# Patient Record
Sex: Male | Born: 1945
Health system: Southern US, Community
[De-identification: ages and names within clinical notes are randomized; demographics above are authoritative.]

## PROBLEM LIST (undated history)

## (undated) ENCOUNTER — Ambulatory Visit: Source: Home / Self Care

## (undated) DIAGNOSIS — G4733 Obstructive sleep apnea (adult) (pediatric): Secondary | ICD-10-CM

## (undated) DIAGNOSIS — I82409 Acute embolism and thrombosis of unspecified deep veins of unspecified lower extremity: Secondary | ICD-10-CM

## (undated) DIAGNOSIS — K859 Acute pancreatitis without necrosis or infection, unspecified: Secondary | ICD-10-CM

## (undated) DIAGNOSIS — I50810 Right heart failure, unspecified: Secondary | ICD-10-CM

## (undated) DIAGNOSIS — I272 Pulmonary hypertension, unspecified: Secondary | ICD-10-CM

## (undated) DIAGNOSIS — R0600 Dyspnea, unspecified: Secondary | ICD-10-CM

## (undated) DIAGNOSIS — K219 Gastro-esophageal reflux disease without esophagitis: Secondary | ICD-10-CM

## (undated) DIAGNOSIS — I10 Essential (primary) hypertension: Secondary | ICD-10-CM

## (undated) HISTORY — DX: Essential (primary) hypertension: I10

## (undated) HISTORY — PX: COLONOSCOPY: SHX174

## (undated) HISTORY — DX: Acute embolism and thrombosis of unspecified deep veins of unspecified lower extremity: I82.409

## (undated) HISTORY — DX: Obstructive sleep apnea (adult) (pediatric): G47.33

## (undated) HISTORY — PX: OTHER SURGICAL HISTORY: SHX169

## (undated) HISTORY — PX: LUMBAR LAMINECTOMY: SHX95

## (undated) HISTORY — DX: Right heart failure, unspecified: I50.810

## (undated) HISTORY — DX: Pulmonary hypertension, unspecified: I27.20

---

## 1996-07-28 HISTORY — PX: KNEE SURGERY: SHX244

## 1999-04-05 ENCOUNTER — Encounter: Payer: Self-pay | Admitting: Neurological Surgery

## 1999-04-09 ENCOUNTER — Inpatient Hospital Stay (HOSPITAL_COMMUNITY): Admission: RE | Admit: 1999-04-09 | Discharge: 1999-04-16 | Payer: Self-pay | Admitting: Neurological Surgery

## 1999-04-09 ENCOUNTER — Encounter: Payer: Self-pay | Admitting: Neurological Surgery

## 1999-04-09 HISTORY — PX: BACK SURGERY: SHX140

## 1999-04-10 ENCOUNTER — Encounter: Payer: Self-pay | Admitting: Neurological Surgery

## 1999-05-29 ENCOUNTER — Encounter: Payer: Self-pay | Admitting: Neurological Surgery

## 1999-05-29 ENCOUNTER — Encounter: Admission: RE | Admit: 1999-05-29 | Discharge: 1999-05-29 | Payer: Self-pay | Admitting: Neurological Surgery

## 1999-06-28 ENCOUNTER — Encounter: Payer: Self-pay | Admitting: Neurological Surgery

## 1999-06-28 ENCOUNTER — Encounter: Admission: RE | Admit: 1999-06-28 | Discharge: 1999-06-28 | Payer: Self-pay | Admitting: Neurological Surgery

## 1999-08-07 ENCOUNTER — Encounter: Admission: RE | Admit: 1999-08-07 | Discharge: 1999-08-07 | Payer: Self-pay | Admitting: Neurological Surgery

## 1999-08-07 ENCOUNTER — Encounter: Payer: Self-pay | Admitting: Neurological Surgery

## 1999-08-20 ENCOUNTER — Encounter: Admission: RE | Admit: 1999-08-20 | Discharge: 1999-11-18 | Payer: Self-pay | Admitting: Neurological Surgery

## 2000-02-12 ENCOUNTER — Encounter: Admission: RE | Admit: 2000-02-12 | Discharge: 2000-02-12 | Payer: Self-pay | Admitting: Neurological Surgery

## 2000-02-12 ENCOUNTER — Encounter: Payer: Self-pay | Admitting: Neurological Surgery

## 2000-05-06 ENCOUNTER — Encounter: Admission: RE | Admit: 2000-05-06 | Discharge: 2000-05-06 | Payer: Self-pay | Admitting: Neurological Surgery

## 2000-05-06 ENCOUNTER — Encounter: Payer: Self-pay | Admitting: Neurological Surgery

## 2001-03-08 ENCOUNTER — Ambulatory Visit (HOSPITAL_COMMUNITY): Admission: RE | Admit: 2001-03-08 | Discharge: 2001-03-08 | Payer: Self-pay | Admitting: Family Medicine

## 2001-03-08 ENCOUNTER — Encounter: Payer: Self-pay | Admitting: Family Medicine

## 2002-12-12 ENCOUNTER — Encounter: Admission: RE | Admit: 2002-12-12 | Discharge: 2002-12-12 | Payer: Self-pay | Admitting: Family Medicine

## 2005-02-07 ENCOUNTER — Encounter (INDEPENDENT_AMBULATORY_CARE_PROVIDER_SITE_OTHER): Payer: Self-pay | Admitting: Specialist

## 2005-02-07 ENCOUNTER — Ambulatory Visit (HOSPITAL_COMMUNITY): Admission: RE | Admit: 2005-02-07 | Discharge: 2005-02-07 | Payer: Self-pay | Admitting: *Deleted

## 2005-06-09 ENCOUNTER — Ambulatory Visit: Payer: Self-pay | Admitting: Pulmonary Disease

## 2005-06-12 ENCOUNTER — Ambulatory Visit (HOSPITAL_BASED_OUTPATIENT_CLINIC_OR_DEPARTMENT_OTHER): Admission: RE | Admit: 2005-06-12 | Discharge: 2005-06-12 | Payer: Self-pay | Admitting: Pulmonary Disease

## 2005-06-24 ENCOUNTER — Ambulatory Visit: Payer: Self-pay | Admitting: Pulmonary Disease

## 2005-06-25 ENCOUNTER — Ambulatory Visit: Payer: Self-pay | Admitting: Pulmonary Disease

## 2005-06-30 ENCOUNTER — Ambulatory Visit (HOSPITAL_COMMUNITY): Admission: RE | Admit: 2005-06-30 | Discharge: 2005-06-30 | Payer: Self-pay | Admitting: Family Medicine

## 2005-08-12 ENCOUNTER — Ambulatory Visit: Payer: Self-pay | Admitting: Pulmonary Disease

## 2006-09-24 DIAGNOSIS — G4733 Obstructive sleep apnea (adult) (pediatric): Secondary | ICD-10-CM | POA: Insufficient documentation

## 2006-09-24 DIAGNOSIS — F329 Major depressive disorder, single episode, unspecified: Secondary | ICD-10-CM

## 2006-09-24 DIAGNOSIS — M545 Low back pain: Secondary | ICD-10-CM | POA: Insufficient documentation

## 2007-07-24 ENCOUNTER — Inpatient Hospital Stay (HOSPITAL_COMMUNITY): Admission: EM | Admit: 2007-07-24 | Discharge: 2007-07-27 | Payer: Self-pay | Admitting: Emergency Medicine

## 2007-07-27 ENCOUNTER — Ambulatory Visit: Payer: Self-pay | Admitting: Vascular Surgery

## 2008-03-23 ENCOUNTER — Encounter (INDEPENDENT_AMBULATORY_CARE_PROVIDER_SITE_OTHER): Payer: Self-pay | Admitting: Family Medicine

## 2008-03-23 ENCOUNTER — Ambulatory Visit: Payer: Self-pay | Admitting: Vascular Surgery

## 2008-03-23 ENCOUNTER — Ambulatory Visit (HOSPITAL_COMMUNITY): Admission: RE | Admit: 2008-03-23 | Discharge: 2008-03-23 | Payer: Self-pay | Admitting: Family Medicine

## 2008-03-24 ENCOUNTER — Inpatient Hospital Stay (HOSPITAL_COMMUNITY): Admission: EM | Admit: 2008-03-24 | Discharge: 2008-03-30 | Payer: Self-pay | Admitting: Emergency Medicine

## 2008-10-30 ENCOUNTER — Ambulatory Visit: Payer: Self-pay | Admitting: Surgery

## 2009-03-20 ENCOUNTER — Encounter: Admission: RE | Admit: 2009-03-20 | Discharge: 2009-03-20 | Payer: Self-pay | Admitting: Family Medicine

## 2010-04-14 ENCOUNTER — Emergency Department (HOSPITAL_COMMUNITY): Admission: EM | Admit: 2010-04-14 | Discharge: 2010-04-14 | Payer: Self-pay | Admitting: Emergency Medicine

## 2010-05-10 ENCOUNTER — Encounter: Admission: RE | Admit: 2010-05-10 | Discharge: 2010-05-10 | Payer: Self-pay | Admitting: Family Medicine

## 2010-05-23 ENCOUNTER — Encounter: Admission: RE | Admit: 2010-05-23 | Discharge: 2010-05-23 | Payer: Self-pay | Admitting: Family Medicine

## 2010-05-24 ENCOUNTER — Encounter: Admission: RE | Admit: 2010-05-24 | Discharge: 2010-05-24 | Payer: Self-pay | Admitting: Family Medicine

## 2010-06-06 ENCOUNTER — Ambulatory Visit (HOSPITAL_COMMUNITY)
Admission: RE | Admit: 2010-06-06 | Discharge: 2010-06-06 | Payer: Self-pay | Source: Home / Self Care | Admitting: Gastroenterology

## 2010-10-10 LAB — POCT CARDIAC MARKERS
CKMB, poc: 2.6 ng/mL (ref 1.0–8.0)
Myoglobin, poc: 235 ng/mL (ref 12–200)
Troponin i, poc: <0.05 ng/mL (ref 0.00–0.09)

## 2010-10-10 LAB — DIFFERENTIAL
Basophils Absolute: 0 10*3/uL (ref 0.0–0.1)
Basophils Relative: 0 % (ref 0–1)
Eosinophils Relative: 2 % (ref 0–5)
Monocytes Absolute: 0.5 10*3/uL (ref 0.1–1.0)
Monocytes Relative: 8 % (ref 3–12)

## 2010-10-10 LAB — COMPREHENSIVE METABOLIC PANEL
AST: 35 U/L (ref 0–37)
CO2: 27 mEq/L (ref 19–32)
Chloride: 106 mEq/L (ref 96–112)
Sodium: 138 mEq/L (ref 135–145)

## 2010-10-10 LAB — CBC
HCT: 41.2 % (ref 39.0–52.0)
RDW: 14.5 % (ref 11.5–15.5)
WBC: 6 10*3/uL (ref 4.0–10.5)

## 2010-10-10 LAB — D-DIMER, QUANTITATIVE: D-Dimer, Quant: 0.69 ug/mL-FEU — ABNORMAL HIGH (ref 0.00–0.48)

## 2010-12-10 NOTE — Op Note (Signed)
NAMELAVONNE, CASS                ACCOUNT NO.:  [id number]   MEDICAL RECORD NO.:  [id number]          PATIENT TYPE:  INP   LOCATION:  1313                         FACILITY:  [facility]   PHYSICIAN:  Ollen Gross. Vernell Morgans, M.D. DATE OF BIRTH:  Jan 07, 1946   DATE OF PROCEDURE:  07/25/2007  DATE OF DISCHARGE:                               OPERATIVE REPORT   PREOPERATIVE DIAGNOSIS:  Right gluteal abscess.   POSTOPERATIVE DIAGNOSIS:  Right gluteal abscess.   PROCEDURE:  I&D of right gluteal abscess.   SURGEON:  Ollen Gross. Vernell Morgans, M.D.   ANESTHESIA:  General endotracheal.   PROCEDURE:  After informed consent was obtained, the patient was brought  to the operating room and placed in the supine position on operating  room table.  After adequate induction of general anesthesia, the patient  was placed in lithotomy position.  His perirectal area was then prepped  with Betadine and draped in the usual sterile manner.  The patient had a  large abscess on the right gluteal fold area.  This area was probed with  a hemostat.  Abscess cavity was able to be identified.  Cultures were  taken.  The cavity was then opened up sharply with the electrocautery.  The cavity was cleaned with gauze.  Hemostasis was achieved using the  Bovie electrocautery.  The wound was infiltrated with 0.25% Marcaine  with epinephrine.  The wound was then packed with a moistened 4x4 gauze,  and sterile dressings were applied.  The patient tolerated the procedure  well.  At the end of the case. all needle, sponge, and instrument counts  were correct.  The patient was then awakened and taken to the recovery  room in stable condition sanitation.      Ollen Gross. Vernell Morgans, M.D.  Electronically Signed     PST/MEDQ  D:  07/25/2007  T:  07/26/2007  Job:  [id number]

## 2010-12-10 NOTE — H&P (Signed)
Richard Sandoval, Richard Sandoval NO.:  [id number]   MEDICAL RECORD NO.:  [id number]          PATIENT TYPE:  EMS   LOCATION:  ED                           FACILITY:  [facility]   PHYSICIAN:  Deirdre Peer. Polite, M.D. DATE OF BIRTH:  09-13-1945   DATE OF ADMISSION:  07/23/2007  DATE OF DISCHARGE:                              HISTORY & PHYSICAL   CHIEF COMPLAINT:  Nausea and vomiting.   HISTORY OF PRESENT ILLNESS:  A 66 year old male with a history of  obesity, sleep apnea presents to the ED with the above complaint of  nausea and vomiting x2 days. The patient stated he was in his usual  health until approximately 4-5 days ago he had upper respiratory  symptoms as if he had a cold. He started to feel a little better there  before he ate Christmas dinner and thinks he may have ate a little too  much. Since then he has been having nausea and vomiting x2 days. He  denied any fever or chills, denied any blood in the emesis. No other  family members sick. He has been unable to keep much down other than  ginger ale. He has noticed a slight change in his stool. Also of note,  he had a boil on his right buttocks which has been there for 4 days. He  denies having any boils of MRSA in the past. In the ED, he was  evaluated, the patient was afebrile, pulse 111, respiratory rate of 22,  sat 96%. The last order showed a white count of 14, 78% neutrophils,  BMET within normal limits. The patient did not have any abdominal series  or UA there or was not ID. Medicine was called for admission. At the  time of my evaluation, the patient was alert and oriented in no apparent  distress. He still feels a little queasy with significant tenderness  around the boil in his right gluteal area.   PAST MEDICAL HISTORY:  As stated above.   MEDICATIONS ON ADMISSION:  None.   SOCIAL HISTORY:  Negative for tobacco, alcohol or drugs.   PAST SURGICAL HISTORY:  Low back surgery in 2000, knee surgery in 1991.   ALLERGIES:  PENICILLIN.   FAMILY HISTORY:  Noncontributory.   REVIEW OF SYSTEMS:  As stated in the HPI.   PHYSICAL EXAMINATION:  Temperature 98.7, blood pressure 162/98, pulse  111, respiratory rate of 22, sat 96%.  HEENT:  Unremarkable.  CHEST:  Clear without rales, rhonchi or rub.  CARDIOVASCULAR:  Regular, S1, S2. No S3 appreciated.  ABDOMEN:  Soft, nontender, no reproducible pain.  EXTREMITIES:  No edema. In the right gluteal area the patient does have  a boil/abscess approximately 2.5 x 3 cm.  NEUROLOGIC:  Nonfocal.   ASSESSMENT:  1. Nausea and vomiting.  2. Recent upper respiratory infection.  3. Leukocytosis.  4. White gluteal boil/abscess.  5. Obesity.  6. Sleep apnea.   I recommend the patient be admitted to a medicine floor bed. He will be  provided with analgesia. He will have followup CBC. Will start the  patient on  antibiotics for boil and determine in a.m. if I&D is  required. Will obtain an abdominal series as well as a UA, C&S. Will  make further recommendations after review of the above studies.      Deirdre Peer. Polite, M.D.  Electronically Signed     RDP/MEDQ  D:  07/24/2007  T:  07/24/2007  Job:  [id number]

## 2010-12-10 NOTE — Consult Note (Signed)
NAMEMarland Kitchen  EARNIE, BECHARD NO.:  [id number]   MEDICAL RECORD NO.:  [id number]          PATIENT TYPE:  INP   LOCATION:  1313                         FACILITY:  [facility]   PHYSICIAN:  Lennie Muckle, MD      DATE OF BIRTH:  Oct 01, 1945   DATE OF CONSULTATION:  07/24/2007  DATE OF DISCHARGE:                                 CONSULTATION   Richard Sandoval is a 65 year old male who was admitted to the medicine  service on July 23, 2007, due to nausea and vomiting for 2 days.  Apparently during his hospitalization, he had complaints of a boil on  his right buttock which had been present for approximately 4 days.  He  has had no previous problems with lesions in the past.  He has been  treated with doxycycline 100 mg p.o. b.i.d.  It was felt today that he  may receive benefit from incision and drainage of the area.  Apparently  he had a spontaneous rupture of the area today with warm compresses and  feels much better at the time of my visit.  He has had no fevers or  chills and his white count is mildly elevated at 14.8.   PAST MEDICAL HISTORY:  1. Sleep apnea.  2. Morbid obesity.   He takes no medications at home.   SOCIAL HISTORY:  No tobacco or alcohol use.   PAST SURGICAL HISTORY:  1. Low back surgery in 2000.  2. Knee surgery in 1991.   ALLERGIES:  PENICILLIN.   REVIEW OF SYSTEMS:  As per the patient's chart.  No pertinent positives  are seen.   PHYSICAL EXAMINATION:  GENERAL:  A pleasant white male in no acute  distress, laying in bed.  He does have his BiPAP machine on.  VITAL SIGNS:  Temperature is 97.5, blood pressure 158/95, O2 sat is 98%.  MUSCULOSKELETAL:  Focused examination of the gluteal area on the right:  There is an approximately 6-cm area of induration on the gluteal fold.  There is a smaller area approximately 2-cm in size with some skin  blistering with loose skin.  There is area of fluctuance that I can  ascertain at the present time.  There is a  small amount of drainage on a  gauze pad which has been placed over the area.  He is tender to  palpation over the vicinity.  There is no notable calor.   ASSESSMENT:  Left gluteal abscess with spontaneous rupture being covered  with doxycycline.   PLAN:  I think since Richard Sandoval has had spontaneous rupture of the  lesion and feels better that nothing further needs to be done at the  present time.  The area does continue to have some induration which  should get better with time.  We will re-evaluate in the morning to see if anything has changed or if  he does need incision and drainage but I believe fully that he will  recover from this without any further intervention needed.   It has been a pleasure being able to services in the care of  Richard Sandoval.      Lennie Muckle, MD  Electronically Signed     ALA/MEDQ  D:  07/24/2007  T:  07/25/2007  Job:  [id number]

## 2010-12-10 NOTE — Assessment & Plan Note (Signed)
OFFICE VISIT   Richard Sandoval, Richard Sandoval  DOB:  Nov 26, 1945                                       10/30/2008  CHART#:[id number]   REASON FOR VISIT:  Swelling status post DVT.   HISTORY:  This is a 65 year old gentleman I am seeing at the request of  Laurann Montana for evaluation of stasis ulcer on his leg.  Back in  October 2009, the patient developed a left leg deep vein thrombosis.  The patient stated that he had been in an prolonged sitting position on  a car ride that was likely the etiology of his DVT.  He had subsequently  been placed on Coumadin.  He did develop a staph infection and open  wound which has now healed.  He does complain of some swelling in his  left leg.  At the end of the day he says it is three times the size of  his right leg.   PAST MEDICAL HISTORY:  Significant for obesity.   PAST SURGICAL HISTORY:  Back fusion in 2000 and left knee surgery.   FAMILY HISTORY:  Negative for cardiovascular disease.   SOCIAL HISTORY:  He is married.  He is retired.  Does not smoke.  Has  never smoked.  He does not drink alcohol.   REVIEW OF SYSTEMS:  GENERAL:  He has no fevers, chills, he has a history  of weight gain.  He now weighs 430 pounds.  CARDIAC:  Positive for chest tightness, shortness of breath when lying  flat, shortness of breath with exertion.  PULMONARY:  Positive for wheezing.  GI:  Negative.  GU:  Frequent urination.  VASCULAR:  Positive for pain in legs when walking and when lying flat.  History of blood clot in his vein.  NEURO:  Positive for headaches.  ORTHO:  Positive for arthritis, joint pain, muscle pain.  PSYCH:  Positive for depression.  ENT:  Negative.  HEME:  Negative.   MEDICATIONS:  Include tramadol, Coumadin, doxycycline.   ALLERGIES:  PENICILLIN.   PHYSICAL EXAMINATION:  His blood pressure is 148/87, pulse 60.  General:  He is an obese gentleman in no acute distress.  Cardiovascular:  Regular  rate and rhythm,  respirations nonlabored.  Abdomen:  Obese.  Extremities:  Warm and well-perfused.  He has palpable pedal pulses.  There are no active ulcerations or open sores.  He does have  hyperpigmentation along the gaiter area of his left leg.   DIAGNOSTIC TESTS:  Duplex was performed today which revealed no evidence  of DVT in the femoral popliteal venous system.  We were unable to  adequately visualize his calf veins.   ASSESSMENT AND PLAN:  Left leg deep venous thrombosis.   PLAN:  The patient's most likely etiology for his DVT is his prolonged  sitting on a car ride.  For that reason he would qualify for 6 months of  Coumadin therapy.  Ultrasound today does not show evidence of DVT.  The  patient does have swelling in his left leg.  We were unable to evaluate  him for reflux today due to the size of his leg.  However, I do think he  would benefit from compression stockings.  I am writing him for 20-30 mm  compression.  I explained to him the significance of wearing these is  that they would help with  the swelling and they would also prevent  ulceration in the future.   Jorge Ny, MD  Electronically Signed   VWB/MEDQ  D:  10/30/2008  T:  10/31/2008  Job:  [id number]

## 2010-12-10 NOTE — Procedures (Signed)
DUPLEX DEEP VENOUS EXAM - LOWER EXTREMITY   INDICATION:  Venous insufficiency.   HISTORY:  Edema:  Yes.  Trauma/Surgery:  No.  Pain:  No.  PE:  No.  Previous DVT:  History of left popliteal DVT found on a study done at  [facility] on 03/23/2008.  Anticoagulants:  Other:  Morbid obesity.   DUPLEX EXAM:                CFV   SFV   PopV  PTV    GSV                R  L  R  L  R  L  R   L  R  L  Thrombosis    o  o     o     o            o  Spontaneous   +  +     +     +            +  Phasic        +  +     +     +            +  Augmentation  +  +     +     +            +  Compressible  +  +     +     +            +  Competent     +  +           +   Legend:  + - yes  o - no  p - partial  D - decreased   IMPRESSION:  No evidence of deep vein thrombosis noted in the left  femoral-popliteal venous system.   Unable to adequately visualize the calf veins of the left lower  extremity due to patient body habitus and leg edema.        _____________________________  V. Charlena Cross, MD   CH/MEDQ  D:  10/31/2008  T:  10/31/2008  Job:  [id number]

## 2010-12-10 NOTE — Discharge Summary (Signed)
Richard Sandoval, Richard Sandoval                ACCOUNT NO.:  [id number]   MEDICAL RECORD NO.:  [id number]          PATIENT TYPE:  INP   LOCATION:  5522                         FACILITY:  [facility]   PHYSICIAN:  Kela Millin, M.D.DATE OF BIRTH:  13-Jul-1946   DATE OF ADMISSION:  03/23/2008  DATE OF DISCHARGE:  03/30/2008                               DISCHARGE SUMMARY   DISCHARGE DIAGNOSES:  1. Left lower extremity deep vein thrombosis.  2. Obstructive sleep apnea, on continuous positive airway pressure.  3. Morbid obesity.  4. History of right gluteal abscess, status post incision and drainage      in 2008 and 2009 per Dr. Carolynne Edouard.  5. History of low back pain, status post back surgery in 2000.  6. Status post left knee surgery in 1991.   Lower extremity Doppler ultrasound - DVT present in the left popliteal  vein.  No superficial thrombus or Baker's cyst noted.   BRIEF HISTORY:  The patient is a 65 year old black male with the above-  listed medical problems who presented with complaints of cramping pain  in his left calf x2 days, worsening to the point where he could not  walk.  He reported swelling, redness and a purplish discoloration to  that left lower extremity.  He denied fevers, chills, chest pain,  shortness of breath, hemoptysis, nausea, vomiting, diarrhea, hematemesis  and no hematochezia.  He admitted to a chronic nonproductive cough.  The  patient also reported that 1 day prior to admission he had a taken a 3-  1/2-hour drive to [facility] and back for a total of 7 hours and noted  worsening of that lower extremity swelling and pain the next day.  He  was initially seen at his primary care physician's office, was sent to  the vascular lab for Doppler ultrasound, and the results are as stated  above.  Following this, he was seen in the emergency room, and lab work  revealed a white cell count of 11.9 with a hemoglobin of 13.4, platelet  count of 267.  He was admitted for further  evaluation and management.   Please see the full admission history and physical dictated by Dr.  Ramiro Harvest for the details of the admission physical exam as well  as the laboratory data.   HOSPITAL COURSE:  1. Left lower extremity deep vein thrombosis - it was noted upon      admission that this was the patient's first episode of DVT and that      the likely etiology was his long car ride.  He was started on      Lovenox and Coumadin upon admission.  Due to his obesity, he needed      high doses of Coumadin, and he was monitored in the hospital on the      Coumadin and Lovenox until his INR became therapeutic.  His Lovenox      will be discontinued at this time, his INR is 2.2, and he will be      discharged on Coumadin.  He is to have a PT/INR done on April 01, 2008, and is to have the results called to his primary care      physician for further adjustment of his Coumadin dose as      appropriate.  Again, it is his first DVT and Coumadin for 6 months      is recommended at this time with followup and monitoring with his      primary care physician.  The patient's left lower extremity edema      and redness are significantly improved as well as the pain, and he      has remained hemodynamically stable throughout his hospital stay.  2. Leukocytosis - the patient was noted to have a mild leukocytosis of      11.9 on admission, and it was thought to be reactive as there was      no source of infection found.  He had followup CBCs done while in      the hospital and his last white cell count prior to discharge was      8.0.  He has remained afebrile and hemodynamically stable.  3. Obstructive sleep apnea - the patient was maintained on his CPAP      during his hospital stay and is to continue this upon discharge.  4. Morbid obesity.   DISCHARGE MEDICATIONS:  1. Coumadin 12.5 mg p.o. q.p.m. or as directed per PCP.  2. Percocet 1 to 2 tablets q.4-6 h p.r.n.  3. Senokot  p.r.n. constipation.   FOLLOWUP CARE:  1. The patient is to go to the [facility] outpatient lab for PT/INR on      April 01, 2008, for results to be called to PCP for adjustment      of Coumadin dose as appropriate.  2. Dr. Holley Bouche - the patient to call for appointment next week.   DISCHARGE CONDITION:  Improved/stable.      Kela Millin, M.D.  Electronically Signed     ACV/MEDQ  D:  03/30/2008  T:  03/30/2008  Job:  [id number]   cc:   Holley Bouche, M.D.

## 2010-12-10 NOTE — Discharge Summary (Signed)
Richard Sandoval, STUTSMAN NO.:  [id number]   MEDICAL RECORD NO.:  [id number]          PATIENT TYPE:  INP   LOCATION:  1313                         FACILITY:  [facility]   PHYSICIAN:  Ramiro Harvest, MD    DATE OF BIRTH:  1946-01-02   DATE OF ADMISSION:  07/24/2007  DATE OF DISCHARGE:  07/27/2007                               DISCHARGE SUMMARY   DATE OF BIRTH:  1946/01/05   PATIENT'S PRIMARY CARE PHYSICIAN:  Dr. Holley Bouche of [facility]   SURGEON:  Ollen Gross. Vernell Morgans, M.D. of [facility].   DISCHARGE DIAGNOSIS:  1. Right gluteal abscess, status post I&D on July 25, 2007.  2. Leukocytosis secondary to problem number one.  3. Left calf tenderness, likely musculoskeletal.  4. Obstructive sleep apnea.  5. Nausea and vomiting, likely secondary to problem #1.   DISCHARGE MEDICATIONS:  1. Doxycycline 100 mg p.o. b.i.d. times 6 days.  2. Percocet 5/325 1-2 tablets p.o. q.4 h p.r.n. pain.   DISPOSITION AND FOLLOWUP:  The patient will be discharged home with Home  Health.  The patient will need wet-to-dry dressing changes once a day  and as needed.  The patient is to call to schedule a follow-up  appointment with Dr. Carolynne Edouard in one week.  The patient to also followup  with a PCP as scheduled.   PROCEDURES DONE:  1. A I&D was done on July 25, 2007 by Dr. Carolynne Edouard.  2. Abdominal films were done on July 24, 2007 which showed no      acute abdominal abnormalities.  3. Chest x-ray was done on December 27 that showed mild cardiomegaly,      no acute cardiopulmonary disease, stable since December 2006.  4. Left lower extremity venous duplex was performed on July 27, 2007.   CONSULTATIONS:  A general surgery consult was done on July 24, 2007.  The patient was seen by Dr. Freida Busman.   BRIEF ADMISSION HISTORY AND PHYSICAL:  [name] is a 65 year old male  with a history of obesity, sleep apnea who presented to the ED with  complaints of  nausea and vomiting times two days.  The patient has  stated that he was in his usual health until approximately 4-5 days  prior to admission.  He had upper respiratory symptoms.  He started to  feel a little bit better before he ate Christmas dinner and then thinks  he may have eaten a little too much.  Since then he has been having  nausea and vomiting times two days.  Denied any fever or chills.  Denied  any blood in the emesis.  No other family members were sick.  The  patient had been unable to keep anything down other than ginger ale.  The patient had also noticed a slight change in his stool.  Also of  note, the patient had a boil on his right buttock that had been there  for four days.  He denied having any boils of MRSA in the past.   In the ED he was evaluated.  The patient was afebrile, pulse of 111,  respiratory rate 22, satting 96% and his CBC showed a white count of 14,  78% neutrophils. A BMET was within normal limits.  The patient did not  have any abdominal series or UA at that time and was not on any ID  medications.  Medicine was called for admission.  At the time of  evaluation the patient was alert and oriented, in no apparent distress.  The patient felt a little bit queasy with significant tenderness around  the boil on his right gluteal region.   PHYSICAL EXAMINATION:  VITAL SIGNS:  Temperature 98.7, blood pressure  162/98, pulse 111, respiratory rate 22, satting 96% on room air.  HEENT: Normocephalic, atraumatic.  Pupils equal, round and reactive to  light.  Extraocular movements intact.  Oropharynx was clear, dry and no  lesions.  No exudates.  RESPIRATORY:  Lungs are clear to auscultation bilaterally.  No rales,  rhonchi or rubs.  CARDIOVASCULAR:  Regular rate, rhythm.  No murmurs, rubs or gallops.  ABDOMEN:  Abdomen was obese, soft, nontender, nondistended, positive  bowel sounds.  EXTREMITIES: No edema. In the right gluteal area the patient did have a   bulbar abscess measuring approximately 2.5 to 3 cm. NEUROLOGICAL:  The  patient was alert and oriented times three.  Cranial nerves II-XII  grossly intact.  No focal deficits.   ADMISSION LABORATORY DATA:  CBC: White count of 14.1, hemoglobin 13.3,  hematocrit 40.5, ANC of 11.  Basic metabolic profile: Sodium 135,  potassium 3.8, chloride 102, bicarb 24, glucose 130, BUN 13, creatinine  1.06, calcium of 9.0.  Point of care cardiac markers:  CK-MB of 5.5,  troponin-I less than 0.05, myoglobin 497.  Urinalysis: Yellow, clear,  specific gravity 1.010, pH of 6.  Urine glucose negative, bilirubin  negative, ketones negative, blood trace, protein negative, urobilinogen  1, nitrite negative, leukocytes negative.  Urine microscopy 0 to 2.   HOSPITAL COURSE:  1. Right gluteal abscess status post I&D:  The patient was admitted      with a right gluteal abscess.  The patient was in pain on      admission.  It was not draining.  The patient was given some      analgesic pain medication, put on IV fluids.  Warm compresses were      then applied to his right gluteal area which started to have a      little bit of drainage.  It had a deep induration around the site      open wound.  General Surgery was consulted.  The patient was seen.      The patient was taken to the OR for I&D on July 25, 2007 per      Dr. Carolynne Edouard.  On admission the patient had been placed on doxycycline.      Doxycycline was continued during the hospitalization.  Wound      cultures were obtained from the area during the I&D which had come      back as a community acquired MRSA.  The patient was continued on      doxy for antibiotic coverage of 10 days.  The patient is to      followup in one week with Dr. Carolynne Edouard.  The patient will continue wet-      to-dry dressing changes once a day or as needed with the home      health nurse coming to do that.  The  patient was afebrile      throughout the hospitalization.  The patient's white  count trended      down with treatment and I&D and was within normal limits on the day      of discharge.  The patient was discharged in stable and improved      condition.  2. Leukocytosis: This was felt secondary to problem #1.  Wound      cultures were obtained which grew community-acquired MRSA.  The      patient remained afebrile throughout the hospitalization.  The      patient was maintained on doxycycline.  The patient will be      continued on doxy for a total of 10 days of antibiotics.  On the      day of discharge the patient's leukocytosis had resolved and the      patient was discharged in stable and improved condition.  3. Left calf tenderness:  One day prior to discharge the patient had      complained of some left calf tenderness and as such a duplex      ultrasound was obtained to rule out a DVT.  A duplex ultrasound was      done on July 27, 2007 which was negative for DVT.  4. Obstructive sleep apnea, stable:  The patient was maintained on C-      PAP during the hospitalization.   On day of discharge the patient was discharged in stable and improved  condition.  Vital signs on day of discharge:  Temperature 97.4, pulse of  68, blood pressure 135/81, satting 96%.  CBC:  White count 7.5,  hemoglobin 11.8, platelets 266, hematocrit 35.6, sodium 140, potassium  3.7, chloride 107, bicarb 30, BUN 7, creatinine 0.95, glucose 112,  calcium of 8.4.   It had been a pleasure taking care of Mr. Savas Elvin.      Ramiro Harvest, MD  Electronically Signed     DT/MEDQ  D:  07/27/2007  T:  07/28/2007  Job:  [id number]   cc:   Ollen Gross. Vernell Morgans, M.D.  [address]  [place]  [place] [address]   Holley Bouche, M.D.  Fax: [phone]

## 2010-12-10 NOTE — H&P (Signed)
NAMEMarland Kitchen  SRICHARAN, LACOMB NO.:  [id number]   MEDICAL RECORD NO.:  [id number]          PATIENT TYPE:  OBV   LOCATION:  5522                         FACILITY:  [facility]   PHYSICIAN:  Ramiro Harvest, MD    DATE OF BIRTH:  04-May-1946   DATE OF ADMISSION:  03/23/2008  DATE OF DISCHARGE:                              HISTORY & PHYSICAL   PRIMARY CARE PHYSICIAN:  Holley Bouche, MD of [facility].   HISTORY OF PRESENT ILLNESS:  Richard Sandoval is a 65 year old African  American male with history of obstructive sleep apnea, obesity, right  gluteal abscess status post I&D x2 in December 2008 and January 2009,  history of low back surgery in 2000, and left knee surgery in 1991, who  presents to the ED from the vascular lab with a left lower extremity  DVT.  The patient has been having severe worsening left lower extremity  pain, which is burning in nature with some cramping at the calf over the  last 2 days to the point where he could not walk.  The patient does  endorse some warmth, edema, erythema, and a purplish discoloration to  his left lower extremity.  The patient denies any fever.  No chills, no  chest pain, no shortness of breath, no hemoptysis, no nausea, no  vomiting, no diarrhea, no constipation, no melena, no hematemesis, no  hematochezia, and no focal neurological symptoms.  The patient does have  a chronic nonproductive cough and a chronic shortness of breath with  exertion, which has been unchanged.  The patient also denies any  dysuria, any headaches, and no urinary discharge as well.  The patient  states that 1 day prior to admission, he took a 3-1/2-hour car drive to  [place] each way and noted severe worsening of his lower extremity  pain.  The patient stated that on the day of admission, the pain started  to travel up his left thigh.  The patient denies any inactivity, no long  air travel, no recent surgery, no history of cancer.  No prior history  of  DVT or PE.  No family history of DVT or PE.  The patient presented to  his PCP's office and was sent over to the vascular lab for lower  extremity Dopplers and noted to have a left lower extremity DVT.  The  patient was sent to the ED.  In the ED, a comprehensive metabolic  profile with a bilirubin of 1.4, otherwise unremarkable.  CBC with a  white count of 11.9, hemoglobin of 13.4, platelets of 267, and ANC of  8.4.  Coags were within normal limits.  The patient was given some  Dilaudid and Zofran as well as full dose Lovenox and warfarin in the  emergency room.  We were called to admit the patient for further  evaluation and management.   ALLERGIES:  PENICILLIN.   PAST MEDICAL HISTORY:  1. Obstructive sleep apnea.  2. Morbid obesity.  3. Right gluteal abscess, status post I&D x2, first one on July 25, 2007, per Dr. Carolynne Edouard, and  the second one in January 2009, as an      outpatient.  The patient was also status post low back surgery in      2000, and also status post left knee surgery in 1991.   HOME MEDICATIONS:  Include Tylenol Arthritis as needed.   SOCIAL HISTORY:  The patient lives in [place].  He is married and  denies any tobacco use.  No alcohol use.  No IV drug use.  He has a  daughter who is age 55 and healthy.   FAMILY HISTORY:  Mother deceased at age 37 from lung cancer.  Father  alive at age 26 with kidney disease.  The patient has two sisters and  one brother, all of whom are healthy.   REVIEW OF SYSTEMS:  As per HPI, otherwise negative.   PHYSICAL EXAMINATION:  VITAL SIGNS:  Temperature is 100.2, blood  pressure 137/76, pulse of 84, respiratory rate 18, and sating 100% on  room air.  GENERAL:  The patient is obese gentleman in mild discomfort.  HEENT: Normocephalic and atraumatic.  Pupils are equal, round, and  reactive to light.  Extraocular movements are intact.  Oropharynx is  clear.  No lesions.  No exudates.  NECK: Supple.  No lymphadenopathy.   RESPIRATORY:  Lungs are clear to auscultation bilaterally.  No wheezes,  no rhonchi, and no crackles.  CARDIOVASCULAR:  Regular rate and rhythm.  No murmurs, rubs, or gallops.  ABDOMEN:  Obese, soft, nontender, and nondistended.  Positive bowel  sounds.  No rebound and no guarding.  EXTREMITIES: No clubbing and no cyanosis.  Left lower extremity with a 2-  3+ edema.  Positive erythema.  Positive warmth and tenderness to  palpation in the calf region.  Positive pain on dorsiflexion in the calf  region.  Left calf measures 54 cm and right calf 50 cm, 19 cm below the  tibial tuberosity.  NEUROLOGICAL:  The patient is alert and oriented x3.  Cranial nerves II-  XII are grossly intact.  No focal deficits.   LABORATORY DATA:  Sodium 138, potassium 4.2, chloride 104, bicarbonate  28, BUN 11, creatinine 1.09, and glucose of 102.  Bilirubin 1.4, alk  phosphatase 61, AST 27, ALT 21, protein 7.3, albumin 3.5, and calcium of  9.2.  PTT 31, PT 13.6, and INR 1.0.  CBC; white count 11.9, hemoglobin  13.4, platelets of 267, hematocrit of 41.1, and ANC of 8.4.  Venous  Dopplers with a left lower extremity DVT in the popliteal vein.  No  superficial thrombus or Baker's cyst noted.  Right lower extremity was  negative for DVT.   ASSESSMENT AND PLAN:  Richard Sandoval is a 65 year old obese gentleman with  history of obstructive sleep apnea who presents to the ED with a left  lower extremity pain, erythema, warmth and tenderness to palpation and  found to have a left lower extremity deep venous thrombosis.  1. Left lower extremity deep venous thrombosis, questionable etiology,      may have been secondary to his long car ride, however, ride was      just 1 day prior to admission.  The patient does not have any      history of any recent surgeries, no inactivity, no family history      of DVT or PE.  No prior history of DVT.  We will check a chest x-      ray, check a UA with cultures and sensitivities.  We  will treat  with Coumadin with Lovenox as a bridge for goal INR of 2-3.  We      will overlap the Lovenox with the Coumadin whilst in the      therapeutic range of 2-3 for 48 hours.  As this is patient's first      DVT, it will likely need 6 months of treatment.  We will also pain      management and symptomatic treatment.  2. Leukocytosis, likely reactive leukocytosis secondary to left lower      extremity deep venous thrombosis versus an infectious etiology.  We      will check an urinalysis and a chest x-ray to rule out an      infectious etiology.  No need for antibiotics at this time.  We      will follow.  3. Obstructive sleep apnea.  CPAP at bedtime.  4. Obesity.  5. Prophylaxis, Protonix for GI prophylaxis.  Lovenox for DVT      prophylaxis.   It has been a pleasure taking care of Richard Sandoval.      Ramiro Harvest, MD  Electronically Signed    DT/MEDQ  D:  03/23/2008  T:  03/24/2008  Job:  [id number]   cc:   Holley Bouche, M.D.

## 2010-12-13 NOTE — Procedures (Signed)
NAMEMarland Kitchen  AUTREY, HUMAN NO.:  [id number]   MEDICAL RECORD NO.:  [id number]          PATIENT TYPE:  OUT   LOCATION:  SLEEP CENTER                 FACILITY:  [facility]   PHYSICIAN:  Marcelyn Bruins, M.D. [facility] DATE OF BIRTH:  02-09-1946   DATE OF STUDY:  06/12/2005                              NOCTURNAL POLYSOMNOGRAM   REFERRING PHYSICIAN:  Dr. Marcelyn Bruins.   DATE OF STUDY:  June 12, 2005.   INDICATION FOR STUDY:  Hypersomnia with sleep apnea. Patient has been  diagnosed with sleep apnea and returns for pressure optimization.   EPWORTH SCORE:  Was not filled out by the patient.   SLEEP ARCHITECTURE:  The patient had a total sleep time of 428 minutes with  no slow wave sleep and decreased REM. Sleep onset latency was normal and REM  onset was normal as well. Sleep efficiency was 90%.   RESPIRATORY DATA:  The patient was scheduled for a C-PAP titration for  pressure optimization. He was placed on a large comfort gel mask because his  mask was very old and outdated. Snoring was noted during the study but was  not quantified by the sleep technician. The patient was initiated on C-PAP  and ultimately titrated to 14 cm as an optimal pressure for both his  obstructive events and snoring.   OXYGEN DATA:  The patient had O2 desaturation as low as 84% prior to C-PAP  optimization.   CARDIAC DATA:  There were rare fusion beats, PACs, and PVCs. No clinically  significant cardiac arrhythmias.   MOVEMENT/PARASOMNIA:  There were small numbers of leg jerks with very little  sleep disruption.   IMPRESSION/RECOMMENDATIONS:  1.  Good control of previously diagnosed obstructive sleep apnea with 14 cm      of water pressure. C-PAP coupled with weight loss would be the best      treatment options for this patient.           ______________________________  Marcelyn Bruins, M.D. [facility]  Diplomate, American Board of Sleep  Medicine     KC/MEDQ  D:  06/23/2005 16:41:06  T:   06/24/2005 01:25:11  Job:  [id number]

## 2011-04-02 ENCOUNTER — Other Ambulatory Visit: Payer: Self-pay | Admitting: Family Medicine

## 2011-04-02 DIAGNOSIS — M5090 Cervical disc disorder, unspecified, unspecified cervical region: Secondary | ICD-10-CM

## 2011-04-05 ENCOUNTER — Ambulatory Visit
Admission: RE | Admit: 2011-04-05 | Discharge: 2011-04-05 | Disposition: A | Discharge status: Home / Self Care | Payer: Medicare Other | Source: Ambulatory Visit | Attending: Family Medicine | Admitting: Family Medicine

## 2011-04-05 DIAGNOSIS — M5090 Cervical disc disorder, unspecified, unspecified cervical region: Secondary | ICD-10-CM

## 2011-04-30 ENCOUNTER — Telehealth: Payer: Self-pay | Admitting: Pulmonary Disease

## 2011-04-30 LAB — PROTIME-INR
INR: 1.6 — ABNORMAL HIGH
INR: 2.2 — ABNORMAL HIGH
Prothrombin Time: 19.6 — ABNORMAL HIGH

## 2011-04-30 LAB — CBC
Hemoglobin: 13.1
MCV: 83.6
Platelets: 301
RBC: 4.7
WBC: 6.9
WBC: 8

## 2011-04-30 NOTE — Telephone Encounter (Signed)
Richard Sandoval, have you seen this? Please advise, thanks!

## 2011-04-30 NOTE — Telephone Encounter (Signed)
Called and spoke with Shanda Bumps at Dr. Verlee Rossetti office and requested she fax form to the triage fax #.

## 2011-04-30 NOTE — Telephone Encounter (Signed)
Received paperwork from Dr. Verlee Rossetti office and put in [facility] VIP folder.  FYI: pt hasn't seen KC since 08/12/2005.  I requested paper chart.

## 2011-04-30 NOTE — Telephone Encounter (Signed)
Please let pt and Dr. Verlee Rossetti nurse know that I have not seen him since 2007.   Richard Sandoval, he will need a consult for surgical clearance.  If there is no place for him, he may have to postpone his surgery.

## 2011-05-01 NOTE — Telephone Encounter (Signed)
Called and spoke with Shanda Bumps, Dr. Verlee Rossetti nurse and informed her of KC's response.  Jessica scheduled pt for a consult with KC on 10/11 at 10:30- pt to arrive at 10:15.  Shanda Bumps stated she will call pt to inform him of appt date and time.

## 2011-05-02 LAB — LIPASE, BLOOD: Lipase: 12

## 2011-05-02 LAB — CBC
HCT: 36.1 — ABNORMAL LOW
Hemoglobin: 11.4 — ABNORMAL LOW
Hemoglobin: 11.8 — ABNORMAL LOW
Hemoglobin: 12.6 — ABNORMAL LOW
Hemoglobin: 13.3
MCHC: 32.9
MCHC: 33
MCHC: 33.3
MCHC: 33.5
MCV: 82.2
MCV: 82.5
Platelets: 247
Platelets: 251
Platelets: 268
RBC: 4.37
RDW: 14.1
RDW: 14.9
RDW: 15
RDW: 15.3
WBC: 11.6 — ABNORMAL HIGH

## 2011-05-02 LAB — CULTURE, ROUTINE-ABSCESS

## 2011-05-02 LAB — BASIC METABOLIC PANEL
BUN: 13
BUN: 7
BUN: 9
CO2: 24
CO2: 24
CO2: 25
Calcium: 8.2 — ABNORMAL LOW
Calcium: 8.4
Calcium: 9
Chloride: 104
Creatinine, Ser: 0.95
Creatinine, Ser: 1.06
GFR calc Af Amer: >60
GFR calc Af Amer: >60
GFR calc non Af Amer: >60
Glucose, Bld: 112 — ABNORMAL HIGH
Glucose, Bld: 130 — ABNORMAL HIGH
Glucose, Bld: 92
Potassium: 3.7
Potassium: 3.8
Sodium: 135
Sodium: 140

## 2011-05-02 LAB — URINALYSIS, ROUTINE W REFLEX MICROSCOPIC
Glucose, UA: NEGATIVE
Ketones, ur: NEGATIVE
Leukocytes, UA: NEGATIVE
Nitrite: NEGATIVE
Protein, ur: NEGATIVE
Specific Gravity, Urine: 1.01
Urobilinogen, UA: 1

## 2011-05-02 LAB — ANAEROBIC CULTURE

## 2011-05-02 LAB — POCT CARDIAC MARKERS
CKMB, poc: 3.6
CKMB, poc: 5.5
Myoglobin, poc: 415
Myoglobin, poc: 497
Operator id: 4533
Operator id: 4533
Troponin i, poc: <0.05
Troponin i, poc: <0.05

## 2011-05-02 LAB — DIFFERENTIAL
Basophils Absolute: 0.5 — ABNORMAL HIGH
Eosinophils Absolute: 0.2
Eosinophils Relative: 2
Lymphs Abs: 3.1
Monocytes Relative: 11

## 2011-05-02 LAB — COMPREHENSIVE METABOLIC PANEL
ALT: 20
Albumin: 2.9 — ABNORMAL LOW
Calcium: 8.6
GFR calc Af Amer: >60
Glucose, Bld: 114 — ABNORMAL HIGH
Potassium: 3.5
Sodium: 135
Total Protein: 7

## 2011-05-07 ENCOUNTER — Encounter (HOSPITAL_COMMUNITY)
Admission: RE | Admit: 2011-05-07 | Discharge: 2011-05-07 | Disposition: A | Discharge status: Home / Self Care | Payer: 59 | Source: Ambulatory Visit | Attending: Neurological Surgery | Admitting: Neurological Surgery

## 2011-05-07 ENCOUNTER — Other Ambulatory Visit (HOSPITAL_COMMUNITY): Payer: Self-pay | Admitting: Neurological Surgery

## 2011-05-07 ENCOUNTER — Encounter: Payer: Self-pay | Admitting: Pulmonary Disease

## 2011-05-07 DIAGNOSIS — M5020 Other cervical disc displacement, unspecified cervical region: Secondary | ICD-10-CM

## 2011-05-07 LAB — SURGICAL PCR SCREEN: Staphylococcus aureus: NEGATIVE

## 2011-05-07 LAB — CBC
Hemoglobin: 14.6 g/dL (ref 13.0–17.0)
MCH: 29.3 pg (ref 26.0–34.0)
RBC: 4.99 MIL/uL (ref 4.22–5.81)

## 2011-05-07 LAB — BASIC METABOLIC PANEL
CO2: 24 mEq/L (ref 19–32)
Calcium: 9.4 mg/dL (ref 8.4–10.5)
Chloride: 105 mEq/L (ref 96–112)
Glucose, Bld: 106 mg/dL — ABNORMAL HIGH (ref 70–99)
Potassium: 4.3 mEq/L (ref 3.5–5.1)
Sodium: 140 mEq/L (ref 135–145)

## 2011-05-08 ENCOUNTER — Encounter: Payer: Self-pay | Admitting: Pulmonary Disease

## 2011-05-08 ENCOUNTER — Ambulatory Visit (INDEPENDENT_AMBULATORY_CARE_PROVIDER_SITE_OTHER): Payer: 59 | Admitting: Pulmonary Disease

## 2011-05-08 VITALS — BP 160/92 | HR 89 | Temp 97.9°F | Ht 70.0 in | Wt >= 6400 oz

## 2011-05-08 DIAGNOSIS — G4733 Obstructive sleep apnea (adult) (pediatric): Secondary | ICD-10-CM

## 2011-05-08 NOTE — Patient Instructions (Signed)
Will get you referred to a medical equipment company to get a new mask and have your cpap machine checked. Take your mask to the hospital, and respiratory therapy will provide a cpap machine with pressure 14cm. Will send a note to Dr. Danielle Dess, letting him know you are cleared for surgery, understanding you are at risk for breathing complications we discussed. Will follow you in hospital once we are notified your surgery is done. Work on weight loss once surgery is done. Will arrange followup with me to check on you once out of hospital

## 2011-05-08 NOTE — Progress Notes (Signed)
Subjective:    Patient ID: Richard Sandoval, male    DOB: [birth year], 65 y.o.   MRN: [id number]  HPI The patient is a 65 year old male who I've been asked to see for obstructive sleep apnea.  He is scheduled to have an anterior cervical fusion next week, and requires preop clearance.  The patient was diagnosed with severe sleep apnea over 20 years ago, and has been on CPAP since that time.  He saw me initially in 2006, where a CPAP titration study optimized his pressure to 14 cm of water.  I have not seen him since.  The patient states that he's been wearing CPAP regularly, and feels that he sleeps well with the device.  Unfortunately, he has not had any machine up-keep in many years, and has not had a mask change in many years as well.  He admits that he is having a lot of air leaks with his current aged mask.  Patient states that his machine seems to be working well.  The patient's weight is up 10 pounds since his first visit in 2006.  His Epworth Sleepiness Scale today is only 8.  Sleep Questionnaire: What time do you typically go to bed?( Between what hours) falls asleep at 12am in recliner and then goes to bed at 3 am How long does it take you to fall asleep? 15 mins How many times during the night do you wake up? 2 What time do you get out of bed to start your day? 0730 Do you drive or operate heavy machinery in your occupation? No How much has your weight changed (up or down) over the past two years? (In pounds) 20 lb (9.072 kg) Have you ever had a sleep study before? Yes If yes, location of study? [place] If yes, date of study? 05/2005 Do you currently use CPAP? Yes If so, what pressure? unsure Do you wear oxygen at any time? No     Review of Systems  Constitutional: Positive for unexpected weight change. Negative for fever.  HENT: Negative for ear pain, nosebleeds, congestion, sore throat, rhinorrhea, sneezing, trouble swallowing, dental problem, postnasal drip and sinus pressure.   Eyes:  Negative for redness and itching.  Respiratory: Positive for shortness of breath. Negative for cough, chest tightness and wheezing.   Cardiovascular: Positive for chest pain. Negative for palpitations and leg swelling.  Gastrointestinal: Negative for nausea and vomiting.  Genitourinary: Negative for dysuria.  Musculoskeletal: Positive for joint swelling.  Skin: Negative for rash.  Neurological: Negative for headaches.  Hematological: Does not bruise/bleed easily.  Psychiatric/Behavioral: Positive for dysphoric mood. The patient is not nervous/anxious.        Objective:   Physical Exam Constitutional:  Morbidly obese male, no acute distress  HENT:  Nares patent without discharge  Oropharynx without exudate, palate and uvula are thick and elongated.  Eyes:  Perrla, eomi, no scleral icterus  Neck:  Very large neck, No JVD, no TMG  Cardiovascular:  Normal rate, regular rhythm, no rubs or gallops.  No murmurs        Intact distal pulses but decreased.  Pulmonary :  Normal breath sounds, no stridor or respiratory distress   No rales, rhonchi, or wheezing  Abdominal:  Soft, nondistended, bowel sounds present.  No tenderness noted.   Musculoskeletal:  1-2+  lower extremity edema noted.  Lymph Nodes:  No cervical lymphadenopathy noted  Skin:  No cyanosis noted  Neurologic:  Alert, appropriate, moves all 4 extremities without obvious deficit.  Assessment & Plan:

## 2011-05-08 NOTE — Assessment & Plan Note (Signed)
The patient has a history of severe obstructive sleep apnea, however has done well with his CPAP device.  He obviously is going to need a new mask prior to his surgery, and I will have him update his CPAP machine and make sure it is in working order.  I have discussed the various risks for him with his upcoming surgery.  I have asked him to take his CPAP mask to the hospital, and respiratory therapy will provide him with a CPAP machine set on 14 cm.  I also discussed the generalized risk of his morbid obesity, and that he may require mechanical ventilation for a period of time after his surgery.  Dr. Danielle Dess has already discussed with him the possibility of a tracheostomy.  My final concern is that of thromboembolic disease.  He has had a DVT in his left leg in 2009, and is obviously at increased risk for his upcoming surgery.  He will need DVT prophylaxis of some type, and early mobilization.

## 2011-05-09 ENCOUNTER — Encounter: Payer: Self-pay | Admitting: Pulmonary Disease

## 2011-05-13 ENCOUNTER — Inpatient Hospital Stay (HOSPITAL_COMMUNITY)
Admission: RE | Admit: 2011-05-13 | Discharge: 2011-05-15 | DRG: 472 | Disposition: A | Discharge status: Home / Self Care | Payer: 59 | Source: Ambulatory Visit | Attending: Neurological Surgery | Admitting: Neurological Surgery

## 2011-05-13 ENCOUNTER — Inpatient Hospital Stay (HOSPITAL_COMMUNITY): Payer: 59

## 2011-05-13 DIAGNOSIS — Z01810 Encounter for preprocedural cardiovascular examination: Secondary | ICD-10-CM

## 2011-05-13 DIAGNOSIS — G4733 Obstructive sleep apnea (adult) (pediatric): Secondary | ICD-10-CM | POA: Diagnosis present

## 2011-05-13 DIAGNOSIS — G609 Hereditary and idiopathic neuropathy, unspecified: Secondary | ICD-10-CM | POA: Diagnosis present

## 2011-05-13 DIAGNOSIS — M4712 Other spondylosis with myelopathy, cervical region: Principal | ICD-10-CM | POA: Diagnosis present

## 2011-05-13 DIAGNOSIS — Z01812 Encounter for preprocedural laboratory examination: Secondary | ICD-10-CM

## 2011-05-13 DIAGNOSIS — Z6841 Body Mass Index (BMI) 40.0 and over, adult: Secondary | ICD-10-CM

## 2011-05-13 DIAGNOSIS — Z01818 Encounter for other preprocedural examination: Secondary | ICD-10-CM

## 2011-05-28 NOTE — Op Note (Signed)
NAMEMarland Kitchen  Richard Sandoval NO.:  [id number]  MEDICAL RECORD NO.:  [id number]  LOCATION:  3114                         FACILITY:  [facility]  PHYSICIAN:  Stefani Dama, M.D.  DATE OF BIRTH:  1946-02-17  DATE OF PROCEDURE:  05/13/2011 DATE OF DISCHARGE:                              OPERATIVE REPORT   PREOPERATIVE DIAGNOSIS:  Cervical spondylosis with myelopathy at C3-C4.  POSTOPERATIVE DIAGNOSIS:  Cervical spondylosis with myelopathy at C3-C4.  OPERATION:  Anterior cervical decompression at C3-C4, arthrodesis with structural allograft, Alphatec plate fixation at C3-C4.  SURGEON:  Stefani Dama, MD  FIRST ASSISTANT:  Danae Orleans. Venetia Maxon, MD  ANESTHESIA:  General endotracheal.  INDICATIONS:  Richard Sandoval 65 year old individual who has morbid obesity and difficult airway and has developed progressive weakness in his arms and his legs to the point where he walks with a footdrop, requires use of a rolling walker, and has great deal of difficulty with transfers. He also has noted weakness evolving in his upper extremities and his hands with decreased grip strength.  He was found to have severe spondylitic myelopathy.  This was noted couple of years ago and the patient was advised regarding weight control program as he was advised regarding the risks of surgery which include because of his difficult airway, possibility that he could end up with a tracheostomy.  Despite this, the patient's continue to gain weight and now he is developing increasing weakness and has been advised that he needs to undergo surgical decompression despite these other risks.  He was taken to the operating room for this procedure.  PROCEDURE:  The patient was brought to the operating room supine on the stretcher.  After smooth induction of general endotracheal anesthesia, he was placed in 5 pounds of halter traction.  Neck was prepped with alcohol and DuraPrep and draped in a sterile fashion.  A  transverse incision was created high up in the left-sided neck and carried down through the platysma.  The plane between the sternocleidomastoid and strap muscles were dissected bluntly until the prevertebral space was reached.  The first identifiable disk space was noted to be that of C3- C4 on localizing radiograph, then by carefully dissecting the longus coli muscle off either side of the midline.  Self-retaining Caspar-type retractor could be placed deep into the wound.  The disk space was then opened with a 15-blade and a combination of curettes and rongeurs was used to evacuate significant quantity of severely degenerated and desiccated disk material.  As region of the posterior longitudinal ligament was reached, it was noted to be thickened ligament here and redundancy to the ligament with disk entrapped underneath it.  There was disk herniation in the subligamentous space under a significant osteophyte under the vertebral body of C3.  It was also noted to be a good portion of the old ligament that was attached to the dura itself. Care was taken to dissect this free and the disk space was completed freeing up thickened ligament, attachment to the dura and osteophytic material from the inferior margin of the C3 and superior margin of the C4.  Once this area was decompressed centrally, the decompression was taken  out to the lateral gutters.  The bony endplates were then smoothed and debrided with a 4-mm barrel bit and 8-mm transgraft, we shaved to the appropriate size and configuration fitted into the interspace, filled with demineralized bone matrix.  Additional demineralized bone matrix was then placed into either a lateral gutter.  Once the graft was placed, traction was removed and a 14-mm standard size Alphatec plate of the Trestle variety was placed on the ventral aspect of the vertebral bodies and secured with locking 4 x 14-mm screws.  Once these were placed, the plate was  secured.  Wound was irrigated copiously with antibiotic irrigating solution.  Hemostasis in all of the soft tissue planes was checked carefully and was verified with the bipolar cautery were necessary and then the retractors were removed.  The wound was closed with 3-0 Vicryl in the platysma and 3-0 Vicryl in the subcuticular tissues and Dermabond on the skin.  The patient tolerated the procedure well. Blood loss was estimated about 50 mL.  He was returned to recovery room in stable condition.     Stefani Dama, M.D.     Merla Riches  D:  05/13/2011  T:  05/14/2011  Job:  [id number]  Electronically Signed by Barnett Abu M.D. on 05/28/2011 07:04:09 AM

## 2011-05-28 NOTE — Discharge Summary (Signed)
  [name], [name] NO.:  [id number]  MEDICAL RECORD NO.:  [id number]  LOCATION:  3114                         FACILITY:  [facility]  PHYSICIAN:  Stefani Dama, M.D.  DATE OF BIRTH:  [birth year]  DATE OF ADMISSION:  05/13/2011 DATE OF DISCHARGE:  05/15/2011                              DISCHARGE SUMMARY   ADMITTING DIAGNOSES: 1. Cervical spondylosis with myelopathy, C3-C4. 2. Morbid obesity (BMI greater than 60). 3. Hypertension. 4. Obstructive sleep apnea.  DISCHARGE AND FINAL DIAGNOSES: 1. Cervical spondylosis with myelopathy, C3-C4. 2. Morbid obesity (BMI greater than 60). 3. Hypertension. 4. Obstructive sleep apnea.  CONDITION ON DISCHARGE:  Improving.  HOSPITAL COURSE:  Mr. Richard Sandoval is a 65 year old right-handed individual who has had problems with lumbar spondylotic stenosis.  He underwent surgery for that years ago.  I have been seeing the patient intermittently and in the last year, he complained of neck pain and dysesthesias in his upper extremities and generalized feeling of weakness and fatigue.  It is noted that he had spondylotic disease at the level of C3-C4 in the neck and he was advised regarding conservative versus surgical treatment.  He notes that his symptoms have been progressing and the patient had some concerns about surgery and its potential to have a complication from surgery; however, ultimately he has decided to proceed with surgical intervention and decompress at the levels of C3-C4.  He was admitted for this procedure.  The patient does suffer with morbid obesity.  He is currently stating his weight of 430 pounds, 5 feet 10 inch, which gives him a BMI of greater than 60.  He also has obstructive sleep apnea and hypertension.  The patient was taken to the operating room, where he underwent an anterior cervical decompression and arthrodesis at the level of C3-4. He tolerated this procedure well.  He was able to be  mobilized on the first postoperative day.  He had a very uncomfortable night sleep with difficulty fitting his sleep apnea mask.  For this reason, I decided to observe him in the hospital for another hospital day.  He tolerated this better and he notes that his ability to swallow is improving.  His ability breath in the seated-position is improving also.  His incision remained clean and dry.  He has not required much narcotic pain medication. He is given a prescription only for the Valium 5 mg as needed for neck discomfort.  He will be seen in followup in 3 weeks time.  He has a sleep apnea device at home and he will be using this in follow up as an outpatient.  Condition on discharge is improving.     Stefani Dama, M.D.     Merla Riches  D:  05/15/2011  T:  05/15/2011  Job:  [id number]  Electronically Signed by Barnett Abu M.D. on 05/28/2011 07:04:04 AM

## 2011-05-28 NOTE — H&P (Signed)
NAMEMarland Sandoval  Richard Sandoval, Richard NO.:  [id number]  MEDICAL RECORD NO.:  [id number]  LOCATION:  3114                         FACILITY:  [facility]  PHYSICIAN:  Stefani Dama, M.D.  DATE OF BIRTH:  1946/01/16  DATE OF ADMISSION:  05/13/2011 DATE OF DISCHARGE:                             HISTORY & PHYSICAL   ADMITTING DIAGNOSES: 1. Cervical spondylosis with myelopathy. 2. Morbid obesity. 3. Difficult airway. 4. Sleep apnea.  HISTORY OF PRESENT ILLNESS:  Mr. Richard Sandoval is a 65 year old right- handed individual who was seen for difficulties with spinal cord compression on a number of years ago.  In fact in 2006 he was scheduled for an anterior cervical decompression and fusion.  I discussed the major risks which included in his case significant possibility of airway difficulties secondary to his morbid obesity and his advanced sleep apnea.  He then canceled the surgery against medical advice.  He was advised regarding weight loss, however, and it is not clear that this is actually occurred.  At the time of admission his weight is 430 pounds and he is 5 foot 10 inch in height, this gives him a BMI of greater than 60.  He does have significant peripheral neuropathies in his upper extremities with tingling and numbness in his hands which may be related to peripheral neuropathy and/or carpal tunnel syndrome.  He has had symptoms of posterior neck pain.  He feels lot of sensations in his upper extremities when he lies down, and he feels tingling and numbness all over.  We assume in certain positions that he has had occasional headaches but he has had no convulsive seizures or loss of consciousness.  He has not had any physical therapy, tries to remain as active as he can but even walking short distances tends to tire him significantly.  PAST MEDICAL HISTORY:  Notable for hypertension, hypercholesterolemia, depression, then the cervical myelopathy in addition to  some difficulties with sleep apnea.  FAMILY HISTORY:  His mother and father both deceased.  Medical issues are unknown in his family.  PAST SURGICAL HISTORY:  In September 2000 he had L3-L5 posterior spinal fusion with decompression.  DRUG ALLERGY:  To PENICILLIN.  SOCIAL HISTORY:  Negative for tobacco use.  Negative for alcohol use. Negative for substance abuse.  Recently he had 8-pound weight loss and currently is 430 pounds and 5 foot 10 as noted.  REVIEW OF SYSTEMS:  On a 14-point review, he is positive for hypertension, swelling in his hands, shortness of breath, sleep apnea, neck pain, joint pain, back pain, arm pain.  He wears glasses.  He has night sweats.  He has had some recent weight loss and chronic depression.  CURRENT MEDICATIONS: 1. Wellbutrin XL 300 mg a day. 2. Naproxen 500 mg b.i.d. 3. Pravachol 40 mg a day. 4. Cozaar 100 mg per day. 5. Aspirin 81 mg per day. 6. Stool softener. 7. Hydrocodone 10/650 for pain control.  PHYSICAL EXAMINATION:  He is a morbidly obese individual who is alert and oriented.  His cranial nerves are completely intact and normal.  He has good range of motion of the cervical spine which allow him to turn 45 degrees  left and right.  Flexes and extends approximately 15 degrees in each direction.  His motor strength is 5/5 in all the major muscle groups including grips and intrinsics strength.  There is some suggestion of some deltoid weakness.  This is  difficult to verify secondary to patient's bulk.  He has some decreased motor function in the lower extremities with great difficulty getting from a seated to a standing position maintaining that position.  He has good strength in dorsi and plantar flexors.  His general physical exam reveals that is lungs are clear to auscultation.  His heart has regular rate and rhythm. No murmurs are noted.  The abdomen is soft, protuberant bowel sounds are positive.  No masses are  noted.  IMPRESSION:  The patient has evidence of severe advanced spondylitic disease with cervical myelopathy at C3-C4 level.  He is to undergo surgical decompression via an anterior cervical diskectomy and arthrodesis which has been scheduled and we will see how he deals with surgical intervention.     Stefani Dama, M.D.     Merla Riches  D:  05/13/2011  T:  05/14/2011  Job:  [id number]  Electronically Signed by Barnett Abu M.D. on 05/28/2011 07:04:15 AM

## 2012-02-04 DIAGNOSIS — M542 Cervicalgia: Secondary | ICD-10-CM | POA: Diagnosis not present

## 2012-05-05 DIAGNOSIS — R109 Unspecified abdominal pain: Secondary | ICD-10-CM | POA: Diagnosis not present

## 2012-05-05 DIAGNOSIS — Z23 Encounter for immunization: Secondary | ICD-10-CM | POA: Diagnosis not present

## 2012-05-05 DIAGNOSIS — L03319 Cellulitis of trunk, unspecified: Secondary | ICD-10-CM | POA: Diagnosis not present

## 2012-05-10 ENCOUNTER — Inpatient Hospital Stay (HOSPITAL_COMMUNITY)
Admission: EM | Admit: 2012-05-10 | Discharge: 2012-05-12 | DRG: 603 | Disposition: A | Discharge status: Home Health | Payer: 59 | Attending: Internal Medicine | Admitting: Internal Medicine

## 2012-05-10 ENCOUNTER — Encounter (HOSPITAL_COMMUNITY): Payer: Self-pay | Admitting: Emergency Medicine

## 2012-05-10 DIAGNOSIS — I10 Essential (primary) hypertension: Secondary | ICD-10-CM | POA: Diagnosis present

## 2012-05-10 DIAGNOSIS — Z79899 Other long term (current) drug therapy: Secondary | ICD-10-CM | POA: Diagnosis not present

## 2012-05-10 DIAGNOSIS — Z6841 Body Mass Index (BMI) 40.0 and over, adult: Secondary | ICD-10-CM | POA: Diagnosis not present

## 2012-05-10 DIAGNOSIS — E785 Hyperlipidemia, unspecified: Secondary | ICD-10-CM | POA: Diagnosis present

## 2012-05-10 DIAGNOSIS — L03319 Cellulitis of trunk, unspecified: Secondary | ICD-10-CM | POA: Diagnosis not present

## 2012-05-10 DIAGNOSIS — Z86718 Personal history of other venous thrombosis and embolism: Secondary | ICD-10-CM

## 2012-05-10 DIAGNOSIS — E669 Obesity, unspecified: Secondary | ICD-10-CM

## 2012-05-10 DIAGNOSIS — L02219 Cutaneous abscess of trunk, unspecified: Principal | ICD-10-CM | POA: Diagnosis present

## 2012-05-10 DIAGNOSIS — L0390 Cellulitis, unspecified: Secondary | ICD-10-CM | POA: Diagnosis present

## 2012-05-10 DIAGNOSIS — G4733 Obstructive sleep apnea (adult) (pediatric): Secondary | ICD-10-CM | POA: Diagnosis not present

## 2012-05-10 DIAGNOSIS — L03311 Cellulitis of abdominal wall: Secondary | ICD-10-CM

## 2012-05-10 DIAGNOSIS — Z7982 Long term (current) use of aspirin: Secondary | ICD-10-CM | POA: Diagnosis not present

## 2012-05-10 DIAGNOSIS — L0291 Cutaneous abscess, unspecified: Secondary | ICD-10-CM | POA: Diagnosis present

## 2012-05-10 LAB — CBC WITH DIFFERENTIAL/PLATELET
Basophils Relative: 1 % (ref 0–1)
Eosinophils Relative: 1 % (ref 0–5)
HCT: 45.1 % (ref 39.0–52.0)
Hemoglobin: 14.9 g/dL (ref 13.0–17.0)
Lymphocytes Relative: 32 % (ref 12–46)
Monocytes Relative: 10 % (ref 3–12)
Neutro Abs: 4.6 10*3/uL (ref 1.7–7.7)
RBC: 5.16 MIL/uL (ref 4.22–5.81)
WBC: 8.2 10*3/uL (ref 4.0–10.5)

## 2012-05-10 LAB — CBC
HCT: 42.3 % (ref 39.0–52.0)
MCH: 28.5 pg (ref 26.0–34.0)
MCHC: 32.9 g/dL (ref 30.0–36.0)
MCV: 86.7 fL (ref 78.0–100.0)
RDW: 13.8 % (ref 11.5–15.5)

## 2012-05-10 LAB — BASIC METABOLIC PANEL
BUN: 10 mg/dL (ref 6–23)
Chloride: 100 mEq/L (ref 96–112)
Glucose, Bld: 105 mg/dL — ABNORMAL HIGH (ref 70–99)
Potassium: 4.5 mEq/L (ref 3.5–5.1)

## 2012-05-10 MED ORDER — ENOXAPARIN SODIUM 100 MG/ML ~~LOC~~ SOLN
95.0000 mg | SUBCUTANEOUS | Status: DC
  Administered 2012-05-10 – 2012-05-11 (×2): 95 mg via SUBCUTANEOUS
  Filled 2012-05-10 (×3): qty 1

## 2012-05-10 MED ORDER — DEXTROSE 5 % IV SOLN
900.0000 mg | Freq: Three times a day (TID) | INTRAVENOUS | Status: DC
  Administered 2012-05-11: 900 mg via INTRAVENOUS
  Filled 2012-05-10 (×3): qty 6

## 2012-05-10 MED ORDER — ENOXAPARIN SODIUM 40 MG/0.4ML ~~LOC~~ SOLN
40.0000 mg | SUBCUTANEOUS | Status: DC
  Filled 2012-05-10: qty 0.4

## 2012-05-10 MED ORDER — VANCOMYCIN HCL IN DEXTROSE 1-5 GM/200ML-% IV SOLN
1000.0000 mg | Freq: Once | INTRAVENOUS | Status: AC
  Administered 2012-05-10: 1000 mg via INTRAVENOUS
  Filled 2012-05-10: qty 200

## 2012-05-10 MED ORDER — HYDROMORPHONE HCL PF 1 MG/ML IJ SOLN
1.0000 mg | INTRAMUSCULAR | Status: DC | PRN
  Administered 2012-05-11: 1 mg via INTRAVENOUS
  Filled 2012-05-10 (×2): qty 1

## 2012-05-10 MED ORDER — SIMVASTATIN 40 MG PO TABS
40.0000 mg | ORAL_TABLET | Freq: Every day | ORAL | Status: DC
  Administered 2012-05-11: 40 mg via ORAL
  Filled 2012-05-10 (×2): qty 1

## 2012-05-10 MED ORDER — VANCOMYCIN HCL 1000 MG IV SOLR
1500.0000 mg | INTRAVENOUS | Status: AC
  Administered 2012-05-10: 1500 mg via INTRAVENOUS
  Filled 2012-05-10: qty 1500

## 2012-05-10 MED ORDER — VANCOMYCIN HCL 1000 MG IV SOLR
1500.0000 mg | Freq: Two times a day (BID) | INTRAVENOUS | Status: AC
  Administered 2012-05-11: 1500 mg via INTRAVENOUS
  Filled 2012-05-10: qty 1500

## 2012-05-10 MED ORDER — SODIUM CHLORIDE 0.9 % IV SOLN
INTRAVENOUS | Status: DC
  Administered 2012-05-10: 22:00:00 via INTRAVENOUS

## 2012-05-10 MED ORDER — ASPIRIN 81 MG PO TABS: 81.0000 mg | ORAL_TABLET | Freq: Every day | ORAL | Status: DC

## 2012-05-10 MED ORDER — PIPERACILLIN-TAZOBACTAM 3.375 G IVPB 30 MIN
3.3750 g | Freq: Three times a day (TID) | INTRAVENOUS | Status: DC
  Filled 2012-05-10 (×2): qty 50

## 2012-05-10 MED ORDER — ONDANSETRON HCL 4 MG/2ML IJ SOLN: 4.0000 mg | Freq: Four times a day (QID) | INTRAMUSCULAR | Status: DC | PRN

## 2012-05-10 MED ORDER — CLINDAMYCIN PHOSPHATE 600 MG/50ML IV SOLN
600.0000 mg | Freq: Three times a day (TID) | INTRAVENOUS | Status: DC
  Filled 2012-05-10 (×2): qty 50

## 2012-05-10 MED ORDER — DOCUSATE SODIUM 100 MG PO CAPS
100.0000 mg | ORAL_CAPSULE | Freq: Two times a day (BID) | ORAL | Status: DC
  Administered 2012-05-10 – 2012-05-12 (×3): 100 mg via ORAL
  Filled 2012-05-10 (×5): qty 1

## 2012-05-10 MED ORDER — ASPIRIN 81 MG PO CHEW
81.0000 mg | CHEWABLE_TABLET | Freq: Every day | ORAL | Status: DC
  Administered 2012-05-11 – 2012-05-12 (×2): 81 mg via ORAL
  Filled 2012-05-10 (×2): qty 1

## 2012-05-10 MED ORDER — CLINDAMYCIN PHOSPHATE 600 MG/50ML IV SOLN: 600.0000 mg | Freq: Once | INTRAVENOUS | Status: DC

## 2012-05-10 MED ORDER — ONDANSETRON HCL 4 MG PO TABS
4.0000 mg | ORAL_TABLET | Freq: Four times a day (QID) | ORAL | Status: DC | PRN
  Administered 2012-05-11: 4 mg via ORAL
  Filled 2012-05-10: qty 1

## 2012-05-10 NOTE — ED Provider Notes (Signed)
Richard Sandoval is a 66 y.o. male with abdominal discomfort, for 10 days. He had a lesion in burst and drain, on his left pannus. Since then, he has been on antibiotics, and continues to be uncomfortable. He had a similar episode with the left leg years ago. He does not have diabetes.  The panniculus is remarkable for a large, open wounds, left side beneath the pannus. There is associated induration and erythema, consistent with cellulitis across the whole pannus.  Patient will need to be admitted for parenteral antibiotics, and wound care.   Medical screening examination/treatment/procedure(s) were conducted as a shared visit with non-physician practitioner(s) and myself.  I personally evaluated the patient during the encounter  Flint Melter, MD 05/11/12 [phone]

## 2012-05-10 NOTE — ED Provider Notes (Signed)
History     CSN: [id number]  Arrival date & time 05/10/12  1433   First MD Initiated Contact with Patient 05/10/12 1835      Chief Complaint  Patient presents with  . Abscess    (Consider location/radiation/quality/duration/timing/severity/associated sxs/prior treatment) HPI Comments: Morbidly obese man presents with abscess to left lower abdomen that began 9-10 days ago.  He has been treated with bactrim for the past 4 days.  Three days ago the abscess popped spontaneously, draining blood and pus.  States since the area has gotten worse and is more painful.  States he initially had subjective fevers with chills and headache, though he has not had a fever this weekend.  Is seen by [facility], The primary care, was sent in today for "labs and IV antibiotics."  Does have nausea.  Pain is currently 9/10.  Denies deeper abdominal pain, vomiting, diarrhea, CP, SOB.  Pt is not diabetic.   Patient is a 66 y.o. male presenting with abscess. The history is provided by the patient.  Abscess  Associated symptoms include cough. Pertinent negatives include no diarrhea and no vomiting.    Past Medical History  Diagnosis Date  . OSA (obstructive sleep apnea)   . Hypertension   . DVT (deep venous thrombosis)     L leg in 2009    Past Surgical History  Procedure Date  . Back surgery 04/09/1999  . Knee surgery 1998    Family History  Problem Relation Age of Onset  . Breast cancer Mother     History  Substance Use Topics  . Smoking status: Never Smoker   . Smokeless tobacco: Not on file  . Alcohol Use: Not on file      Review of Systems  Constitutional: Positive for chills.  Respiratory: Positive for cough. Negative for shortness of breath.   Cardiovascular: Negative for chest pain.  Gastrointestinal: Positive for nausea. Negative for vomiting and diarrhea.  Skin: Positive for color change and wound.  All other systems reviewed and are negative.    Allergies  Penicillins  Home  Medications   Current Outpatient Rx  Name Route Sig Dispense Refill  . ASPIRIN 81 MG PO TABS Oral Take 81 mg by mouth daily.      [name] CLOBETASOL PROPIONATE 0.05 % EX CREA Topical Apply 1 application topically 2 (two) times daily. Left side of abs    . DOCUSATE SODIUM 100 MG PO CAPS Oral Take 100 mg by mouth 2 (two) times daily.    [name] POLYETHYLENE GLYCOL 3350 PO PACK Oral Take 17 g by mouth daily as needed. For constipation    . MIRALAX PO Oral Take by mouth as needed.      [name] PRAVASTATIN SODIUM 40 MG PO TABS Oral Take 40 mg by mouth daily.      . SULFAMETHOXAZOLE-TMP DS 800-160 MG PO TABS Oral Take 1 tablet by mouth 2 (two) times daily. For 14 days; Start date 05/06/12    . TRAMADOL HCL 50 MG PO TABS Oral Take 50-100 mg by mouth every 4 (four) hours as needed. For pain      BP 146/91  Pulse 81  Temp 98.4 F (36.9 C) (Oral)  Resp 18  SpO2 98%  Physical Exam  Nursing note and vitals reviewed. Constitutional: He appears well-developed and well-nourished. No distress.  HENT:  Head: Normocephalic and atraumatic.  Neck: Neck supple.  Cardiovascular: Normal rate and regular rhythm.   Pulmonary/Chest: Effort normal and breath sounds normal. No respiratory distress. He  has no wheezes. He has no rales.  Abdominal: Soft. He exhibits no distension and no mass. There is no rebound and no guarding.       Tenderness only superficially over cellulitic area  Neurological: He is alert. He exhibits normal muscle tone.  Skin: He is not diaphoretic.       ED Course  Procedures (including critical care time)  Labs Reviewed  BASIC METABOLIC PANEL - Abnormal; Notable for the following:    Glucose, Bld 105 (*)     GFR calc non Af Amer 70 (*)     GFR calc Af Amer 81 (*)     All other components within normal limits  CBC WITH DIFFERENTIAL   No results found.  7:08 PM Pt seen and examined.  Pt declines pain medication at this time. Discussed patient with Dr Effie Shy who will also see the patient.     7:36 PM Pt admitted to [facility] hospitalist, Dr Conley Rolls.    1. Abdominal wall cellulitis     MDM  Morbidly obese man with open abscess under pannus, cellulitis of lower abdomen.  Pt is afebrile, nontoxic.  Has been on bactrim x 4 days with worsening symptoms.  Admitted for failure of outpatient treatment to [facility] hospitalist.  Cellulitis marked by me.  Pt verbalizes understanding and agrees with plan for IV abx and admission.  Antibiotics discussed with hospitalist, agreed on vanc and clinda.          [place], [place] 05/10/12 1938  [place], [place] 05/10/12 1945

## 2012-05-10 NOTE — Progress Notes (Signed)
Pt on CPAP auto setting on room air.

## 2012-05-10 NOTE — ED Notes (Signed)
Pt c/o abscess to left abd area with hx of similar with MRSA; pt sts purulent discharge on Friday and has been taking antibiotics but not improving

## 2012-05-10 NOTE — Consult Note (Signed)
ANTIBIOTIC CONSULT NOTE - INITIAL  Pharmacy Consult for Vancomycin Indication: Left Lower Abdominal Abscess [s/p Bactrim x 4 days PTA as outpt].  Hospital Problems: Principal Problem:  *Cellulitis and abscess Active Problems:  OBESITY, NOS  OSA (obstructive sleep apnea)  History of MRSA infection.  Allergies: Allergen  . Penicillins    Patient Measurements: Height: 5\' 10"  (177.8 cm) Weight: 429 lb 3.8 oz (194.7 kg) IBW/kg (Calculated) : 73   Vital Signs: BP 134/76  Pulse 77  Temp 98.7 F (37.1 C) (Oral)  Resp 18  Ht 5\' 10"  (1.778 m)  Wt 429 lb 3.8 oz (194.7 kg)  BMI 61.59 kg/m2  SpO2 97%  Labs:  [facility] 05/10/12 2128 05/10/12 1456  WBC 9.5 8.2  HGB 13.9 14.9  PLT 249 260  LABCREA -- --  CREATININE -- 1.08   Estimated Creatinine Clearance: 117.4 ml/min (by C-G formula based on Cr of 1.08).   Microbiology: Patient with reported history of MRSA infection.  Medical/Surgical History: . OSA (obstructive sleep apnea)  . Hypertension  . DVT (deep venous thrombosis)   History of MRSA infection   Past Surgical History  Procedure Date  . Back surgery 04/09/1999  . Knee surgery 1998    Medications:  Prescriptions prior to admission  Medication Sig Dispense Refill  . aspirin 81 MG tablet Take 81 mg by mouth daily.        . clobetasol cream (TEMOVATE) 0.05 % Apply 1 application topically 2 (two) times daily. Left side of abs      . docusate sodium (COLACE) 100 MG capsule Take 100 mg by mouth 2 (two) times daily.      . polyethylene glycol (MIRALAX / GLYCOLAX) packet Take 17 g by mouth daily as needed. For constipation      . Polyethylene Glycol 3350 (MIRALAX PO) Take by mouth as needed.        . pravastatin (PRAVACHOL) 40 MG tablet Take 40 mg by mouth daily.        [name] sulfamethoxazole-trimethoprim (BACTRIM DS) 800-160 MG per tablet Take 1 tablet by mouth 2 (two) times daily. For 14 days; Start date 05/06/12      . traMADol (ULTRAM) 50 MG tablet Take 50-100 mg by  mouth every 4 (four) hours as needed. For pain       Scheduled:     . aspirin  81 mg Oral Daily  . docusate sodium  100 mg Oral BID  . enoxaparin (LOVENOX) injection  40 mg Subcutaneous Q24H  . simvastatin  40 mg Oral q1800  . vancomycin (VANCOCIN) IVPB  1,500 mg Intravenous NOW  . vancomycin  1,000 mg Intravenous Once  . DISCONTD: aspirin  81 mg Oral Daily  . DISCONTD: clindamycin (CLEOCIN) IV  600 mg Intravenous Once  . DISCONTD: clindamycin (CLEOCIN) IV  600 mg Intravenous Q8H  . DISCONTD: piperacillin-tazobactam  3.375 g Intravenous Q8H   Anti-infectives     Start     Dose/Rate Route Frequency Ordered Stop   05/10/12 2215   vancomycin (VANCOCIN) 1,500 mg in sodium chloride 0.9 % 500 mL IVPB        1,500 mg 250 mL/hr over 120 Minutes Intravenous NOW 05/10/12 2212 05/11/12 2215   05/10/12 2200   clindamycin (CLEOCIN) IVPB 600 mg  Status:  Discontinued        600 mg 100 mL/hr over 30 Minutes Intravenous 3 times per day 05/10/12 1934 05/10/12 2212   05/10/12 2200   piperacillin-tazobactam (ZOSYN) IVPB 3.375 g  Status:  Discontinued        3.375 g 100 mL/hr over 30 Minutes Intravenous 3 times per day 05/10/12 2124 05/10/12 2218   05/10/12 1945   vancomycin (VANCOCIN) IVPB 1000 mg/200 mL premix        1,000 mg 200 mL/hr over 60 Minutes Intravenous  Once 05/10/12 1933 05/10/12 2115   05/10/12 1945   clindamycin (CLEOCIN) IVPB 600 mg  Status:  Discontinued        600 mg 100 mL/hr over 30 Minutes Intravenous  Once 05/10/12 1933 05/10/12 1934          Assessment:  66 y/o Morbidly obese man [194.7 kg, BMI 62] with a history of a previous MRSA infection presents with abscess to left lower abdomen that began 9-10 days ago. He has been treated with bactrim for the past 4 days with worsening clinical course.  Vancomycin and Clindamycin have been ordered.  Zosyn has been discontinued due to PCN Allergy.  CrCL > 110,  WBC 9.5.  Lovenox has been ordered for VTE prophylaxis.  Per  discussion with Dr. Conley Rolls, given past history of DVT, will adjust Lovenox to 0.5 mg/kg/q 24 hours.  Goal of Therapy:   Vancomycin trough level 15-20 mcg/ml  Plan:   Give and additional dose of Vancomycin 1500 mg IV to complete 2500 mg Vancomycin Load, then begin Vancomycin 1500 mg IV q 12 hours.  D/C Zosyn due to PCN allergy.    Will resume Clindamycin 900 mg IV q 8 hours.  Change Lovenox to 95 mg sq q 24 hours.  Measure antibiotic drug levels at steady state  Follow up culture results  Laurena Bering, Pharm.D. 10/14/201310:18 PM

## 2012-05-11 DIAGNOSIS — L0390 Cellulitis, unspecified: Secondary | ICD-10-CM

## 2012-05-11 LAB — MRSA PCR SCREENING: MRSA by PCR: NEGATIVE

## 2012-05-11 MED ORDER — ONDANSETRON HCL 4 MG/2ML IJ SOLN: 4.0000 mg | Freq: Three times a day (TID) | INTRAMUSCULAR | Status: AC | PRN

## 2012-05-11 MED ORDER — HYDROMORPHONE HCL PF 1 MG/ML IJ SOLN: 1.0000 mg | INTRAMUSCULAR | Status: AC | PRN

## 2012-05-11 MED ORDER — SODIUM CHLORIDE 0.9 % IV SOLN
INTRAVENOUS | Status: AC
  Administered 2012-05-11: 20:00:00 via INTRAVENOUS

## 2012-05-11 NOTE — Progress Notes (Signed)
PATIENT DETAILS Name: Richard Sandoval Age: 66 y.o. Sex: male Date of Birth: 06/30/1946 Admit Date: 05/10/2012 Admitting Physician Houston Siren, MD NFA:[id number],MVHQION S, MD  Subjective: No major events overnight. Significant decrease in abd wall erythema (area marked)  Assessment/Plan: Principal Problem:  *Cellulitis and abscess -abd all wound-open-not draining. No obvious area of fluctuation -hardly any erythema in the marked area -stop Clindamycin, continue with Vancomycin -get wound care -if clinical improvement continues, potential d/c 10/16  Active Problems: Dyslipidemia -continue Statins   OBESITY, NOS -Morbid Obesity-have counseled regarding weight loss   OSA (obstructive sleep apnea) -c/w CPAP  Disposition: Remain inpatient  DVT Prophylaxis: Prophylactic Lovenox   Code Status: Full code   Procedures: None  CONSULTS:  None  PHYSICAL EXAM: Vital signs in last 24 hours: Filed Vitals:   05/10/12 2132 05/10/12 2356 05/11/12 0453 05/11/12 0546  BP: 134/76  157/82   Pulse: 77 80 52 70  Temp: 98.7 F (37.1 C)  98.1 F (36.7 C)   TempSrc: Oral  Oral   Resp: 18 22 18    Height:      Weight:      SpO2: 97% 97% 98%     Weight change:  Body mass index is 61.59 kg/(m^2).   Gen Exam: Awake and alert with clear speech.   Neck: Supple, No JVD.   Chest: B/L Clear.   CVS: S1 S2 Regular, no murmurs.  Abdomen: soft, BS +, non tender, non distended. Small dime shaped open ulcer in the right mid abd area, no erythema noted in the demarcated area-only chronic skin changes seen Extremities: no edema, lower extremities warm to touch. Neurologic: Non Focal.   Skin: No Rash.  Wounds: N/A.    Intake/Output from previous day:  Intake/Output Summary (Last 24 hours) at 05/11/12 1021 Last data filed at 05/11/12 0900  Gross per 24 hour  Intake    665 ml  Output   1000 ml  Net   -335 ml     LAB RESULTS: CBC  Lab 05/10/12 2128 05/10/12 1456  WBC 9.5 8.2  HGB 13.9  14.9  HCT 42.3 45.1  PLT 249 260  MCV 86.7 87.4  MCH 28.5 28.9  MCHC 32.9 33.0  RDW 13.8 13.8  LYMPHSABS -- 2.6  MONOABS -- 0.8  EOSABS -- 0.1  BASOSABS -- 0.1  BANDABS -- --    Chemistries   Lab 05/10/12 2128 05/10/12 1456  NA -- 136  K -- 4.5  CL -- 100  CO2 -- 27  GLUCOSE -- 105*  BUN -- 10  CREATININE 1.07 1.08  CALCIUM -- 9.8  MG -- --    CBG: No results found for this basename: GLUCAP:5 in the last 168 hours  GFR Estimated Creatinine Clearance: 118.5 ml/min (by C-G formula based on Cr of 1.07).  Coagulation profile No results found for this basename: INR:5,PROTIME:5 in the last 168 hours  Cardiac Enzymes No results found for this basename: CK:3,CKMB:3,TROPONINI:3,MYOGLOBIN:3 in the last 168 hours  No components found with this basename: POCBNP:3 No results found for this basename: DDIMER:2 in the last 72 hours No results found for this basename: HGBA1C:2 in the last 72 hours No results found for this basename: CHOL:2,HDL:2,LDLCALC:2,TRIG:2,CHOLHDL:2,LDLDIRECT:2 in the last 72 hours No results found for this basename: TSH,T4TOTAL,FREET3,T3FREE,THYROIDAB in the last 72 hours No results found for this basename: VITAMINB12:2,FOLATE:2,FERRITIN:2,TIBC:2,IRON:2,RETICCTPCT:2 in the last 72 hours No results found for this basename: LIPASE:2,AMYLASE:2 in the last 72 hours  Urine Studies No results found for this basename:  UACOL:2,UAPR:2,USPG:2,UPH:2,UTP:2,UGL:2,UKET:2,UBIL:2,UHGB:2,UNIT:2,UROB:2,ULEU:2,UEPI:2,UWBC:2,URBC:2,UBAC:2,CAST:2,CRYS:2,UCOM:2,BILUA:2 in the last 72 hours  MICROBIOLOGY: Recent Results (from the past 240 hour(s))  MRSA PCR SCREENING     Status: Normal   Collection Time   05/10/12 11:48 PM      Component Value Range Status Comment   MRSA by PCR NEGATIVE  NEGATIVE Final     RADIOLOGY STUDIES/RESULTS: No results found.  MEDICATIONS: Scheduled Meds:   . aspirin  81 mg Oral Daily  . docusate sodium  100 mg Oral BID  . enoxaparin  95 mg  Subcutaneous Q24H  . simvastatin  40 mg Oral q1800  . vancomycin (VANCOCIN) IVPB  1,500 mg Intravenous NOW  . vancomycin (VANCOCIN) IVPB  1,500 mg Intravenous Q12H  . vancomycin  1,000 mg Intravenous Once  . DISCONTD: aspirin  81 mg Oral Daily  . DISCONTD: clindamycin (CLEOCIN) 900 mg IVPB (ADD-Vant)  900 mg Intravenous Q8H  . DISCONTD: clindamycin (CLEOCIN) IV  600 mg Intravenous Once  . DISCONTD: clindamycin (CLEOCIN) IV  600 mg Intravenous Q8H  . DISCONTD: enoxaparin (LOVENOX) injection  40 mg Subcutaneous Q24H  . DISCONTD: piperacillin-tazobactam  3.375 g Intravenous Q8H   Continuous Infusions:   . DISCONTD: sodium chloride 50 mL/hr at 05/10/12 2130   PRN Meds:.HYDROmorphone (DILAUDID) injection, ondansetron (ZOFRAN) IV, ondansetron  Antibiotics: Anti-infectives     Start     Dose/Rate Route Frequency Ordered Stop   05/11/12 1130   vancomycin (VANCOCIN) 1,500 mg in sodium chloride 0.9 % 500 mL IVPB        1,500 mg 250 mL/hr over 120 Minutes Intravenous Every 12 hours 05/10/12 2222 05/11/12 2329   05/11/12 0400   clindamycin (CLEOCIN) 900 mg in dextrose 5 % 100 mL IVPB  Status:  Discontinued        900 mg 200 mL/hr over 30 Minutes Intravenous 3 times per day 05/10/12 2221 05/11/12 1021   05/10/12 2215   vancomycin (VANCOCIN) 1,500 mg in sodium chloride 0.9 % 500 mL IVPB        1,500 mg 250 mL/hr over 120 Minutes Intravenous NOW 05/10/12 2212 05/11/12 0103   05/10/12 2200   clindamycin (CLEOCIN) IVPB 600 mg  Status:  Discontinued        600 mg 100 mL/hr over 30 Minutes Intravenous 3 times per day 05/10/12 1934 05/10/12 2212   05/10/12 2200   piperacillin-tazobactam (ZOSYN) IVPB 3.375 g  Status:  Discontinued        3.375 g 100 mL/hr over 30 Minutes Intravenous 3 times per day 05/10/12 2124 05/10/12 2218   05/10/12 1945   vancomycin (VANCOCIN) IVPB 1000 mg/200 mL premix        1,000 mg 200 mL/hr over 60 Minutes Intravenous  Once 05/10/12 1933 05/10/12 2115   05/10/12  1945   clindamycin (CLEOCIN) IVPB 600 mg  Status:  Discontinued        600 mg 100 mL/hr over 30 Minutes Intravenous  Once 05/10/12 1933 05/10/12 1934           Jeoffrey Massed, MD  [facility] Pager:[phone]  If 7PM-7AM, please contact night-coverage [email or web address] Password [email or web address] 05/11/2012, 10:21 AM   LOS: 1 day

## 2012-05-11 NOTE — ED Provider Notes (Signed)
Medical screening examination/treatment/procedure(s) were conducted as a shared visit with non-physician practitioner(s) and myself.  I personally evaluated the patient during the encounter  Flint Melter, MD 05/11/12 [phone]

## 2012-05-11 NOTE — Consult Note (Signed)
WOC consult Note Reason for Consult: Pt had lesion on abd which spontaneously ruptured and drained large amt pus.  He is well-informed regarding MRSA since he has hadother wounds in the past which have healed. Wound type: Full thickness, located in left abd pannus fold. Measurement: 2X2X1cm Wound bed: 90% red, 10% yellow Drainage (amount, consistency, odor) Large amt yellow drainage, no odor. Periwound: Previously marked erythremia is receeding.  Pt states site is not as sore. Dressing procedure/placement/frequency: Aquacel to absorb drainage and provide antimicrobial benefits. Will not plan to follow further unless re-consulted.  [address] RN, MSN, [employer]  [phone]

## 2012-05-11 NOTE — Progress Notes (Signed)
Pt seen and was offered assistance with cpap.  Settings autotitrate 8cm-20cm h2o per pt comfort.  Pt stated he has used one for many years and feels comfortable placing it on himself later when ready.  Pt advised that RT available all night and encouraged him to call/let his nurse know should he need assistance.  Humidity chamber noted near max full level.

## 2012-05-11 NOTE — Care Management Note (Signed)
    Page 1 of 1   05/11/2012     3:27:19 PM   CARE MANAGEMENT NOTE 05/11/2012  Patient:  Richard Sandoval, Richard Sandoval   Account Number:  [id number]  Date Initiated:  05/11/2012  Documentation initiated by:  Letha Cape  Subjective/Objective Assessment:   dx cellulitis and abcess  admit- lives with spouse, pta independent.     Action/Plan:   Anticipated DC Date:  05/12/2012   Anticipated DC Plan:  HOME W HOME HEALTH SERVICES      DC Planning Services  CM consult      [facility] Choice  HOME HEALTH   Choice offered to / List presented to:  C-1 Patient        HH arranged  HH-1 RN      [facility] agency  [facility]   Status of service:  In process, will continue to follow Medicare Important Message given?   (If response is "NO", the following Medicare IM given date fields will be blank) Date Medicare IM given:   Date Additional Medicare IM given:    Discharge Disposition:    Per UR Regulation:  Reviewed for med. necessity/level of care/duration of stay  If discussed at Long Length of Stay Meetings, dates discussed:    Comments:  05/11/12 15:25 Letha Cape RN, BSN [phone] patient lives with spoue, pta independent.  Patient chose [facility] from agency list for [facility] for wound care, referral made to [facility] for [facility], Kristen notified.  Patient for dc tomorrow, soc will begin 24-48 hrs post discharge.

## 2012-05-11 NOTE — H&P (Signed)
[facility] History and Physical  Richard Sandoval AVW:[id number] DOB: [birth year]    PCP:   Cala Bradford, MD   Chief Complaint: abdominal abcess.  HPI: Richard Sandoval is an 66 y.o. male with hx of morbid obesity, sleep apnea on CPAP, prior abdominal wall abcesses, HTN, back surgery with chronic low back pain, presents to the ER, as he had a boil on his lower left panus that ruptured and exuded purulent discharge.  He denied any fever or chills, nausea or vomiting, chest pain or shortness of breath.  He was started on Bactrim and Ultram by his PCP.  Evaluation in the ER included a normal WBC, Hb, and renal fx tests.  Hospitalist was asked to admit him for failed outpatient tx of cellulitis.  He was started on clindamycin in the ER.  He is allergic to PCN causing a rash.  Rewiew of Systems:  Constitutional: Negative for malaise, fever and chills. No significant weight loss or weight gain Eyes: Negative for eye pain, redness and discharge, diplopia, visual changes, or flashes of light. ENMT: Negative for ear pain, hoarseness, nasal congestion, sinus pressure and sore throat. No headaches; tinnitus, drooling, or problem swallowing. Cardiovascular: Negative for chest pain, palpitations, diaphoresis, dyspnea and peripheral edema. ; No orthopnea, PND Respiratory: Negative for cough, hemoptysis, wheezing and stridor. No pleuritic chestpain. Gastrointestinal: Negative for nausea, vomiting, diarrhea, constipation, abdominal pain, melena, blood in stool, hematemesis, jaundice and rectal bleeding.    Genitourinary: Negative for frequency, dysuria, incontinence,flank pain and hematuria; Musculoskeletal: Negative for back pain and neck pain. Negative for swelling and trauma.;  Skin: . Negative for pruritus, abrasions, bruising and skin lesion.; ulcerations Neuro: Negative for headache, lightheadedness and neck stiffness. Negative for weakness, altered level of consciousness , altered mental status,  extremity weakness, burning feet, involuntary movement, seizure and syncope.  Psych: negative for anxiety, depression, insomnia, tearfulness, panic attacks, hallucinations, paranoia, suicidal or homicidal ideation    Past Medical History  Diagnosis Date  . OSA (obstructive sleep apnea)   . Hypertension   . DVT (deep venous thrombosis)     L leg in 2009    Past Surgical History  Procedure Date  . Back surgery 04/09/1999  . Knee surgery 1998    Medications:  HOME MEDS: Prior to Admission medications   Medication Sig Start Date End Date Taking? Authorizing Provider  aspirin 81 MG tablet Take 81 mg by mouth daily.     Yes Historical Provider, MD  clobetasol cream (TEMOVATE) 0.05 % Apply 1 application topically 2 (two) times daily. Left side of abs   Yes Historical Provider, MD  docusate sodium (COLACE) 100 MG capsule Take 100 mg by mouth 2 (two) times daily.   Yes Historical Provider, MD  polyethylene glycol (MIRALAX / GLYCOLAX) packet Take 17 g by mouth daily as needed. For constipation   Yes Historical Provider, MD  Polyethylene Glycol 3350 (MIRALAX PO) Take by mouth as needed.     Yes Historical Provider, MD  pravastatin (PRAVACHOL) 40 MG tablet Take 40 mg by mouth daily.     Yes Historical Provider, MD  sulfamethoxazole-trimethoprim (BACTRIM DS) 800-160 MG per tablet Take 1 tablet by mouth 2 (two) times daily. For 14 days; Start date 05/06/12   Yes Historical Provider, MD  traMADol (ULTRAM) 50 MG tablet Take 50-100 mg by mouth every 4 (four) hours as needed. For pain   Yes Historical Provider, MD     Allergies:  Allergies  Allergen Reactions  . Penicillins  Social History:   reports that he has never smoked. He does not have any smokeless tobacco history on file. He reports that he does not drink alcohol or use illicit drugs.  Family History: Family History  Problem Relation Age of Onset  . Breast cancer Mother      Physical Exam: Filed Vitals:   05/10/12 2027  05/10/12 2130 05/10/12 2132 05/10/12 2356  BP: 147/83  134/76   Pulse: 73  77 80  Temp: 99.2 F (37.3 C)  98.7 F (37.1 C)   TempSrc: Oral  Oral   Resp: 20  18 22   Height:  5\' 10"  (1.778 m)    Weight:  194.7 kg (429 lb 3.8 oz)    SpO2: 97%  97% 97%   Blood pressure 134/76, pulse 80, temperature 98.7 F (37.1 C), temperature source Oral, resp. rate 22, height 5\' 10"  (1.778 m), weight 194.7 kg (429 lb 3.8 oz), SpO2 97.00%.  GEN:  Pleasant patient lying in the stretcher in no acute distress; cooperative with exam. He is morbidly obese PSYCH:  alert and oriented x4; does not appear anxious or depressed; affect is appropriate. HEENT: Mucous membranes pink and anicteric; PERRLA; EOM intact; no cervical lymphadenopathy nor thyromegaly or carotid bruit; no JVD; There were no stridor. Neck is very supple. Breasts:: Not examined CHEST WALL: No tenderness CHEST: Normal respiration, clear to auscultation bilaterally.  HEART: Regular rate and rhythm.  There are no murmur, rub, or gallops.   BACK: No kyphosis or scoliosis; no CVA tenderness ABDOMEN: soft and non-tender; no masses, no organomegaly, normal abdominal bowel sounds;  no intertriginous candida. There is no rebound and no distention. There is an area of an ulcer with purulent exudates and surrounding cellulitis. Rectal Exam: Not done EXTREMITIES: No bone or joint deformity; age-appropriate arthropathy of the hands and knees; no edema; no ulcerations.  There is no calf tenderness. Genitalia: not examined PULSES: 2+ and symmetric SKIN: Normal hydration no rash or ulceration CNS: Cranial nerves 2-12 grossly intact no focal lateralizing neurologic deficit.  Speech is fluent; uvula elevated with phonation, facial symmetry and tongue midline. DTR are normal bilaterally, cerebella exam is intact, barbinski is negative and strengths are equaled bilaterally.  No sensory loss.   Labs on Admission:  Basic Metabolic Panel:  Lab 05/10/12 2956  05/10/12 1456  NA -- 136  K -- 4.5  CL -- 100  CO2 -- 27  GLUCOSE -- 105*  BUN -- 10  CREATININE 1.07 1.08  CALCIUM -- 9.8  MG -- --  PHOS -- --   Liver Function Tests: No results found for this basename: AST:5,ALT:5,ALKPHOS:5,BILITOT:5,PROT:5,ALBUMIN:5 in the last 168 hours No results found for this basename: LIPASE:5,AMYLASE:5 in the last 168 hours No results found for this basename: AMMONIA:5 in the last 168 hours CBC:  Lab 05/10/12 2128 05/10/12 1456  WBC 9.5 8.2  NEUTROABS -- 4.6  HGB 13.9 14.9  HCT 42.3 45.1  MCV 86.7 87.4  PLT 249 260   Cardiac Enzymes: No results found for this basename: CKTOTAL:5,CKMB:5,CKMBINDEX:5,TROPONINI:5 in the last 168 hours  CBG: No results found for this basename: GLUCAP:5 in the last 168 hours   Radiological Exams on Admission: No results found.     Assessment/Plan Present on Admission:  .Cellulitis and abscess .OBESITY, NOS .OSA (obstructive sleep apnea)   PLAN:  He was given clindamycin, and I will add Vancomycin given his Hx of MRSA infection before.  He is very stable, and I will continue his home meds.  Will give some pain meds as well.  He uses CPAP, so I will order autotitrate CPAP here.  He is a full code and will be admitted to [facility] service. Other plans as per orders.  Code Status: FULL Unk Lightning, MD. [facility] Pager [phone] 7pm to 7am.  05/11/2012, 12:30 AM

## 2012-05-12 LAB — BASIC METABOLIC PANEL
Calcium: 9.1 mg/dL (ref 8.4–10.5)
Chloride: 104 mEq/L (ref 96–112)
Creatinine, Ser: 1.23 mg/dL (ref 0.50–1.35)

## 2012-05-12 LAB — CBC
MCV: 87.9 fL (ref 78.0–100.0)
Platelets: 249 10*3/uL (ref 150–400)
RDW: 13.7 % (ref 11.5–15.5)
WBC: 7.6 10*3/uL (ref 4.0–10.5)

## 2012-05-12 MED ORDER — DOXYCYCLINE HYCLATE 100 MG PO TABS: 100.0000 mg | ORAL_TABLET | Freq: Two times a day (BID) | ORAL | Status: DC

## 2012-05-12 NOTE — Discharge Summary (Signed)
Physician Discharge Summary  Offie Waide WUJ:[id number] DOB: 04-13-1946 DOA: 05/10/2012  PCP: Cala Bradford, MD  Admit date: 05/10/2012 Discharge date: 05/12/2012  Recommendations for Outpatient Follow-up:  1. Followup with primary care physician in one week.  Discharge Diagnoses:  Principal Problem:  *Cellulitis and abscess Active Problems:  OBESITY, NOS  OSA (obstructive sleep apnea)   Discharge Condition: Stable  Diet recommendation: Regular  Filed Weights   05/10/12 2130  Weight: 194.7 kg (429 lb 3.8 oz)    History of present illness:  Richard Sandoval is an 66 y.o. male with hx of morbid obesity, sleep apnea on CPAP, prior abdominal wall abcesses, HTN, back surgery with chronic low back pain, presents to the ER, as he had a boil on his lower left panus that ruptured and exuded purulent discharge. He denied any fever or chills, nausea or vomiting, chest pain or shortness of breath. He was started on Bactrim and Ultram by his PCP. Evaluation in the ER included a normal WBC, Hb, and renal fx tests. Hospitalist was asked to admit him for failed outpatient tx of cellulitis. He was started on clindamycin in the ER. He is allergic to PCN causing a rash.  Hospital Course:   1. Abdominal wall cellulitis/abscess: As mentioned above the patient was presented to his primary care physician office with left abdominal wall cellulitis/panniculitis and was given Bactrim and Ultram by his primary care physician. He came into the emergency department because he developed purulent discharge coming out of his left-sided abdominal wall abscess. Upon admission to the hospital here antibiotics were switched to clindamycin and vancomycin. She got one dose of clinda and then 2 doses of vancomycin and his cellulitis was improved very much. Patient was felt to be appropriate for discharge, a home health nurse will visit him at home to provide help with dressing changes to his abscess.  2. Obesity: Morbid  obesity patient was counseled extensively regarding weight loss.  3. Obstructive sleep apnea: Patient uses CPAP at home, that was continued throughout the hospital stay.   Procedures:  None  Consultations:  None  Discharge Exam: Filed Vitals:   05/11/12 0546 05/11/12 1300 05/11/12 2135 05/12/12 0542  BP:  148/86 141/83 104/75  Pulse: 70 73 60 60  Temp:  98.4 F (36.9 C) 98.6 F (37 C) 98.6 F (37 C)  TempSrc:  Oral Oral Oral  Resp:  18 20 20   Height:      Weight:      SpO2:  98% 99% 98%   General: Alert and awake, oriented x3, not in any acute distress. HEENT: anicteric sclera, pupils reactive to light and accommodation, EOMI CVS: S1-S2 clear, no murmur rubs or gallops Chest: clear to auscultation bilaterally, no wheezing, rales or rhonchi Abdomen: soft nontender, nondistended, normal bowel sounds, no organomegaly Extremities: no cyanosis, clubbing or edema noted bilaterally Neuro: Cranial nerves II-XII intact, no focal neurological deficits  Discharge Instructions  Discharge Orders    Future Orders Please Complete By Expires   Increase activity slowly          Medication List     As of 05/12/2012 12:24 PM    STOP taking these medications         sulfamethoxazole-trimethoprim 800-160 MG per tablet   Commonly known as: BACTRIM DS      TAKE these medications         aspirin 81 MG tablet   Take 81 mg by mouth daily.      clobetasol cream 0.05 %  Commonly known as: TEMOVATE   Apply 1 application topically 2 (two) times daily. Left side of abs      docusate sodium 100 MG capsule   Commonly known as: COLACE   Take 100 mg by mouth 2 (two) times daily.      doxycycline 100 MG tablet   Commonly known as: VIBRA-TABS   Take 1 tablet (100 mg total) by mouth 2 (two) times daily.      MIRALAX PO   Take by mouth as needed.      polyethylene glycol packet   Commonly known as: MIRALAX / GLYCOLAX   Take 17 g by mouth daily as needed. For constipation       pravastatin 40 MG tablet   Commonly known as: PRAVACHOL   Take 40 mg by mouth daily.      traMADol 50 MG tablet   Commonly known as: ULTRAM   Take 50-100 mg by mouth every 4 (four) hours as needed. For pain           Follow-up Information    Follow up with Cala Bradford, MD. On 05/13/2012. (Patient has a appointment tomorrow with Dr. Cliffton Asters he will just keep that appointment)    Contact information:   [address] [place] [address] [phone]           The results of significant diagnostics from this hospitalization (including imaging, microbiology, ancillary and laboratory) are listed below for reference.    Significant Diagnostic Studies: No results found.  Microbiology: Recent Results (from the past 240 hour(s))  MRSA PCR SCREENING     Status: Normal   Collection Time   05/10/12 11:48 PM      Component Value Range Status Comment   MRSA by PCR NEGATIVE  NEGATIVE Final      Labs: Basic Metabolic Panel:  Lab 05/12/12 4696 05/10/12 2128 05/10/12 1456  NA 138 -- 136  K 4.4 -- 4.5  CL 104 -- 100  CO2 27 -- 27  GLUCOSE 115* -- 105*  BUN 13 -- 10  CREATININE 1.23 1.07 1.08  CALCIUM 9.1 -- 9.8  MG -- -- --  PHOS -- -- --   Liver Function Tests: No results found for this basename: AST:5,ALT:5,ALKPHOS:5,BILITOT:5,PROT:5,ALBUMIN:5 in the last 168 hours No results found for this basename: LIPASE:5,AMYLASE:5 in the last 168 hours No results found for this basename: AMMONIA:5 in the last 168 hours CBC:  Lab 05/12/12 0605 05/10/12 2128 05/10/12 1456  WBC 7.6 9.5 8.2  NEUTROABS -- -- 4.6  HGB 13.6 13.9 14.9  HCT 41.5 42.3 45.1  MCV 87.9 86.7 87.4  PLT 249 249 260   Cardiac Enzymes: No results found for this basename: CKTOTAL:5,CKMB:5,CKMBINDEX:5,TROPONINI:5 in the last 168 hours BNP: BNP (last 3 results) No results found for this basename: PROBNP:3 in the last 8760 hours CBG: No results found for this basename: GLUCAP:5 in the last 168  hours  Time coordinating discharge: 40 minutes  Signed:  Hardie Veltre A  [facility] 05/12/2012, 12:24 PM

## 2012-05-12 NOTE — Progress Notes (Signed)
Pt. discharge to floor,verbalized understanding of discharged instruction,medication,restriction,diet and follow up appointment.Baseline Vitals sign stable,Pt comfortable,no sign and symptom of distress. 

## 2012-05-12 NOTE — Progress Notes (Signed)
   CARE MANAGEMENT NOTE 05/12/2012  Patient:  Richard Sandoval, Richard Sandoval   Account Number:  [id number]  Date Initiated:  05/11/2012  Documentation initiated by:  Letha Cape  Subjective/Objective Assessment:   dx cellulitis and abcess  admit- lives with spouse, pta independent.     Action/Plan:   Anticipated DC Date:  05/12/2012   Anticipated DC Plan:  HOME W HOME HEALTH SERVICES      DC Planning Services  CM consult      [facility]  HOME HEALTH   Choice offered to / List presented to:  C-1 Patient        HH arranged  HH-1 RN      [facility] agency  [facility]   Status of service:  Completed, signed off Medicare Important Message given?   (If response is "NO", the following Medicare IM given date fields will be blank) Date Medicare IM given:   Date Additional Medicare IM given:    Discharge Disposition:  HOME W HOME HEALTH SERVICES  Per UR Regulation:  Reviewed for med. necessity/level of care/duration of stay  If discussed at Long Length of Stay Meetings, dates discussed:    Comments:  05/12/2012 1145 Pt states he is having difficulty paying for his Doxyclycline copay. States it was $80 prior to his admission and he could not afford medication. NCM explained no program available to assist with copay. He could price check with [facility], [facility] and [facility]. Explained those pharmacies would need his insurance info to check his copay price. Son is will to take Rx and insurance info to other pharmacies to check lowest price. NCM contacted [facility] and they ran Rx and copay is $57.00. Pt states he can afford that price. Contacted [facility] to make them aware he was d/c today. Clarified wound care order for home. Pt provided number for [facility]. Isidoro Donning RN CCM Case Mgmt  05/11/12 15:25 Letha Cape RN, BSN [phone] patient lives with spoue, pta independent.  Patient chose [facility] from agency list for [facility] for wound care, referral made to [facility] for [facility], Kristen notified.  Patient for dc  tomorrow, soc will begin 24-48 hrs post discharge.

## 2012-05-13 DIAGNOSIS — L02219 Cutaneous abscess of trunk, unspecified: Secondary | ICD-10-CM | POA: Diagnosis not present

## 2012-05-20 DIAGNOSIS — L02219 Cutaneous abscess of trunk, unspecified: Secondary | ICD-10-CM | POA: Diagnosis not present

## 2012-05-20 DIAGNOSIS — L98499 Non-pressure chronic ulcer of skin of other sites with unspecified severity: Secondary | ICD-10-CM | POA: Diagnosis not present

## 2012-05-20 DIAGNOSIS — L03319 Cellulitis of trunk, unspecified: Secondary | ICD-10-CM | POA: Diagnosis not present

## 2012-06-07 DIAGNOSIS — M6530 Trigger finger, unspecified finger: Secondary | ICD-10-CM | POA: Diagnosis not present

## 2012-06-10 DIAGNOSIS — Z125 Encounter for screening for malignant neoplasm of prostate: Secondary | ICD-10-CM | POA: Diagnosis not present

## 2012-06-10 DIAGNOSIS — L98499 Non-pressure chronic ulcer of skin of other sites with unspecified severity: Secondary | ICD-10-CM | POA: Diagnosis not present

## 2012-06-10 DIAGNOSIS — E785 Hyperlipidemia, unspecified: Secondary | ICD-10-CM | POA: Diagnosis not present

## 2012-06-10 DIAGNOSIS — R030 Elevated blood-pressure reading, without diagnosis of hypertension: Secondary | ICD-10-CM | POA: Diagnosis not present

## 2012-07-27 ENCOUNTER — Other Ambulatory Visit: Payer: Self-pay | Admitting: Orthopedic Surgery

## 2012-07-30 NOTE — Progress Notes (Signed)
Pre procedure instructions for local

## 2012-08-04 ENCOUNTER — Encounter (HOSPITAL_BASED_OUTPATIENT_CLINIC_OR_DEPARTMENT_OTHER): Payer: Self-pay | Admitting: Anesthesiology

## 2012-08-04 ENCOUNTER — Encounter (HOSPITAL_BASED_OUTPATIENT_CLINIC_OR_DEPARTMENT_OTHER)
Admission: RE | Disposition: A | Discharge status: Home / Self Care | Payer: Self-pay | Source: Ambulatory Visit | Attending: Orthopedic Surgery

## 2012-08-04 ENCOUNTER — Ambulatory Visit (HOSPITAL_BASED_OUTPATIENT_CLINIC_OR_DEPARTMENT_OTHER): Payer: Managed Care, Other (non HMO) | Admitting: Anesthesiology

## 2012-08-04 ENCOUNTER — Ambulatory Visit (HOSPITAL_BASED_OUTPATIENT_CLINIC_OR_DEPARTMENT_OTHER)
Admission: RE | Admit: 2012-08-04 | Discharge: 2012-08-04 | Disposition: A | Discharge status: Home / Self Care | Payer: Managed Care, Other (non HMO) | Source: Ambulatory Visit | Attending: Orthopedic Surgery | Admitting: Orthopedic Surgery

## 2012-08-04 ENCOUNTER — Encounter (HOSPITAL_BASED_OUTPATIENT_CLINIC_OR_DEPARTMENT_OTHER): Payer: Self-pay | Admitting: Orthopedic Surgery

## 2012-08-04 DIAGNOSIS — I10 Essential (primary) hypertension: Secondary | ICD-10-CM | POA: Insufficient documentation

## 2012-08-04 DIAGNOSIS — F329 Major depressive disorder, single episode, unspecified: Secondary | ICD-10-CM | POA: Insufficient documentation

## 2012-08-04 DIAGNOSIS — M65849 Other synovitis and tenosynovitis, unspecified hand: Secondary | ICD-10-CM | POA: Insufficient documentation

## 2012-08-04 DIAGNOSIS — Z86718 Personal history of other venous thrombosis and embolism: Secondary | ICD-10-CM | POA: Insufficient documentation

## 2012-08-04 DIAGNOSIS — G4733 Obstructive sleep apnea (adult) (pediatric): Secondary | ICD-10-CM | POA: Insufficient documentation

## 2012-08-04 DIAGNOSIS — M6530 Trigger finger, unspecified finger: Secondary | ICD-10-CM | POA: Diagnosis not present

## 2012-08-04 DIAGNOSIS — M65839 Other synovitis and tenosynovitis, unspecified forearm: Secondary | ICD-10-CM | POA: Insufficient documentation

## 2012-08-04 DIAGNOSIS — F3289 Other specified depressive episodes: Secondary | ICD-10-CM | POA: Insufficient documentation

## 2012-08-04 HISTORY — PX: TRIGGER FINGER RELEASE: SHX641

## 2012-08-04 LAB — POCT I-STAT, CHEM 8
BUN: 12 mg/dL (ref 6–23)
Calcium, Ion: 1.19 mmol/L (ref 1.13–1.30)
Chloride: 103 mEq/L (ref 96–112)
Creatinine, Ser: 0.9 mg/dL (ref 0.50–1.35)
Glucose, Bld: 101 mg/dL — ABNORMAL HIGH (ref 70–99)

## 2012-08-04 SURGERY — MINOR RELEASE TRIGGER FINGER/A-1 PULLEY
Anesthesia: Monitor Anesthesia Care | Site: Thumb | Laterality: Right | Wound class: Clean

## 2012-08-04 MED ORDER — LIDOCAINE HCL (PF) 1 % IJ SOLN
INTRAMUSCULAR | Status: DC | PRN
  Administered 2012-08-04: 3 mL

## 2012-08-04 MED ORDER — LACTATED RINGERS IV SOLN: INTRAVENOUS | Status: DC

## 2012-08-04 MED ORDER — MIDAZOLAM HCL 5 MG/5ML IJ SOLN
INTRAMUSCULAR | Status: DC | PRN
  Administered 2012-08-04: 1 mg via INTRAVENOUS

## 2012-08-04 MED ORDER — HYDROCODONE-ACETAMINOPHEN 5-325 MG PO TABS: 1.0000 | ORAL_TABLET | Freq: Four times a day (QID) | ORAL | Status: DC | PRN

## 2012-08-04 MED ORDER — LACTATED RINGERS IV SOLN
INTRAVENOUS | Status: DC
  Administered 2012-08-04 (×2): via INTRAVENOUS

## 2012-08-04 MED ORDER — OXYCODONE HCL 5 MG PO TABS: 5.0000 mg | ORAL_TABLET | Freq: Once | ORAL | Status: DC | PRN

## 2012-08-04 MED ORDER — FENTANYL CITRATE 0.05 MG/ML IJ SOLN: 50.0000 ug | Freq: Once | INTRAMUSCULAR | Status: DC

## 2012-08-04 MED ORDER — PROMETHAZINE HCL 25 MG/ML IJ SOLN: 6.2500 mg | INTRAMUSCULAR | Status: DC | PRN

## 2012-08-04 MED ORDER — VANCOMYCIN HCL IN DEXTROSE 1-5 GM/200ML-% IV SOLN
1000.0000 mg | INTRAVENOUS | Status: AC
  Administered 2012-08-04: 1000 mg via INTRAVENOUS

## 2012-08-04 MED ORDER — CHLORHEXIDINE GLUCONATE 4 % EX LIQD: 60.0000 mL | Freq: Once | CUTANEOUS | Status: DC

## 2012-08-04 MED ORDER — MIDAZOLAM HCL 2 MG/2ML IJ SOLN: 1.0000 mg | INTRAMUSCULAR | Status: DC | PRN

## 2012-08-04 MED ORDER — FENTANYL CITRATE 0.05 MG/ML IJ SOLN: 25.0000 ug | INTRAMUSCULAR | Status: DC | PRN

## 2012-08-04 MED ORDER — BUPIVACAINE HCL (PF) 0.25 % IJ SOLN
INTRAMUSCULAR | Status: DC | PRN
  Administered 2012-08-04: 3 mL

## 2012-08-04 MED ORDER — FENTANYL CITRATE 0.05 MG/ML IJ SOLN
INTRAMUSCULAR | Status: DC | PRN
  Administered 2012-08-04: 50 ug via INTRAVENOUS

## 2012-08-04 MED ORDER — OXYCODONE HCL 5 MG/5ML PO SOLN: 5.0000 mg | Freq: Once | ORAL | Status: DC | PRN

## 2012-08-04 MED ORDER — PROPOFOL 10 MG/ML IV EMUL
INTRAVENOUS | Status: DC | PRN
  Administered 2012-08-04: 50 ug/kg/min via INTRAVENOUS

## 2012-08-04 SURGICAL SUPPLY — 32 items
BANDAGE COBAN STERILE 2 (GAUZE/BANDAGES/DRESSINGS) ×2 IMPLANT
BLADE SURG 15 STRL LF DISP TIS (BLADE) ×1 IMPLANT
BLADE SURG 15 STRL SS (BLADE) ×1
BNDG ESMARK 4X9 LF (GAUZE/BANDAGES/DRESSINGS) ×2 IMPLANT
CHLORAPREP W/TINT 26ML (MISCELLANEOUS) ×2 IMPLANT
CLOTH BEACON ORANGE TIMEOUT ST (SAFETY) ×2 IMPLANT
CORDS BIPOLAR (ELECTRODE) IMPLANT
COVER MAYO STAND STRL (DRAPES) ×2 IMPLANT
COVER TABLE BACK 60X90 (DRAPES) ×2 IMPLANT
CUFF TOURNIQUET SINGLE 18IN (TOURNIQUET CUFF) IMPLANT
DECANTER SPIKE VIAL GLASS SM (MISCELLANEOUS) ×2 IMPLANT
DRAPE EXTREMITY T 121X128X90 (DRAPE) ×2 IMPLANT
DRAPE SURG 17X23 STRL (DRAPES) ×2 IMPLANT
GAUZE XEROFORM 1X8 LF (GAUZE/BANDAGES/DRESSINGS) ×2 IMPLANT
GLOVE BIO SURGEON STRL SZ 6.5 (GLOVE) ×2 IMPLANT
GLOVE SURG ORTHO 8.0 STRL STRW (GLOVE) ×2 IMPLANT
GOWN BRE IMP PREV XXLGXLNG (GOWN DISPOSABLE) ×2 IMPLANT
GOWN PREVENTION PLUS XLARGE (GOWN DISPOSABLE) ×2 IMPLANT
NEEDLE 27GAX1X1/2 (NEEDLE) ×2 IMPLANT
NS IRRIG 1000ML POUR BTL (IV SOLUTION) ×2 IMPLANT
PACK BASIN DAY SURGERY FS (CUSTOM PROCEDURE TRAY) ×2 IMPLANT
PADDING CAST ABS 4INX4YD NS (CAST SUPPLIES) ×1
PADDING CAST ABS COTTON 4X4 ST (CAST SUPPLIES) ×1 IMPLANT
SPONGE GAUZE 4X4 12PLY (GAUZE/BANDAGES/DRESSINGS) ×2 IMPLANT
STOCKINETTE 4X48 STRL (DRAPES) ×2 IMPLANT
SUT VICRYL RAPID 5 0 P 3 (SUTURE) ×2 IMPLANT
SUT VICRYL RAPIDE 4/0 PS 2 (SUTURE) IMPLANT
SYR BULB 3OZ (MISCELLANEOUS) ×2 IMPLANT
SYR CONTROL 10ML LL (SYRINGE) ×2 IMPLANT
TOWEL OR 17X24 6PK STRL BLUE (TOWEL DISPOSABLE) ×2 IMPLANT
UNDERPAD 30X30 INCONTINENT (UNDERPADS AND DIAPERS) ×2 IMPLANT
WATER STERILE IRR 1000ML POUR (IV SOLUTION) ×2 IMPLANT

## 2012-08-04 NOTE — Anesthesia Preprocedure Evaluation (Addendum)
Anesthesia Evaluation  Patient identified by MRN, date of birth, ID band Patient awake    Reviewed: Allergy & Precautions, H&P , NPO status , Patient's Chart, lab work & pertinent test results  Airway Mallampati: III TM Distance: <3 FB Neck ROM: Limited    Dental   Pulmonary sleep apnea ,  breath sounds clear to auscultation        Cardiovascular hypertension, Rhythm:Regular Rate:Normal     Neuro/Psych Depression    GI/Hepatic   Endo/Other  Morbid obesity  Renal/GU      Musculoskeletal   Abdominal (+) + obese,   Peds  Hematology   Anesthesia Other Findings   Reproductive/Obstetrics                          Anesthesia Physical Anesthesia Plan  ASA: III  Anesthesia Plan: MAC   Post-op Pain Management:    Induction: Intravenous  Airway Management Planned: Simple Face Mask  Additional Equipment:   Intra-op Plan:   Post-operative Plan:   Informed Consent: I have reviewed the patients History and Physical, chart, labs and discussed the procedure including the risks, benefits and alternatives for the proposed anesthesia with the patient or authorized representative who has indicated his/her understanding and acceptance.     Plan Discussed with: CRNA and Surgeon  Anesthesia Plan Comments:         Anesthesia Quick Evaluation

## 2012-08-04 NOTE — Transfer of Care (Signed)
Immediate Anesthesia Transfer of Care Note  Patient: Richard Sandoval  Procedure(s) Performed: Procedure(s) (LRB) with comments: MINOR RELEASE TRIGGER FINGER/A-1 PULLEY (Right)  Patient Location: PACU  Anesthesia Type:MAC  Level of Consciousness: awake, alert  and oriented  Airway & Oxygen Therapy: Patient Spontanous Breathing and Patient connected to face mask oxygen  Post-op Assessment: Report given to PACU RN and Post -op Vital signs reviewed and stable  Post vital signs: Reviewed and stable  Complications: No apparent anesthesia complications

## 2012-08-04 NOTE — Op Note (Signed)
Dictated number: [id number]

## 2012-08-04 NOTE — H&P (Signed)
Richard Sandoval is a 67 year old right hand dominant male seen with  catching of his right thumb. This has been going on for approximately 2 months. He has no history of injury. No history of diabetes, thyroid problems, arthritis or gout. There is family history of gout in his father, otherwise no family history. He complains of constant moderate sharp pain with a catching. It is worse in the morning. He has in the past had to take his opposite hand and straighten it out. He complains of weakness. He states it is gradually getting worse. Activities make it worse, rest makes it better. He has tried nothing to assist this. The A-1 pulley right thumb is injected for a 2nd time without relief.  Past Medical History: He has an allergy to PCN. He takes no medicines. He has had a C-spine fusion, lower back fusion and left knee surgery.  Family Medical History: Positive for arthritis, otherwise negative.  Social History: He does not smoke or drink. He is married and disabled.  Review of Systems: Positive for glasses, blood clots, and sleep disorder, otherwise negative for 14 points [name] is an 67 y.o. male.   Chief Complaint: STS RT thum HPI: see above  Past Medical History  Diagnosis Date  . OSA (obstructive sleep apnea)   . Hypertension   . DVT (deep venous thrombosis)     L leg in 2009    Past Surgical History  Procedure Date  . Back surgery 04/09/1999  . Knee surgery 1998    Family History  Problem Relation Age of Onset  . Breast cancer Mother    Social History:  reports that he has never smoked. He does not have any smokeless tobacco history on file. He reports that he does not drink alcohol or use illicit drugs.  Allergies:  Allergies  Allergen Reactions  . Penicillins     No prescriptions prior to admission    No results found for this or any previous visit (from the past 48 hour(s)).  No results found.   Pertinent items are noted in HPI.  There were no vitals taken  for this visit.  General appearance: alert, cooperative and appears stated age Head: Normocephalic, without obvious abnormality, asymmetric shape Neck: no adenopathy Resp: clear to auscultation bilaterally Cardio: regular rate and rhythm, S1, S2 normal, no murmur, click, rub or gallop GI: soft, non-tender; bowel sounds normal; no masses,  no organomegaly Extremities: extremities normal, atraumatic, no cyanosis or edema Pulses: 2+ and symmetric Skin: Skin color, texture, turgor normal. No rashes or lesions Neurologic: Grossly normal Incision/Wound: na  Assessment/Plan .  This has settled, but has not entirely disappeared. He is offered either surgical intervention now or wait to see if it comes back and gets worse. He would like to go ahead and have this surgically released.    The pre, peri and postoperative course were discussed along with the risks and complications.  The patient is aware there is no guarantee with the surgery, possibility of infection, recurrence, injury to arteries, nerves.  This can be scheduled as an outpatient under local anesthesia, right thumb.    Shanna Un R 08/04/2012, 10:34 AM

## 2012-08-04 NOTE — Anesthesia Postprocedure Evaluation (Signed)
  Anesthesia Post-op Note  Patient: Richard Sandoval  Procedure(s) Performed: Procedure(s) (LRB) with comments: MINOR RELEASE TRIGGER FINGER/A-1 PULLEY (Right)  Patient Location: PACU  Anesthesia Type:MAC  Level of Consciousness: awake and alert   Airway and Oxygen Therapy: Patient Spontanous Breathing  Post-op Pain: mild  Post-op Assessment: Post-op Vital signs reviewed, Patient's Cardiovascular Status Stable, Respiratory Function Stable, Patent Airway, No signs of Nausea or vomiting, Adequate PO intake and Pain level controlled  Post-op Vital Signs: Reviewed and stable  Complications: No apparent anesthesia complications

## 2012-08-04 NOTE — Brief Op Note (Signed)
08/04/2012  1:44 PM  PATIENT:  Myrtie Hawk  67 y.o. male  PRE-OPERATIVE DIAGNOSIS:  RIGHT THUMB trigger finger  POST-OPERATIVE DIAGNOSIS:  right thumb trigger finger  PROCEDURE:  Procedure(s) (LRB) with comments: MINOR RELEASE TRIGGER FINGER/A-1 PULLEY (Right)  SURGEON:  Surgeon(s) and Role:    * Nicki Reaper, MD - Primary  PHYSICIAN ASSISTANT:   ASSISTANTS: none   ANESTHESIA:   local and IV sedation  EBL:     BLOOD ADMINISTERED:none  DRAINS: none   LOCAL MEDICATIONS USED:  MARCAINE    and LIDOCAINE   SPECIMEN:  No Specimen  DISPOSITION OF SPECIMEN:  N/A  COUNTS:  YES  TOURNIQUET:   Total Tourniquet Time Documented: Forearm (Right) - 7 minutes  DICTATION: .Other Dictation: Dictation Number [id number]  PLAN OF CARE: Discharge to home after PACU  PATIENT DISPOSITION:  PACU - hemodynamically stable.

## 2012-08-05 ENCOUNTER — Encounter (HOSPITAL_BASED_OUTPATIENT_CLINIC_OR_DEPARTMENT_OTHER): Payer: Self-pay | Admitting: Orthopedic Surgery

## 2012-08-05 NOTE — Op Note (Signed)
NAMEJAYSTON, TREVINO NO.:  [id number]  MEDICAL RECORD NO.:  [id number]  LOCATION:                                 FACILITY:  PHYSICIAN:  Cindee Salt, M.D.       DATE OF BIRTH:  Aug 06, 1945  DATE OF PROCEDURE:  08/04/2012 DATE OF DISCHARGE:                              OPERATIVE REPORT   PREOPERATIVE DIAGNOSIS:  Stenosing tenosynovitis, right thumb.  POSTOPERATIVE DIAGNOSIS:  Stenosing tenosynovitis, right thumb.  OPERATION:  Release of A1 pulley, right thumb.  SURGEON:  Cindee Salt, M.D.  ANESTHESIA:  Metacarpal block with IV sedation.  ANESTHESIOLOGIST:  Bedelia Person, MD  HISTORY:  The patient is a 67 year old male with history of triggering of his right thumb.  This has not responded to conservative treatment. He has elected to undergo surgical release.  Pre, peri, and postoperative course have been discussed along with risks and complications.  He is aware that there is no guarantee with the surgery; possibility of infection; recurrence of injury to arteries, nerves, tendons; incomplete relief of symptoms; dystrophy.  In the preoperative area, the patient is seen, the extremity marked by both patient and surgeon, and antibiotic given.  PROCEDURE:  The patient was brought to the operating room where an IV sedation and a local infiltration with 1% Xylocaine and Marcaine was given after he was prepped with ChloraPrep.  A tourniquet placed on the forearm was inflated to 250 mmHg.  A transverse incision was made over the A1 pulley of the right thumb, carried down through the subcutaneous tissue.  Neurovascular structures were identified and protected. Retractors were placed.  The A1 pulley was released on its radial aspect.  The tenosynovium proximally was inspected.  A second band was found over the distal portion of the metacarpal discrete from the A1 pulley.  This was a very dense transverse band over the FPL tendon. This was released, it measured  approximately 5 mm in length.  Care was taken to protect the radial digital nerve, this allowed the finger to place through a full range of motion by the patient without any further triggering.  The wound was copiously irrigated with saline.  The skin was then closed with interrupted 4-0 Vicryl Rapide sutures.  Sterile compressive dressing to the hand with the fingers and thumb free was applied.  On deflation of the tourniquet, all fingers were immediately pinked.  He was taken to the recovery room for observation in satisfactory condition.  He will be discharged to home to return to the [facility] [place] in 1 week, on Vicodin.          ______________________________ Cindee Salt, M.D.     GK/MEDQ  D:  08/04/2012  T:  08/05/2012  Job:  [id number]

## 2012-08-05 NOTE — Addendum Note (Signed)
Addendum  created 08/05/12 1435 by Lance Coon, CRNA   Modules edited:Anesthesia Responsible Staff, Charges VN

## 2012-08-05 NOTE — Addendum Note (Signed)
Addendum  created 08/05/12 1435 by [place] Larin Depaoli, CRNA   Modules edited:Anesthesia Responsible Staff, Charges VN    

## 2012-09-25 ENCOUNTER — Encounter (HOSPITAL_COMMUNITY): Payer: Self-pay | Admitting: Emergency Medicine

## 2012-09-25 ENCOUNTER — Emergency Department (HOSPITAL_COMMUNITY)
Admission: EM | Admit: 2012-09-25 | Discharge: 2012-09-25 | Disposition: A | Discharge status: Home / Self Care | Payer: Managed Care, Other (non HMO) | Attending: Emergency Medicine | Admitting: Emergency Medicine

## 2012-09-25 DIAGNOSIS — Z9989 Dependence on other enabling machines and devices: Secondary | ICD-10-CM | POA: Insufficient documentation

## 2012-09-25 DIAGNOSIS — Z79899 Other long term (current) drug therapy: Secondary | ICD-10-CM | POA: Insufficient documentation

## 2012-09-25 DIAGNOSIS — M5380 Other specified dorsopathies, site unspecified: Secondary | ICD-10-CM | POA: Insufficient documentation

## 2012-09-25 DIAGNOSIS — M545 Low back pain: Secondary | ICD-10-CM | POA: Diagnosis not present

## 2012-09-25 DIAGNOSIS — G4733 Obstructive sleep apnea (adult) (pediatric): Secondary | ICD-10-CM | POA: Insufficient documentation

## 2012-09-25 DIAGNOSIS — IMO0002 Reserved for concepts with insufficient information to code with codable children: Secondary | ICD-10-CM | POA: Insufficient documentation

## 2012-09-25 DIAGNOSIS — I10 Essential (primary) hypertension: Secondary | ICD-10-CM | POA: Diagnosis not present

## 2012-09-25 DIAGNOSIS — Y9329 Activity, other involving ice and snow: Secondary | ICD-10-CM | POA: Insufficient documentation

## 2012-09-25 DIAGNOSIS — X500XXA Overexertion from strenuous movement or load, initial encounter: Secondary | ICD-10-CM | POA: Insufficient documentation

## 2012-09-25 DIAGNOSIS — M549 Dorsalgia, unspecified: Secondary | ICD-10-CM

## 2012-09-25 DIAGNOSIS — M62838 Other muscle spasm: Secondary | ICD-10-CM

## 2012-09-25 DIAGNOSIS — Z86718 Personal history of other venous thrombosis and embolism: Secondary | ICD-10-CM | POA: Insufficient documentation

## 2012-09-25 DIAGNOSIS — Y929 Unspecified place or not applicable: Secondary | ICD-10-CM | POA: Insufficient documentation

## 2012-09-25 MED ORDER — CYCLOBENZAPRINE HCL 10 MG PO TABS
10.0000 mg | ORAL_TABLET | Freq: Once | ORAL | Status: AC
  Administered 2012-09-25: 10 mg via ORAL
  Filled 2012-09-25: qty 1

## 2012-09-25 MED ORDER — CYCLOBENZAPRINE HCL 10 MG PO TABS: 10.0000 mg | ORAL_TABLET | Freq: Two times a day (BID) | ORAL | Status: DC | PRN

## 2012-09-25 MED ORDER — IBUPROFEN 800 MG PO TABS: 800.0000 mg | ORAL_TABLET | Freq: Three times a day (TID) | ORAL | Status: DC

## 2012-09-25 MED ORDER — OXYCODONE-ACETAMINOPHEN 5-325 MG PO TABS
1.0000 | ORAL_TABLET | Freq: Once | ORAL | Status: AC
  Administered 2012-09-25: 1 via ORAL
  Filled 2012-09-25: qty 1

## 2012-09-25 MED ORDER — IBUPROFEN 400 MG PO TABS
800.0000 mg | ORAL_TABLET | Freq: Once | ORAL | Status: AC
  Administered 2012-09-25: 800 mg via ORAL
  Filled 2012-09-25: qty 2

## 2012-09-25 MED ORDER — OXYCODONE-ACETAMINOPHEN 5-325 MG PO TABS: 1.0000 | ORAL_TABLET | ORAL | Status: DC | PRN

## 2012-09-25 NOTE — ED Provider Notes (Signed)
Medical screening examination/treatment/procedure(s) were performed by non-physician practitioner and as supervising physician I was immediately available for consultation/collaboration.   Laray Anger, DO 09/25/12 2052

## 2012-09-25 NOTE — ED Notes (Signed)
Pt stated that when we had the snow he slipped on ice but did not fall and caught himself but ever since has been having lower back pain. Stated that he had L5 and L6 fusion in 2000. Pain radiates toward left leg. Pain has gradually became worse and wife had to help him get off the commode this morning. Pt stated last night while going to the bathroom it took him to get there from his bed and its about 10 feet from his bed.

## 2012-09-25 NOTE — ED Provider Notes (Signed)
History     CSN: [id number]  Arrival date & time 09/25/12  1038   First MD Initiated Contact with Patient 09/25/12 1053      Chief Complaint  Patient presents with  . Back Pain    (Consider location/radiation/quality/duration/timing/severity/associated sxs/prior treatment) Patient is a 67 y.o. male presenting with back pain. The history is provided by the patient.  Back Pain Location:  Lumbar spine Quality:  Shooting and stabbing Radiates to:  L posterior upper leg Pain severity:  Moderate Progression:  Worsening Associated symptoms: no fever, no numbness and no weakness   Associated symptoms comment:  He has recurrent episode of lower left back pain since a near fall 2 weeks ago. It is getting progressively worse, causing intermittently sharp shooting pain without numbness or weakness. No abdominal pain, urinary or bowel incontinence, no saddle anesthesia.    Past Medical History  Diagnosis Date  . DVT (deep venous thrombosis)     L leg in 2009  . Hypertension     enlarged heart  . OSA (obstructive sleep apnea)     uses cpap for 20 yrs    Past Surgical History  Procedure Laterality Date  . Back surgery  04/09/1999  . Knee surgery  1998  . Left inguinal hernia    . Lumbar laminectomy    . Cervical laminectomy and fusion    . Trigger finger release  08/04/2012    Procedure: MINOR RELEASE TRIGGER FINGER/A-1 PULLEY;  Surgeon: Nicki Reaper, MD;  Location: [facility];  Service: Orthopedics;  Laterality: Right;    Family History  Problem Relation Age of Onset  . Breast cancer Mother     History  Substance Use Topics  . Smoking status: Never Smoker   . Smokeless tobacco: Not on file  . Alcohol Use: No      Review of Systems  Constitutional: Negative for fever and chills.  Gastrointestinal: Negative.   Genitourinary: Negative.  Negative for difficulty urinating.  Musculoskeletal: Positive for back pain.       See HPI  Skin: Negative.  Negative for  wound.  Neurological: Negative.  Negative for weakness and numbness.    Allergies  Penicillins  Home Medications   Current Outpatient Rx  Name  Route  Sig  Dispense  Refill  . buPROPion (WELLBUTRIN XL) 300 MG 24 hr tablet   Oral   Take 300 mg by mouth daily.         [name] docusate sodium (COLACE) 100 MG capsule   Oral   Take 100 mg by mouth 2 (two) times daily.         [name] HYDROcodone-acetaminophen (NORCO/VICODIN) 5-325 MG per tablet   Oral   Take 1 tablet by mouth every 6 (six) hours as needed for pain.           BP 135/75  Pulse 63  Temp(Src) 97.1 F (36.2 C) (Oral)  Resp 18  SpO2 99%  Physical Exam  Constitutional: He is oriented to person, place, and time. He appears well-developed and well-nourished.  Neck: Normal range of motion.  Pulmonary/Chest: Effort normal.  Abdominal: Soft. He exhibits no mass. There is no tenderness.  Musculoskeletal: Normal range of motion. He exhibits no edema.  Right paralumbar tenderness without swelling, discoloration. No sciatic tenderness on right. Distal pulses 2+.  Neurological: He is alert and oriented to person, place, and time. He has normal reflexes. No sensory deficit.  Skin: Skin is warm and dry.  Psychiatric: He has  a normal mood and affect.    ED Course  Procedures (including critical care time)  Labs Reviewed - No data to display No results found.   No diagnosis found.  1. Back pain 2. Muscle spasm   MDM  Uncomplicated back pain without neurologic deficits.         Arnoldo Hooker, PA-C 09/25/12 1105

## 2013-02-10 DIAGNOSIS — M47812 Spondylosis without myelopathy or radiculopathy, cervical region: Secondary | ICD-10-CM | POA: Diagnosis not present

## 2013-02-23 DIAGNOSIS — F329 Major depressive disorder, single episode, unspecified: Secondary | ICD-10-CM | POA: Diagnosis not present

## 2013-02-23 DIAGNOSIS — E785 Hyperlipidemia, unspecified: Secondary | ICD-10-CM | POA: Diagnosis not present

## 2013-02-23 DIAGNOSIS — R609 Edema, unspecified: Secondary | ICD-10-CM | POA: Diagnosis not present

## 2013-05-23 DIAGNOSIS — Z23 Encounter for immunization: Secondary | ICD-10-CM | POA: Diagnosis not present

## 2013-05-23 DIAGNOSIS — Z0000 Encounter for general adult medical examination without abnormal findings: Secondary | ICD-10-CM | POA: Diagnosis not present

## 2013-05-23 DIAGNOSIS — H579 Unspecified disorder of eye and adnexa: Secondary | ICD-10-CM | POA: Diagnosis not present

## 2013-05-23 DIAGNOSIS — F329 Major depressive disorder, single episode, unspecified: Secondary | ICD-10-CM | POA: Diagnosis not present

## 2013-05-23 DIAGNOSIS — E785 Hyperlipidemia, unspecified: Secondary | ICD-10-CM | POA: Diagnosis not present

## 2013-09-12 ENCOUNTER — Ambulatory Visit
Admission: RE | Admit: 2013-09-12 | Discharge: 2013-09-12 | Disposition: A | Discharge status: Home / Self Care | Payer: Managed Care, Other (non HMO) | Source: Ambulatory Visit | Attending: Family Medicine | Admitting: Family Medicine

## 2013-09-12 ENCOUNTER — Other Ambulatory Visit: Payer: Self-pay | Admitting: Family Medicine

## 2013-09-12 DIAGNOSIS — Z86718 Personal history of other venous thrombosis and embolism: Secondary | ICD-10-CM

## 2013-09-12 DIAGNOSIS — M7989 Other specified soft tissue disorders: Secondary | ICD-10-CM

## 2013-09-12 DIAGNOSIS — M79604 Pain in right leg: Secondary | ICD-10-CM

## 2013-09-12 DIAGNOSIS — R0602 Shortness of breath: Secondary | ICD-10-CM

## 2013-09-20 ENCOUNTER — Institutional Professional Consult (permissible substitution): Payer: Managed Care, Other (non HMO) | Admitting: Internal Medicine

## 2013-09-21 DIAGNOSIS — R0602 Shortness of breath: Secondary | ICD-10-CM | POA: Diagnosis not present

## 2013-09-21 DIAGNOSIS — L02818 Cutaneous abscess of other sites: Secondary | ICD-10-CM | POA: Diagnosis not present

## 2013-09-21 DIAGNOSIS — L03818 Cellulitis of other sites: Secondary | ICD-10-CM | POA: Diagnosis not present

## 2013-09-27 ENCOUNTER — Encounter: Payer: Self-pay | Admitting: Internal Medicine

## 2013-09-27 ENCOUNTER — Ambulatory Visit (INDEPENDENT_AMBULATORY_CARE_PROVIDER_SITE_OTHER): Payer: Managed Care, Other (non HMO) | Admitting: Internal Medicine

## 2013-09-27 VITALS — BP 112/80 | HR 77 | Temp 97.8°F | Ht 70.5 in | Wt >= 6400 oz

## 2013-09-27 DIAGNOSIS — R059 Cough, unspecified: Secondary | ICD-10-CM | POA: Diagnosis not present

## 2013-09-27 DIAGNOSIS — R058 Other specified cough: Secondary | ICD-10-CM | POA: Insufficient documentation

## 2013-09-27 DIAGNOSIS — R05 Cough: Secondary | ICD-10-CM | POA: Diagnosis not present

## 2013-09-27 NOTE — Patient Instructions (Signed)
Pepcid ac 20 mg after supper along with chlortrimeton 4 mg one after supper   GERD (REFLUX)  is an extremely common cause of respiratory symptoms, many times with no significant heartburn at all.    It can be treated with medication, but also with lifestyle changes including avoidance of late meals, excessive alcohol, smoking cessation, and avoid fatty foods, chocolate, peppermint, colas, red wine, and acidic juices such as orange juice.  NO MINT OR MENTHOL PRODUCTS SO NO COUGH DROPS  USE SUGARLESS CANDY INSTEAD (jolley ranchers or Stover's and life savers)  NO OIL BASED VITAMINS - use powdered substitutes.  Please schedule a follow up office visit in 4 weeks, sooner if needed with pfts on return

## 2013-09-27 NOTE — Progress Notes (Signed)
   Subjective:    Patient ID: Myrtie HawkLarry Hoch, male    DOB: July 27, 1946  MRN: [id number]  HPI  89 yobm never smoker with MO complicated by OSA on cpap x around 1995 referred to pulmonary clinic 09/27/2013 by Dr [name] for eval of new cough sice around Christmas 2014  09/27/2013 1st [facility] office visit/ Nilson Tabora cc new onset cough like a gagging sensation x 2 months comes and goes but happens every day worse when in recliner after supper never brings up anything. Sob is baseline unless coughing.  No better p zyrtec. Onset was insidious, pattern is persistent.  No obvious other patterns in day to day or daytime variabilty or assoc   cp or chest tightness, subjective wheeze overt sinus or hb symptoms. No unusual exp hx or h/o childhood pna/ asthma or knowledge of premature birth.  Sleeping ok without nocturnal  or early am exacerbation  of respiratory  c/o's or need for noct saba. Also denies any obvious fluctuation of symptoms with weather or environmental changes or other aggravating or alleviating factors except as outlined above   Current Medications, Allergies, Complete Past Medical History, Past Surgical History, Family History, and Social History were reviewed in [facility] electronic medical record.             Review of Systems  Constitutional: Negative for fever, chills, activity change, appetite change and unexpected weight change.  HENT: Negative for congestion, dental problem, postnasal drip, rhinorrhea, sneezing, sore throat, trouble swallowing and voice change.   Eyes: Negative for visual disturbance.  Respiratory: Positive for cough and shortness of breath. Negative for choking.   Cardiovascular: Negative for chest pain and leg swelling.  Gastrointestinal: Negative for nausea, vomiting and abdominal pain.  Genitourinary: Negative for difficulty urinating.  Musculoskeletal: Negative for arthralgias.  Skin: Negative for rash.  Psychiatric/Behavioral: Negative for  behavioral problems and confusion.       Objective:   Physical Exam   Wt Readings from Last 3 Encounters:  09/27/13 440 lb (199.583 kg)  08/04/12 435 lb (197.315 kg)  08/04/12 435 lb (197.315 kg)    Pleasant massively obese  bm nad   HEENT: nl dentition, turbinates, and orophanx. Nl external ear canals without cough reflex   NECK :  without JVD/Nodes/TM/ nl carotid upstrokes bilaterally   LUNGS: no acc muscle use, clear to A and P bilaterally without cough on insp or exp maneuvers   CV:  RRR  no s3 or murmur or increase in P2, no edema   ABD:  soft and nontender with nl excursion in the supine position. No bruits or organomegaly, bowel sounds nl  MS:  warm without deformities, calf tenderness, cyanosis or clubbing  SKIN: warm and dry without lesions    NEURO:  alert, approp, no deficits     cxr 09/12/13 Mild bronchitic changes but no acute infiltrates          Assessment & Plan:

## 2013-09-29 ENCOUNTER — Encounter: Payer: Self-pay | Admitting: Internal Medicine

## 2013-09-29 NOTE — Assessment & Plan Note (Signed)
The most common causes of chronic cough in immunocompetent adults include the following: upper airway cough syndrome (UACS), previously referred to as postnasal drip syndrome (PNDS), which is caused by variety of rhinosinus conditions; (2) asthma; (3) GERD; (4) chronic bronchitis from cigarette smoking or other inhaled environmental irritants; (5) nonasthmatic eosinophilic bronchitis; and (6) bronchiectasis.   These conditions, singly or in combination, have accounted for up to 94% of the causes of chronic cough in prospective studies.   Other conditions have constituted no >6% of the causes in prospective studies These have included bronchogenic carcinoma, chronic interstitial pneumonia, sarcoidosis, left ventricular failure, ACEI-induced cough, and aspiration from a condition associated with pharyngeal dysfunction.    Chronic cough is often simultaneously caused by more than one condition. A single cause has been found from 38 to 82% of the time, multiple causes from 18 to 62%. Multiply caused cough has been the result of three diseases up to 42% of the time.       Most likely this is  Classic Upper airway cough syndrome, so named because it's frequently impossible to sort out how much is  CR/sinusitis with freq throat clearing (which can be related to primary GERD)   vs  causing  secondary (" extra esophageal")  GERD from wide swings in gastric pressure that occur with throat clearing, often  promoting self use of mint and menthol lozenges that reduce the lower esophageal sphincter tone and exacerbate the problem further in a cyclical fashion.   These are the same pts (now being labeled as having "irritable larynx syndrome" by some cough centers) who not infrequently have a history of having failed to tolerate ace inhibitors,  dry powder inhalers or biphosphonates or report having atypical reflux symptoms that don't respond to standard doses of PPI , and are easily confused as having aecopd or asthma  flares by even experienced allergists/ pulmonologists.   For now try rx with h1 and h2 p supper to see what impact this has

## 2013-10-31 ENCOUNTER — Ambulatory Visit: Payer: Managed Care, Other (non HMO) | Admitting: Internal Medicine

## 2013-11-25 ENCOUNTER — Ambulatory Visit: Payer: Managed Care, Other (non HMO) | Admitting: Internal Medicine

## 2013-12-14 ENCOUNTER — Encounter: Payer: Self-pay | Admitting: Internal Medicine

## 2013-12-14 ENCOUNTER — Ambulatory Visit (INDEPENDENT_AMBULATORY_CARE_PROVIDER_SITE_OTHER): Payer: Managed Care, Other (non HMO) | Admitting: Internal Medicine

## 2013-12-14 VITALS — BP 126/74 | HR 68 | Ht 70.5 in | Wt >= 6400 oz

## 2013-12-14 DIAGNOSIS — R059 Cough, unspecified: Secondary | ICD-10-CM

## 2013-12-14 DIAGNOSIS — R0609 Other forms of dyspnea: Secondary | ICD-10-CM | POA: Diagnosis not present

## 2013-12-14 DIAGNOSIS — R0989 Other specified symptoms and signs involving the circulatory and respiratory systems: Secondary | ICD-10-CM

## 2013-12-14 DIAGNOSIS — R0600 Dyspnea, unspecified: Secondary | ICD-10-CM

## 2013-12-14 DIAGNOSIS — R05 Cough: Secondary | ICD-10-CM | POA: Diagnosis not present

## 2013-12-14 DIAGNOSIS — E669 Obesity, unspecified: Secondary | ICD-10-CM | POA: Diagnosis not present

## 2013-12-14 NOTE — Progress Notes (Signed)
Subjective:    Patient ID: Richard Sandoval, male    DOB: May 18, 1946  MRN: [id number]    Brief patient profile:  [age] yobm never smoker with MO complicated by OSA on cpap x around 1995 referred to pulmonary clinic 09/27/2013 by Dr Richard Sandoval for eval of new cough sice around Christmas 2014   History of Present Illness  09/27/2013 1st [name]/ Richard Sandoval cc new onset cough like a gagging sensation x 2 months comes and goes but happens every day worse when in recliner after supper never brings up anything. Sob is baseline unless coughing.  No better p zyrtec. Onset was insidious, pattern is persistent. Rec Pepcid ac 20 mg after supper along with chlortrimeton 4 mg one after supper  GERD diet.     12/14/2013 f/u ov/Richard Sandoval re:  Doe x long corridor slow pace x 2-3 years,  Cough sev years but comes and goes and no longer present p supper in recliner resolved on above rx but always present with temp change x years and almost constant urge to clear throat but daytime only for decades, not using zyrtec as rec, uses lots of mints and cough drops instead.  No obvious other patterns inday to day or daytime variabilty or assoc cp or chest tightness, subjective wheeze overt sinus or hb symptoms. No unusual exp hx or h/o childhood pna/ asthma or knowledge of premature birth.  Sleeping ok without nocturnal  or early am exacerbation  of respiratory  c/o's or need for noct saba. Also denies any obvious fluctuation of symptoms with weather or environmental changes or other aggravating or alleviating factors except as outlined above   Current Medications, Allergies, Complete Past Medical History, Past Surgical History, Family History, and Social History were reviewed in [facility] electronic medical record.  ROS  The following are not active complaints unless bolded sore throat, dysphagia, dental problems, itching, sneezing,  nasal congestion or excess/ purulent secretions, ear ache,   fever, chills,  sweats, unintended wt loss, pleuritic or exertional cp, hemoptysis,  orthopnea pnd or leg swelling, presyncope, palpitations, heartburn, abdominal pain, anorexia, nausea, vomiting, diarrhea  or change in bowel or urinary habits, change in stools or urine, dysuria,hematuria,  rash, arthralgias, visual complaints, headache, numbness weakness or ataxia or problems with walking or coordination,  change in mood/affect or memory.                         Objective:   Physical Exam  12/14/2013        443  Wt Readings from Last 3 Encounters:  09/27/13 440 lb (199.583 kg)  08/04/12 435 lb (197.315 kg)  08/04/12 435 lb (197.315 kg)    Pleasant massively obese  bm nad with freq throat clearing    HEENT: nl dentition, turbinates, and orophanx which is pristine. Nl external ear canals without cough reflex   NECK :  without JVD/Nodes/TM/ nl carotid upstrokes bilaterally   LUNGS: no acc muscle use, clear to A and P bilaterally without cough on insp or exp maneuvers   CV:  RRR  no s3 or murmur or increase in P2, no edema   ABD:  soft and nontender with nl excursion in the supine position. No bruits or organomegaly, bowel sounds nl  MS:  warm without deformities, calf tenderness, cyanosis or clubbing  SKIN: warm and dry without lesions    NEURO:  alert, approp, no deficits     cxr 09/12/13 Mild bronchitic  changes but no acute infiltrates          Assessment & Plan:

## 2013-12-14 NOTE — Patient Instructions (Addendum)
The best treatment for your condition sugarless candy  If the throat continues to bother you :  Try prilosec 20mg   Take 30-60 min before first meal of the day and Pepcid 20 mg one bedtime with  zyrtrec  X 2 weeks and if not better continue the medication as is but [name] at [phone] for asthma challenge test

## 2013-12-14 NOTE — Progress Notes (Signed)
PFT done today. 

## 2013-12-15 DIAGNOSIS — R0600 Dyspnea, unspecified: Secondary | ICD-10-CM | POA: Insufficient documentation

## 2013-12-15 LAB — PULMONARY FUNCTION TEST
DL/VA % pred: 113 %
DL/VA: 5.28 ml/min/mmHg/L
DLCO UNC % PRED: 81 %
DLCO UNC: 27.07 ml/min/mmHg
FEF 25-75 POST: 1.88 L/s
FEF 25-75 Pre: 2.11 L/sec
FEF2575-%Change-Post: -10 %
FEF2575-%Pred-Post: 70 %
FEF2575-%Pred-Pre: 79 %
FEV1-%Change-Post: -2 %
FEV1-%PRED-POST: 80 %
FEV1-%Pred-Pre: 82 %
FEV1-POST: 2.43 L
FEV1-Pre: 2.49 L
FEV1FVC-%CHANGE-POST: -1 %
FEV1FVC-%Pred-Pre: 100 %
FEV6-%Change-Post: 0 %
FEV6-%PRED-PRE: 84 %
FEV6-%Pred-Post: 83 %
FEV6-POST: 3.18 L
FEV6-PRE: 3.2 L
FEV6FVC-%CHANGE-POST: 0 %
FEV6FVC-%PRED-POST: 103 %
FEV6FVC-%Pred-Pre: 103 %
FVC-%CHANGE-POST: 0 %
FVC-%PRED-POST: 80 %
FVC-%PRED-PRE: 81 %
FVC-PRE: 3.22 L
FVC-Post: 3.2 L
POST FEV6/FVC RATIO: 99 %
PRE FEV1/FVC RATIO: 77 %
PRE FEV6/FVC RATIO: 99 %
Post FEV1/FVC ratio: 76 %
RV % PRED: 87 %
RV: 2.11 L
TLC % PRED: 84 %
TLC: 6.03 L

## 2013-12-15 NOTE — Assessment & Plan Note (Signed)
pfts 12/14/13 nl except for low ERV c/w effects of obesity  Reviewed pfts and likely etiology for chronic doe that has worsened with wt gain and likely will only improve with wt loss

## 2013-12-15 NOTE — Assessment & Plan Note (Signed)
Classic Upper airway cough syndrome, so named because it's frequently impossible to sort out how much is  CR/sinusitis with freq throat clearing (which can be related to primary GERD)   vs  causing  secondary (" extra esophageal")  GERD from wide swings in gastric pressure that occur with throat clearing, often  promoting self use of mint and menthol lozenges that reduce the lower esophageal sphincter tone and exacerbate the problem further in a cyclical fashion.   These are the same pts (now being labeled as having "irritable larynx syndrome" by some cough centers) who not infrequently have a history of having failed to tolerate ace inhibitors,  dry powder inhalers or biphosphonates or report having atypical reflux symptoms that don't respond to standard doses of PPI , and are easily confused as having aecopd or asthma flares by even experienced allergists/ pulmonologists.  rec maint max gerd rx x one month and if no better  then do MCT if no better and consider adding neurontin 100 tid on a trial basis for irritable larynx syndrome strongly supported by absence of noct symptoms and chronicity.

## 2013-12-20 DIAGNOSIS — F329 Major depressive disorder, single episode, unspecified: Secondary | ICD-10-CM | POA: Diagnosis not present

## 2013-12-20 DIAGNOSIS — L738 Other specified follicular disorders: Secondary | ICD-10-CM | POA: Diagnosis not present

## 2013-12-20 DIAGNOSIS — M5030 Other cervical disc degeneration, unspecified cervical region: Secondary | ICD-10-CM | POA: Diagnosis not present

## 2013-12-20 DIAGNOSIS — R0602 Shortness of breath: Secondary | ICD-10-CM | POA: Diagnosis not present

## 2013-12-20 DIAGNOSIS — L678 Other hair color and hair shaft abnormalities: Secondary | ICD-10-CM | POA: Diagnosis not present

## 2013-12-20 DIAGNOSIS — M5137 Other intervertebral disc degeneration, lumbosacral region: Secondary | ICD-10-CM | POA: Diagnosis not present

## 2014-01-05 ENCOUNTER — Inpatient Hospital Stay (HOSPITAL_COMMUNITY): Payer: Managed Care, Other (non HMO)

## 2014-01-05 ENCOUNTER — Emergency Department (HOSPITAL_COMMUNITY): Payer: Managed Care, Other (non HMO)

## 2014-01-05 ENCOUNTER — Inpatient Hospital Stay (HOSPITAL_COMMUNITY)
Admission: EM | Admit: 2014-01-05 | Discharge: 2014-01-11 | DRG: 439 | Disposition: A | Discharge status: Home / Self Care | Payer: Managed Care, Other (non HMO) | Attending: Family Medicine | Admitting: Family Medicine

## 2014-01-05 ENCOUNTER — Encounter (HOSPITAL_COMMUNITY): Payer: Self-pay | Admitting: Emergency Medicine

## 2014-01-05 DIAGNOSIS — M545 Low back pain, unspecified: Secondary | ICD-10-CM

## 2014-01-05 DIAGNOSIS — R112 Nausea with vomiting, unspecified: Secondary | ICD-10-CM | POA: Diagnosis not present

## 2014-01-05 DIAGNOSIS — R079 Chest pain, unspecified: Secondary | ICD-10-CM | POA: Diagnosis not present

## 2014-01-05 DIAGNOSIS — Z6841 Body Mass Index (BMI) 40.0 and over, adult: Secondary | ICD-10-CM

## 2014-01-05 DIAGNOSIS — R740 Nonspecific elevation of levels of transaminase and lactic acid dehydrogenase [LDH]: Secondary | ICD-10-CM

## 2014-01-05 DIAGNOSIS — R0600 Dyspnea, unspecified: Secondary | ICD-10-CM

## 2014-01-05 DIAGNOSIS — Z803 Family history of malignant neoplasm of breast: Secondary | ICD-10-CM | POA: Diagnosis not present

## 2014-01-05 DIAGNOSIS — F3289 Other specified depressive episodes: Secondary | ICD-10-CM | POA: Diagnosis present

## 2014-01-05 DIAGNOSIS — Z7982 Long term (current) use of aspirin: Secondary | ICD-10-CM | POA: Diagnosis not present

## 2014-01-05 DIAGNOSIS — F329 Major depressive disorder, single episode, unspecified: Secondary | ICD-10-CM | POA: Diagnosis present

## 2014-01-05 DIAGNOSIS — K859 Acute pancreatitis without necrosis or infection, unspecified: Principal | ICD-10-CM | POA: Diagnosis present

## 2014-01-05 DIAGNOSIS — R058 Other specified cough: Secondary | ICD-10-CM

## 2014-01-05 DIAGNOSIS — R0609 Other forms of dyspnea: Secondary | ICD-10-CM

## 2014-01-05 DIAGNOSIS — R0602 Shortness of breath: Secondary | ICD-10-CM | POA: Diagnosis present

## 2014-01-05 DIAGNOSIS — R7402 Elevation of levels of lactic acid dehydrogenase (LDH): Secondary | ICD-10-CM | POA: Diagnosis not present

## 2014-01-05 DIAGNOSIS — R748 Abnormal levels of other serum enzymes: Secondary | ICD-10-CM | POA: Diagnosis present

## 2014-01-05 DIAGNOSIS — E669 Obesity, unspecified: Secondary | ICD-10-CM

## 2014-01-05 DIAGNOSIS — R0989 Other specified symptoms and signs involving the circulatory and respiratory systems: Secondary | ICD-10-CM | POA: Diagnosis not present

## 2014-01-05 DIAGNOSIS — G4733 Obstructive sleep apnea (adult) (pediatric): Secondary | ICD-10-CM | POA: Diagnosis present

## 2014-01-05 DIAGNOSIS — L0390 Cellulitis, unspecified: Secondary | ICD-10-CM

## 2014-01-05 DIAGNOSIS — Z86718 Personal history of other venous thrombosis and embolism: Secondary | ICD-10-CM

## 2014-01-05 DIAGNOSIS — I10 Essential (primary) hypertension: Secondary | ICD-10-CM | POA: Diagnosis present

## 2014-01-05 DIAGNOSIS — L0291 Cutaneous abscess, unspecified: Secondary | ICD-10-CM

## 2014-01-05 DIAGNOSIS — R05 Cough: Secondary | ICD-10-CM

## 2014-01-05 DIAGNOSIS — R109 Unspecified abdominal pain: Secondary | ICD-10-CM | POA: Diagnosis not present

## 2014-01-05 DIAGNOSIS — R7401 Elevation of levels of liver transaminase levels: Secondary | ICD-10-CM | POA: Diagnosis present

## 2014-01-05 LAB — COMPREHENSIVE METABOLIC PANEL
ALT: 125 U/L — ABNORMAL HIGH (ref 0–53)
AST: 209 U/L — AB (ref 0–37)
Albumin: 3.9 g/dL (ref 3.5–5.2)
Alkaline Phosphatase: 105 U/L (ref 39–117)
BILIRUBIN TOTAL: 2.4 mg/dL — AB (ref 0.3–1.2)
BUN: 13 mg/dL (ref 6–23)
CALCIUM: 9.6 mg/dL (ref 8.4–10.5)
CHLORIDE: 101 meq/L (ref 96–112)
CO2: 24 meq/L (ref 19–32)
CREATININE: 0.94 mg/dL (ref 0.50–1.35)
GFR, EST NON AFRICAN AMERICAN: 85 mL/min — AB (ref >90)
GLUCOSE: 158 mg/dL — AB (ref 70–99)
Potassium: 4.1 mEq/L (ref 3.7–5.3)
Sodium: 140 mEq/L (ref 137–147)
Total Protein: 8.4 g/dL — ABNORMAL HIGH (ref 6.0–8.3)

## 2014-01-05 LAB — PRO B NATRIURETIC PEPTIDE: Pro B Natriuretic peptide (BNP): 209.9 pg/mL — ABNORMAL HIGH (ref 0–125)

## 2014-01-05 LAB — CBC
HEMATOCRIT: 47 % (ref 39.0–52.0)
Hemoglobin: 15.5 g/dL (ref 13.0–17.0)
MCH: 28.8 pg (ref 26.0–34.0)
MCHC: 33 g/dL (ref 30.0–36.0)
MCV: 87.4 fL (ref 78.0–100.0)
PLATELETS: 180 10*3/uL (ref 150–400)
RBC: 5.38 MIL/uL (ref 4.22–5.81)
RDW: 13.4 % (ref 11.5–15.5)
WBC: 11.1 10*3/uL — ABNORMAL HIGH (ref 4.0–10.5)

## 2014-01-05 LAB — TRIGLYCERIDES: Triglycerides: 40 mg/dL (ref <150)

## 2014-01-05 LAB — TROPONIN I: Troponin I: <0.3 ng/mL (ref <0.30)

## 2014-01-05 LAB — LIPASE, BLOOD: Lipase: >3000 U/L — ABNORMAL HIGH (ref 11–59)

## 2014-01-05 LAB — I-STAT TROPONIN, ED: Troponin i, poc: 0 ng/mL (ref 0.00–0.08)

## 2014-01-05 MED ORDER — PANTOPRAZOLE SODIUM 40 MG IV SOLR
40.0000 mg | Freq: Two times a day (BID) | INTRAVENOUS | Status: DC
  Administered 2014-01-05 – 2014-01-10 (×10): 40 mg via INTRAVENOUS
  Filled 2014-01-05 (×11): qty 40

## 2014-01-05 MED ORDER — SODIUM CHLORIDE 0.9 % IV SOLN
INTRAVENOUS | Status: DC
  Administered 2014-01-05 – 2014-01-06 (×3): via INTRAVENOUS

## 2014-01-05 MED ORDER — HYDROMORPHONE HCL PF 1 MG/ML IJ SOLN: 1.0000 mg | INTRAMUSCULAR | Status: DC | PRN

## 2014-01-05 MED ORDER — ONDANSETRON HCL 4 MG PO TABS: 4.0000 mg | ORAL_TABLET | Freq: Four times a day (QID) | ORAL | Status: DC | PRN

## 2014-01-05 MED ORDER — HYDROCODONE-ACETAMINOPHEN 5-325 MG PO TABS
1.0000 | ORAL_TABLET | ORAL | Status: DC | PRN
  Filled 2014-01-05: qty 1

## 2014-01-05 MED ORDER — MUPIROCIN 2 % EX OINT
1.0000 "application " | TOPICAL_OINTMENT | Freq: Three times a day (TID) | CUTANEOUS | Status: DC
  Administered 2014-01-05 – 2014-01-11 (×16): 1 via NASAL
  Filled 2014-01-05: qty 22

## 2014-01-05 MED ORDER — ONDANSETRON HCL 4 MG/2ML IJ SOLN
4.0000 mg | Freq: Once | INTRAMUSCULAR | Status: AC
  Administered 2014-01-05: 4 mg via INTRAVENOUS
  Filled 2014-01-05: qty 2

## 2014-01-05 MED ORDER — SODIUM CHLORIDE 0.9 % IV BOLUS (SEPSIS)
500.0000 mL | Freq: Once | INTRAVENOUS | Status: AC
  Administered 2014-01-05: 500 mL via INTRAVENOUS

## 2014-01-05 MED ORDER — ENOXAPARIN SODIUM 40 MG/0.4ML ~~LOC~~ SOLN
40.0000 mg | SUBCUTANEOUS | Status: DC
  Administered 2014-01-05 – 2014-01-06 (×2): 40 mg via SUBCUTANEOUS
  Filled 2014-01-05 (×3): qty 0.4

## 2014-01-05 MED ORDER — MORPHINE SULFATE 2 MG/ML IJ SOLN
1.0000 mg | INTRAMUSCULAR | Status: DC | PRN
  Administered 2014-01-05 – 2014-01-07 (×8): 1 mg via INTRAVENOUS
  Filled 2014-01-05 (×8): qty 1

## 2014-01-05 MED ORDER — HYDRALAZINE HCL 20 MG/ML IJ SOLN
10.0000 mg | Freq: Three times a day (TID) | INTRAMUSCULAR | Status: DC | PRN
  Administered 2014-01-07 – 2014-01-09 (×4): 10 mg via INTRAVENOUS
  Filled 2014-01-05 (×5): qty 0.5

## 2014-01-05 MED ORDER — ONDANSETRON HCL 4 MG/2ML IJ SOLN: 4.0000 mg | Freq: Three times a day (TID) | INTRAMUSCULAR | Status: DC | PRN

## 2014-01-05 MED ORDER — HYDROMORPHONE HCL PF 1 MG/ML IJ SOLN
1.0000 mg | Freq: Once | INTRAMUSCULAR | Status: AC
  Administered 2014-01-05: 1 mg via INTRAVENOUS
  Filled 2014-01-05: qty 1

## 2014-01-05 MED ORDER — METOCLOPRAMIDE HCL 5 MG/ML IJ SOLN
10.0000 mg | Freq: Once | INTRAMUSCULAR | Status: AC
  Administered 2014-01-05: 10 mg via INTRAVENOUS
  Filled 2014-01-05: qty 2

## 2014-01-05 MED ORDER — ONDANSETRON HCL 4 MG/2ML IJ SOLN
4.0000 mg | Freq: Four times a day (QID) | INTRAMUSCULAR | Status: DC | PRN
  Administered 2014-01-06 – 2014-01-10 (×6): 4 mg via INTRAVENOUS
  Filled 2014-01-05 (×6): qty 2

## 2014-01-05 MED ORDER — SODIUM CHLORIDE 0.9 % IV SOLN
INTRAVENOUS | Status: DC
  Administered 2014-01-05: 18:00:00 via INTRAVENOUS

## 2014-01-05 NOTE — ED Notes (Signed)
Pt c/o epigastric pain w/ SOB.  States that he knows he is dehydrated because he is nauseated.  When asked if he has been having vomiting or diarrhea, pt states that he has only had dry heaves and normal stools.  States he has an enlarged heart.

## 2014-01-05 NOTE — Progress Notes (Signed)
Utilization Review completed.  Rogers Ditter RN CM  

## 2014-01-05 NOTE — H&P (Signed)
[facility] History and Physical  Christion Strabala MOQ:[id number] DOB: [birth year] DOA: 01/05/2014  Referring physician: Dr Blinda Leatherwood.  PCP: Cala Bradford, MD   Chief Complaint: Abdominal pain.   HPI: Richard Sandoval is a 68 y.o. male with PMH significant for Hypertension not on medications, OSA, who presents to ED complaining of abdominal, nausea and SOB that started the morning of admission. He relates abdominal pain, sharp in quality, squeezing in quality, 10/10, started initially lower quadrant, then radiate to mid abdomen. It was associated with dyspnea and nausea. He denies alcohol intake, no new medications. He has had 3 MB today soft stool. He is breathing better,he denies chest pain.    Review of Systems:  Negative, excepts as per HPI.   Past Medical History  Diagnosis Date  . DVT (deep venous thrombosis)     L leg in 2009  . Hypertension     enlarged heart  . OSA (obstructive sleep apnea)     on CPAP since 1995   Past Surgical History  Procedure Laterality Date  . Back surgery  04/09/1999  . Knee surgery  1998  . Left inguinal hernia    . Lumbar laminectomy    . Cervical laminectomy and fusion    . Trigger finger release  08/04/2012    Procedure: MINOR RELEASE TRIGGER FINGER/A-1 PULLEY;  Surgeon: Nicki Reaper, MD;  Location: [facility];  Service: Orthopedics;  Laterality: Right;   Social History:  reports that he has never smoked. He has never used smokeless tobacco. He reports that he does not drink alcohol or use illicit drugs.  Allergies  Allergen Reactions  . Penicillins Itching    Family History  Problem Relation Age of Onset  . Breast cancer Mother      Prior to Admission medications   Medication Sig Start Date End Date Taking? Authorizing Provider  aspirin 81 MG tablet Take 81 mg by mouth daily.   Yes Historical Provider, MD  cetirizine (ZYRTEC) 10 MG tablet Take 10 mg by mouth daily.   Yes Historical Provider, MD  FLUoxetine (PROZAC) 20  MG tablet Take 20 mg by mouth daily.   Yes Historical Provider, MD  ibuprofen (ADVIL,MOTRIN) 200 MG tablet Take 400 mg by mouth every 6 (six) hours as needed (pain).   Yes Historical Provider, MD  mupirocin ointment (BACTROBAN) 2 % Place 1 application into the nose 3 (three) times daily.   Yes Historical Provider, MD   Physical Exam: Filed Vitals:   01/05/14 1633  BP: 184/106  Pulse:   Temp:   Resp:     BP 184/106  Pulse 82  Temp(Src) 98.7 F (37.1 C) (Oral)  Resp 23  SpO2 97%  General:  Appears calm and comfortable, morbid obese.  Eyes: PERRL, normal lids, irises & conjunctiva ENT: grossly normal hearing, lips & tongue Neck: no LAD, masses or thyromegaly Cardiovascular: RRR, no m/r/g. No LE edema. Respiratory: CTA bilaterally, no w/r/r. Normal respiratory effort. Abdomen: soft, mid epigastric tenderness, no rigidity.  Skin: hyperpigmentation LE.  Musculoskeletal: grossly normal tone BUE/BLE Psychiatric: grossly normal mood and affect, speech fluent and appropriate Neurologic: grossly non-focal.          Labs on Admission:  Basic Metabolic Panel:  Recent Labs Lab 01/05/14 1350  NA 140  K 4.1  CL 101  CO2 24  GLUCOSE 158*  BUN 13  CREATININE 0.94  CALCIUM 9.6   Liver Function Tests:  Recent Labs Lab 01/05/14 1350  AST 209*  ALT  125*  ALKPHOS 105  BILITOT 2.4*  PROT 8.4*  ALBUMIN 3.9    Recent Labs Lab 01/05/14 1350  LIPASE >3000*   No results found for this basename: AMMONIA,  in the last 168 hours CBC:  Recent Labs Lab 01/05/14 1350  WBC 11.1*  HGB 15.5  HCT 47.0  MCV 87.4  PLT 180   Cardiac Enzymes: No results found for this basename: CKTOTAL, CKMB, CKMBINDEX, TROPONINI,  in the last 168 hours  BNP (last 3 results)  Recent Labs  01/05/14 1350  PROBNP 209.9*   CBG: No results found for this basename: GLUCAP,  in the last 168 hours  Radiological Exams on Admission: [place] Abdomen Complete  01/05/2014   CLINICAL DATA:  *Abdominal  pain and elevated LFTs  EXAM: ULTRASOUND ABDOMEN COMPLETE  COMPARISON:  05/24/2010.  FINDINGS: Gallbladder:  No gallstones or wall thickening visualized. No sonographic Murphy sign noted.  Common bile duct:  Diameter: 6 mm.  Liver:  Increase in echogenicity consistent with fatty infiltration. This is similar to that seen on the prior exam.  IVC:  Not well visualized.  Pancreas:  Not well visualized.  The visualized tail is within normal limits.  Spleen:  Size and appearance within normal limits.  Right Kidney:  Length: 13 cm in length. Echogenicity within normal limits. No mass or hydronephrosis visualized.  Left Kidney:  Length: 13.1 cm in length. Echogenicity within normal limits. No mass or hydronephrosis visualized.  Abdominal aorta:  No aneurysm visualized.  Other findings:  None.  IMPRESSION: Limited exam due to patient body habitus and overlying bowel gas. No acute abnormality is seen.   Electronically Signed   By: Alcide CleverMark  Lukens M.D.   On: 01/05/2014 16:26    EKG: Independently reviewed. Sinus rhythm, borderline Prolong QT.   Assessment/Plan Active Problems:   OBESITY, NOS   OSA (obstructive sleep apnea)   Pancreatitis   Abnormal transaminases  1-Acute Pancreatitis: Patient presents with abdominal pain, nausea. Lipase at 3000, transaminases, increase bilirubin. US limited due to body habitus no obvious cholelithiasis. Will need to repeat LFT and lipase in am. He might need MRCP to further evaluate. [facility] will see patient in consultation in am.  _IV fluids, NPO, IV pain medications.   2-Dyspnea; could be related to pain. Will check Chest x ray, cycle cardia enzymes. BNP no significant elevated at 209. Incentive spirometry.   3-DVT prophylaxis; Lovenox.   4-Hypertension; he is not on medications at home. If BP continue to be elevated he will probably need to be started on schedule medications. Monitor BP. Will order PRN Hydralazine.   5- OSA: on CPAP.   6-Depression; hold Prozac until  able to take oral.   Code Status: Full code.  Family Communication: Care discussed with Patient.  Disposition Plan: expect 3 to 4 days inpatient.   Time spent: 65 minutes.   [name], [place] A [facility] Pager [phone]  **Disclaimer: This note may have been dictated with voice recognition software. Similar sounding words can inadvertently be transcribed and this note may contain transcription errors which may not have been corrected upon publication of note.**

## 2014-01-05 NOTE — ED Provider Notes (Signed)
CSN: [id number]     Arrival date & time 01/05/14  1244 History   First MD Initiated Contact with Patient 01/05/14 1317     Chief Complaint  Patient presents with  . Nausea  . Shortness of Breath     (Consider location/radiation/quality/duration/timing/severity/associated sxs/prior Treatment) HPI Comments: Patient presents to ER for evaluation of abdominal discomfort and shortness of breath. Patient reports that he awakened this morning at 6 AM with pain in his mid abdomen area. The pain has progressively moved upwards to the epigastric region and now he is feeling like he is short of breath. He has had persistent nausea with the symptoms, has tried to make himself vomit but has only been able to dry heave. He has had 3 bowel movements the last one was "loose", but no overt diarrhea. Patient not experiencing any pain up in the chest region.  Patient is a 68 y.o. male presenting with shortness of breath.  Shortness of Breath Associated symptoms: abdominal pain     Past Medical History  Diagnosis Date  . DVT (deep venous thrombosis)     L leg in 2009  . Hypertension     enlarged heart  . OSA (obstructive sleep apnea)     on CPAP since 1995   Past Surgical History  Procedure Laterality Date  . Back surgery  04/09/1999  . Knee surgery  1998  . Left inguinal hernia    . Lumbar laminectomy    . Cervical laminectomy and fusion    . Trigger finger release  08/04/2012    Procedure: MINOR RELEASE TRIGGER FINGER/A-1 PULLEY;  Surgeon: Nicki Reaper, MD;  [facility];  Service: Orthopedics;  Laterality: Right;   Family History  Problem Relation Age of Onset  . Breast cancer Mother    History  Substance Use Topics  . Smoking status: Never Smoker   . Smokeless tobacco: Not on file  . Alcohol Use: No    Review of Systems  Respiratory: Positive for shortness of breath.   Gastrointestinal: Positive for nausea and abdominal pain.  All other systems reviewed and are  negative.     Allergies  Penicillins  Home Medications   Prior to Admission medications   Medication Sig Start Date End Date Taking? Authorizing Provider  aspirin 81 MG tablet Take 81 mg by mouth daily.   Yes Historical Provider, MD  cetirizine (ZYRTEC) 10 MG tablet Take 10 mg by mouth daily.   Yes Historical Provider, MD  FLUoxetine (PROZAC) 20 MG tablet Take 20 mg by mouth daily.   Yes Historical Provider, MD  ibuprofen (ADVIL,MOTRIN) 200 MG tablet Take 400 mg by mouth every 6 (six) hours as needed (pain).   Yes Historical Provider, MD  mupirocin ointment (BACTROBAN) 2 % Place 1 application into the nose 3 (three) times daily.   Yes Historical Provider, MD   BP 184/106  Pulse 82  Temp(Src) 98.7 F (37.1 C) (Oral)  Resp 23  SpO2 97% Physical Exam  Constitutional: He is oriented to person, place, and time. He appears well-developed and well-nourished. No distress.  HENT:  Head: Normocephalic and atraumatic.  Right Ear: Hearing normal.  Left Ear: Hearing normal.  Nose: Nose normal.  Mouth/Throat: Oropharynx is clear and moist and mucous membranes are normal.  Eyes: Conjunctivae and EOM are normal. Pupils are equal, round, and reactive to light.  Neck: Normal range of motion. Neck supple.  Cardiovascular: Regular rhythm, S1 normal and S2 normal.  Exam reveals no  gallop and no friction rub.   No murmur heard. Pulmonary/Chest: Effort normal and breath sounds normal. No respiratory distress. He exhibits no tenderness.  Abdominal: Soft. Normal appearance and bowel sounds are normal. There is no hepatosplenomegaly. There is tenderness in the epigastric area and periumbilical area. There is no rebound, no guarding, no tenderness at McBurney's point and negative Murphy's sign. No hernia.  Musculoskeletal: Normal range of motion.  Neurological: He is alert and oriented to person, place, and time. He has normal strength. No cranial nerve deficit or sensory deficit. Coordination normal.  GCS eye subscore is 4. GCS verbal subscore is 5. GCS motor subscore is 6.  Skin: Skin is warm, dry and intact. No rash noted. No cyanosis.  Psychiatric: He has a normal mood and affect. His speech is normal and behavior is normal. Thought content normal.    ED Course  Procedures (including critical care time) Labs Review Labs Reviewed  CBC - Abnormal; Notable for the following:    WBC 11.1 (*)    All other components within normal limits  PRO B NATRIURETIC PEPTIDE - Abnormal; Notable for the following:    Pro B Natriuretic peptide (BNP) 209.9 (*)    All other components within normal limits  COMPREHENSIVE METABOLIC PANEL - Abnormal; Notable for the following:    Glucose, Bld 158 (*)    Total Protein 8.4 (*)    AST 209 (*)    ALT 125 (*)    Total Bilirubin 2.4 (*)    GFR calc non Af Amer 85 (*)    All other components within normal limits  LIPASE, BLOOD - Abnormal; Notable for the following:    Lipase >3000 (*)    All other components within normal limits  I-STAT TROPOININ, ED    Imaging Review [place] Abdomen Complete  01/05/2014   CLINICAL DATA:  *Abdominal pain and elevated LFTs  EXAM: ULTRASOUND ABDOMEN COMPLETE  COMPARISON:  05/24/2010.  FINDINGS: Gallbladder:  No gallstones or wall thickening visualized. No sonographic Murphy sign noted.  Common bile duct:  Diameter: 6 mm.  Liver:  Increase in echogenicity consistent with fatty infiltration. This is similar to that seen on the prior exam.  IVC:  Not well visualized.  Pancreas:  Not well visualized.  The visualized tail is within normal limits.  Spleen:  Size and appearance within normal limits.  Right Kidney:  Length: 13 cm in length. Echogenicity within normal limits. No mass or hydronephrosis visualized.  Left Kidney:  Length: 13.1 cm in length. Echogenicity within normal limits. No mass or hydronephrosis visualized.  Abdominal aorta:  No aneurysm visualized.  Other findings:  None.  IMPRESSION: Limited exam due to patient body  habitus and overlying bowel gas. No acute abnormality is seen.   Electronically Signed   By: Alcide CleverMark  Lukens M.D.   On: 01/05/2014 16:26     EKG Interpretation   Date/Time:  Thursday January 05 2014 12:59:32 EDT Ventricular Rate:  77 PR Interval:  73 QRS Duration: 104 QT Interval:  423 QTC Calculation: 479 R Axis:   0 Text Interpretation:  Sinus rhythm Atrial premature complexes Short PR  interval Borderline T abnormalities, lateral leads Borderline prolonged QT  interval No significant change since last tracing Confirmed by POLLINA   MD, CHRISTOPHER [phone] on 01/05/2014 1:18:26 PM      MDM   Final diagnoses:  Pancreatitis    Patient presents to the ER with abdominal pain and nausea. Pain began in the mid lower abdomen, now in  the epigastric region. He has diffuse tenderness. Examination is limited by his large body habitus, however. He did have some radiation up into the chest and felt short of breath. This was likely radiation of the pain from the abdomen, however. Cardiac workup is unremarkable. Evaluation does reveal a markedly elevated lipase to greater than 3000. Patient denies alcohol use. Etiology of the pancreatitis is unclear at this time. Did have mildly elevated LFTs. Ultrasound performed to further evaluate for possible gallstone causing the symptoms. It was a limited exam because of his body habitus, but no acute findings were seen.  Case discussed with Doctor Regalado. Specifically, we discussed whether or not the patient would need additional imaging such as a CT scan. At this point it was decided that the patient will be treated with bowel rest, analgesia, IV fluids and follow his improvement. Imaging can be performed at a later date if not improving.   Gilda Crease, MD 01/05/14 [phone]

## 2014-01-05 NOTE — Progress Notes (Signed)
RT setup Auto CPAP 7-20 CMH20 for Pt with nasal mask. Pt stated that he would self administer when ready.  RT to monitor and assess as needed.

## 2014-01-05 NOTE — ED Notes (Signed)
Hospitalist at bedside 

## 2014-01-06 LAB — TROPONIN I: Troponin I: <0.3 ng/mL (ref <0.30)

## 2014-01-06 LAB — COMPREHENSIVE METABOLIC PANEL
ALT: 181 U/L — AB (ref 0–53)
AST: 185 U/L — ABNORMAL HIGH (ref 0–37)
Albumin: 3.3 g/dL — ABNORMAL LOW (ref 3.5–5.2)
Alkaline Phosphatase: 92 U/L (ref 39–117)
BUN: 12 mg/dL (ref 6–23)
CALCIUM: 8.9 mg/dL (ref 8.4–10.5)
CO2: 25 mEq/L (ref 19–32)
Chloride: 104 mEq/L (ref 96–112)
Creatinine, Ser: 1.03 mg/dL (ref 0.50–1.35)
GFR calc non Af Amer: 73 mL/min — ABNORMAL LOW (ref >90)
GFR, EST AFRICAN AMERICAN: 85 mL/min — AB (ref >90)
Glucose, Bld: 114 mg/dL — ABNORMAL HIGH (ref 70–99)
Potassium: 3.6 mEq/L — ABNORMAL LOW (ref 3.7–5.3)
SODIUM: 142 meq/L (ref 137–147)
TOTAL PROTEIN: 7.3 g/dL (ref 6.0–8.3)
Total Bilirubin: 4.8 mg/dL — ABNORMAL HIGH (ref 0.3–1.2)

## 2014-01-06 LAB — CBC
HEMATOCRIT: 43.8 % (ref 39.0–52.0)
HEMOGLOBIN: 14 g/dL (ref 13.0–17.0)
MCH: 28.4 pg (ref 26.0–34.0)
MCHC: 32 g/dL (ref 30.0–36.0)
MCV: 88.8 fL (ref 78.0–100.0)
Platelets: 178 10*3/uL (ref 150–400)
RBC: 4.93 MIL/uL (ref 4.22–5.81)
RDW: 13.6 % (ref 11.5–15.5)
WBC: 10 10*3/uL (ref 4.0–10.5)

## 2014-01-06 LAB — LIPASE, BLOOD: LIPASE: 289 U/L — AB (ref 11–59)

## 2014-01-06 MED ORDER — KCL IN DEXTROSE-NACL 20-5-0.45 MEQ/L-%-% IV SOLN
INTRAVENOUS | Status: DC
  Administered 2014-01-06 – 2014-01-09 (×7): via INTRAVENOUS
  Administered 2014-01-10: 1000 mL via INTRAVENOUS
  Administered 2014-01-10: via INTRAVENOUS
  Filled 2014-01-06 (×11): qty 1000

## 2014-01-06 MED ORDER — PROMETHAZINE HCL 25 MG/ML IJ SOLN
12.5000 mg | Freq: Four times a day (QID) | INTRAMUSCULAR | Status: AC | PRN
  Administered 2014-01-06 – 2014-01-07 (×3): 12.5 mg via INTRAVENOUS
  Filled 2014-01-06 (×3): qty 1

## 2014-01-06 NOTE — Progress Notes (Signed)
Pt already on CPAP when RT arrived to room.  Pt tolerating well at this time, current settings are 7-20 CMH20.  RT to monitor and assess as needed.

## 2014-01-06 NOTE — Progress Notes (Signed)
[facility] PROGRESS NOTE  Myrtie HawkLarry Hofbauer ZOX:[id number] DOB: Sep 15, 1945 DOA: 01/05/2014 PCP: Cala BradfordWHITE,[name], MD  Assessment/Plan: Pancreatitis - Triglyceride level within normal limits -Lipase trending down more than 3000 initially and currently at 289 - Ultrasound of abdomen reports limited exam due to patient body habitus and overlying bowel gas with reports of no acute abnormalities. Does report no gallstones or wall thickening and no sonographic Murphy sign noted. - Consulted GI for consideration of further evaluation from their standpoint. - Will place on MIVF's while patient is npo    Abnormal transaminases - Defer further evaluation and recommendations to GI  Active Problems:   OSA (obstructive sleep apnea)   - stable pt on CPAP  Code Status: full Family Communication: no family at bedside. Disposition Plan: Pending further improvement in condition and further workup and evaluation by consultants   Consultants:  Gastroenterology  Procedures:  Right upper quadrant ultrasound  Antibiotics:  None  HPI/Subjective: Patient states he feels better today  Objective: Filed Vitals:   01/06/14 1324  BP: 164/96  Pulse: 71  Temp: 98.6 F (37 C)  Resp: 18    Intake/Output Summary (Last 24 hours) at 01/06/14 1530 Last data filed at 01/06/14 1400  Gross per 24 hour  Intake   2110 ml  Output   2570 ml  Net   -460 ml   Filed Weights   01/05/14 1751  Weight: 200.49 kg (442 lb)    Exam:   General:  Pt in NAD, alert and awake  Cardiovascular: RRR, no MRG  Respiratory: CTA BL, no wheezes  Abdomen: + epigastric discomfort, obese, ND  Musculoskeletal: no cyanosis or clubbing   Data Reviewed: Basic Metabolic Panel:  Recent Labs Lab 01/05/14 1350 01/06/14 0420  NA 140 142  K 4.1 3.6*  CL 101 104  CO2 24 25  GLUCOSE 158* 114*  BUN 13 12  CREATININE 0.94 1.03  CALCIUM 9.6 8.9   Liver Function Tests:  Recent Labs Lab 01/05/14 1350  01/06/14 0420  AST 209* 185*  ALT 125* 181*  ALKPHOS 105 92  BILITOT 2.4* 4.8*  PROT 8.4* 7.3  ALBUMIN 3.9 3.3*    Recent Labs Lab 01/05/14 1350 01/06/14 0420  LIPASE >3000* 289*   No results found for this basename: AMMONIA,  in the last 168 hours CBC:  Recent Labs Lab 01/05/14 1350 01/06/14 0420  WBC 11.1* 10.0  HGB 15.5 14.0  HCT 47.0 43.8  MCV 87.4 88.8  PLT 180 178   Cardiac Enzymes:  Recent Labs Lab 01/05/14 1743 01/06/14 0015 01/06/14 0420  TROPONINI <0.30 <0.30 <0.30   BNP (last 3 results)  Recent Labs  01/05/14 1350  PROBNP 209.9*   CBG: No results found for this basename: GLUCAP,  in the last 168 hours  No results found for this or any previous visit (from the past 240 hour(s)).   Studies: Dg Chest 2 View  01/05/2014   CLINICAL DATA:  Short of breath.  Right-sided chest pain.  EXAM: CHEST  2 VIEW  COMPARISON:  09/12/2013.  FINDINGS: Cardiopericardial silhouette within normal limits. Mediastinal contours normal. Trachea midline. No airspace disease or effusion.  IMPRESSION: No active cardiopulmonary disease.   Electronically Signed   By: Andreas NewportGeoffrey  Lamke M.D.   On: 01/05/2014 17:58   [place] Abdomen Complete  01/05/2014   CLINICAL DATA:  *Abdominal pain and elevated LFTs  EXAM: ULTRASOUND ABDOMEN COMPLETE  COMPARISON:  05/24/2010.  FINDINGS: Gallbladder:  No gallstones or wall thickening visualized. No sonographic Eulah PontMurphy  sign noted.  Common bile duct:  Diameter: 6 mm.  Liver:  Increase in echogenicity consistent with fatty infiltration. This is similar to that seen on the prior exam.  IVC:  Not well visualized.  Pancreas:  Not well visualized.  The visualized tail is within normal limits.  Spleen:  Size and appearance within normal limits.  Right Kidney:  Length: 13 cm in length. Echogenicity within normal limits. No mass or hydronephrosis visualized.  Left Kidney:  Length: 13.1 cm in length. Echogenicity within normal limits. No mass or hydronephrosis  visualized.  Abdominal aorta:  No aneurysm visualized.  Other findings:  None.  IMPRESSION: Limited exam due to patient body habitus and overlying bowel gas. No acute abnormality is seen.   Electronically Signed   By: Alcide CleverMark  Lukens M.D.   On: 01/05/2014 16:26    Scheduled Meds: . enoxaparin (LOVENOX) injection  40 mg Subcutaneous Q24H  . mupirocin ointment  1 application Nasal TID  . pantoprazole (PROTONIX) IV  40 mg Intravenous Q12H   Continuous Infusions: . sodium chloride 125 mL/hr at 01/06/14 1004     Time spent: > 35 minutes    [name], Cezar Misiaszek  [facility] Pager [phone]. If 7PM-7AM, please contact night-coverage at [email or web address], password [email or web address] 01/06/2014, 3:30 PM  LOS: 1 day

## 2014-01-06 NOTE — Consult Note (Signed)
Subjective:   HPI  The patient is a Richard Sandoval year old male who was admitted to the hospital yesterday with abdominal pain. He was found to have an elevated lipase greater than 3000 and was admitted with the diagnosis of acute pancreatitis. Today the lipase has decreased to 289. Liver enzymes were elevated on admission with total bilirubin 2.4, AST 209, ALT 125. Today total bilirubin is 4.8, AST 185, ALT 181. Abdominal ultrasound did not show gallstones. There is evidence of fatty liver.triglycerides normal. The patient denies drinking alcohol.  Review of Systems No chest pain or shortness of breath at this time.  Past Medical History  Diagnosis Date  . DVT (deep venous thrombosis)     L leg in 2009  . Hypertension     enlarged heart  . OSA (obstructive sleep apnea)     on CPAP since 1995   Past Surgical History  Procedure Laterality Date  . Back surgery  04/09/1999  . Knee surgery  1998  . Left inguinal hernia    . Lumbar laminectomy    . Cervical laminectomy and fusion    . Trigger finger release  08/04/2012    Procedure: MINOR RELEASE TRIGGER FINGER/A-1 PULLEY;  Surgeon: Nicki ReaperGary R Kuzma, MD;  Location: [facility];  Service: Orthopedics;  Laterality: Right;   History   Social History  . Marital Status: Married    Spouse Name: Evette    Number of Children: N/A  . Years of Education: N/A   Occupational History  . on disability.     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 Smoking status: Never Smoker   . Smokeless tobacco: Never Used  . Alcohol Use: No  . Drug Use: No  . Sexual Activity: Yes   Other Topics Concern  . Not on file   Social History Narrative  . No narrative on file   family history includes Breast cancer in his mother. Current facility-administered medications:0.9 %  sodium chloride infusion, , Intravenous, Continuous, Belkys A Regalado, MD, Last Rate: 125 mL/hr at 01/06/14 1004;   enoxaparin (LOVENOX) injection 40 mg, 40 mg, Subcutaneous, Q24H, Belkys A Regalado, MD, 40 mg at 01/05/14 2010;  hydrALAZINE (APRESOLINE) injection 10 mg, 10 mg, Intravenous, Q8H PRN, Belkys A Regalado, MD HYDROcodone-acetaminophen (NORCO/VICODIN) 5-325 MG per tablet 1-2 tablet, 1-2 tablet, Oral, Q4H PRN, Belkys A Regalado, MD;  morphine 2 MG/ML injection 1 mg, 1 mg, Intravenous, Q3H PRN, Belkys A Regalado, MD, 1 mg at 01/06/14 0817;  mupirocin ointment (BACTROBAN) 2 % 1 application, 1 application, Nasal, TID, Belkys A Regalado, MD, 1 application at 01/06/14 0950;  ondansetron (ZOFRAN) injection 4 mg, 4 mg, Intravenous, Q6H PRN, Belkys A Regalado, MD ondansetron (ZOFRAN) tablet 4 mg, 4 mg, Oral, Q6H PRN, Belkys A Regalado, MD;  pantoprazole (PROTONIX) injection 40 mg, 40 mg, Intravenous, Q12H, Belkys A Regalado, MD, 40 mg at 01/06/14 0947 Allergies  Allergen Reactions  . Penicillins Itching     Objective:     BP 164/96  Pulse 71  Temp(Src) 98.6 F (37 C) (Oral)  Resp 18  Ht 5\' 11"  (1.803 m)  Wt 200.49 kg (442 lb)  BMI 61.67 kg/m2  SpO2 96%  He is in no distress  Nonicteric  Heart regular rhythm no murmurs  Lungs clear  Abdomen: Bowel sounds normal, soft, mild tenderness in the epigastrium  Laboratory No components found with this basename: d1  Assessment:     Acute pancreatitis        Plan:     Given the fact that he does not drink alcohol per his history to me and elevated liver enzymes, one must consider the possibility of his having passed a biliary stone. I would recommend treatment of his pancreatitis medically as currently is being done. I think that an MRCP would be appropriate in the next day or 2 to evaluate the biliary tree for choledocholithiasis.

## 2014-01-07 LAB — COMPREHENSIVE METABOLIC PANEL
ALT: 143 U/L — ABNORMAL HIGH (ref 0–53)
AST: 109 U/L — ABNORMAL HIGH (ref 0–37)
Albumin: 3.3 g/dL — ABNORMAL LOW (ref 3.5–5.2)
Alkaline Phosphatase: 93 U/L (ref 39–117)
BILIRUBIN TOTAL: 2.4 mg/dL — AB (ref 0.3–1.2)
BUN: 11 mg/dL (ref 6–23)
CHLORIDE: 100 meq/L (ref 96–112)
CO2: 26 mEq/L (ref 19–32)
Calcium: 9.2 mg/dL (ref 8.4–10.5)
Creatinine, Ser: 0.98 mg/dL (ref 0.50–1.35)
GFR calc non Af Amer: 83 mL/min — ABNORMAL LOW (ref >90)
GLUCOSE: 116 mg/dL — AB (ref 70–99)
POTASSIUM: 3.8 meq/L (ref 3.7–5.3)
Sodium: 138 mEq/L (ref 137–147)
Total Protein: 7.8 g/dL (ref 6.0–8.3)

## 2014-01-07 LAB — LIPASE, BLOOD: LIPASE: 12 U/L (ref 11–59)

## 2014-01-07 MED ORDER — MORPHINE SULFATE 2 MG/ML IJ SOLN
1.0000 mg | Freq: Once | INTRAMUSCULAR | Status: AC
  Administered 2014-01-07: 1 mg via INTRAVENOUS
  Filled 2014-01-07: qty 1

## 2014-01-07 MED ORDER — MORPHINE SULFATE 2 MG/ML IJ SOLN
1.0000 mg | INTRAMUSCULAR | Status: DC | PRN
  Administered 2014-01-07 – 2014-01-09 (×11): 1 mg via INTRAVENOUS
  Filled 2014-01-07 (×10): qty 1

## 2014-01-07 MED ORDER — ENOXAPARIN SODIUM 100 MG/ML ~~LOC~~ SOLN
100.0000 mg | SUBCUTANEOUS | Status: DC
  Administered 2014-01-07 – 2014-01-10 (×4): 100 mg via SUBCUTANEOUS
  Filled 2014-01-07 (×5): qty 1

## 2014-01-07 NOTE — Progress Notes (Signed)
RT setup auto titrate CPAP min-7 & max-20cmH2O. Sterile water was added for humidification. Pt states he is not ready to be placed on CPAP at this time and does not require any assistance with CPAP or mask application. RT will continue to monitor as needed.

## 2014-01-07 NOTE — Progress Notes (Signed)
Patient ID: Richard Sandoval, male   DOB: 07-28-1946, 68 y.o.   MRN: [id number] [facility]  Richard Sandoval 68 y.o. 07-28-1946   Subjective: Complaining of abdominal pain that feels as bad as prior to admit. Complaining of nausea.  Objective: Vital signs in last 24 hours: Filed Vitals:   01/07/14 0856  BP: 178/89  Pulse: 67  Temp: 99.4  Resp: 20    Physical Exam: Gen: alert, mild acute distress, morbidly obese Abd: diffusely tender with guarding, obese, decreased bowel sounds  Lab Results:  Recent Labs  01/05/14 1350 01/06/14 0420  NA 140 142  K 4.1 3.6*  CL 101 104  CO2 24 25  GLUCOSE 158* 114*  BUN 13 12  CREATININE 0.94 1.03  CALCIUM 9.6 8.9    Recent Labs  01/05/14 1350 01/06/14 0420  AST 209* 185*  ALT 125* 181*  ALKPHOS 105 92  BILITOT 2.4* 4.8*  PROT 8.4* 7.3  ALBUMIN 3.9 3.3*    Recent Labs  01/05/14 1350 01/06/14 0420  WBC 11.1* 10.0  HGB 15.5 14.0  HCT 47.0 43.8  MCV 87.4 88.8  PLT 180 178   No results found for this basename: LABPROT, INR,  in the last 72 hours    Assessment/Plan: Acute pancreatitis concerning for a biliary source. Patient reports metal "titanium" rods in his back so MRCP not an option. Will check CMET and if LFTs rising then may need to consider an ERCP in the near future. Continue IVFs, bowel rest, supportive care. Will follow.   Joas Motton C. 01/07/2014, 9:16 AM

## 2014-01-07 NOTE — Progress Notes (Signed)
[facility] PROGRESS NOTE  Richard Sandoval VHQ:[id number] DOB: 09/12/1945 DOA: 01/05/2014 PCP: Cala BradfordWHITE,[name], MD  Assessment/Plan: Pancreatitis - Triglyceride level within normal limits - Lipase trending down more than 3000 initially and currently at 289 - Ultrasound of abdomen reports limited exam due to patient body habitus and overlying bowel gas with reports of no acute abnormalities. Does report no gallstones or wall thickening and no sonographic Murphy sign noted. - Consulted GI for consideration of further evaluation from their standpoint. Currently considering ERCP based on further lab work. - Will place on MIVF's while patient is npo    Abnormal transaminases - Defer further evaluation and recommendations to GI  Active Problems:   OSA (obstructive sleep apnea)   - stable pt on CPAP  Code Status: full Family Communication: no family at bedside. Disposition Plan: Pending further improvement in condition and further workup and evaluation by consultants   Consultants:  Gastroenterology  Procedures:  Right upper quadrant ultrasound  Antibiotics:  None  HPI/Subjective: Patient states he feels about the same. No acute issues reported overnight.  Objective: Filed Vitals:   01/07/14 1330  BP: 162/92  Pulse: 71  Temp:   Resp:     Intake/Output Summary (Last 24 hours) at 01/07/14 1453 Last data filed at 01/07/14 1330  Gross per 24 hour  Intake 996.66 ml  Output   2865 ml  Net -1868.34 ml   Filed Weights   01/05/14 1751  Weight: 200.49 kg (442 lb)    Exam:   General:  Pt in NAD, alert and awake  Cardiovascular: RRR, no MRG  Respiratory: CTA BL, no wheezes  Abdomen: + epigastric discomfort, obese, ND  Musculoskeletal: no cyanosis or clubbing   Data Reviewed: Basic Metabolic Panel:  Recent Labs Lab 01/05/14 1350 01/06/14 0420 01/07/14 1035  NA 140 142 138  K 4.1 3.6* 3.8  CL 101 104 100  CO2 24 25 26   GLUCOSE 158* 114* 116*  BUN 13  12 11   CREATININE 0.94 1.03 0.98  CALCIUM 9.6 8.9 9.2   Liver Function Tests:  Recent Labs Lab 01/05/14 1350 01/06/14 0420 01/07/14 1035  AST 209* 185* 109*  ALT 125* 181* 143*  ALKPHOS 105 92 93  BILITOT 2.4* 4.8* 2.4*  PROT 8.4* 7.3 7.8  ALBUMIN 3.9 3.3* 3.3*    Recent Labs Lab 01/05/14 1350 01/06/14 0420 01/07/14 1035  LIPASE >3000* 289* 12   No results found for this basename: AMMONIA,  in the last 168 hours CBC:  Recent Labs Lab 01/05/14 1350 01/06/14 0420  WBC 11.1* 10.0  HGB 15.5 14.0  HCT 47.0 43.8  MCV 87.4 88.8  PLT 180 178   Cardiac Enzymes:  Recent Labs Lab 01/05/14 1743 01/06/14 0015 01/06/14 0420  TROPONINI <0.30 <0.30 <0.30   BNP (last 3 results)  Recent Labs  01/05/14 1350  PROBNP 209.9*   CBG: No results found for this basename: GLUCAP,  in the last 168 hours  No results found for this or any previous visit (from the past 240 hour(s)).   Studies: Dg Chest 2 View  01/05/2014   CLINICAL DATA:  Short of breath.  Right-sided chest pain.  EXAM: CHEST  2 VIEW  COMPARISON:  09/12/2013.  FINDINGS: Cardiopericardial silhouette within normal limits. Mediastinal contours normal. Trachea midline. No airspace disease or effusion.  IMPRESSION: No active cardiopulmonary disease.   Electronically Signed   By: Andreas NewportGeoffrey  Lamke M.D.   On: 01/05/2014 17:58   [place] Abdomen Complete  01/05/2014   CLINICAL  DATA:  *Abdominal pain and elevated LFTs  EXAM: ULTRASOUND ABDOMEN COMPLETE  COMPARISON:  05/24/2010.  FINDINGS: Gallbladder:  No gallstones or wall thickening visualized. No sonographic Murphy sign noted.  Common bile duct:  Diameter: 6 mm.  Liver:  Increase in echogenicity consistent with fatty infiltration. This is similar to that seen on the prior exam.  IVC:  Not well visualized.  Pancreas:  Not well visualized.  The visualized tail is within normal limits.  Spleen:  Size and appearance within normal limits.  Right Kidney:  Length: 13 cm in length.  Echogenicity within normal limits. No mass or hydronephrosis visualized.  Left Kidney:  Length: 13.1 cm in length. Echogenicity within normal limits. No mass or hydronephrosis visualized.  Abdominal aorta:  No aneurysm visualized.  Other findings:  None.  IMPRESSION: Limited exam due to patient body habitus and overlying bowel gas. No acute abnormality is seen.   Electronically Signed   By: Alcide CleverMark  Lukens M.D.   On: 01/05/2014 16:26    Scheduled Meds: . enoxaparin (LOVENOX) injection  100 mg Subcutaneous Q24H  . mupirocin ointment  1 application Nasal TID  . pantoprazole (PROTONIX) IV  40 mg Intravenous Q12H   Continuous Infusions: . dextrose 5 % and 0.45 % NaCl with KCl 20 mEq/L 100 mL/hr at 01/07/14 1333     Time spent: > 35 minutes    [name], Lidiya Reise  [facility] Pager [phone]. If 7PM-7AM, please contact night-coverage at [email or web address], password [email or web address] 01/07/2014, 2:53 PM  LOS: 2 days

## 2014-01-08 ENCOUNTER — Encounter (HOSPITAL_COMMUNITY): Payer: Self-pay | Admitting: Radiology

## 2014-01-08 ENCOUNTER — Inpatient Hospital Stay (HOSPITAL_COMMUNITY): Payer: Managed Care, Other (non HMO)

## 2014-01-08 LAB — COMPREHENSIVE METABOLIC PANEL
ALBUMIN: 3.4 g/dL — AB (ref 3.5–5.2)
ALT: 117 U/L — ABNORMAL HIGH (ref 0–53)
AST: 82 U/L — AB (ref 0–37)
Alkaline Phosphatase: 87 U/L (ref 39–117)
BUN: 9 mg/dL (ref 6–23)
CO2: 27 mEq/L (ref 19–32)
CREATININE: 0.9 mg/dL (ref 0.50–1.35)
Calcium: 9.1 mg/dL (ref 8.4–10.5)
Chloride: 99 mEq/L (ref 96–112)
GFR calc Af Amer: >90 mL/min (ref >90)
GFR, EST NON AFRICAN AMERICAN: 86 mL/min — AB (ref >90)
Glucose, Bld: 113 mg/dL — ABNORMAL HIGH (ref 70–99)
Potassium: 4.4 mEq/L (ref 3.7–5.3)
Sodium: 137 mEq/L (ref 137–147)
Total Bilirubin: 1.9 mg/dL — ABNORMAL HIGH (ref 0.3–1.2)
Total Protein: 8.2 g/dL (ref 6.0–8.3)

## 2014-01-08 LAB — CBC WITH DIFFERENTIAL/PLATELET
Basophils Absolute: 0 10*3/uL (ref 0.0–0.1)
Basophils Relative: 0 % (ref 0–1)
EOS ABS: 0.1 10*3/uL (ref 0.0–0.7)
Eosinophils Relative: 1 % (ref 0–5)
HCT: 44.5 % (ref 39.0–52.0)
Hemoglobin: 15.1 g/dL (ref 13.0–17.0)
LYMPHS ABS: 1.8 10*3/uL (ref 0.7–4.0)
Lymphocytes Relative: 20 % (ref 12–46)
MCH: 30.1 pg (ref 26.0–34.0)
MCHC: 33.9 g/dL (ref 30.0–36.0)
MCV: 88.6 fL (ref 78.0–100.0)
MONOS PCT: 9 % (ref 3–12)
Monocytes Absolute: 0.8 10*3/uL (ref 0.1–1.0)
NEUTROS PCT: 70 % (ref 43–77)
Neutro Abs: 6.2 10*3/uL (ref 1.7–7.7)
Platelets: 183 10*3/uL (ref 150–400)
RBC: 5.02 MIL/uL (ref 4.22–5.81)
RDW: 13.6 % (ref 11.5–15.5)
WBC: 9 10*3/uL (ref 4.0–10.5)

## 2014-01-08 MED ORDER — IOHEXOL 300 MG/ML  SOLN
50.0000 mL | Freq: Once | INTRAMUSCULAR | Status: AC | PRN
  Administered 2014-01-08: 50 mL via ORAL

## 2014-01-08 MED ORDER — IOHEXOL 300 MG/ML  SOLN
125.0000 mL | Freq: Once | INTRAMUSCULAR | Status: AC | PRN
  Administered 2014-01-08: 125 mL via INTRAVENOUS

## 2014-01-08 NOTE — Progress Notes (Signed)
RT setup Auto CPAP 7-20 CMH20 for pt with nasal mask. Pt requested that no sterile water be added due to water leaking out the previous night. Pt stated that he would self administer when ready. RT to monitor as needed

## 2014-01-08 NOTE — Progress Notes (Signed)
Patient ID: Richard Sandoval, male   DOB: 1945/10/13, 68 y.o.   MRN: [id number] [facility]  Richard HawkLarry Sandoval 68 y.o. 1945/10/13   Subjective: Feels better compared to yesterday. Abdominal pain not as bad and feels like a burning pain when it happens. Nausea without vomiting. Resting but easily arousable.  Objective: Vital signs in last 24 hours: Filed Vitals:   01/08/14 0953  BP: 198/97  Pulse: 76  Temp: 98.2  Resp: 20    Physical Exam: Gen: no acute distress, morbidly obese, pleasant Abd: epigastric tenderness with guarding, soft, obese, decreased bowel sounds  Lab Results:  Recent Labs  01/06/14 0420 01/07/14 1035  NA 142 138  K 3.6* 3.8  CL 104 100  CO2 25 26  GLUCOSE 114* 116*  BUN 12 11  CREATININE 1.03 0.98  CALCIUM 8.9 9.2    Recent Labs  01/06/14 0420 01/07/14 1035  AST 185* 109*  ALT 181* 143*  ALKPHOS 92 93  BILITOT 4.8* 2.4*  PROT 7.3 7.8  ALBUMIN 3.3* 3.3*    Recent Labs  01/05/14 1350 01/06/14 0420  WBC 11.1* 10.0  HGB 15.5 14.0  HCT 47.0 43.8  MCV 87.4 88.8  PLT 180 178   No results found for this basename: LABPROT, INR,  in the last 72 hours    Assessment/Plan: Acute Pancreatitis with improving liver enzymes and lipase has normalized. May have passed a stone. Recheck labs. Will do an abd CT with contrast to further evaluate biliary tree. MRCP not an option due to report of metal rods in back. Continue ice chips without advancing. Supportive care. Will follow.   Richard Sandoval C. 01/08/2014, 11:13 AM

## 2014-01-08 NOTE — Progress Notes (Signed)
[facility] PROGRESS NOTE  Richard Sandoval:[id number] DOB: 1945/09/15 DOA: 01/05/2014 PCP: Cala BradfordWHITE,Richard S, MD  Assessment/Plan: Pancreatitis - Triglyceride level within normal limits - Lipase trending down more than 3000 initially and currently at 12 - Ultrasound of abdomen reports limited exam due to patient body habitus and overlying bowel gas with reports of no acute abnormalities. Does report no gallstones or wall thickening and no sonographic Murphy sign noted. - Consulted GI for consideration of further evaluation from their standpoint. Plan is to obtain abdominal CT scan - Continue on MIVF's while patient is npo    Abnormal transaminases - Defer further evaluation and recommendations to GI  Active Problems:   OSA (obstructive sleep apnea)   - stable pt on CPAP  Code Status: full Family Communication: no family at bedside. Disposition Plan: Pending further improvement in condition and further workup and evaluation by consultants   Consultants:  Gastroenterology  Procedures:  Right upper quadrant ultrasound  Antibiotics:  None  HPI/Subjective: Patient states he feels about the same. No acute issues reported overnight.  Objective: Filed Vitals:   01/08/14 1429  BP: 208/112  Pulse: 70  Temp: 98 F (36.7 C)  Resp: 18    Intake/Output Summary (Last 24 hours) at 01/08/14 1519 Last data filed at 01/08/14 1430  Gross per 24 hour  Intake   3600 ml  Output   2750 ml  Net    850 ml   Filed Weights   01/05/14 1751 01/08/14 1145  Weight: 200.49 kg (442 lb) 194.276 kg (428 lb 4.8 oz)    Exam:   General:  Pt in NAD, alert and awake  Cardiovascular: none cyanotic extremities  Respiratory: no wheezes, no increased work of breathing  Abdomen: obese, ND  Musculoskeletal: no cyanosis or clubbing   Data Reviewed: Basic Metabolic Panel:  Recent Labs Lab 01/05/14 1350 01/06/14 0420 01/07/14 1035 01/08/14 1209  NA 140 142 138 137  K 4.1 3.6*  3.8 4.4  CL 101 104 100 99  CO2 24 25 26 27   GLUCOSE 158* 114* 116* 113*  BUN 13 12 11 9   CREATININE 0.94 1.03 0.98 0.90  CALCIUM 9.6 8.9 9.2 9.1   Liver Function Tests:  Recent Labs Lab 01/05/14 1350 01/06/14 0420 01/07/14 1035 01/08/14 1209  AST 209* 185* 109* 82*  ALT 125* 181* 143* 117*  ALKPHOS 105 92 93 87  BILITOT 2.4* 4.8* 2.4* 1.9*  PROT 8.4* 7.3 7.8 8.2  ALBUMIN 3.9 3.3* 3.3* 3.4*    Recent Labs Lab 01/05/14 1350 01/06/14 0420 01/07/14 1035  LIPASE >3000* 289* 12   No results found for this basename: AMMONIA,  in the last 168 hours CBC:  Recent Labs Lab 01/05/14 1350 01/06/14 0420 01/08/14 1209  WBC 11.1* 10.0 9.0  NEUTROABS  --   --  6.2  HGB 15.5 14.0 15.1  HCT 47.0 43.8 44.5  MCV 87.4 88.8 88.6  PLT 180 178 183   Cardiac Enzymes:  Recent Labs Lab 01/05/14 1743 01/06/14 0015 01/06/14 0420  TROPONINI <0.30 <0.30 <0.30   BNP (last 3 results)  Recent Labs  01/05/14 1350  PROBNP 209.9*   CBG: No results found for this basename: GLUCAP,  in the last 168 hours  No results found for this or any previous visit (from the past 240 hour(s)).   Studies: Ct Abdomen W Contrast  01/08/2014   CLINICAL DATA:  Evaluate biliary tree and pancreas.  Pancreatitis.  EXAM: CT ABDOMEN WITH CONTRAST  TECHNIQUE: Multidetector CT imaging  of the abdomen was performed using the standard protocol following bolus administration of intravenous contrast.  CONTRAST:  50mL OMNIPAQUE IOHEXOL 300 MG/ML SOLN, 125mL OMNIPAQUE IOHEXOL 300 MG/ML SOLN  COMPARISON:  None.  FINDINGS: No pleural or pericardial effusion identified. The lung bases are clear.  No focal liver abnormality identified. The gallbladder is normal. No biliary dilatation. Mild diffuse edema of the pancreas with mild peripancreatic fat stranding. No evidence for pancreatic necrosis or pseudocyst. No pancreatic duct dilatation. The spleen is normal.  The adrenal glands are normal. Normal appearance of both  kidneys. Normal caliber of the abdominal aorta. Gastrohepatic ligament lymph node measures 1.9 cm. Celiac lymph node measures 1.5 cm, image 21/series 2. No periaortic or aortocaval adenopathy.  There is no free fluid or abnormal fluid collections within the upper abdomen. Normal appearance of the stomach. The small bowel loops op appear normal. Normal appearance of the colon.  Review of the visualized bony structures is significant for previous hardware fixation within the lower lumbar spine.  IMPRESSION: 1. Pancreatitis. 2. No complications identified.   Electronically Signed   By: Signa Kellaylor  Stroud M.D.   On: 01/08/2014 14:28    Scheduled Meds: . enoxaparin (LOVENOX) injection  100 mg Subcutaneous Q24H  . mupirocin ointment  1 application Nasal TID  . pantoprazole (PROTONIX) IV  40 mg Intravenous Q12H   Continuous Infusions: . dextrose 5 % and 0.45 % NaCl with KCl 20 mEq/L 100 mL/hr at 01/08/14 0849     Time spent: > 35 minutes    [name], [name]  [facility] Pager [phone]. If 7PM-7AM, please contact night-coverage at [email or web address], password [email or web address] 01/08/2014, 3:19 PM  LOS: 3 days

## 2014-01-09 NOTE — Progress Notes (Signed)
[facility] PROGRESS NOTE  Myrtie HawkLarry Deiter KVQ:[id number] DOB: 26-Feb-1946 DOA: 01/05/2014 PCP: Cala BradfordWHITE,[name], MD  Assessment/Plan: Pancreatitis - Triglyceride level within normal limits - Lipase trending down more than 3000 initially and on last check 12 - Ultrasound of abdomen reports limited exam due to patient body habitus and overlying bowel gas with reports of no acute abnormalities. Does report no gallstones or wall thickening and no sonographic Murphy sign noted. - Consulted GI - Advance to clear liquid diet.    Abnormal transaminases - Defer further evaluation and recommendations to GI  Active Problems:   OSA (obstructive sleep apnea)   - stable pt on CPAP  Code Status: full Family Communication: no family at bedside. Disposition Plan: Pending further improvement in condition and further workup and evaluation by consultants   Consultants:  Gastroenterology  Procedures:  Right upper quadrant ultrasound  Antibiotics:  None  HPI/Subjective: Patient states he less abdominal pain and currently feels better.  Objective: Filed Vitals:   01/09/14 1324  BP: 189/80  Pulse: 57  Temp: 99 F (37.2 C)  Resp: 18    Intake/Output Summary (Last 24 hours) at 01/09/14 1335 Last data filed at 01/09/14 1000  Gross per 24 hour  Intake   2430 ml  Output   2150 ml  Net    280 ml   Filed Weights   01/05/14 1751 01/08/14 1145  Weight: 200.49 kg (442 lb) 194.276 kg (428 lb 4.8 oz)    Exam:   General:  Pt in NAD, alert and awake  Cardiovascular: none cyanotic extremities  Respiratory: no wheezes, no increased work of breathing  Abdomen: obese, ND  Musculoskeletal: no cyanosis or clubbing   Data Reviewed: Basic Metabolic Panel:  Recent Labs Lab 01/05/14 1350 01/06/14 0420 01/07/14 1035 01/08/14 1209  NA 140 142 138 137  K 4.1 3.6* 3.8 4.4  CL 101 104 100 99  CO2 24 25 26 27   GLUCOSE 158* 114* 116* 113*  BUN 13 12 11 9   CREATININE 0.94 1.03 0.98  0.90  CALCIUM 9.6 8.9 9.2 9.1   Liver Function Tests:  Recent Labs Lab 01/05/14 1350 01/06/14 0420 01/07/14 1035 01/08/14 1209  AST 209* 185* 109* 82*  ALT 125* 181* 143* 117*  ALKPHOS 105 92 93 87  BILITOT 2.4* 4.8* 2.4* 1.9*  PROT 8.4* 7.3 7.8 8.2  ALBUMIN 3.9 3.3* 3.3* 3.4*    Recent Labs Lab 01/05/14 1350 01/06/14 0420 01/07/14 1035  LIPASE >3000* 289* 12   No results found for this basename: AMMONIA,  in the last 168 hours CBC:  Recent Labs Lab 01/05/14 1350 01/06/14 0420 01/08/14 1209  WBC 11.1* 10.0 9.0  NEUTROABS  --   --  6.2  HGB 15.5 14.0 15.1  HCT 47.0 43.8 44.5  MCV 87.4 88.8 88.6  PLT 180 178 183   Cardiac Enzymes:  Recent Labs Lab 01/05/14 1743 01/06/14 0015 01/06/14 0420  TROPONINI <0.30 <0.30 <0.30   BNP (last 3 results)  Recent Labs  01/05/14 1350  PROBNP 209.9*   CBG: No results found for this basename: GLUCAP,  in the last 168 hours  No results found for this or any previous visit (from the past 240 hour(s)).   Studies: Ct Abdomen W Contrast  01/08/2014   CLINICAL DATA:  Evaluate biliary tree and pancreas.  Pancreatitis.  EXAM: CT ABDOMEN WITH CONTRAST  TECHNIQUE: Multidetector CT imaging of the abdomen was performed using the standard protocol following bolus administration of intravenous contrast.  CONTRAST:  50mL OMNIPAQUE IOHEXOL 300 MG/ML SOLN, 125mL OMNIPAQUE IOHEXOL 300 MG/ML SOLN  COMPARISON:  None.  FINDINGS: No pleural or pericardial effusion identified. The lung bases are clear.  No focal liver abnormality identified. The gallbladder is normal. No biliary dilatation. Mild diffuse edema of the pancreas with mild peripancreatic fat stranding. No evidence for pancreatic necrosis or pseudocyst. No pancreatic duct dilatation. The spleen is normal.  The adrenal glands are normal. Normal appearance of both kidneys. Normal caliber of the abdominal aorta. Gastrohepatic ligament lymph node measures 1.9 cm. Celiac lymph node measures  1.5 cm, image 21/series 2. No periaortic or aortocaval adenopathy.  There is no free fluid or abnormal fluid collections within the upper abdomen. Normal appearance of the stomach. The small bowel loops op appear normal. Normal appearance of the colon.  Review of the visualized bony structures is significant for previous hardware fixation within the lower lumbar spine.  IMPRESSION: 1. Pancreatitis. 2. No complications identified.   Electronically Signed   By: Signa Kellaylor  Stroud M.D.   On: 01/08/2014 14:28    Scheduled Meds: . enoxaparin (LOVENOX) injection  100 mg Subcutaneous Q24H  . mupirocin ointment  1 application Nasal TID  . pantoprazole (PROTONIX) IV  40 mg Intravenous Q12H   Continuous Infusions: . dextrose 5 % and 0.45 % NaCl with KCl 20 mEq/L 100 mL/hr at 01/09/14 0442     Time spent: > 35 minutes    [name], Larwence Tu  [facility] Pager [phone]. If 7PM-7AM, please contact night-coverage at [email or web address], password [email or web address] 01/09/2014, 1:35 PM  LOS: 4 days

## 2014-01-09 NOTE — Progress Notes (Signed)
Patient ID: Richard Sandoval, male   DOB: May 02, 1946, 68 y.o.   MRN: [id number] [facility]  Richard HawkLarry Exton 68 y.o. May 02, 1946   Subjective: Sitting in chair. Feels a lot better today after sleeping better last night. Abdominal pain less (feels like a burning). Denies N/V.  Objective: Vital signs in last 24 hours: Filed Vitals:   01/09/14 1324  BP: 189/80  Pulse: 57  Temp: 99 F (37.2 C)  Resp: 18    Physical Exam: Gen: alert, no acute distress, morbidly obese Abd: less tender in epigastric region with guarding, soft, nondistended, obese  Lab Results:  Recent Labs  01/07/14 1035 01/08/14 1209  NA 138 137  K 3.8 4.4  CL 100 99  CO2 26 27  GLUCOSE 116* 113*  BUN 11 9  CREATININE 0.98 0.90  CALCIUM 9.2 9.1    Recent Labs  01/07/14 1035 01/08/14 1209  AST 109* 82*  ALT 143* 117*  ALKPHOS 93 87  BILITOT 2.4* 1.9*  PROT 7.8 8.2  ALBUMIN 3.3* 3.4*    Recent Labs  01/08/14 1209  WBC 9.0  NEUTROABS 6.2  HGB 15.1  HCT 44.5  MCV 88.6  PLT 183   No results found for this basename: LABPROT, INR,  in the last 72 hours    Assessment/Plan: Resolving acute pancreatitis likely biliary in origin but no gallstones or cholecystitis noted on imaging. LFTs improving. May need an EUS as an outpt to further evaluate his pancreatitis (will discuss at his f/u). Advance diet to low fat diet. F/U with me in 1-2 months. Will sign off. Call if questions.   Cowen Pesqueira C. 01/09/2014, 3:08 PM

## 2014-01-09 NOTE — Progress Notes (Signed)
Pt places self on and off CPAP at night. Pt CPAP is set on Auto 7 min. 20 max. With a a nasal mask. Pt requested that no sterile water be put into his machine due to water leaking out on a previous night. RT made pt aware that if he needed any help or had any questions to call.

## 2014-01-10 MED ORDER — PANTOPRAZOLE SODIUM 40 MG PO TBEC
40.0000 mg | DELAYED_RELEASE_TABLET | Freq: Two times a day (BID) | ORAL | Status: DC
  Administered 2014-01-10 – 2014-01-11 (×2): 40 mg via ORAL
  Filled 2014-01-10 (×3): qty 1

## 2014-01-10 NOTE — Care Management Note (Signed)
    Page 1 of 1   01/10/2014     3:13:34 PM CARE MANAGEMENT NOTE 01/10/2014  Patient:  Richard Sandoval,Richard Sandoval   Account Number:  [id number]  Date Initiated:  01/09/2014  Documentation initiated by:  DAVIS,RHONDA  Subjective/Objective Assessment:   pt with confirmed pancretitis     Action/Plan:   tbd   Anticipated DC Date:  01/10/2014   Anticipated DC Plan:  HOME/SELF CARE      DC Planning Services  CM consult      Choice offered to / List presented to:             Status of service:  Completed, signed off Medicare Important Message given?  NA - LOS <3 / Initial given by admissions (If response is "NO", the following Medicare IM given date fields will be blank) Date Medicare IM given:   Date Additional Medicare IM given:    Discharge Disposition:  HOME/SELF CARE  Per UR Regulation:  Reviewed for med. necessity/level of care/duration of stay  If discussed at Long Length of Stay Meetings, dates discussed:    Comments:  01/10/14 [facility] MAHABIR RN,BSN NCM [phone] D/C HOME NO NEEDS.  [id number]/Rhonda Earlene Plateravis, RN, BSN, CCM: Case management. [phone] Chart reviewed and updated.  Next chart review due on [id number]. Needs for discharge at time of review:  none

## 2014-01-10 NOTE — Progress Notes (Signed)
[facility] PROGRESS NOTE  Richard HawkLarry Sandoval VQQ:[id number] DOB: May 11, 1946 DOA: 01/05/2014 PCP: Richard BradfordWHITE,[name], MD Brief narrative: 68 year old that presented with abdominal discomfort and elevated lipase. Being treated for acute pancreatitis. GI evaluation while patient in house. Plans are to advance diet and patient followup with GI as outpatient for further evaluation.  Assessment/Plan: Pancreatitis - Triglyceride level within normal limits - Lipase trended down more than 3000 initially and on last check 12 - Ultrasound of abdomen reports limited exam due to patient body habitus and overlying bowel gas with reports of no acute abnormalities. Does report no gallstones or wall thickening and no sonographic Murphy sign noted. - Consulted GI - Plan is to advance diet and have patient followup with GI once medically cleared for discharge for further evaluation. Today will advance diet to low fat diet.    Abnormal transaminases - GI on board, trending down with bowel rest.  Active Problems:   OSA (obstructive sleep apnea)   - stable pt on CPAP  Code Status: full Family Communication: no family at bedside. Disposition Plan: If patient tolerated low fat diet may consider d/c    Consultants:  Gastroenterology  Procedures:  Right upper quadrant ultrasound  Antibiotics:  None  HPI/Subjective: Patient states he less abdominal pain and currently feels better.  Objective: Filed Vitals:   01/10/14 1358  BP: 143/81  Pulse: 73  Temp: 98 F (36.7 C)  Resp: 18    Intake/Output Summary (Last 24 hours) at 01/10/14 1636 Last data filed at 01/10/14 1400  Gross per 24 hour  Intake   2400 ml  Output   1600 ml  Net    800 ml   Filed Weights   01/05/14 1751 01/08/14 1145  Weight: 200.49 kg (442 lb) 194.276 kg (428 lb 4.8 oz)    Exam:   General:  Pt in NAD, alert and awake  Cardiovascular: RRR, no MRG  Respiratory: no wheezes, no increased work of  breathing  Abdomen: obese, ND  Musculoskeletal: no cyanosis or clubbing   Data Reviewed: Basic Metabolic Panel:  Recent Labs Lab 01/05/14 1350 01/06/14 0420 01/07/14 1035 01/08/14 1209  NA 140 142 138 137  K 4.1 3.6* 3.8 4.4  CL 101 104 100 99  CO2 24 25 26 27   GLUCOSE 158* 114* 116* 113*  BUN 13 12 11 9   CREATININE 0.94 1.03 0.98 0.90  CALCIUM 9.6 8.9 9.2 9.1   Liver Function Tests:  Recent Labs Lab 01/05/14 1350 01/06/14 0420 01/07/14 1035 01/08/14 1209  AST 209* 185* 109* 82*  ALT 125* 181* 143* 117*  ALKPHOS 105 92 93 87  BILITOT 2.4* 4.8* 2.4* 1.9*  PROT 8.4* 7.3 7.8 8.2  ALBUMIN 3.9 3.3* 3.3* 3.4*    Recent Labs Lab 01/05/14 1350 01/06/14 0420 01/07/14 1035  LIPASE >3000* 289* 12   No results found for this basename: AMMONIA,  in the last 168 hours CBC:  Recent Labs Lab 01/05/14 1350 01/06/14 0420 01/08/14 1209  WBC 11.1* 10.0 9.0  NEUTROABS  --   --  6.2  HGB 15.5 14.0 15.1  HCT 47.0 43.8 44.5  MCV 87.4 88.8 88.6  PLT 180 178 183   Cardiac Enzymes:  Recent Labs Lab 01/05/14 1743 01/06/14 0015 01/06/14 0420  TROPONINI <0.30 <0.30 <0.30   BNP (last 3 results)  Recent Labs  01/05/14 1350  PROBNP 209.9*   CBG: No results found for this basename: GLUCAP,  in the last 168 hours  No results found for this or  any previous visit (from the past 240 hour(Sandoval)).   Studies: No results found.  Scheduled Meds: . enoxaparin (LOVENOX) injection  100 mg Subcutaneous Q24H  . mupirocin ointment  1 application Nasal TID  . pantoprazole (PROTONIX) IV  40 mg Intravenous Q12H   Continuous Infusions:     Time spent: > 35 minutes    [name], [place]  [facility] Pager [phone]. If 7PM-7AM, please contact night-coverage at [email or web address], password [email or web address] 01/10/2014, 4:36 PM  LOS: 5 days

## 2014-01-11 NOTE — Progress Notes (Signed)
Assessment unchanged.  All questions pertaining to D/C we answered.  Pt was D/C'd via wheelchair and accompanied by NTs.  Sherron MondayGood, Jessica L

## 2014-01-11 NOTE — Discharge Summary (Signed)
Physician Discharge Summary  Richard Sandoval [id number] DOB: 11-01-1945 DOA: 01/05/2014  PCP: Richard BradfordWHITE,[name], MD  Admit date: 01/05/2014 Discharge date: 01/11/2014  Time spent: 45* minutes  Recommendations for Outpatient Follow-up:  1. *Follow up PCP in one week 2. Follow up GI in one week  Discharge Diagnoses:  Active Problems:   OBESITY, NOS   OSA (obstructive sleep apnea)   Pancreatitis   Abnormal transaminases   Discharge Condition: Stable  Diet recommendation: Low salt diet  Filed Weights   01/05/14 1751 01/08/14 1145  Weight: 200.49 kg (442 lb) 194.276 kg (428 lb 4.8 oz)    History of present illness:  68 y.o. male with PMH significant for Hypertension not on medications, OSA, who presents to ED complaining of abdominal, nausea and SOB that started the morning of admission. He relates abdominal pain, sharp in quality, squeezing in quality, 10/10, started initially lower quadrant, then radiate to mid abdomen. It was associated with dyspnea and nausea. He denies alcohol intake, no new medications. He has had 3 MB today soft stool. He is breathing better,he denies chest pain.      Hospital Course:   Pancreatitis  Resolved - Triglyceride level within normal limits  - Lipase trended down more than 3000 initially and on last check 12  - Ultrasound of abdomen reports limited exam due to patient body habitus and overlying bowel gas with reports of no acute abnormalities. Does report no gallstones or wall thickening and no sonographic Murphy sign noted.  - Consulted GI, and they plan for endoscopic ultrasound as outpatient. Patient to follow up Richard Sandoval in one week    Procedures:  None  Consultations:  GI  Discharge Exam: Filed Vitals:   01/11/14 0610  BP: 151/84  Pulse: 71  Temp: 97.4 F (36.3 Sandoval)  Resp: 18    General: Appear in no acute distress Cardiovascular: S1s2 RRR Respiratory: Clear bilaterally  Discharge Instructions You were cared for by a  hospitalist during your hospital stay. If you have any questions about your discharge medications or the care you received while you were in the hospital after you are discharged, you can call the unit and asked to speak with the hospitalist on call if the hospitalist that took care of you is not available. Once you are discharged, your primary care physician will handle any further medical issues. Please note that NO REFILLS for any discharge medications will be authorized once you are discharged, as it is imperative that you return to your primary care physician (or establish a relationship with a primary care physician if you do not have one) for your aftercare needs so that they can reassess your need for medications and monitor your lab values.  Discharge Instructions   Diet - low sodium heart healthy    Complete by:  As directed      Increase activity slowly    Complete by:  As directed             Medication List    STOP taking these medications       ibuprofen 200 MG tablet  Commonly known as:  ADVIL,MOTRIN      TAKE these medications       aspirin 81 MG tablet  Take 81 mg by mouth daily.     cetirizine 10 MG tablet  Commonly known as:  ZYRTEC  Take 10 mg by mouth daily.     FLUoxetine 20 MG tablet  Commonly known as:  PROZAC  Take 20  mg by mouth daily.     mupirocin ointment 2 %  Commonly known as:  BACTROBAN  Place 1 application into the nose 3 (three) times daily.       Allergies  Allergen Reactions  . Penicillins Itching       Follow-up Information   Follow up with Richard Sandoval., MD. Schedule an appointment as soon as possible for a visit in 1 week.   Specialty:  Gastroenterology   Contact information:   [address] [place] [address] [phone]       Follow up with Richard BradfordWHITE,[name], MD In 1 week.   Specialty:  Family Medicine   Contact information:   [address], [address] [place] [address] [phone]         The results of significant diagnostics from this hospitalization (including imaging, microbiology, ancillary and laboratory) are listed below for reference.    Significant Diagnostic Studies: Dg Chest 2 View  01/05/2014   CLINICAL DATA:  Short of breath.  Right-sided chest pain.  EXAM: CHEST  2 VIEW  COMPARISON:  09/12/2013.  FINDINGS: Cardiopericardial silhouette within normal limits. Mediastinal contours normal. Trachea midline. No airspace disease or effusion.  IMPRESSION: No active cardiopulmonary disease.   Electronically Signed   By: Andreas NewportGeoffrey  Lamke M.D.   On: 01/05/2014 17:58   Ct Abdomen W Contrast  01/08/2014   CLINICAL DATA:  Evaluate biliary tree and pancreas.  Pancreatitis.  EXAM: CT ABDOMEN WITH CONTRAST  TECHNIQUE: Multidetector CT imaging of the abdomen was performed using the standard protocol following bolus administration of intravenous contrast.  CONTRAST:  50mL OMNIPAQUE IOHEXOL 300 MG/ML SOLN, 125mL OMNIPAQUE IOHEXOL 300 MG/ML SOLN  COMPARISON:  None.  FINDINGS: No pleural or pericardial effusion identified. The lung bases are clear.  No focal liver abnormality identified. The gallbladder is normal. No biliary dilatation. Mild diffuse edema of the pancreas with mild peripancreatic fat stranding. No evidence for pancreatic necrosis or pseudocyst. No pancreatic duct dilatation. The spleen is normal.  The adrenal glands are normal. Normal appearance of both kidneys. Normal caliber of the abdominal aorta. Gastrohepatic ligament lymph node measures 1.9 cm. Celiac lymph node measures 1.5 cm, image 21/series 2. No periaortic or aortocaval adenopathy.  There is no free fluid or abnormal fluid collections within the upper abdomen. Normal appearance of the stomach. The small bowel loops op appear normal. Normal appearance of the colon.  Review of the visualized bony structures is significant for previous hardware fixation within the lower lumbar spine.  IMPRESSION: 1. Pancreatitis. 2. No  complications identified.   Electronically Signed   By: Signa Kellaylor  Stroud M.D.   On: 01/08/2014 14:28   [place] Abdomen Complete  01/05/2014   CLINICAL DATA:  *Abdominal pain and elevated LFTs  EXAM: ULTRASOUND ABDOMEN COMPLETE  COMPARISON:  05/24/2010.  FINDINGS: Gallbladder:  No gallstones or wall thickening visualized. No sonographic Murphy sign noted.  Common bile duct:  Diameter: 6 mm.  Liver:  Increase in echogenicity consistent with fatty infiltration. This is similar to that seen on the prior exam.  IVC:  Not well visualized.  Pancreas:  Not well visualized.  The visualized tail is within normal limits.  Spleen:  Size and appearance within normal limits.  Right Kidney:  Length: 13 cm in length. Echogenicity within normal limits. No mass or hydronephrosis visualized.  Left Kidney:  Length: 13.1 cm in length. Echogenicity within normal limits. No mass or hydronephrosis visualized.  Abdominal aorta:  No aneurysm visualized.  Other findings:  None.  IMPRESSION: Limited exam due to patient body habitus and overlying bowel gas. No acute abnormality is seen.   Electronically Signed   By: Alcide Clever M.D.   On: 01/05/2014 16:26    Microbiology: No results found for this or any previous visit (from the past 240 hour(Sandoval)).   Labs: Basic Metabolic Panel:  Recent Labs Lab 01/05/14 1350 01/06/14 0420 01/07/14 1035 01/08/14 1209  NA 140 142 138 137  K 4.1 3.6* 3.8 4.4  CL 101 104 100 99  CO2 24 25 26 27   GLUCOSE 158* 114* 116* 113*  BUN 13 12 11 9   CREATININE 0.94 1.03 0.98 0.90  CALCIUM 9.6 8.9 9.2 9.1   Liver Function Tests:  Recent Labs Lab 01/05/14 1350 01/06/14 0420 01/07/14 1035 01/08/14 1209  AST 209* 185* 109* 82*  ALT 125* 181* 143* 117*  ALKPHOS 105 92 93 87  BILITOT 2.4* 4.8* 2.4* 1.9*  PROT 8.4* 7.3 7.8 8.2  ALBUMIN 3.9 3.3* 3.3* 3.4*    Recent Labs Lab 01/05/14 1350 01/06/14 0420 01/07/14 1035  LIPASE >3000* 289* 12   No results found for this basename: AMMONIA,  in  the last 168 hours CBC:  Recent Labs Lab 01/05/14 1350 01/06/14 0420 01/08/14 1209  WBC 11.1* 10.0 9.0  NEUTROABS  --   --  6.2  HGB 15.5 14.0 15.1  HCT 47.0 43.8 44.5  MCV 87.4 88.8 88.6  PLT 180 178 183   Cardiac Enzymes:  Recent Labs Lab 01/05/14 1743 01/06/14 0015 01/06/14 0420  TROPONINI <0.30 <0.30 <0.30   BNP: BNP (last 3 results)  Recent Labs  01/05/14 1350  PROBNP 209.9*   CBG: No results found for this basename: GLUCAP,  in the last 168 hours     Signed:  LAMA,GAGAN Sandoval  [facility] 01/11/2014, 1:12 PM

## 2014-01-20 DIAGNOSIS — K859 Acute pancreatitis without necrosis or infection, unspecified: Secondary | ICD-10-CM | POA: Diagnosis not present

## 2014-01-20 DIAGNOSIS — Z713 Dietary counseling and surveillance: Secondary | ICD-10-CM | POA: Diagnosis not present

## 2014-01-20 DIAGNOSIS — E669 Obesity, unspecified: Secondary | ICD-10-CM | POA: Diagnosis not present

## 2014-01-21 ENCOUNTER — Emergency Department (HOSPITAL_COMMUNITY): Payer: Managed Care, Other (non HMO)

## 2014-01-21 ENCOUNTER — Inpatient Hospital Stay (HOSPITAL_COMMUNITY)
Admission: EM | Admit: 2014-01-21 | Discharge: 2014-01-27 | DRG: 418 | Disposition: A | Discharge status: Home / Self Care | Payer: Managed Care, Other (non HMO) | Attending: Internal Medicine | Admitting: Internal Medicine

## 2014-01-21 ENCOUNTER — Encounter (HOSPITAL_COMMUNITY): Payer: Self-pay | Admitting: Emergency Medicine

## 2014-01-21 DIAGNOSIS — F3289 Other specified depressive episodes: Secondary | ICD-10-CM | POA: Diagnosis not present

## 2014-01-21 DIAGNOSIS — K8500 Idiopathic acute pancreatitis without necrosis or infection: Secondary | ICD-10-CM

## 2014-01-21 DIAGNOSIS — R109 Unspecified abdominal pain: Secondary | ICD-10-CM | POA: Diagnosis not present

## 2014-01-21 DIAGNOSIS — R7401 Elevation of levels of liver transaminase levels: Secondary | ICD-10-CM | POA: Diagnosis not present

## 2014-01-21 DIAGNOSIS — R110 Nausea: Secondary | ICD-10-CM | POA: Diagnosis not present

## 2014-01-21 DIAGNOSIS — R740 Nonspecific elevation of levels of transaminase and lactic acid dehydrogenase [LDH]: Secondary | ICD-10-CM

## 2014-01-21 DIAGNOSIS — K851 Biliary acute pancreatitis without necrosis or infection: Secondary | ICD-10-CM | POA: Diagnosis present

## 2014-01-21 DIAGNOSIS — I10 Essential (primary) hypertension: Secondary | ICD-10-CM | POA: Diagnosis not present

## 2014-01-21 DIAGNOSIS — R112 Nausea with vomiting, unspecified: Secondary | ICD-10-CM | POA: Diagnosis not present

## 2014-01-21 DIAGNOSIS — Z6841 Body Mass Index (BMI) 40.0 and over, adult: Secondary | ICD-10-CM

## 2014-01-21 DIAGNOSIS — E876 Hypokalemia: Secondary | ICD-10-CM | POA: Diagnosis present

## 2014-01-21 DIAGNOSIS — G8929 Other chronic pain: Secondary | ICD-10-CM | POA: Diagnosis present

## 2014-01-21 DIAGNOSIS — G4733 Obstructive sleep apnea (adult) (pediatric): Secondary | ICD-10-CM | POA: Diagnosis not present

## 2014-01-21 DIAGNOSIS — Z880 Allergy status to penicillin: Secondary | ICD-10-CM

## 2014-01-21 DIAGNOSIS — F329 Major depressive disorder, single episode, unspecified: Secondary | ICD-10-CM

## 2014-01-21 DIAGNOSIS — R748 Abnormal levels of other serum enzymes: Secondary | ICD-10-CM | POA: Diagnosis present

## 2014-01-21 DIAGNOSIS — K859 Acute pancreatitis without necrosis or infection, unspecified: Secondary | ICD-10-CM | POA: Diagnosis not present

## 2014-01-21 DIAGNOSIS — Z79899 Other long term (current) drug therapy: Secondary | ICD-10-CM

## 2014-01-21 DIAGNOSIS — Z86718 Personal history of other venous thrombosis and embolism: Secondary | ICD-10-CM

## 2014-01-21 DIAGNOSIS — R1033 Periumbilical pain: Secondary | ICD-10-CM | POA: Diagnosis not present

## 2014-01-21 DIAGNOSIS — R1013 Epigastric pain: Secondary | ICD-10-CM | POA: Diagnosis not present

## 2014-01-21 DIAGNOSIS — R7402 Elevation of levels of lactic acid dehydrogenase (LDH): Secondary | ICD-10-CM | POA: Diagnosis present

## 2014-01-21 DIAGNOSIS — M549 Dorsalgia, unspecified: Secondary | ICD-10-CM | POA: Diagnosis present

## 2014-01-21 DIAGNOSIS — Z7982 Long term (current) use of aspirin: Secondary | ICD-10-CM

## 2014-01-21 DIAGNOSIS — K8010 Calculus of gallbladder with chronic cholecystitis without obstruction: Secondary | ICD-10-CM | POA: Diagnosis present

## 2014-01-21 DIAGNOSIS — K861 Other chronic pancreatitis: Secondary | ICD-10-CM | POA: Diagnosis present

## 2014-01-21 DIAGNOSIS — Z803 Family history of malignant neoplasm of breast: Secondary | ICD-10-CM

## 2014-01-21 DIAGNOSIS — R7989 Other specified abnormal findings of blood chemistry: Secondary | ICD-10-CM | POA: Diagnosis present

## 2014-01-21 HISTORY — DX: Morbid (severe) obesity due to excess calories: E66.01

## 2014-01-21 HISTORY — DX: Acute pancreatitis without necrosis or infection, unspecified: K85.90

## 2014-01-21 LAB — COMPREHENSIVE METABOLIC PANEL
ALBUMIN: 3.5 g/dL (ref 3.5–5.2)
ALK PHOS: 83 U/L (ref 39–117)
ALT: 40 U/L (ref 0–53)
AST: 61 U/L — ABNORMAL HIGH (ref 0–37)
BUN: 16 mg/dL (ref 6–23)
CHLORIDE: 98 meq/L (ref 96–112)
CO2: 23 mEq/L (ref 19–32)
Calcium: 9.1 mg/dL (ref 8.4–10.5)
Creatinine, Ser: 0.92 mg/dL (ref 0.50–1.35)
GFR calc Af Amer: >90 mL/min (ref >90)
GFR calc non Af Amer: 85 mL/min — ABNORMAL LOW (ref >90)
Glucose, Bld: 151 mg/dL — ABNORMAL HIGH (ref 70–99)
POTASSIUM: 4.2 meq/L (ref 3.7–5.3)
Sodium: 134 mEq/L — ABNORMAL LOW (ref 137–147)
TOTAL PROTEIN: 8.2 g/dL (ref 6.0–8.3)
Total Bilirubin: 1 mg/dL (ref 0.3–1.2)

## 2014-01-21 LAB — URINALYSIS, ROUTINE W REFLEX MICROSCOPIC
BILIRUBIN URINE: NEGATIVE
GLUCOSE, UA: NEGATIVE mg/dL
Hgb urine dipstick: NEGATIVE
Ketones, ur: NEGATIVE mg/dL
LEUKOCYTES UA: NEGATIVE
NITRITE: NEGATIVE
PH: 7.5 (ref 5.0–8.0)
Protein, ur: NEGATIVE mg/dL
SPECIFIC GRAVITY, URINE: 1.019 (ref 1.005–1.030)
Urobilinogen, UA: 2 mg/dL — ABNORMAL HIGH (ref 0.0–1.0)

## 2014-01-21 LAB — CBC WITH DIFFERENTIAL/PLATELET
Basophils Absolute: 0 10*3/uL (ref 0.0–0.1)
Basophils Relative: 0 % (ref 0–1)
EOS ABS: 0 10*3/uL (ref 0.0–0.7)
Eosinophils Relative: 1 % (ref 0–5)
HCT: 45.3 % (ref 39.0–52.0)
Hemoglobin: 14.9 g/dL (ref 13.0–17.0)
Lymphocytes Relative: 18 % (ref 12–46)
Lymphs Abs: 1.3 10*3/uL (ref 0.7–4.0)
MCH: 28.7 pg (ref 26.0–34.0)
MCHC: 32.9 g/dL (ref 30.0–36.0)
MCV: 87.1 fL (ref 78.0–100.0)
MONOS PCT: 5 % (ref 3–12)
Monocytes Absolute: 0.4 10*3/uL (ref 0.1–1.0)
NEUTROS PCT: 76 % (ref 43–77)
Neutro Abs: 5.2 10*3/uL (ref 1.7–7.7)
Platelets: 255 10*3/uL (ref 150–400)
RBC: 5.2 MIL/uL (ref 4.22–5.81)
RDW: 13.4 % (ref 11.5–15.5)
WBC: 6.8 10*3/uL (ref 4.0–10.5)

## 2014-01-21 LAB — LIPASE, BLOOD: LIPASE: 663 U/L — AB (ref 11–59)

## 2014-01-21 LAB — MRSA PCR SCREENING: MRSA BY PCR: NEGATIVE

## 2014-01-21 MED ORDER — SODIUM CHLORIDE 0.9 % IV SOLN: INTRAVENOUS | Status: DC

## 2014-01-21 MED ORDER — FLUOXETINE HCL 20 MG PO TABS
20.0000 mg | ORAL_TABLET | Freq: Every day | ORAL | Status: DC
  Administered 2014-01-22 – 2014-01-26 (×4): 20 mg via ORAL
  Filled 2014-01-21 (×6): qty 1

## 2014-01-21 MED ORDER — ASPIRIN 81 MG PO TABS
81.0000 mg | ORAL_TABLET | Freq: Every day | ORAL | Status: DC
Start: 2014-01-21 — End: 2014-01-21

## 2014-01-21 MED ORDER — HYDROCODONE-ACETAMINOPHEN 5-325 MG PO TABS
1.0000 | ORAL_TABLET | ORAL | Status: DC | PRN
  Administered 2014-01-23 (×2): 2 via ORAL
  Filled 2014-01-21 (×2): qty 2

## 2014-01-21 MED ORDER — ONDANSETRON HCL 4 MG PO TABS: 4.0000 mg | ORAL_TABLET | Freq: Four times a day (QID) | ORAL | Status: DC | PRN

## 2014-01-21 MED ORDER — PANTOPRAZOLE SODIUM 40 MG IV SOLR
40.0000 mg | Freq: Two times a day (BID) | INTRAVENOUS | Status: DC
  Administered 2014-01-21 – 2014-01-26 (×11): 40 mg via INTRAVENOUS
  Filled 2014-01-21 (×14): qty 40

## 2014-01-21 MED ORDER — ENOXAPARIN SODIUM 100 MG/ML ~~LOC~~ SOLN
100.0000 mg | SUBCUTANEOUS | Status: DC
  Administered 2014-01-21 – 2014-01-24 (×4): 100 mg via SUBCUTANEOUS
  Filled 2014-01-21 (×5): qty 1

## 2014-01-21 MED ORDER — MORPHINE SULFATE 2 MG/ML IJ SOLN
1.0000 mg | INTRAMUSCULAR | Status: DC | PRN
  Administered 2014-01-21 – 2014-01-22 (×4): 1 mg via INTRAVENOUS
  Filled 2014-01-21 (×4): qty 1

## 2014-01-21 MED ORDER — HYDROMORPHONE HCL PF 1 MG/ML IJ SOLN
0.5000 mg | INTRAMUSCULAR | Status: AC | PRN
  Administered 2014-01-21 (×3): 0.5 mg via INTRAVENOUS
  Filled 2014-01-21 (×3): qty 1

## 2014-01-21 MED ORDER — HYDROMORPHONE HCL PF 1 MG/ML IJ SOLN
1.0000 mg | INTRAMUSCULAR | Status: DC | PRN
  Administered 2014-01-21: 1 mg via INTRAVENOUS
  Filled 2014-01-21: qty 1

## 2014-01-21 MED ORDER — ONDANSETRON HCL 4 MG/2ML IJ SOLN
4.0000 mg | Freq: Once | INTRAMUSCULAR | Status: AC
  Administered 2014-01-21: 4 mg via INTRAVENOUS
  Filled 2014-01-21: qty 2

## 2014-01-21 MED ORDER — ENOXAPARIN SODIUM 100 MG/ML ~~LOC~~ SOLN
100.0000 mg | SUBCUTANEOUS | Status: DC
  Filled 2014-01-21: qty 1

## 2014-01-21 MED ORDER — ACETAMINOPHEN 325 MG PO TABS: 650.0000 mg | ORAL_TABLET | Freq: Four times a day (QID) | ORAL | Status: DC | PRN

## 2014-01-21 MED ORDER — ASPIRIN 81 MG PO CHEW
81.0000 mg | CHEWABLE_TABLET | Freq: Every day | ORAL | Status: DC
  Administered 2014-01-22 – 2014-01-26 (×4): 81 mg via ORAL
  Filled 2014-01-21 (×7): qty 1

## 2014-01-21 MED ORDER — SODIUM CHLORIDE 0.9 % IV SOLN
INTRAVENOUS | Status: DC
  Administered 2014-01-21 – 2014-01-22 (×3): via INTRAVENOUS
  Administered 2014-01-23: 1000 mL via INTRAVENOUS
  Administered 2014-01-23 – 2014-01-24 (×3): via INTRAVENOUS

## 2014-01-21 MED ORDER — ONDANSETRON HCL 4 MG/2ML IJ SOLN
4.0000 mg | Freq: Three times a day (TID) | INTRAMUSCULAR | Status: DC | PRN
  Administered 2014-01-21: 4 mg via INTRAVENOUS
  Filled 2014-01-21: qty 2

## 2014-01-21 MED ORDER — SODIUM CHLORIDE 0.9 % IV SOLN
1000.0000 mL | Freq: Once | INTRAVENOUS | Status: AC
  Administered 2014-01-21: 1000 mL via INTRAVENOUS

## 2014-01-21 MED ORDER — DOCUSATE SODIUM 100 MG PO CAPS
100.0000 mg | ORAL_CAPSULE | Freq: Two times a day (BID) | ORAL | Status: DC
  Administered 2014-01-23 – 2014-01-26 (×6): 100 mg via ORAL
  Filled 2014-01-21 (×14): qty 1

## 2014-01-21 MED ORDER — ONDANSETRON HCL 4 MG/2ML IJ SOLN
4.0000 mg | Freq: Four times a day (QID) | INTRAMUSCULAR | Status: DC | PRN
  Administered 2014-01-21: 4 mg via INTRAVENOUS
  Filled 2014-01-21: qty 2

## 2014-01-21 MED ORDER — ACETAMINOPHEN 650 MG RE SUPP: 650.0000 mg | Freq: Four times a day (QID) | RECTAL | Status: DC | PRN

## 2014-01-21 MED ORDER — SODIUM CHLORIDE 0.9 % IV SOLN
1000.0000 mL | INTRAVENOUS | Status: DC
  Administered 2014-01-21: 1000 mL via INTRAVENOUS

## 2014-01-21 NOTE — ED Provider Notes (Addendum)
CSN: [id number]     Arrival date & time 01/21/14  [id number] History   First MD Initiated Contact with Patient 01/21/14 0830     Chief Complaint  Patient presents with  . Abdominal Pain    Patient is a 68 y.o. male presenting with abdominal pain. The history is provided by the patient.  Abdominal Pain Pain location:  Epigastric and periumbilical Pain quality: aching and sharp   Pain radiates to:  Does not radiate Pain severity:  Moderate Onset quality:  Gradual Duration:  1 day Timing:  Constant Progression:  Worsening Chronicity:  Recurrent Context: not alcohol use and not diet changes   Relieved by:  Nothing Associated symptoms: nausea and vomiting   Associated symptoms: no diarrhea and no fever   Pt was in the hospital within the last couple of weeks.  He was discharged on 6/17 after being admitted to the hospital with acute pancreatitis.  He does not drink alcohol and the etiology is unclear.  He has seen GI and is scheduled for additional testing. His symptoms had improved and he was feeling fine last week until the symptoms started last night.  It does feel similar to his prior flare up.  He has sleep apnea and whenever he lies flat he needs to use cpap.  He feels better sitting up. Past Medical History  Diagnosis Date  . DVT (deep venous thrombosis)     L leg in 2009  . Hypertension     enlarged heart  . OSA (obstructive sleep apnea)     on CPAP since 1995  . Pancreatitis   . Morbid obesity    Past Surgical History  Procedure Laterality Date  . Back surgery  04/09/1999  . Knee surgery  1998  . Left inguinal hernia    . Lumbar laminectomy    . Cervical laminectomy and fusion    . Trigger finger release  08/04/2012    Procedure: MINOR RELEASE TRIGGER FINGER/A-1 PULLEY;  Surgeon: Nicki ReaperGary R Kuzma, MD;  Location: [facility];  Service: Orthopedics;  Laterality: Right;   Family History  Problem Relation Age of Onset  . Breast cancer Mother    History  Substance  Use Topics  . Smoking status: Never Smoker   . Smokeless tobacco: Never Used  . Alcohol Use: No    Review of Systems  Constitutional: Negative for fever.  Gastrointestinal: Positive for nausea, vomiting and abdominal pain. Negative for diarrhea.  All other systems reviewed and are negative.     Allergies  Penicillins  Home Medications   Prior to Admission medications   Medication Sig Start Date End Date Taking? Authorizing Kaylob Wallen  aspirin 81 MG tablet Take 81 mg by mouth daily.   Yes Historical Renatha Rosen, MD  cetirizine (ZYRTEC) 10 MG tablet Take 10 mg by mouth daily.   Yes Historical Babs Dabbs, MD  FLUoxetine (PROZAC) 20 MG tablet Take 20 mg by mouth daily.   Yes Historical Malika Demario, MD  mupirocin ointment (BACTROBAN) 2 % Place 1 application into the nose 3 (three) times daily.   Yes Historical Capri Veals, MD   BP 189/82  Pulse 73  Temp(Src) 99.9 F (37.7 C) (Oral)  Resp 18  Ht 5\' 11"  (1.803 m)  Wt 426 lb (193.232 kg)  BMI 59.44 kg/m2  SpO2 97% Physical Exam  Nursing note and vitals reviewed. Constitutional: No distress.  Morbidly obese   HENT:  Head: Normocephalic and atraumatic.  Right Ear: External ear normal.  Left Ear: External ear  normal.  Eyes: Conjunctivae are normal. Right eye exhibits no discharge. Left eye exhibits no discharge. No scleral icterus.  Neck: Neck supple. No tracheal deviation present.  Cardiovascular: Normal rate, regular rhythm and intact distal pulses.   Pulmonary/Chest: Effort normal and breath sounds normal. No stridor. No respiratory distress. He has no wheezes. He has no rales.  Abdominal: Soft. Bowel sounds are normal. He exhibits no distension and no mass. There is tenderness in the epigastric area. There is no rebound and no guarding. No hernia.  Exam limited as pt is sitting in a chair  Musculoskeletal: He exhibits edema. He exhibits no tenderness.  Neurological: He is alert. He has normal strength. No cranial nerve deficit (no  facial droop, extraocular movements intact, no slurred speech) or sensory deficit. He exhibits normal muscle tone. He displays no seizure activity. Coordination normal.  Skin: Skin is warm and dry. No rash noted.  Psychiatric: He has a normal mood and affect.    ED Course  Procedures  Prior imaging and records reviewed.  Medications  0.9 %  sodium chloride infusion (1,000 mLs Intravenous New Bag/Given 01/21/14 0847)    Followed by  0.9 %  sodium chloride infusion (not administered)  HYDROmorphone (DILAUDID) injection 0.5 mg (0.5 mg Intravenous Given 01/21/14 1015)  ondansetron (ZOFRAN) injection 4 mg (4 mg Intravenous Given 01/21/14 0847)  ondansetron (ZOFRAN) injection 4 mg (4 mg Intravenous Given 01/21/14 0914)    1100  Pt is more comfortable after treatment.  Wants to lie down so requested that he start his home BIPAP which he always uses when lying down.  Labs Review Labs Reviewed  COMPREHENSIVE METABOLIC PANEL - Abnormal; Notable for the following:    Sodium 134 (*)    Glucose, Bld 151 (*)    AST 61 (*)    GFR calc non Af Amer 85 (*)    All other components within normal limits  LIPASE, BLOOD - Abnormal; Notable for the following:    Lipase 663 (*)    All other components within normal limits  CBC WITH DIFFERENTIAL  URINALYSIS, ROUTINE W REFLEX MICROSCOPIC    Imaging Review Dg Abd Acute W/chest  01/21/2014   CLINICAL DATA:  Nausea, abdominal pain, history of pancreatitis  EXAM: ACUTE ABDOMEN SERIES (ABDOMEN 2 VIEW & CHEST 1 VIEW)  COMPARISON:  01/08/2014, 01/05/2014.  FINDINGS: Cardiomediastinal silhouette is unremarkable. No acute infiltrate or pleural effusion. No pulmonary edema. There is nonspecific nonobstructive bowel gas pattern. Again noted postsurgical changes lumbar spine. Study is limited by patient's large body habitus. No free abdominal air.  IMPRESSION: Negative abdominal radiographs.  No acute cardiopulmonary disease.   Electronically Signed   By: Natasha MeadLiviu  Pop M.D.    On: 01/21/2014 10:23     EKG Interpretation   Date/Time:  Saturday January 21 2014 06:57:50 EDT Ventricular Rate:  69 PR Interval:  169 QRS Duration: 103 QT Interval:  441 QTC Calculation: 472 R Axis:   -12 Text Interpretation:  Sinus rhythm Atrial premature complexes Minimal ST  depression, inferior leads No significant change since last tracing  Confirmed by KNAPP  MD-J, JON ([id number]) on 01/21/2014 7:10:52 AM      MDM   Final diagnoses:  Idiopathic acute pancreatitis    Patient is having a recurrent exacerbation of his acute pancreatitis. He had CT imaging during his last admission. I do not feel that repeat CT scan is necessary at this time. Patient's lipase is 663.  This is up from 2 weeks ago when  it was 12.  Will consult the medical service for admission for IV hydration, pain management and bowel rest   Linwood Dibbles, MD 01/21/14 1103  I spoke with [facility] Sprang GI who will see the patient.  Linwood Dibbles, MD 01/21/14 1144

## 2014-01-21 NOTE — ED Notes (Signed)
Pt report pancreatitis flare up starting at 0400 this am. Pt reports n/v at present time. Pt reports normal BM this am.

## 2014-01-21 NOTE — Progress Notes (Signed)
RT found patient on CPAP when arriving to room. PT sleeping. RT will monitor

## 2014-01-21 NOTE — ED Notes (Signed)
Bed: [id number] Expected date:  Expected time:  Means of arrival:  Comments: For triage 3  Needs bariatric bed

## 2014-01-21 NOTE — ED Notes (Signed)
Pt transported to xray. Will administer additional pain medication with pt return.

## 2014-01-21 NOTE — ED Notes (Signed)
EKG given to EDP J Knapp for review 

## 2014-01-21 NOTE — H&P (Signed)
[facility] History and Physical  Richard HawkLarry Feliciano ZOX:[id number] DOB: August 07, 1945 DOA: 01/21/2014  Referring physician: Dr Lynelle DoctorKnapp.  PCP: Cala BradfordWHITE,[name], MD   Chief Complaint: Abdominal pain.   HPI: Richard Sandoval is a 68 y.o. male with PMH significant for morbid obesity, OSA on CPAP, pancreatitis that might be related to biliary source, last admitted to the hospital on 6-11 with Pancreatitis. He was not able to have MRCP because of metal "titanium" rods in his back. US abdomen was limited due to body habitus. Plan was to have US endoscopy as out patient by Dr Bosie ClosSchooler.  Patient was doing well, he even saw Dr Bosie ClosSchooler on Friday. The US endoscopy was schedule for September. Overnight he developed abdominal pain, mid abdomen. Similar to the  pain that he had with pancreatitis. He also relates nausea. He had a BM day prior to admission. Pain is sharp in quality, 10/10. No new medications.   In the ED: lipase at 600, KUB; negative, AST 61. Patient is using CPAP because he wants to rest, and he uses CPAP at home when is lying down flat. He denies dyspnea, chest pain.   Review of Systems:  Negative, except as per HPI.   Past Medical History  Diagnosis Date  . DVT (deep venous thrombosis)     L leg in 2009  . Hypertension     enlarged heart  . OSA (obstructive sleep apnea)     on CPAP since 1995  . Pancreatitis   . Morbid obesity    Past Surgical History  Procedure Laterality Date  . Back surgery  04/09/1999  . Knee surgery  1998  . Left inguinal hernia    . Lumbar laminectomy    . Cervical laminectomy and fusion    . Trigger finger release  08/04/2012    Procedure: MINOR RELEASE TRIGGER FINGER/A-1 PULLEY;  Surgeon: Nicki ReaperGary R Kuzma, MD;  Location: [facility];  Service: Orthopedics;  Laterality: Right;   Social History:  reports that he has never smoked. He has never used smokeless tobacco. He reports that he does not drink alcohol or use illicit drugs.  Allergies    Allergen Reactions  . Penicillins Itching    Family History  Problem Relation Age of Onset  . Breast cancer Mother      Prior to Admission medications   Medication Sig Start Date End Date Taking? Authorizing Provider  aspirin 81 MG tablet Take 81 mg by mouth daily.   Yes Historical Provider, MD  cetirizine (ZYRTEC) 10 MG tablet Take 10 mg by mouth daily.   Yes Historical Provider, MD  FLUoxetine (PROZAC) 20 MG tablet Take 20 mg by mouth daily.   Yes Historical Provider, MD  mupirocin ointment (BACTROBAN) 2 % Place 1 application into the nose 3 (three) times daily.   Yes Historical Provider, MD   Physical Exam: Filed Vitals:   01/21/14 1206  BP: 162/81  Pulse: 78  Temp:   Resp:     BP 162/81  Pulse 78  Temp(Src) 98.7 F (37.1 C) (Oral)  Resp 24  Ht 5\' 11"  (1.803 m)  Wt 193.232 kg (426 lb)  BMI 59.44 kg/m2  SpO2 100%  General:  Appears calm and comfortable Eyes: PERRL, normal lids, irises & conjunctiva ENT: grossly normal hearing, lips & tongue Neck: no LAD, masses or thyromegaly Cardiovascular: RRR, no m/r/g. No LE edema. Telemetry: SR, no arrhythmias  Respiratory: CTA bilaterally, no w/r/r. Normal respiratory effort. Abdomen: soft, ntnd Skin: no rash or induration  seen on limited exam Musculoskeletal: grossly normal tone BUE/BLE Psychiatric: grossly normal mood and affect, speech fluent and appropriate Neurologic: grossly non-focal.          Labs on Admission:  Basic Metabolic Panel:  Recent Labs Lab 01/21/14 0812  NA 134*  K 4.2  CL 98  CO2 23  GLUCOSE 151*  BUN 16  CREATININE 0.92  CALCIUM 9.1   Liver Function Tests:  Recent Labs Lab 01/21/14 0812  AST 61*  ALT 40  ALKPHOS 83  BILITOT 1.0  PROT 8.2  ALBUMIN 3.5    Recent Labs Lab 01/21/14 0812  LIPASE 663*   No results found for this basename: AMMONIA,  in the last 168 hours CBC:  Recent Labs Lab 01/21/14 0812  WBC 6.8  NEUTROABS 5.2  HGB 14.9  HCT 45.3  MCV 87.1  PLT  255   Cardiac Enzymes: No results found for this basename: CKTOTAL, CKMB, CKMBINDEX, TROPONINI,  in the last 168 hours  BNP (last 3 results)  Recent Labs  01/05/14 1350  PROBNP 209.9*   CBG: No results found for this basename: GLUCAP,  in the last 168 hours  Radiological Exams on Admission: Dg Abd Acute W/chest  01/21/2014   CLINICAL DATA:  Nausea, abdominal pain, history of pancreatitis  EXAM: ACUTE ABDOMEN SERIES (ABDOMEN 2 VIEW & CHEST 1 VIEW)  COMPARISON:  01/08/2014, 01/05/2014.  FINDINGS: Cardiomediastinal silhouette is unremarkable. No acute infiltrate or pleural effusion. No pulmonary edema. There is nonspecific nonobstructive bowel gas pattern. Again noted postsurgical changes lumbar spine. Study is limited by patient's large body habitus. No free abdominal air.  IMPRESSION: Negative abdominal radiographs.  No acute cardiopulmonary disease.   Electronically Signed   By: Natasha MeadLiviu  Pop M.D.   On: 01/21/2014 10:23    EKG: Independently reviewed. Sinus rhythm.   Assessment/Plan Principal Problem:   Pancreatitis Active Problems:   OSA (obstructive sleep apnea)   Abnormal transaminases   Morbid obesity   1-Acute Pancreatitis; patient presents with recurrent abdominal pain, lipase at 663. Mild increase AST. Admit to hospital, NPO, IV fluids, IV pain medications, IV protonix. GI consulted. Patient was supposed to have US endoscopy on Sept.   2-OSA; CPAP PRN.   3-Depression; continue with current medications.    Code Status: full code.  Family Communication; care discussed with wife.  Disposition Plan: expect 3 to 4 days inpatient.   Time spent: 75 minutes.   [name], Belkys A [facility] Pager [phone]  **Disclaimer: This note may have been dictated with voice recognition software. Similar sounding words can inadvertently be transcribed and this note may contain transcription errors which may not have been corrected upon publication of note.**

## 2014-01-21 NOTE — ED Notes (Signed)
Pt reports nausea 

## 2014-01-21 NOTE — ED Notes (Signed)
Pt states he is having a pancreatitis flare up   Pt states he was seen here and just discharged last week for same  Pt states he went to his PCP and GI dr yesterday   Pt states this morning he woke up with pain, nausea, and dry heaves  Pt states his chest hurts from heaving  Pt states he had a BM this morning that was normal

## 2014-01-21 NOTE — Consult Note (Signed)
[facility] Consultation Note  Referring Provider: Dr. Hartley BarefootBelkys Regalado ([facility]) Primary Care Physician:  Cala BradfordWHITE,[name], MD Primary Gastroenterologist:  Dr. Charlott RakesVincent Schooler  Reason for Consultation:  Abdominal pain, pancreatitis  HPI: Richard Sandoval is a 68 y.o. male admitted with abdominal pain.  Prior admission two weeks ago for similar pain, with biochemical and radiographic and clinical evidence of acute pancreatitis.  LFTs elevated at that time, not very elevated now.  Patient was discharged couple weeks ago and felt good from GI perspective until last night.  Shortly after eating chicken sandwich at [employer], he began having epigastric discomfort and queasy sensation, identical to his prior episode of pancreatitis.  Had actually seen Dr. Bosie ClosSchooler earlier in the day yesterday and was actually doing well at that time.   Past Medical History  Diagnosis Date  . DVT (deep venous thrombosis)     L leg in 2009  . Hypertension     enlarged heart  . OSA (obstructive sleep apnea)     on CPAP since 1995  . Pancreatitis   . Morbid obesity     Past Surgical History  Procedure Laterality Date  . Back surgery  04/09/1999  . Knee surgery  1998  . Left inguinal hernia    . Lumbar laminectomy    . Cervical laminectomy and fusion    . Trigger finger release  08/04/2012    Procedure: MINOR RELEASE TRIGGER FINGER/A-1 PULLEY;  Surgeon: Nicki ReaperGary R Kuzma, MD;  Location: [facility];  Service: Orthopedics;  Laterality: Right;    Prior to Admission medications   Medication Sig Start Date End Date Taking? Authorizing Provider  aspirin 81 MG tablet Take 81 mg by mouth daily.   Yes Historical Provider, MD  cetirizine (ZYRTEC) 10 MG tablet Take 10 mg by mouth daily.   Yes Historical Provider, MD  FLUoxetine (PROZAC) 20 MG tablet Take 20 mg by mouth daily.   Yes Historical Provider, MD  mupirocin ointment (BACTROBAN) 2 % Place 1 application into the nose 3  (three) times daily.   Yes Historical Provider, MD    Current Facility-Administered Medications  Medication Dose Route Frequency Provider Last Rate Last Dose  . 0.9 %  sodium chloride infusion  1,000 mL Intravenous Continuous Linwood DibblesJon Knapp, MD 125 mL/hr at 01/21/14 1134 1,000 mL at 01/21/14 1134  . 0.9 %  sodium chloride infusion   Intravenous Continuous Belkys A Regalado, MD 125 mL/hr at 01/21/14 1430    . acetaminophen (TYLENOL) tablet 650 mg  650 mg Oral Q6H PRN Belkys A Regalado, MD       Or  . acetaminophen (TYLENOL) suppository 650 mg  650 mg Rectal Q6H PRN Belkys A Regalado, MD      . aspirin chewable tablet 81 mg  81 mg Oral Daily Belkys A Regalado, MD      . docusate sodium (COLACE) capsule 100 mg  100 mg Oral BID Belkys A Regalado, MD      . enoxaparin (LOVENOX) injection 100 mg  100 mg Subcutaneous Q24H Belkys A Regalado, MD      . Melene Muller[START ON 01/22/2014] FLUoxetine (PROZAC) tablet 20 mg  20 mg Oral Daily Belkys A Regalado, MD      . HYDROcodone-acetaminophen (NORCO/VICODIN) 5-325 MG per tablet 1-2 tablet  1-2 tablet Oral Q4H PRN Belkys A Regalado, MD      . morphine 2 MG/ML injection 1 mg  1 mg Intravenous Q3H PRN Alba CoryBelkys A Regalado, MD      .  ondansetron (ZOFRAN) tablet 4 mg  4 mg Oral Q6H PRN Belkys A Regalado, MD       Or  . ondansetron (ZOFRAN) injection 4 mg  4 mg Intravenous Q6H PRN Belkys A Regalado, MD      . pantoprazole (PROTONIX) injection 40 mg  40 mg Intravenous Q12H Belkys A Regalado, MD   40 mg at 01/21/14 1317    Allergies as of 01/21/2014 - Review Complete 01/21/2014  Allergen Reaction Noted  . Penicillins Itching 05/07/2011    Family History  Problem Relation Age of Onset  . Breast cancer Mother     History   Social History  . Marital Status: Married    Spouse Name: Evette    Number of Children: N/A  . Years of Education: N/A   Occupational History  . on disability.     prev worked for [employer], and at a [employer] plant- chemical exposure x 6 yrs   Social  History Main Topics  . Smoking status: Never Smoker   . Smokeless tobacco: Never Used  . Alcohol Use: No  . Drug Use: No  . Sexual Activity: Yes   Other Topics Concern  . Not on file   Social History Narrative  . No narrative on file    Review of Systems: Positive = bold Gen: Denies any fever, chills, rigors, night sweats, anorexia, fatigue, weakness, malaise, involuntary weight loss, and sleep disorder CV: Denies chest pain, angina, palpitations, syncope, orthopnea, PND, peripheral edema, and claudication. Resp: Denies dyspnea (chronic), cough, sputum, wheezing, coughing up blood. GI: Described in detail in HPI.    GU : Denies urinary burning, blood in urine, urinary frequency, urinary hesitancy, nocturnal urination, and urinary incontinence. MS: Denies joint pain or swelling.  Denies muscle weakness, cramps, atrophy.  Derm: Denies rash, itching, oral ulcerations, hives, unhealing ulcers.  Psych: Denies depression, anxiety, memory loss, suicidal ideation, hallucinations,  and confusion. Heme: Denies bruising, bleeding, and enlarged lymph nodes. Neuro:  Denies any headaches, dizziness, paresthesias. Endo:  Denies any problems with DM, thyroid, adrenal function.  Physical Exam: Vital signs in last 24 hours: Temp:  [98.6 F (37 C)-99.9 F (37.7 C)] 98.6 F (37 C) (06/27 1310) Pulse Rate:  [71-88] 88 (06/27 1351) Resp:  [18-24] 20 (06/27 1351) BP: (162-192)/(80-96) 190/80 mmHg (06/27 1351) SpO2:  [96 %-100 %] 98 % (06/27 1351) Weight:  [193.232 kg (426 lb)] 193.232 kg (426 lb) (06/27 0658) Last BM Date: 01/20/14 General:   Alert,  Well-developed, morbidly obese, no acute distress Head:  Normocephalic and atraumatic. Eyes:  Sclera clear, no icterus.   Conjunctiva pink. Ears:  Normal auditory acuity. Nose:  No deformity, discharge,  or lesions. Mouth:  No deformity or lesions.  Oropharynx pink & moist. Neck:  Short and very thick;  no obvious masses or thyromegaly. Lungs:   Clear throughout to auscultation, grossly, exam limited by size.   No obvious wheezes, crackles, or rhonchi. No acute distress. Heart:  Distant heart sounds due to patient's size; grossly normal, seemingly regular rate and rhythm; no murmurs, clicks, rubs,  or gallops. Abdomen:  Soft, markedly protuberant, mild epigastric tenderness without peritonitis.  Distant but ausculted bowel sounds. No masses, hepatosplenomegaly or hernias noted. Normal bowel sounds, without guarding, and without rebound.     Msk:  Symmetrical without gross deformities. Normal posture. Pulses:  Normal pulses noted. Extremities:  Without clubbing or edema. Neurologic:  Alert and  oriented x4;  Diffusely weak with limited mobility due to patient's size,  otherwise grossly normal neurologically. Skin:  Intact without significant lesions or rashes. Cervical Nodes:  No significant cervical adenopathy. Psych:  Alert and cooperative. Normal mood and affect.   Lab Results:  Recent Labs  01/21/14 0812  WBC 6.8  HGB 14.9  HCT 45.3  PLT 255   BMET  Recent Labs  01/21/14 0812  NA 134*  K 4.2  CL 98  CO2 23  GLUCOSE 151*  BUN 16  CREATININE 0.92  CALCIUM 9.1   LFT  Recent Labs  01/21/14 0812  PROT 8.2  ALBUMIN 3.5  AST 61*  ALT 40  ALKPHOS 83  BILITOT 1.0   PT/INR No results found for this basename: LABPROT, INR,  in the last 72 hours  Studies/Results: Dg Abd Acute W/chest  01/21/2014   CLINICAL DATA:  Nausea, abdominal pain, history of pancreatitis  EXAM: ACUTE ABDOMEN SERIES (ABDOMEN 2 VIEW & CHEST 1 VIEW)  COMPARISON:  01/08/2014, 01/05/2014.  FINDINGS: Cardiomediastinal silhouette is unremarkable. No acute infiltrate or pleural effusion. No pulmonary edema. There is nonspecific nonobstructive bowel gas pattern. Again noted postsurgical changes lumbar spine. Study is limited by patient's large body habitus. No free abdominal air.  IMPRESSION: Negative abdominal radiographs.  No acute cardiopulmonary  disease.   Electronically Signed   By: Natasha MeadLiviu  Pop M.D.   On: 01/21/2014 10:23    Impression:  1.  Acute recurrent pancreatitis.  Given recent elevation of liver tests, most consistent with biliary pancreatitis, despite lack of stone/sludge seen on recent imaging studies.  Medication effect and even autoimmune pancreatitis are other considerations, but are much less likely. 2.  Elevated LFTs, now near-normal. 3.  Morbid obesity with sleep apnea.  Plan:  1.  Medical management of pancreatitis over the weekend (intravenous fluids, clear liquid diet only, judicious analgesics). 2.  Will need more expedited endoscopic ultrasound for further evaluation (not MRCP candidate); test will likely need to be done next week while he's an inpatient (had been originally scheduled for September 2015). 3.  Follow serial lipase and LFTs. 4.  Will revisit Monday.   LOS: 0 days   [name],[name]  01/21/2014, 3:22 PM

## 2014-01-22 DIAGNOSIS — K859 Acute pancreatitis without necrosis or infection, unspecified: Secondary | ICD-10-CM | POA: Diagnosis not present

## 2014-01-22 LAB — COMPREHENSIVE METABOLIC PANEL
ALBUMIN: 3 g/dL — AB (ref 3.5–5.2)
ALT: 195 U/L — ABNORMAL HIGH (ref 0–53)
AST: 181 U/L — ABNORMAL HIGH (ref 0–37)
Alkaline Phosphatase: 120 U/L — ABNORMAL HIGH (ref 39–117)
BUN: 10 mg/dL (ref 6–23)
CO2: 24 mEq/L (ref 19–32)
CREATININE: 0.88 mg/dL (ref 0.50–1.35)
Calcium: 8.8 mg/dL (ref 8.4–10.5)
Chloride: 102 mEq/L (ref 96–112)
GFR calc non Af Amer: 87 mL/min — ABNORMAL LOW (ref >90)
GLUCOSE: 119 mg/dL — AB (ref 70–99)
Potassium: 3.9 mEq/L (ref 3.7–5.3)
Sodium: 138 mEq/L (ref 137–147)
TOTAL PROTEIN: 6.9 g/dL (ref 6.0–8.3)
Total Bilirubin: 3.1 mg/dL — ABNORMAL HIGH (ref 0.3–1.2)

## 2014-01-22 LAB — CBC
HEMATOCRIT: 42.7 % (ref 39.0–52.0)
HEMOGLOBIN: 14.2 g/dL (ref 13.0–17.0)
MCH: 28.9 pg (ref 26.0–34.0)
MCHC: 33.3 g/dL (ref 30.0–36.0)
MCV: 86.8 fL (ref 78.0–100.0)
Platelets: 247 10*3/uL (ref 150–400)
RBC: 4.92 MIL/uL (ref 4.22–5.81)
RDW: 13.5 % (ref 11.5–15.5)
WBC: 8.7 10*3/uL (ref 4.0–10.5)

## 2014-01-22 LAB — LIPASE, BLOOD: LIPASE: 217 U/L — AB (ref 11–59)

## 2014-01-22 NOTE — Progress Notes (Signed)
Subjective: Some worsening pain overnight. Some nausea and vomiting overnight.  Objective: Vital signs in last 24 hours: Temp:  [98.6 F (37 C)-98.9 F (37.2 C)] 98.9 F (37.2 C) (06/28 16100638) Pulse Rate:  [59-88] 59 (06/28 0638) Resp:  [20-24] 20 (06/28 0638) BP: (152-192)/(66-96) 159/66 mmHg (06/28 0638) SpO2:  [96 %-100 %] 100 % (06/28 [id number]) Weight:  [192.96 kg (425 lb 6.4 oz)] 192.96 kg (425 lb 6.4 oz) (06/28 [id number]) Weight change: -0.272 kg (-9.6 oz) Last BM Date: 01/20/14  PE: GEN:  Overweight, NAD ABD:  Protuberant, soft, mild diffuse tenderness (worse periumbilical and epigastric), hypoactive bowel sounds  Lab Results: CBC    Component Value Date/Time   WBC 8.7 01/22/2014 0540   RBC 4.92 01/22/2014 0540   HGB 14.2 01/22/2014 0540   HCT 42.7 01/22/2014 0540   PLT 247 01/22/2014 0540   MCV 86.8 01/22/2014 0540   MCH 28.9 01/22/2014 0540   MCHC 33.3 01/22/2014 0540   RDW 13.5 01/22/2014 0540   LYMPHSABS 1.3 01/21/2014 0812   MONOABS 0.4 01/21/2014 0812   EOSABS 0.0 01/21/2014 0812   BASOSABS 0.0 01/21/2014 0812   CMP     Component Value Date/Time   NA 138 01/22/2014 0540   K 3.9 01/22/2014 0540   CL 102 01/22/2014 0540   CO2 24 01/22/2014 0540   GLUCOSE 119* 01/22/2014 0540   BUN 10 01/22/2014 0540   CREATININE 0.88 01/22/2014 0540   CALCIUM 8.8 01/22/2014 0540   PROT 6.9 01/22/2014 0540   ALBUMIN 3.0* 01/22/2014 0540   AST 181* 01/22/2014 0540   ALT 195* 01/22/2014 0540   ALKPHOS 120* 01/22/2014 0540   BILITOT 3.1* 01/22/2014 0540   GFRNONAA 87* 01/22/2014 0540   GFRAA >90 01/22/2014 0540   Assessment:  1.  Acute recurrent pancreatitis.  Suspect gallstone-pancreatitis, despite negative imaging studies. 2.  Elevated LFTs, interval worsening over the past 24 hours.  Suspect ball-valving type choledocholithiasis.  Plan:  1.  Clear liquids only. 2.  Intravenous fluids and judicious analgesics. 3.  Will need endoscopic ultrasound +/- early next week.    Freddy JakschUTLAW,WILLIAM  M 01/22/2014, 10:52 AM

## 2014-01-22 NOTE — Progress Notes (Signed)
Pt is on home CPAP for tonight. Places self on. RT will continue to monitor.

## 2014-01-22 NOTE — Progress Notes (Signed)
[facility] PROGRESS NOTE  Richard HawkLarry Sandoval AVW:[id number] DOB: [birth year] DOA: 01/21/2014 PCP: Richard BradfordWHITE,[name], MD  Assessment/Plan: 1-Acute Pancreatitis; patient presents with recurrent abdominal pain, lipase at 663. Mild increase AST.  On clear diet, IV fluids, IV pain medications, IV protonix. GI consulted.  LFT increasing. Bilirubin increased. Plan for endoscopy US during this admission.  Lipase decrease to 217.  2-OSA; CPAP PRN.   3-Depression; continue with current medications.   Code Status: Full Code.  Family Communication: care discussed with patient.  Disposition Plan: remain inpatient.    Consultants:  GI, Dr Collins Scotlandut Law.   Procedures:  none  Antibiotics:  none  HPI/Subjective: He rates pain 8/10, pain medications helps. No vomiting today.   Objective: Filed Vitals:   01/22/14 1500  BP: 155/79  Pulse: 64  Temp: 98.3 F (36.8 C)  Resp: 20    Intake/Output Summary (Last 24 hours) at 01/22/14 1516 Last data filed at 01/22/14 1131  Gross per 24 hour  Intake 2661.25 ml  Output   2550 ml  Net 111.25 ml   Filed Weights   01/21/14 0658 01/22/14 0638  Weight: 193.232 kg (426 lb) 192.96 kg (425 lb 6.4 oz)    Exam:   General:  Alert, in no distress.   Cardiovascular: Sandoval 1, Sandoval 2 RRR  Respiratory: CTA  Abdomen: Obese, distended, mild epigastric tenderness.   Musculoskeletal: no edema.   Data Reviewed: Basic Metabolic Panel:  Recent Labs Lab 01/21/14 0812 01/22/14 0540  NA 134* 138  K 4.2 3.9  CL 98 102  CO2 23 24  GLUCOSE 151* 119*  BUN 16 10  CREATININE 0.92 0.88  CALCIUM 9.1 8.8   Liver Function Tests:  Recent Labs Lab 01/21/14 0812 01/22/14 0540  AST 61* 181*  ALT 40 195*  ALKPHOS 83 120*  BILITOT 1.0 3.1*  PROT 8.2 6.9  ALBUMIN 3.5 3.0*    Recent Labs Lab 01/21/14 0812 01/22/14 0540  LIPASE 663* 217*   No results found for this basename: AMMONIA,  in the last 168 hours CBC:  Recent Labs Lab 01/21/14 0812  01/22/14 0540  WBC 6.8 8.7  NEUTROABS 5.2  --   HGB 14.9 14.2  HCT 45.3 42.7  MCV 87.1 86.8  PLT 255 247   Cardiac Enzymes: No results found for this basename: CKTOTAL, CKMB, CKMBINDEX, TROPONINI,  in the last 168 hours BNP (last 3 results)  Recent Labs  01/05/14 1350  PROBNP 209.9*   CBG: No results found for this basename: GLUCAP,  in the last 168 hours  Recent Results (from the past 240 hour(Sandoval))  MRSA PCR SCREENING     Status: None   Collection Time    01/21/14  2:27 PM      Result Value Ref Range Status   MRSA by PCR NEGATIVE  NEGATIVE Final   Comment:            The GeneXpert MRSA Assay (FDA     approved for NASAL specimens     only), is one component of a     comprehensive MRSA colonization     surveillance program. It is not     intended to diagnose MRSA     infection nor to guide or     monitor treatment for     MRSA infections.     Studies: Dg Abd Acute W/chest  01/21/2014   CLINICAL DATA:  Nausea, abdominal pain, history of pancreatitis  EXAM: ACUTE ABDOMEN SERIES (ABDOMEN 2 VIEW & CHEST 1  VIEW)  COMPARISON:  01/08/2014, 01/05/2014.  FINDINGS: Cardiomediastinal silhouette is unremarkable. No acute infiltrate or pleural effusion. No pulmonary edema. There is nonspecific nonobstructive bowel gas pattern. Again noted postsurgical changes lumbar spine. Study is limited by patient'Sandoval large body habitus. No free abdominal air.  IMPRESSION: Negative abdominal radiographs.  No acute cardiopulmonary disease.   Electronically Signed   By: Natasha MeadLiviu  Pop M.D.   On: 01/21/2014 10:23    Scheduled Meds: . aspirin  81 mg Oral Daily  . docusate sodium  100 mg Oral BID  . enoxaparin (LOVENOX) injection  100 mg Subcutaneous Q24H  . FLUoxetine  20 mg Oral Daily  . pantoprazole (PROTONIX) IV  40 mg Intravenous Q12H   Continuous Infusions: . sodium chloride 1,000 mL (01/21/14 1134)  . sodium chloride 125 mL/hr at 01/22/14 0008    Principal Problem:   Pancreatitis Active  Problems:   OSA (obstructive sleep apnea)   Abnormal transaminases   Morbid obesity    Time spent: 30 minutes.     [name], Belkys A  [facility] Pager [phone]. If 7PM-7AM, please contact night-coverage at [email or web address], password [email or web address] 01/22/2014, 3:16 PM  LOS: 1 day

## 2014-01-23 DIAGNOSIS — K859 Acute pancreatitis without necrosis or infection, unspecified: Secondary | ICD-10-CM | POA: Diagnosis not present

## 2014-01-23 DIAGNOSIS — R7401 Elevation of levels of liver transaminase levels: Secondary | ICD-10-CM | POA: Diagnosis not present

## 2014-01-23 LAB — CBC
HCT: 42 % (ref 39.0–52.0)
Hemoglobin: 13.7 g/dL (ref 13.0–17.0)
MCH: 28.7 pg (ref 26.0–34.0)
MCHC: 32.6 g/dL (ref 30.0–36.0)
MCV: 87.9 fL (ref 78.0–100.0)
Platelets: 234 10*3/uL (ref 150–400)
RBC: 4.78 MIL/uL (ref 4.22–5.81)
RDW: 13.8 % (ref 11.5–15.5)
WBC: 9.8 10*3/uL (ref 4.0–10.5)

## 2014-01-23 LAB — COMPREHENSIVE METABOLIC PANEL
ALBUMIN: 3 g/dL — AB (ref 3.5–5.2)
ALK PHOS: 109 U/L (ref 39–117)
ALT: 134 U/L — ABNORMAL HIGH (ref 0–53)
AST: 84 U/L — AB (ref 0–37)
BUN: 10 mg/dL (ref 6–23)
CHLORIDE: 102 meq/L (ref 96–112)
CO2: 26 meq/L (ref 19–32)
CREATININE: 0.95 mg/dL (ref 0.50–1.35)
Calcium: 8.7 mg/dL (ref 8.4–10.5)
GFR calc Af Amer: >90 mL/min (ref >90)
GFR calc non Af Amer: 84 mL/min — ABNORMAL LOW (ref >90)
Glucose, Bld: 91 mg/dL (ref 70–99)
POTASSIUM: 3.9 meq/L (ref 3.7–5.3)
SODIUM: 138 meq/L (ref 137–147)
Total Bilirubin: 1.8 mg/dL — ABNORMAL HIGH (ref 0.3–1.2)
Total Protein: 6.7 g/dL (ref 6.0–8.3)

## 2014-01-23 LAB — LIPASE, BLOOD: Lipase: 20 U/L (ref 11–59)

## 2014-01-23 MED ORDER — SODIUM CHLORIDE 0.9 % IV SOLN
1000.0000 mL | INTRAVENOUS | Status: DC
  Administered 2014-01-24: 1000 mL via INTRAVENOUS

## 2014-01-23 NOTE — Progress Notes (Signed)
[facility] PROGRESS NOTE  Leomar Rowland MRN:[id number] DOB: 02/05/1946 DOA: 01/21/2014 PCP: WHITE,[name], MD  Assessment/Plan: 68 year old morbidly obese, OSA on CPAP, history of pancreatitis that might be related to biliary source, last admitted to the hospital on 6-11 with Pancreatitis. He was not able to have MRCP because of metal "titanium" rods in his back. US abdomen was limited due to body habitus. Plan was to have US endoscopy as out patient by Dr Schooler on September. Patient was readmitted to the hospital on 6-27 with recurrent pancreatitis. Lipase was at 660, increased  liver function test. He has been treated with support care, IV fluids, IV pain medications, IV Protonix. GI was consulted. Plan is to proceed with endoscopic ultrasound on 6-30.   1-Acute Pancreatitis; patient presents with recurrent abdominal pain, lipase at 663. LFT fluctuates. LFT has decrease today.  Continue with clear diet, IV fluids, IV pain medications, IV protonix.  Appreciate GI evaluation, recommendation.  Plan for endoscopy US during this admission.  Lipase trend 663-217--20.   2-OSA; CPAP PRN.   3-Depression; continue with current medications.  4-DVT prophylaxis; Lovenox.   Code Status: Full Code.  Family Communication: care discussed with patient.  Disposition Plan: remain inpatient.    Consultants:  GI, Dr Out Law.   Procedures:  none  Antibiotics:  none  HPI/Subjective: He rates pain is better this morning. He had hard night. He was having abdominal pain.   Objective: Filed Vitals:   01/23/14 1412  BP: 178/88  Pulse: 54  Temp: 97.8 F (36.6 C)  Resp: 18    Intake/Output Summary (Last 24 hours) at 01/23/14 1627 Last data filed at 01/23/14 1409  Gross per 24 hour  Intake   4315 ml  Output   1750 ml  Net   2565 ml   Filed Weights   01/21/14 0658 01/22/14 0638 01/23/14 0440  Weight: 193.232 kg (426 lb) 192.96 kg (425 lb 6.4 oz) 196.408 kg (433 lb)     Exam:   General:  Alert, in no distress.   Cardiovascular: S 1, S 2 RRR  Respiratory: CTA  Abdomen: Obese, distended, mild epigastric tenderness.   Musculoskeletal: no edema.   Data Reviewed: Basic Metabolic Panel:  Recent Labs Lab 01/21/14 0812 01/22/14 0540 01/23/14 0459  NA 134* 138 138  K 4.2 3.9 3.9  CL 98 102 102  CO2 23 24 26  GLUCOSE 151* 119* 91  BUN 16 10 10  CREATININE 0.92 0.88 0.95  CALCIUM 9.1 8.8 8.7   Liver Function Tests:  Recent Labs Lab 01/21/14 0812 01/22/14 0540 01/23/14 0459  AST 61* 181* 84*  ALT 40 195* 134*  ALKPHOS 83 120* 109  BILITOT 1.0 3.1* 1.8*  PROT 8.2 6.9 6.7  ALBUMIN 3.5 3.0* 3.0*    Recent Labs Lab 01/21/14 0812 01/22/14 0540 01/23/14 0459  LIPASE 663* 217* 20   No results found for this basename: AMMONIA,  in the last 168 hours CBC:  Recent Labs Lab 01/21/14 0812 01/22/14 0540 01/23/14 0459  WBC 6.8 8.7 9.8  NEUTROABS 5.2  --   --   HGB 14.9 14.2 13.7  HCT 45.3 42.7 42.0  MCV 87.1 86.8 87.9  PLT 255 247 234   Cardiac Enzymes: No results found for this basename: CKTOTAL, CKMB, CKMBINDEX, TROPONINI,  in the last 168 hours BNP (last 3 results)  Recent Labs  01/05/14 1350  PROBNP 209.9*   CBG: No results found for this basename: GLUCAP,  in the last   168 hours  Recent Results (from the past 240 hour(s))  MRSA PCR SCREENING     Status: None   Collection Time    01/21/14  2:27 PM      Result Value Ref Range Status   MRSA by PCR NEGATIVE  NEGATIVE Final   Comment:            The GeneXpert MRSA Assay (FDA     approved for NASAL specimens     only), is one component of a     comprehensive MRSA colonization     surveillance program. It is not     intended to diagnose MRSA     infection nor to guide or     monitor treatment for     MRSA infections.     Studies: No results found.  Scheduled Meds: . aspirin  81 mg Oral Daily  . docusate sodium  100 mg Oral BID  . enoxaparin (LOVENOX)  injection  100 mg Subcutaneous Q24H  . FLUoxetine  20 mg Oral Daily  . pantoprazole (PROTONIX) IV  40 mg Intravenous Q12H   Continuous Infusions: . sodium chloride 1,000 mL (01/21/14 1134)  . sodium chloride 1,000 mL (01/23/14 1426)    Principal Problem:   Pancreatitis Active Problems:   OSA (obstructive sleep apnea)   Abnormal transaminases   Morbid obesity    Time spent: 30 minutes.     [name], Belkys A  [facility] Pager [phone]. If 7PM-7AM, please contact night-coverage at [email or web address], password [email or web address] 01/23/2014, 4:27 PM  LOS: 2 days

## 2014-01-23 NOTE — Progress Notes (Signed)
[facility] PROGRESS NOTE Subjective Some better but rough night, with continued pain  Objective: Vital signs in last 24 hours: Temp:  [96.9 F (36.1 C)-98.3 F (36.8 C)] 96.9 F (36.1 C) (06/29 0440) Pulse Rate:  [64-86] 66 (06/29 0440) Resp:  [18-20] 20 (06/29 0440) BP: (152-157)/(71-79) 152/74 mmHg (06/29 0440) SpO2:  [99 %-100 %] 99 % (06/29 0440) Weight:  [196.408 kg (433 lb)] 196.408 kg (433 lb) (06/29 0440) Last BM Date: 01/20/14  Intake/Output from previous day: 06/28 0701 - 06/29 0700 In: 3671.3 [P.O.:540; I.V.:3131.3] Out: 2000 [Urine:2000] Intake/Output this shift:    PE: General--obese BM with CPAP Heart-- Lungs--clear Abdomen--obese mild tenderness few BSs  Lab Results:  Recent Labs  01/21/14 0812 01/22/14 0540 01/23/14 0459  WBC 6.8 8.7 9.8  HGB 14.9 14.2 13.7  HCT 45.3 42.7 42.0  PLT 255 247 234   BMET  Recent Labs  01/21/14 0812 01/22/14 0540 01/23/14 0459  NA 134* 138 138  K 4.2 3.9 3.9  CL 98 102 102  CO2 23 24 26   CREATININE 0.92 0.88 0.95   LFT  Recent Labs  01/21/14 0812 01/22/14 0540 01/23/14 0459  PROT 8.2 6.9 6.7  AST 61* 181* 84*  ALT 40 195* 134*  ALKPHOS 83 120* 109  BILITOT 1.0 3.1* 1.8*   PT/INR No results found for this basename: LABPROT, INR,  in the last 72 hours PANCREAS  Recent Labs  01/21/14 0812 01/22/14 0540 01/23/14 0459  LIPASE 663* 217* 20         Studies/Results: Dg Abd Acute W/chest  01/21/2014   CLINICAL DATA:  Nausea, abdominal pain, history of pancreatitis  EXAM: ACUTE ABDOMEN SERIES (ABDOMEN 2 VIEW & CHEST 1 VIEW)  COMPARISON:  01/08/2014, 01/05/2014.  FINDINGS: Cardiomediastinal silhouette is unremarkable. No acute infiltrate or pleural effusion. No pulmonary edema. There is nonspecific nonobstructive bowel gas pattern. Again noted postsurgical changes lumbar spine. Study is limited by patient's large body habitus. No free abdominal air.  IMPRESSION: Negative abdominal  radiographs.  No acute cardiopulmonary disease.   Electronically Signed   By: Natasha MeadLiviu  Pop M.D.   On: 01/21/2014 10:23    Medications: I have reviewed the patient's current medications.  Assessment/Plan: 1. Pancreatitis. Etiology ?[name]. No GSs on US but acts like GS pancreatitis, ? If passed stone last night. Discussed EUS, ERCP, and possible lap chole with pt. Will plan EUS at [address] tomorrow to evaluate CBD and GB for stones. Results will determine further course.   EDWARDS JR,JAMES L 01/23/2014, 8:31 AM

## 2014-01-23 NOTE — Progress Notes (Signed)
Pt states he does not need any assistance with CPAP or nasal mask application. CPAP is set on auto titrate min-5 max-20cmH2O. No sterile water was added per pt request. RT made pt aware if he should need any assistance during the night to contact RT.

## 2014-01-24 ENCOUNTER — Encounter (HOSPITAL_COMMUNITY)
Admission: EM | Disposition: A | Discharge status: Home / Self Care | Payer: Self-pay | Source: Home / Self Care | Attending: Internal Medicine

## 2014-01-24 ENCOUNTER — Inpatient Hospital Stay (HOSPITAL_COMMUNITY): Payer: Managed Care, Other (non HMO) | Admitting: Anesthesiology

## 2014-01-24 ENCOUNTER — Encounter (HOSPITAL_COMMUNITY): Payer: Self-pay | Admitting: *Deleted

## 2014-01-24 ENCOUNTER — Encounter (HOSPITAL_COMMUNITY): Payer: Managed Care, Other (non HMO) | Admitting: Anesthesiology

## 2014-01-24 DIAGNOSIS — K8010 Calculus of gallbladder with chronic cholecystitis without obstruction: Secondary | ICD-10-CM

## 2014-01-24 DIAGNOSIS — K838 Other specified diseases of biliary tract: Secondary | ICD-10-CM | POA: Diagnosis not present

## 2014-01-24 DIAGNOSIS — K828 Other specified diseases of gallbladder: Secondary | ICD-10-CM | POA: Diagnosis not present

## 2014-01-24 DIAGNOSIS — R7401 Elevation of levels of liver transaminase levels: Secondary | ICD-10-CM | POA: Diagnosis not present

## 2014-01-24 DIAGNOSIS — K861 Other chronic pancreatitis: Secondary | ICD-10-CM

## 2014-01-24 DIAGNOSIS — K859 Acute pancreatitis without necrosis or infection, unspecified: Secondary | ICD-10-CM | POA: Diagnosis not present

## 2014-01-24 HISTORY — PX: EUS: SHX5427

## 2014-01-24 LAB — COMPREHENSIVE METABOLIC PANEL
ALBUMIN: 3 g/dL — AB (ref 3.5–5.2)
ALT: 98 U/L — AB (ref 0–53)
AST: 51 U/L — ABNORMAL HIGH (ref 0–37)
Alkaline Phosphatase: 98 U/L (ref 39–117)
BUN: 8 mg/dL (ref 6–23)
CO2: 24 mEq/L (ref 19–32)
Calcium: 8.7 mg/dL (ref 8.4–10.5)
Chloride: 100 mEq/L (ref 96–112)
Creatinine, Ser: 0.89 mg/dL (ref 0.50–1.35)
GFR calc non Af Amer: 87 mL/min — ABNORMAL LOW (ref >90)
Glucose, Bld: 96 mg/dL (ref 70–99)
Potassium: 3.3 mEq/L — ABNORMAL LOW (ref 3.7–5.3)
SODIUM: 136 meq/L — AB (ref 137–147)
TOTAL PROTEIN: 7 g/dL (ref 6.0–8.3)
Total Bilirubin: 1.5 mg/dL — ABNORMAL HIGH (ref 0.3–1.2)

## 2014-01-24 LAB — CBC
HCT: 42.4 % (ref 39.0–52.0)
HEMOGLOBIN: 13.8 g/dL (ref 13.0–17.0)
MCH: 28.5 pg (ref 26.0–34.0)
MCHC: 32.5 g/dL (ref 30.0–36.0)
MCV: 87.6 fL (ref 78.0–100.0)
Platelets: 243 10*3/uL (ref 150–400)
RBC: 4.84 MIL/uL (ref 4.22–5.81)
RDW: 13.7 % (ref 11.5–15.5)
WBC: 7.5 10*3/uL (ref 4.0–10.5)

## 2014-01-24 LAB — LIPASE, BLOOD: Lipase: 18 U/L (ref 11–59)

## 2014-01-24 SURGERY — ESOPHAGEAL ENDOSCOPIC ULTRASOUND (EUS) RADIAL
Anesthesia: Monitor Anesthesia Care

## 2014-01-24 SURGERY — UPPER ENDOSCOPIC ULTRASOUND (EUS) RADIAL
Anesthesia: Monitor Anesthesia Care

## 2014-01-24 MED ORDER — LACTATED RINGERS IV SOLN
INTRAVENOUS | Status: DC | PRN
  Administered 2014-01-24: 12:00:00 via INTRAVENOUS

## 2014-01-24 MED ORDER — SODIUM CHLORIDE 0.9 % IV SOLN: INTRAVENOUS | Status: DC

## 2014-01-24 MED ORDER — MIDAZOLAM HCL 5 MG/5ML IJ SOLN
INTRAMUSCULAR | Status: DC | PRN
  Administered 2014-01-24 (×2): 1 mg via INTRAVENOUS

## 2014-01-24 MED ORDER — BUTAMBEN-TETRACAINE-BENZOCAINE 2-2-14 % EX AERO
INHALATION_SPRAY | CUTANEOUS | Status: DC | PRN
Start: 2014-01-24 — End: 2014-01-24
  Administered 2014-01-24: 2 via TOPICAL

## 2014-01-24 MED ORDER — PROPOFOL INFUSION 10 MG/ML OPTIME
INTRAVENOUS | Status: DC | PRN
  Administered 2014-01-24: 180 ug/kg/min via INTRAVENOUS

## 2014-01-24 MED ORDER — LIDOCAINE HCL 1 % IJ SOLN
INTRAMUSCULAR | Status: DC | PRN
  Administered 2014-01-24: 50 mg via INTRADERMAL

## 2014-01-24 MED ORDER — POTASSIUM CHLORIDE 10 MEQ/100ML IV SOLN
10.0000 meq | INTRAVENOUS | Status: AC
  Administered 2014-01-24 (×4): 10 meq via INTRAVENOUS
  Filled 2014-01-24 (×3): qty 100

## 2014-01-24 MED ORDER — LACTATED RINGERS IV SOLN
INTRAVENOUS | Status: DC
  Administered 2014-01-24: 1000 mL via INTRAVENOUS

## 2014-01-24 NOTE — Transfer of Care (Signed)
Immediate Anesthesia Transfer of Care Note  Patient: Richard Sandoval  Procedure(s) Performed: Procedure(s): UPPER ENDOSCOPIC ULTRASOUND (EUS) RADIAL (N/A)  Patient Location: PACU and Endoscopy Unit  Anesthesia Type:MAC  Level of Consciousness: awake and oriented  Airway & Oxygen Therapy: Patient Spontanous Breathing and Patient connected to nasal cannula oxygen  Post-op Assessment: Report given to PACU RN and Post -op Vital signs reviewed and stable  Post vital signs: Reviewed and stable  Complications: No apparent anesthesia complications

## 2014-01-24 NOTE — Progress Notes (Signed)
[facility] PROGRESS NOTE  Richard HawkLarry Sandoval [id number] DOB: 1945/08/06 DOA: 01/21/2014 PCP: Cala BradfordWHITE,[name], MD  Assessment/Plan: 68 year old morbidly obese, OSA on CPAP, history of pancreatitis that might be related to biliary source, last admitted to the hospital on 6-11 with Pancreatitis. He was not able to have MRCP because of metal "titanium" rods in his back. US abdomen was limited due to body habitus. Plan was to have US endoscopy as out patient by Dr Bosie ClosSchooler on September. Patient was readmitted to the hospital on 6-27 with recurrent pancreatitis. Lipase was at 660, increased  liver function test. He has been treated with support care, IV fluids, IV pain medications, IV Protonix. GI was consulted. Patient underwent EUS: which show gallbladder sludge. Surgery was consulted for evaluation for cholecystectomy.   1-Acute Pancreatitis; Patient presents with recurrent abdominal pain, lipase at 663. LFT fluctuates. LFT trending down.  -Continue with clear diet, IV fluids, IV pain medications, IV protonix.  -Appreciate GI evaluation, recommendation.  -Patient underwent Endoscopy US 6-30: Gallbladder wall diffusely mildly thickened. Large amount of dependent sludge seen within the gallbladder,Bile duct non-dilated but had symmetrical diffuse wall thickening, likely reactive-appearing.  -Lipase trend 663-217--20.  -Surgery consulted for evaluation for cholecystectomy.  -Pancreatitis probably related to gallbladder sludge.   2-OSA; CPAP PRN.   3-Depression; continue with current medications.   4-DVT prophylaxis; Lovenox.  5-Hypokalemia; Replete IV.   Code Status: Full Code.  Family Communication: care discussed with patient and wife.  Disposition Plan: remain inpatient.    Consultants:  GI, Dr Collins Scotlandut Law.   Procedures: EUS: Diffusely heterogeneous pancreas without focal mass.  Mild pancreatic inflammatory changes. Couple triangular  reactive-appearing periportal lymph nodes.  Gallbladder wall  diffusely mildly thickened. Large amount of dependent sludge seen  within the gallbladder. Bile duct non-dilated but had symmetrical  diffuse wall thickening, likely reactive-appearing. No  choledocholithiasis identified. Ampulla normal via EUS   Antibiotics:  none  HPI/Subjective: He denies chest pain or dyspnea. He is feeling better. He denies abdominal pain.   Objective: Filed Vitals:   01/24/14 0610  BP: 158/81  Pulse: 64  Temp: 98.2 F (36.8 C)  Resp: 20    Intake/Output Summary (Last 24 hours) at 01/24/14 1034 Last data filed at 01/24/14 [id number]  Gross per 24 hour  Intake 2311.25 ml  Output   2925 ml  Net -613.75 ml   Filed Weights   01/22/14 0638 01/23/14 0440 01/24/14 0610  Weight: 192.96 kg (425 lb 6.4 oz) 196.408 kg (433 lb) 193.777 kg (427 lb 3.2 oz)    Exam:   General:  Morbid Obese. Alert, in no distress.   Cardiovascular: S 1, S 2 RRR  Respiratory: CTA  Abdomen: Obese, distended, mild epigastric tenderness.   Musculoskeletal: mild edema, chronic hyperpigmentation, thick skin.   Data Reviewed: Basic Metabolic Panel:  Recent Labs Lab 01/21/14 0812 01/22/14 0540 01/23/14 0459 01/24/14 0446  NA 134* 138 138 136*  K 4.2 3.9 3.9 3.3*  CL 98 102 102 100  CO2 23 24 26 24   GLUCOSE 151* 119* 91 96  BUN 16 10 10 8   CREATININE 0.92 0.88 0.95 0.89  CALCIUM 9.1 8.8 8.7 8.7   Liver Function Tests:  Recent Labs Lab 01/21/14 0812 01/22/14 0540 01/23/14 0459 01/24/14 0446  AST 61* 181* 84* 51*  ALT 40 195* 134* 98*  ALKPHOS 83 120* 109 98  BILITOT 1.0 3.1* 1.8* 1.5*  PROT 8.2 6.9 6.7 7.0  ALBUMIN 3.5 3.0* 3.0* 3.0*  Recent Labs Lab 01/21/14 0812 01/22/14 0540 01/23/14 0459 01/24/14 0446  LIPASE 663* 217* 20 18   No results found for this basename: AMMONIA,  in the last 168 hours CBC:  Recent Labs Lab 01/21/14 0812 01/22/14 0540 01/23/14 0459 01/24/14 0446  WBC 6.8 8.7 9.8 7.5  NEUTROABS 5.2  --   --   --    HGB 14.9 14.2 13.7 13.8  HCT 45.3 42.7 42.0 42.4  MCV 87.1 86.8 87.9 87.6  PLT 255 247 234 243   Cardiac Enzymes: No results found for this basename: CKTOTAL, CKMB, CKMBINDEX, TROPONINI,  in the last 168 hours BNP (last 3 results)  Recent Labs  01/05/14 1350  PROBNP 209.9*   CBG: No results found for this basename: GLUCAP,  in the last 168 hours  Recent Results (from the past 240 hour(s))  MRSA PCR SCREENING     Status: None   Collection Time    01/21/14  2:27 PM      Result Value Ref Range Status   MRSA by PCR NEGATIVE  NEGATIVE Final   Comment:            The GeneXpert MRSA Assay (FDA     approved for NASAL specimens     only), is one component of a     comprehensive MRSA colonization     surveillance program. It is not     intended to diagnose MRSA     infection nor to guide or     monitor treatment for     MRSA infections.     Studies: No results found.  Scheduled Meds: . aspirin  81 mg Oral Daily  . docusate sodium  100 mg Oral BID  . enoxaparin (LOVENOX) injection  100 mg Subcutaneous Q24H  . FLUoxetine  20 mg Oral Daily  . pantoprazole (PROTONIX) IV  40 mg Intravenous Q12H  . potassium chloride  10 mEq Intravenous Q1 Hr x 3   Continuous Infusions: . sodium chloride 1,000 mL (01/23/14 1426)  . sodium chloride 1,000 mL (01/24/14 0049)    Principal Problem:   Pancreatitis Active Problems:   OSA (obstructive sleep apnea)   Abnormal transaminases   Morbid obesity    Time spent: 30 minutes.     [name], Belkys A  [facility] Pager [phone]. If 7PM-7AM, please contact night-coverage at [email or web address], password [email or web address] 01/24/2014, 10:34 AM  LOS: 3 days

## 2014-01-24 NOTE — Transfer of Care (Signed)
Immediate Anesthesia Transfer of Care Note  Patient: Richard HawkLarry Sandoval  Procedure(s) Performed: Procedure(s): UPPER ENDOSCOPIC ULTRASOUND (EUS) RADIAL (N/A)  Patient Location: PACU and Endoscopy Unit  Anesthesia Type:MAC  Level of Consciousness: awake, alert  and oriented  Airway & Oxygen Therapy: Patient Spontanous Breathing and Patient connected to nasal cannula oxygen  Post-op Assessment: Report given to PACU RN, Post -op Vital signs reviewed and stable and Patient moving all extremities  Post vital signs: Reviewed and stable  Complications: No apparent anesthesia complications

## 2014-01-24 NOTE — H&P (View-Only) (Signed)
[facility] PROGRESS NOTE  Richard Sandoval ZOX:[id number] DOB: 1945-12-21 DOA: 01/21/2014 PCP: Cala BradfordWHITE,[name], MD  Assessment/Plan: 68 year old morbidly obese, OSA on CPAP, history of pancreatitis that might be related to biliary source, last admitted to the hospital on 6-11 with Pancreatitis. He was not able to have MRCP because of metal "titanium" rods in his back. US abdomen was limited due to body habitus. Plan was to have US endoscopy as out patient by Dr Bosie ClosSchooler on September. Patient was readmitted to the hospital on 6-27 with recurrent pancreatitis. Lipase was at 660, increased  liver function test. He has been treated with support care, IV fluids, IV pain medications, IV Protonix. GI was consulted. Plan is to proceed with endoscopic ultrasound on 6-30.   1-Acute Pancreatitis; patient presents with recurrent abdominal pain, lipase at 663. LFT fluctuates. LFT has decrease today.  Continue with clear diet, IV fluids, IV pain medications, IV protonix.  Appreciate GI evaluation, recommendation.  Plan for endoscopy US during this admission.  Lipase trend 663-217--20.   2-OSA; CPAP PRN.   3-Depression; continue with current medications.  4-DVT prophylaxis; Lovenox.   Code Status: Full Code.  Family Communication: care discussed with patient.  Disposition Plan: remain inpatient.    Consultants:  GI, Dr Collins Scotlandut Law.   Procedures:  none  Antibiotics:  none  HPI/Subjective: He rates pain is better this morning. He had hard night. He was having abdominal pain.   Objective: Filed Vitals:   01/23/14 1412  BP: 178/88  Pulse: 54  Temp: 97.8 F (36.6 C)  Resp: 18    Intake/Output Summary (Last 24 hours) at 01/23/14 1627 Last data filed at 01/23/14 1409  Gross per 24 hour  Intake   4315 ml  Output   1750 ml  Net   2565 ml   Filed Weights   01/21/14 0658 01/22/14 0638 01/23/14 0440  Weight: 193.232 kg (426 lb) 192.96 kg (425 lb 6.4 oz) 196.408 kg (433 lb)     Exam:   General:  Alert, in no distress.   Cardiovascular: S 1, S 2 RRR  Respiratory: CTA  Abdomen: Obese, distended, mild epigastric tenderness.   Musculoskeletal: no edema.   Data Reviewed: Basic Metabolic Panel:  Recent Labs Lab 01/21/14 0812 01/22/14 0540 01/23/14 0459  NA 134* 138 138  K 4.2 3.9 3.9  CL 98 102 102  CO2 23 24 26   GLUCOSE 151* 119* 91  BUN 16 10 10   CREATININE 0.92 0.88 0.95  CALCIUM 9.1 8.8 8.7   Liver Function Tests:  Recent Labs Lab 01/21/14 0812 01/22/14 0540 01/23/14 0459  AST 61* 181* 84*  ALT 40 195* 134*  ALKPHOS 83 120* 109  BILITOT 1.0 3.1* 1.8*  PROT 8.2 6.9 6.7  ALBUMIN 3.5 3.0* 3.0*    Recent Labs Lab 01/21/14 0812 01/22/14 0540 01/23/14 0459  LIPASE 663* 217* 20   No results found for this basename: AMMONIA,  in the last 168 hours CBC:  Recent Labs Lab 01/21/14 0812 01/22/14 0540 01/23/14 0459  WBC 6.8 8.7 9.8  NEUTROABS 5.2  --   --   HGB 14.9 14.2 13.7  HCT 45.3 42.7 42.0  MCV 87.1 86.8 87.9  PLT 255 247 234   Cardiac Enzymes: No results found for this basename: CKTOTAL, CKMB, CKMBINDEX, TROPONINI,  in the last 168 hours BNP (last 3 results)  Recent Labs  01/05/14 1350  PROBNP 209.9*   CBG: No results found for this basename: GLUCAP,  in the last  168 hours  Recent Results (from the past 240 hour(s))  MRSA PCR SCREENING     Status: None   Collection Time    01/21/14  2:27 PM      Result Value Ref Range Status   MRSA by PCR NEGATIVE  NEGATIVE Final   Comment:            The GeneXpert MRSA Assay (FDA     approved for NASAL specimens     only), is one component of a     comprehensive MRSA colonization     surveillance program. It is not     intended to diagnose MRSA     infection nor to guide or     monitor treatment for     MRSA infections.     Studies: No results found.  Scheduled Meds: . aspirin  81 mg Oral Daily  . docusate sodium  100 mg Oral BID  . enoxaparin (LOVENOX)  injection  100 mg Subcutaneous Q24H  . FLUoxetine  20 mg Oral Daily  . pantoprazole (PROTONIX) IV  40 mg Intravenous Q12H   Continuous Infusions: . sodium chloride 1,000 mL (01/21/14 1134)  . sodium chloride 1,000 mL (01/23/14 1426)    Principal Problem:   Pancreatitis Active Problems:   OSA (obstructive sleep apnea)   Abnormal transaminases   Morbid obesity    Time spent: 30 minutes.     [name], Belkys A  [facility] Pager [phone]. If 7PM-7AM, please contact night-coverage at [email or web address], password [email or web address] 01/23/2014, 4:27 PM  LOS: 2 days

## 2014-01-24 NOTE — Op Note (Signed)
[facility] [address] [place], [id number]   ENDOSCOPIC ULTRASOUND PROCEDURE REPORT  PATIENT: Richard Sandoval, Richard Sandoval  MR#: [id number] BIRTHDATE: 03-26-1946  GENDER: Male ENDOSCOPIST: Willis ModenaWilliam [name], MD REFERRED BY:  [facility] PROCEDURE DATE:  01/24/2014 PROCEDURE:   Upper EUS ASA CLASS:      Class IV INDICATIONS:   1.  pancreatitis, elevated LFTs. MEDICATIONS: MAC sedation, administered by CRNA  DESCRIPTION OF PROCEDURE:   After the risks benefits and alternatives of the procedure were  explained, informed consent was obtained. The patient was then placed in the left, lateral, decubitus postion and IV sedation was administered. Throughout the procedure, the patients blood pressure, pulse and oxygen saturations were monitored continuously.  Under direct visualization, the radial forward-viewing echoendoscope was introduced through the mouth  and advanced to the second portion of the duodenum .  Water was used as necessary to provide an acoustic interface.  Upon completion of the imaging, water was removed and the patient was sent to the recovery room in satisfactory condition.    FINDINGS:      Diffusely heterogeneous pancreas without focal mass. Mild pancreatic inflammatory changes.  Couple triangular reactive-appearing periportal lymph nodes.  Gallbladder wall diffusely mildly thickened.  Large amount of dependent sludge seen within the gallbladder.  Bile duct non-dilated but had symmetrical diffuse wall thickening, likely reactive-appearing.  No choledocholithiasis identified.  Ampulla normal via EUS.  IMPRESSION:     As above.  Patient's recurrent pancreatitis is almost assuredly due to gallbladder sludge.  RECOMMENDATIONS:     1.  Watch for potential complications of procedure. 2.  Clear liquid diet. 3.  Surgical consultation for consideration of cholecystectomy. 4.  No need for ERCP based on today's endoscopic ultrasound findings. 5.  [facility]  inpatient team will follow.   _______________________________ Rosalie DoctoreSignedWillis Modena:  William [name], MD 01/24/2014 12:58 PM   CC:

## 2014-01-24 NOTE — Progress Notes (Signed)
Pt states he does not need any assistance with CPAP machine or mask application. CPAP is set on auto titrate min-5 & max-20cmH2O. RT made pt aware if he should need any assistance to contact RT.

## 2014-01-24 NOTE — Anesthesia Postprocedure Evaluation (Signed)
Anesthesia Post Note  Patient: Richard Sandoval  Procedure(s) Performed: Procedure(s) (LRB): UPPER ENDOSCOPIC ULTRASOUND (EUS) RADIAL (N/A)  Anesthesia type: MAC  Patient location: PACU  Post pain: Pain level controlled  Post assessment: Post-op Vital signs reviewed  Last Vitals: BP 162/81  Pulse 70  Temp(Src) 36.7 C (Oral)  Resp 22  Ht 5\' 11"  (1.803 m)  Wt 427 lb 3.2 oz (193.777 kg)  BMI 59.61 kg/m2  SpO2 100%  Post vital signs: Reviewed  Level of consciousness: awake  Complications: No apparent anesthesia complications

## 2014-01-24 NOTE — Interval H&P Note (Signed)
History and Physical Interval Note:  01/24/2014 12:00 PM  Richard HawkLarry Mcnally  has presented today for surgery, with the diagnosis of Pancreatitis  The various methods of treatment have been discussed with the patient and family. After consideration of risks, benefits and other options for treatment, the patient has consented to  Procedure(s): UPPER ENDOSCOPIC ULTRASOUND (EUS) RADIAL (N/A) as a surgical intervention .  The patient's history has been reviewed, patient examined, no change in status, stable for surgery.  I have reviewed the patient's chart and labs.  Questions were answered to the patient's satisfaction.     [name],[name]  Assessment:  1.  Idiopathic pancreatitis.  Plan:  1.  Endoscopic ultrasound +/- fine needle aspiration (FNA) biopsies. 2.  Risks (bleeding, infection, bowel perforation that could require surgery, sedation-related changes in cardiopulmonary systems), benefits (identification and possible treatment of source of symptoms, exclusion of certain causes of symptoms), and alternatives (watchful waiting, radiographic imaging studies, empiric medical treatment) of upper endoscopy with ultrasound (EUS +/- FNA) were explained to patient/family in detail and patient wishes to proceed.

## 2014-01-24 NOTE — Anesthesia Preprocedure Evaluation (Signed)
Anesthesia Evaluation  Patient identified by MRN, date of birth, ID band Patient awake    Reviewed: Allergy & Precautions, H&P , NPO status , Patient's Chart, lab work & pertinent test results  Airway Mallampati: III TM Distance: <3 FB Neck ROM: Limited    Dental   Pulmonary shortness of breath, sleep apnea ,  breath sounds clear to auscultation        Cardiovascular hypertension, Pt. on medications Rhythm:Regular Rate:Normal     Neuro/Psych Depression    GI/Hepatic   Endo/Other  Morbid obesity  Renal/GU      Musculoskeletal   Abdominal (+) + obese,   Peds  Hematology   Anesthesia Other Findings   Reproductive/Obstetrics                           Anesthesia Physical  Anesthesia Plan  ASA: IV  Anesthesia Plan: MAC   Post-op Pain Management:    Induction: Intravenous  Airway Management Planned: Simple Face Mask  Additional Equipment:   Intra-op Plan:   Post-operative Plan:   Informed Consent: I have reviewed the patients History and Physical, chart, labs and discussed the procedure including the risks, benefits and alternatives for the proposed anesthesia with the patient or authorized representative who has indicated his/her understanding and acceptance.   Dental advisory given  Plan Discussed with: CRNA  Anesthesia Plan Comments:         Anesthesia Quick Evaluation

## 2014-01-24 NOTE — Consult Note (Signed)
Reason for Consult:  Pancreatitis  Referring Physician: Dr. Arta Silence  Richard Sandoval is an 68 y.o. male.  HPI: Pt admitted with pancreatitis on 01/05/14 and lipase greater than 3000.  This was his first episode and no prior history of GI issues. He saw Dr. Michail Sermon on 6/26 and was doing well.  He return on 6/27 with recurrent pain and elevated lipase 663. Prior work up on 01/05/14 showed no gallstones CBD 74m, but was limited due to his size.  CT scan on 01/08/14:  The gallbladder is normal. No biliary dilatation. Mild diffuse edema of the pancreas with mild peripancreatic fat stranding. No evidence for pancreatic necrosis or pseudocyst. No pancreatic duct dilatation. The spleen is normal. Today he underwent EUS by Dr. OPaulita Fujitaand his findings include:  Diffusely heterogeneous pancreas without focal mass.  Mild pancreatic inflammatory changes. Couple triangular reactive-appearing periportal lymph nodes. Gallbladder wall diffusely mildly thickened. Large amount of dependent sludge seen within the gallbladder. Bile duct non-dilated but had symmetrical  diffuse wall thickening, likely reactive-appearing. No choledocholithiasis identified. Ampulla normal via EUS. It is his opinion patients recurrent pancreatitis is due to sludge from the gallbladder and we are ask to see.  Past Medical History  Diagnosis  Date  Morbid obesity/Body mass index is 59.6 OSA (obstructive sleep apnea)/on CPAP since 1995  Chronic back pain since surgery 2000.  This is his limiting factor for activity Pancreatitis Hypertension/enlarged heart DVT (deep venous thrombosis) /L leg in 2009    Past Surgical History  Procedure Laterality Date  . Back surgery  04/09/1999  . Knee surgery  1998  . Left inguinal hernia    . Lumbar laminectomy    . Cervical laminectomy and fusion    . Trigger finger release  08/04/2012    Procedure: MINOR RELEASE TRIGGER FINGER/A-1 PULLEY;  Surgeon: GWynonia Sours MD;  [facility]  Service: Orthopedics;  Laterality: Right;    Family History  Problem Relation Age of Onset  . Breast cancer Mother     Social History:  reports that he has never smoked. He has never used smokeless tobacco. He reports that he does not drink alcohol or use illicit drugs.  Allergies:  Allergies  Allergen Reactions  . Penicillins Itching    Medications:  Prior to Admission:  Prescriptions prior to admission  Medication Sig Dispense Refill  . aspirin 81 MG tablet Take 81 mg by mouth daily.      . cetirizine (ZYRTEC) 10 MG tablet Take 10 mg by mouth daily.      .Marland KitchenFLUoxetine (PROZAC) 20 MG tablet Take 20 mg by mouth daily.      . mupirocin ointment (BACTROBAN) 2 % Place 1 application into the nose 3 (three) times daily.       Scheduled: . aspirin  81 mg Oral Daily  . docusate sodium  100 mg Oral BID  . enoxaparin (LOVENOX) injection  100 mg Subcutaneous Q24H  . FLUoxetine  20 mg Oral Daily  . pantoprazole (PROTONIX) IV  40 mg Intravenous Q12H   Continuous: . sodium chloride 1,000 mL (01/23/14 1426)  . sodium chloride 1,000 mL (01/24/14 0049)   PZRA:[id number] acetaminophen, HYDROcodone-acetaminophen, morphine injection, ondansetron (ZOFRAN) IV, ondansetron Anti-infectives   None      Results for orders placed during the hospital encounter of 01/21/14 (from the past 48 hour(s))  COMPREHENSIVE METABOLIC PANEL     Status: Abnormal   Collection Time    01/23/14  4:59 AM  Result Value Ref Range   Sodium 138  137 - 147 mEq/L   Potassium 3.9  3.7 - 5.3 mEq/L   Chloride 102  96 - 112 mEq/L   CO2 26  19 - 32 mEq/L   Glucose, Bld 91  70 - 99 mg/dL   BUN 10  6 - 23 mg/dL   Creatinine, Ser 0.95  0.50 - 1.35 mg/dL   Calcium 8.7  8.4 - 10.5 mg/dL   Total Protein 6.7  6.0 - 8.3 g/dL   Albumin 3.0 (*) 3.5 - 5.2 g/dL   AST 84 (*) 0 - 37 U/L   ALT 134 (*) 0 - 53 U/L   Alkaline Phosphatase 109  39 - 117 U/L   Total Bilirubin 1.8 (*) 0.3 - 1.2 mg/dL   GFR calc non  Af Amer 84 (*) >90 mL/min   GFR calc Af Amer >90  >90 mL/min   Comment: (NOTE)     The eGFR has been calculated using the CKD EPI equation.     This calculation has not been validated in all clinical situations.     eGFR's persistently <90 mL/min signify possible Chronic Kidney     Disease.  CBC     Status: None   Collection Time    01/23/14  4:59 AM      Result Value Ref Range   WBC 9.8  4.0 - 10.5 K/uL   RBC 4.78  4.22 - 5.81 MIL/uL   Hemoglobin 13.7  13.0 - 17.0 g/dL   HCT 42.0  39.0 - 52.0 %   MCV 87.9  78.0 - 100.0 fL   MCH 28.7  26.0 - 34.0 pg   MCHC 32.6  30.0 - 36.0 g/dL   RDW 13.8  11.5 - 15.5 %   Platelets 234  150 - 400 K/uL  LIPASE, BLOOD     Status: None   Collection Time    01/23/14  4:59 AM      Result Value Ref Range   Lipase 20  11 - 59 U/L  LIPASE, BLOOD     Status: None   Collection Time    01/24/14  4:46 AM      Result Value Ref Range   Lipase 18  11 - 59 U/L  COMPREHENSIVE METABOLIC PANEL     Status: Abnormal   Collection Time    01/24/14  4:46 AM      Result Value Ref Range   Sodium 136 (*) 137 - 147 mEq/L   Potassium 3.3 (*) 3.7 - 5.3 mEq/L   Chloride 100  96 - 112 mEq/L   CO2 24  19 - 32 mEq/L   Glucose, Bld 96  70 - 99 mg/dL   BUN 8  6 - 23 mg/dL   Creatinine, Ser 0.89  0.50 - 1.35 mg/dL   Calcium 8.7  8.4 - 10.5 mg/dL   Total Protein 7.0  6.0 - 8.3 g/dL   Albumin 3.0 (*) 3.5 - 5.2 g/dL   AST 51 (*) 0 - 37 U/L   ALT 98 (*) 0 - 53 U/L   Alkaline Phosphatase 98  39 - 117 U/L   Total Bilirubin 1.5 (*) 0.3 - 1.2 mg/dL   GFR calc non Af Amer 87 (*) >90 mL/min   GFR calc Af Amer >90  >90 mL/min   Comment: (NOTE)     The eGFR has been calculated using the CKD EPI equation.     This calculation has not been  validated in all clinical situations.     eGFR's persistently <90 mL/min signify possible Chronic Kidney     Disease.  CBC     Status: None   Collection Time    01/24/14  4:46 AM      Result Value Ref Range   WBC 7.5  4.0 - 10.5 K/uL    RBC 4.84  4.22 - 5.81 MIL/uL   Hemoglobin 13.8  13.0 - 17.0 g/dL   HCT 42.4  39.0 - 52.0 %   MCV 87.6  78.0 - 100.0 fL   MCH 28.5  26.0 - 34.0 pg   MCHC 32.5  30.0 - 36.0 g/dL   RDW 13.7  11.5 - 15.5 %   Platelets 243  150 - 400 K/uL    No results found.  Review of Systems  Constitutional: Negative for weight loss (His weight was up to 440 earlier this year.).       His weight is up about 100 pounds since his surgery in 2000.  HENT: Negative.   Eyes: Negative.   Respiratory:       Sleep apnea, on CPAP.  He reports his wife wakes him when he stops breathing.  Cardiovascular: Negative.        He did have a stress test back in 2000, before his back surgery.  Gastrointestinal: Positive for heartburn (over last 2 weeks with the onset of this pancreatitis.), abdominal pain (he still points to and has some pain mid abdomen.) and constipation (occasional). Negative for nausea, vomiting, diarrhea, blood in stool and melena.       Colonoscopy 2-3 years ago.   Genitourinary: Negative.   Musculoskeletal: Positive for back pain (chronic back pain is his most limiting factor.). Negative for falls and joint pain.  Skin: Negative.   Neurological: Negative.   Endo/Heme/Allergies: Negative.   Psychiatric/Behavioral: Negative.    Blood pressure 162/81, pulse 70, temperature 98.1 F (36.7 C), temperature source Oral, resp. rate 22, height 5' 11"  (1.803 m), weight 193.777 kg (427 lb 3.2 oz), SpO2 100.00%. Physical Exam  Constitutional: He is oriented to person, place, and time. He appears well-developed and well-nourished. No distress.  Body mass index is 59.61 kg/(m^2).   HENT:  Head: Normocephalic and atraumatic.  Eyes: Conjunctivae and EOM are normal. Pupils are equal, round, and reactive to light. Right eye exhibits no discharge. Left eye exhibits no discharge. No scleral icterus.  Neck: Normal range of motion. Neck supple. No JVD present. No tracheal deviation present. No thyromegaly present.   Cardiovascular: Normal rate, regular rhythm, normal heart sounds and intact distal pulses.  Exam reveals no gallop.   No murmur heard. Respiratory: Effort normal and breath sounds normal. No stridor. No respiratory distress. He has no wheezes. He has no rales. He exhibits no tenderness.  GI: Soft. Bowel sounds are normal. He exhibits no distension and no mass. There is tenderness (still sore and tender mid epigastric area.). There is no rebound and no guarding.  Some ecchymosis from injection of heparin for DVT  Musculoskeletal: He exhibits no edema.  Lymphadenopathy:    He has no cervical adenopathy.  Neurological: He is alert and oriented to person, place, and time. No cranial nerve deficit.  Skin: Skin is warm and dry. No rash noted. He is not diaphoretic. No erythema. No pallor.  Psychiatric: He has a normal mood and affect. His behavior is normal. Judgment and thought content normal.    Assessment/Plan: 1.  Recurrent pancreatitis with gallbladder sludge 2.  BMI 59.6 3.  OSA (obstructive sleep apnea)/on CPAP since 1995  4.  Chronic back pain since surgery 2000.  This is his limiting factor for activity 5.  Pancreatitis 6.  Hypertension/enlarged heart 7.  DVT (deep venous thrombosis) /L leg in 2009   Plan:  Dr. Harlow Asa will review and  discuss.  His size and sleep apnea are primary concers. He would need medical clearance for any possible surgery.  LFT's are trending down.  He is also still a bit tender even thought his Lipase is better. I will recheck labs in AM, and keep NPO after MN.    Richard Sandoval 01/24/2014, 2:25 PM

## 2014-01-24 NOTE — Consult Note (Signed)
General Surgery [facility]  Patient seen and examined.  Wife at bedside.  Discussed with Will Marlyne BeardsJennings, PA-C.  Reviewed EUS.  Will attempt to schedule lap chole with IOC for tomorrow, 7/1, if OR time allows and I can arrange for a surgeon to assist.  Certainly at markedly increased risk due to morbid obesity.  Will re-evaluate in AM and check AM labs.  Final decision for surgery at that time.  [name] M. Gerkin, MD, [facility] Office: [phone]

## 2014-01-25 ENCOUNTER — Encounter (HOSPITAL_COMMUNITY): Payer: Managed Care, Other (non HMO) | Admitting: *Deleted

## 2014-01-25 ENCOUNTER — Encounter (HOSPITAL_COMMUNITY)
Admission: EM | Disposition: A | Discharge status: Home / Self Care | Payer: Self-pay | Source: Home / Self Care | Attending: Internal Medicine

## 2014-01-25 ENCOUNTER — Inpatient Hospital Stay (HOSPITAL_COMMUNITY): Payer: Managed Care, Other (non HMO)

## 2014-01-25 ENCOUNTER — Inpatient Hospital Stay (HOSPITAL_COMMUNITY): Payer: Managed Care, Other (non HMO) | Admitting: *Deleted

## 2014-01-25 ENCOUNTER — Encounter (HOSPITAL_COMMUNITY): Payer: Self-pay | Admitting: Gastroenterology

## 2014-01-25 DIAGNOSIS — K828 Other specified diseases of gallbladder: Secondary | ICD-10-CM | POA: Diagnosis not present

## 2014-01-25 DIAGNOSIS — K839 Disease of biliary tract, unspecified: Secondary | ICD-10-CM | POA: Diagnosis not present

## 2014-01-25 DIAGNOSIS — G4733 Obstructive sleep apnea (adult) (pediatric): Secondary | ICD-10-CM

## 2014-01-25 DIAGNOSIS — R7402 Elevation of levels of lactic acid dehydrogenase (LDH): Secondary | ICD-10-CM | POA: Diagnosis not present

## 2014-01-25 DIAGNOSIS — R7401 Elevation of levels of liver transaminase levels: Secondary | ICD-10-CM | POA: Diagnosis not present

## 2014-01-25 DIAGNOSIS — R740 Nonspecific elevation of levels of transaminase and lactic acid dehydrogenase [LDH]: Secondary | ICD-10-CM

## 2014-01-25 DIAGNOSIS — E876 Hypokalemia: Secondary | ICD-10-CM | POA: Diagnosis present

## 2014-01-25 DIAGNOSIS — F3289 Other specified depressive episodes: Secondary | ICD-10-CM

## 2014-01-25 DIAGNOSIS — F329 Major depressive disorder, single episode, unspecified: Secondary | ICD-10-CM

## 2014-01-25 DIAGNOSIS — K859 Acute pancreatitis without necrosis or infection, unspecified: Secondary | ICD-10-CM | POA: Diagnosis not present

## 2014-01-25 DIAGNOSIS — K851 Biliary acute pancreatitis without necrosis or infection: Secondary | ICD-10-CM | POA: Diagnosis present

## 2014-01-25 HISTORY — PX: CHOLECYSTECTOMY: SHX55

## 2014-01-25 LAB — COMPREHENSIVE METABOLIC PANEL
ALT: 71 U/L — AB (ref 0–53)
AST: 34 U/L (ref 0–37)
Albumin: 2.7 g/dL — ABNORMAL LOW (ref 3.5–5.2)
Alkaline Phosphatase: 78 U/L (ref 39–117)
BUN: 8 mg/dL (ref 6–23)
CHLORIDE: 104 meq/L (ref 96–112)
CO2: 24 meq/L (ref 19–32)
Calcium: 8.4 mg/dL (ref 8.4–10.5)
Creatinine, Ser: 0.88 mg/dL (ref 0.50–1.35)
GFR, EST NON AFRICAN AMERICAN: 87 mL/min — AB (ref >90)
GLUCOSE: 99 mg/dL (ref 70–99)
POTASSIUM: 3.4 meq/L — AB (ref 3.7–5.3)
SODIUM: 139 meq/L (ref 137–147)
Total Bilirubin: 1.3 mg/dL — ABNORMAL HIGH (ref 0.3–1.2)
Total Protein: 6.5 g/dL (ref 6.0–8.3)

## 2014-01-25 LAB — CBC
HCT: 37.6 % — ABNORMAL LOW (ref 39.0–52.0)
HEMOGLOBIN: 12.5 g/dL — AB (ref 13.0–17.0)
MCH: 28.9 pg (ref 26.0–34.0)
MCHC: 33.2 g/dL (ref 30.0–36.0)
MCV: 86.8 fL (ref 78.0–100.0)
Platelets: 236 10*3/uL (ref 150–400)
RBC: 4.33 MIL/uL (ref 4.22–5.81)
RDW: 13.5 % (ref 11.5–15.5)
WBC: 6.3 10*3/uL (ref 4.0–10.5)

## 2014-01-25 LAB — SURGICAL PCR SCREEN
MRSA, PCR: NEGATIVE
Staphylococcus aureus: NEGATIVE

## 2014-01-25 LAB — LIPASE, BLOOD: Lipase: 15 U/L (ref 11–59)

## 2014-01-25 SURGERY — LAPAROSCOPIC CHOLECYSTECTOMY WITH INTRAOPERATIVE CHOLANGIOGRAM
Anesthesia: General | Site: Abdomen

## 2014-01-25 MED ORDER — SUCCINYLCHOLINE CHLORIDE 20 MG/ML IJ SOLN
INTRAMUSCULAR | Status: DC | PRN
  Administered 2014-01-25: 140 mg via INTRAVENOUS

## 2014-01-25 MED ORDER — GLYCOPYRROLATE 0.2 MG/ML IJ SOLN
INTRAMUSCULAR | Status: DC | PRN
  Administered 2014-01-25: .8 mg via INTRAVENOUS

## 2014-01-25 MED ORDER — CIPROFLOXACIN IN D5W 400 MG/200ML IV SOLN
400.0000 mg | INTRAVENOUS | Status: DC
  Filled 2014-01-25: qty 200

## 2014-01-25 MED ORDER — ONDANSETRON HCL 4 MG PO TABS: 4.0000 mg | ORAL_TABLET | Freq: Four times a day (QID) | ORAL | Status: DC | PRN

## 2014-01-25 MED ORDER — ONDANSETRON HCL 4 MG/2ML IJ SOLN
INTRAMUSCULAR | Status: DC | PRN
  Administered 2014-01-25: 4 mg via INTRAVENOUS

## 2014-01-25 MED ORDER — HYDROMORPHONE HCL PF 1 MG/ML IJ SOLN
1.0000 mg | INTRAMUSCULAR | Status: DC | PRN
  Administered 2014-01-25 – 2014-01-26 (×3): 1 mg via INTRAVENOUS
  Filled 2014-01-25 (×3): qty 1

## 2014-01-25 MED ORDER — CIPROFLOXACIN IN D5W 400 MG/200ML IV SOLN
INTRAVENOUS | Status: DC | PRN
  Administered 2014-01-25: 400 mg via INTRAVENOUS

## 2014-01-25 MED ORDER — LACTATED RINGERS IV SOLN
INTRAVENOUS | Status: DC
Start: 2014-01-25 — End: 2014-01-25
  Administered 2014-01-25: 1000 mL via INTRAVENOUS

## 2014-01-25 MED ORDER — NEOSTIGMINE METHYLSULFATE 10 MG/10ML IV SOLN
INTRAVENOUS | Status: DC | PRN
  Administered 2014-01-25: 5 mg via INTRAVENOUS

## 2014-01-25 MED ORDER — METOCLOPRAMIDE HCL 5 MG/ML IJ SOLN
INTRAMUSCULAR | Status: DC | PRN
  Administered 2014-01-25: 10 mg via INTRAVENOUS

## 2014-01-25 MED ORDER — NEOSTIGMINE METHYLSULFATE 10 MG/10ML IV SOLN
INTRAVENOUS | Status: AC
  Filled 2014-01-25: qty 1

## 2014-01-25 MED ORDER — ENOXAPARIN SODIUM 40 MG/0.4ML ~~LOC~~ SOLN
40.0000 mg | SUBCUTANEOUS | Status: DC
  Administered 2014-01-26: 40 mg via SUBCUTANEOUS
  Filled 2014-01-25 (×2): qty 0.4

## 2014-01-25 MED ORDER — ROCURONIUM BROMIDE 100 MG/10ML IV SOLN
INTRAVENOUS | Status: DC | PRN
  Administered 2014-01-25: 35 mg via INTRAVENOUS
  Administered 2014-01-25: 5 mg via INTRAVENOUS

## 2014-01-25 MED ORDER — MIDAZOLAM HCL 5 MG/5ML IJ SOLN
INTRAMUSCULAR | Status: DC | PRN
  Administered 2014-01-25: 2 mg via INTRAVENOUS

## 2014-01-25 MED ORDER — LACTATED RINGERS IV SOLN
INTRAVENOUS | Status: DC | PRN
  Administered 2014-01-25 (×2): via INTRAVENOUS

## 2014-01-25 MED ORDER — BUPIVACAINE-EPINEPHRINE 0.5% -1:200000 IJ SOLN
INTRAMUSCULAR | Status: DC | PRN
  Administered 2014-01-25: 20 mL

## 2014-01-25 MED ORDER — ONDANSETRON HCL 4 MG/2ML IJ SOLN: 4.0000 mg | Freq: Four times a day (QID) | INTRAMUSCULAR | Status: DC | PRN

## 2014-01-25 MED ORDER — FENTANYL CITRATE 0.05 MG/ML IJ SOLN
INTRAMUSCULAR | Status: AC
  Filled 2014-01-25: qty 2

## 2014-01-25 MED ORDER — IOHEXOL 300 MG/ML  SOLN
INTRAMUSCULAR | Status: DC | PRN
  Administered 2014-01-25: 10 mL via INTRAVENOUS

## 2014-01-25 MED ORDER — FENTANYL CITRATE 0.05 MG/ML IJ SOLN
INTRAMUSCULAR | Status: AC
  Filled 2014-01-25: qty 5

## 2014-01-25 MED ORDER — GLYCOPYRROLATE 0.2 MG/ML IJ SOLN
INTRAMUSCULAR | Status: AC
  Filled 2014-01-25: qty 4

## 2014-01-25 MED ORDER — POTASSIUM CHLORIDE IN NACL 20-0.9 MEQ/L-% IV SOLN
INTRAVENOUS | Status: DC
  Filled 2014-01-25: qty 1000

## 2014-01-25 MED ORDER — BUPIVACAINE-EPINEPHRINE 0.5% -1:200000 IJ SOLN
INTRAMUSCULAR | Status: AC
  Filled 2014-01-25: qty 1

## 2014-01-25 MED ORDER — HYDROCODONE-ACETAMINOPHEN 5-325 MG PO TABS
1.0000 | ORAL_TABLET | ORAL | Status: DC | PRN
  Administered 2014-01-26: 2 via ORAL
  Filled 2014-01-25: qty 2

## 2014-01-25 MED ORDER — FENTANYL CITRATE 0.05 MG/ML IJ SOLN
25.0000 ug | INTRAMUSCULAR | Status: DC | PRN
  Administered 2014-01-25 (×3): 50 ug via INTRAVENOUS

## 2014-01-25 MED ORDER — ACETAMINOPHEN 325 MG PO TABS: 650.0000 mg | ORAL_TABLET | ORAL | Status: DC | PRN

## 2014-01-25 MED ORDER — CIPROFLOXACIN IN D5W 400 MG/200ML IV SOLN
INTRAVENOUS | Status: AC
  Filled 2014-01-25: qty 200

## 2014-01-25 MED ORDER — ESMOLOL HCL 10 MG/ML IV SOLN
INTRAVENOUS | Status: DC | PRN
  Administered 2014-01-25: 20 ug via INTRAVENOUS
  Administered 2014-01-25: 25 ug via INTRAVENOUS
  Administered 2014-01-25: 30 ug via INTRAVENOUS
  Administered 2014-01-25: 25 ug via INTRAVENOUS

## 2014-01-25 MED ORDER — KCL IN DEXTROSE-NACL 20-5-0.45 MEQ/L-%-% IV SOLN
INTRAVENOUS | Status: DC
  Administered 2014-01-25 – 2014-01-26 (×2): via INTRAVENOUS
  Administered 2014-01-26: 75 mL via INTRAVENOUS
  Administered 2014-01-27: 09:00:00 via INTRAVENOUS
  Filled 2014-01-25 (×5): qty 1000

## 2014-01-25 MED ORDER — PROPOFOL 10 MG/ML IV BOLUS
INTRAVENOUS | Status: DC | PRN
  Administered 2014-01-25: 200 mg via INTRAVENOUS

## 2014-01-25 MED ORDER — FENTANYL CITRATE 0.05 MG/ML IJ SOLN
INTRAMUSCULAR | Status: DC | PRN
  Administered 2014-01-25 (×3): 50 ug via INTRAVENOUS
  Administered 2014-01-25 (×2): 100 ug via INTRAVENOUS

## 2014-01-25 SURGICAL SUPPLY — 34 items
APPLIER CLIP ROT 10 11.4 M/L (STAPLE) ×3
BENZOIN TINCTURE PRP APPL 2/3 (GAUZE/BANDAGES/DRESSINGS) ×3 IMPLANT
CABLE HIGH FREQUENCY MONO STRZ (ELECTRODE) ×3 IMPLANT
CANISTER SUCTION 2500CC (MISCELLANEOUS) ×3 IMPLANT
CHLORAPREP W/TINT 26ML (MISCELLANEOUS) ×3 IMPLANT
CLIP APPLIE ROT 10 11.4 M/L (STAPLE) ×1 IMPLANT
CLOSURE WOUND 1/2 X4 (GAUZE/BANDAGES/DRESSINGS) ×1
COVER MAYO STAND STRL (DRAPES) ×3 IMPLANT
DECANTER SPIKE VIAL GLASS SM (MISCELLANEOUS) ×3 IMPLANT
DRAPE C-ARM 42X120 X-RAY (DRAPES) ×3 IMPLANT
DRAPE LAPAROSCOPIC ABDOMINAL (DRAPES) ×3 IMPLANT
DRAPE UTILITY XL STRL (DRAPES) ×3 IMPLANT
ELECT REM PT RETURN 9FT ADLT (ELECTROSURGICAL) ×3
ELECTRODE REM PT RTRN 9FT ADLT (ELECTROSURGICAL) ×1 IMPLANT
GLOVE SURG ORTHO 8.0 STRL STRW (GLOVE) ×3 IMPLANT
GOWN STRL REUS W/TWL XL LVL3 (GOWN DISPOSABLE) ×6 IMPLANT
HEMOSTAT SURGICEL 4X8 (HEMOSTASIS) IMPLANT
KIT BASIN OR (CUSTOM PROCEDURE TRAY) ×3 IMPLANT
NS IRRIG 1000ML POUR BTL (IV SOLUTION) ×3 IMPLANT
POUCH SPECIMEN RETRIEVAL 10MM (ENDOMECHANICALS) ×3 IMPLANT
SCISSORS LAP 5X35 DISP (ENDOMECHANICALS) ×3 IMPLANT
SET CHOLANGIOGRAPH MIX (MISCELLANEOUS) ×3 IMPLANT
SET IRRIG TUBING LAPAROSCOPIC (IRRIGATION / IRRIGATOR) ×3 IMPLANT
SLEEVE XCEL OPT CAN 5 100 (ENDOMECHANICALS) ×3 IMPLANT
SOLUTION ANTI FOG 6CC (MISCELLANEOUS) ×3 IMPLANT
STRIP CLOSURE SKIN 1/2X4 (GAUZE/BANDAGES/DRESSINGS) ×2 IMPLANT
SUT MNCRL AB 4-0 PS2 18 (SUTURE) ×3 IMPLANT
TOWEL OR 17X26 10 PK STRL BLUE (TOWEL DISPOSABLE) ×3 IMPLANT
TOWEL OR NON WOVEN STRL DISP B (DISPOSABLE) ×3 IMPLANT
TRAY LAP CHOLE (CUSTOM PROCEDURE TRAY) ×3 IMPLANT
TROCAR BLADELESS OPT 5 100 (ENDOMECHANICALS) ×3 IMPLANT
TROCAR XCEL BLUNT TIP 100MML (ENDOMECHANICALS) ×3 IMPLANT
TROCAR XCEL NON-BLD 11X100MML (ENDOMECHANICALS) ×3 IMPLANT
TUBING INSUFFLATION 10FT LAP (TUBING) ×3 IMPLANT

## 2014-01-25 NOTE — H&P (View-Only) (Signed)
Patient ID: Richard HawkLarry Sandoval, male   DOB: [birth year], 68 y.o.   MRN: [id number]  [facility] - Progress Note  Subjective: Patient without abdominal pain overnight.  Anxious to proceed with surgery today.  Objective: Vital signs in last 24 hours: Temp:  [97 F (36.1 C)-98.1 F (36.7 C)] 97.9 F (36.6 C) (06/30 2152) Pulse Rate:  [61-75] 61 (06/30 2152) Resp:  [18-22] 20 (06/30 2152) BP: (156-176)/(66-96) 169/96 mmHg (06/30 2152) SpO2:  [98 %-100 %] 100 % (06/30 2152) Last BM Date: 01/23/14  Intake/Output from previous day: 06/30 0701 - 07/01 0700 In: 2140 [P.O.:1440; I.V.:200; Blood:500] Out: 950 [Urine:950]  Exam: HEENT - clear, not icteric Neck - soft Chest - clear bilaterally Cor - RRR, no murmur Abd - obese, soft, non-tender; probably small umbilical hernia Neuro - grossly intact, no focal deficits  Lab Results:   Recent Labs  01/24/14 0446 01/25/14 0420  WBC 7.5 6.3  HGB 13.8 12.5*  HCT 42.4 37.6*  PLT 243 236     Recent Labs  01/24/14 0446 01/25/14 0420  NA 136* 139  K 3.3* 3.4*  CL 100 104  CO2 24 24  GLUCOSE 96 99  BUN 8 8  CREATININE 0.89 0.88  CALCIUM 8.7 8.4    Studies/Results: No results found.  Assessment / Plan: 1.  Recurrent pancreatitis, gallbladder sludge  Discussed with GI - request cholecystectomy for recurrent pancreatitis  Discussed lap vs open cholecystectomy with patient  Plan to proceed to OR this AM  The risks and benefits of the procedure have been discussed at length with the patient.  The patient understands the proposed procedure, potential alternative treatments, and the course of recovery to be expected.  All of the patient's questions have been answered at this time.  The patient wishes to proceed with surgery.  [name] M. Arnita Koons, MD, [facility] Office: [phone]  01/25/2014

## 2014-01-25 NOTE — Addendum Note (Signed)
Addendum created 01/25/14 1622 by Azell DerBruce J Germany Dodgen, MD   Modules edited: Notes Section   Notes Section:  File: [id number]

## 2014-01-25 NOTE — Anesthesia Preprocedure Evaluation (Addendum)
Anesthesia Evaluation  Patient identified by MRN, date of birth, ID band Patient awake  General Assessment Comment:Past Medical History   Diagnosis  Date   .  DVT (deep venous thrombosis)         L leg in 2009   .  Hypertension         enlarged heart   .  OSA (obstructive sleep apnea)         on CPAP since 1995   .  Pancreatitis     .  Morbid obesity     Reviewed: Allergy & Precautions, H&P , NPO status , Patient's Chart, lab work & pertinent test results  Airway Mallampati: III TM Distance: >3 FB Neck ROM: Full    Dental  (+) Dental Advisory Given, Loose,    Pulmonary shortness of breath, sleep apnea and Continuous Positive Airway Pressure Ventilation ,  breath sounds clear to auscultation  Pulmonary exam normal       Cardiovascular hypertension, Rhythm:Regular Rate:Normal     Neuro/Psych PSYCHIATRIC DISORDERS Depression negative neurological ROS     GI/Hepatic negative GI ROS, Neg liver ROS,   Endo/Other  Morbid obesity  Renal/GU negative Renal ROS  negative genitourinary   Musculoskeletal negative musculoskeletal ROS (+)   Abdominal   Peds negative pediatric ROS (+)  Hematology negative hematology ROS (+)   Anesthesia Other Findings   Reproductive/Obstetrics negative OB ROS                         Anesthesia Physical Anesthesia Plan  ASA: IV  Anesthesia Plan: General   Post-op Pain Management:    Induction: Intravenous  Airway Management Planned: Oral ETT  Additional Equipment:   Intra-op Plan:   Post-operative Plan: Extubation in OR  Informed Consent: I have reviewed the patients History and Physical, chart, labs and discussed the procedure including the risks, benefits and alternatives for the proposed anesthesia with the patient or authorized representative who has indicated his/her understanding and acceptance.   Dental advisory given  Plan Discussed with:  CRNA  Anesthesia Plan Comments:        Anesthesia Quick Evaluation

## 2014-01-25 NOTE — Interval H&P Note (Signed)
History and Physical Interval Note:  01/25/2014 10:41 AM  Richard Sandoval  has presented today for surgery, with the diagnosis of gallbladder sludge and recurrent pancreatitis.  The various methods of treatment have been discussed with the patient and family. After consideration of risks, benefits and other options for treatment, the patient has consented to    Procedure(s): LAPAROSCOPIC CHOLECYSTECTOMY WITH INTRAOPERATIVE CHOLANGIOGRAM possible open (N/A) as a surgical intervention .    The patient's history has been reviewed, patient examined, no change in status, stable for surgery.  I have reviewed the patient's chart and labs.  Questions were answered to the patient's satisfaction.    [name] M. Princeton Nabor, MD, [facility] Office: [phone]    [name] MJudie Petit

## 2014-01-25 NOTE — Transfer of Care (Signed)
Immediate Anesthesia Transfer of Care Note  Patient: Richard Sandoval  Procedure(s) Performed: Procedure(s): LAPAROSCOPIC CHOLECYSTECTOMY WITH INTRAOPERATIVE CHOLANGIOGRAM (N/A)  Patient Location: PACU  Anesthesia Type:General  Level of Consciousness: awake, alert , oriented and patient cooperative  Airway & Oxygen Therapy: Patient Spontanous Breathing and Patient connected to face mask oxygen  Post-op Assessment: Report given to PACU RN, Post -op Vital signs reviewed and stable and Patient moving all extremities  Post vital signs: Reviewed and stable  Complications: No apparent anesthesia complications

## 2014-01-25 NOTE — Progress Notes (Signed)
Progress Note   Richard Sandoval [id number] DOB: 22-May-1946 DOA: 01/21/2014 PCP: Cala BradfordWHITE,[name], MD   Brief Narrative:   Richard Sandoval is an 68 y.o. male with a PMH of morbid obesity, OSA on CPAP, prior pancreatitis with hospitalization 01/05/14-01/11/14 outpatient endoscopic ultrasound planned, however readmitted 01/21/14 with recurrent pancreatitis.  Assessment/Plan:   Principal Problem:   Pancreatitis  Evaluated by GI 01/21/14 with recommendations for medical management including IV fluids, bowel rest, and analgesics.  Underwent endoscopic ultrasound 01/24/14. Pancreas was diffusely heterogeneous without focal mass. Sludge noted in gallbladder.  Surgical consultation performed 01/24/14 with plans for laparoscopic cholecystectomy today.  Lipase now normal.  Active Problems:   Depression  Continue Prozac.    Hypokalemia  Add potassium to IV fluids.    OSA (obstructive sleep apnea)  Continue nocturnal CPAP.    Abnormal transaminases  Secondary to gallbladder sludge. For cholecystectomy today.    Morbid obesity  Currently n.p.o. in anticipation of surgery. Recommend dietary counseling postoperatively.    DVT Prophylaxis  Continue SCDs.  Code Status: Full. Family Communication: Wife at bedside. Disposition Plan: Home when stable.   IV Access:    Peripheral IV   Procedures:    Endoscopic ultrasound 01/24/14: Findings:Diffusely heterogeneous pancreas without focal mass. Mild pancreatic inflammatory changes. Couple triangular reactive-appearing periportal lymph nodes. Gallbladder wall diffusely mildly thickened. Large amount of dependent sludge seen within the gallbladder. Bile duct non-dilated but had symmetrical diffuse wall thickening, likely reactive-appearing. No choledocholithiasis identified. Ampulla normal via EUS.    Medical Consultants:    Dr. Darnell Levelodd Gerkin, Surgery.  Dr. Willis ModenaWilliam [name], GI.   Other Consultants:     None.   Anti-Infectives:    None.  Subjective:   Richard Sandoval feels well preoperatively. He denies nausea, vomiting, loose stools. His last bowel movement was 2 days ago and was normal. Has some mild epigastric discomfort but no frank pain. No dyspnea or cough.  Objective:    Filed Vitals:   01/24/14 1330 01/24/14 2152 01/25/14 0715 01/25/14 0938  BP: 162/84 169/96 156/94 166/88  Pulse: 68 61 60 61  Temp: 97 F (36.1 C) 97.9 F (36.6 C) 97.9 F (36.6 C) 98.1 F (36.7 C)  TempSrc: Oral Oral Oral Oral  Resp: 18 20 20 18   Height:      Weight:   193.595 kg (426 lb 12.8 oz)   SpO2: 99% 100% 100% 100%    Intake/Output Summary (Last 24 hours) at 01/25/14 0955 Last data filed at 01/25/14 0851  Gross per 24 hour  Intake 6796.25 ml  Output   1500 ml  Net 5296.25 ml    Exam: Gen:  NAD Cardiovascular:  RRR, No M/R/G Respiratory:  Lungs diminished Gastrointestinal:  Abdomen soft, NT/ND, + BS Extremities:  No C/E/C   Data Reviewed:    Labs: Basic Metabolic Panel:  Recent Labs Lab 01/21/14 0812 01/22/14 0540 01/23/14 0459 01/24/14 0446 01/25/14 0420  NA 134* 138 138 136* 139  K 4.2 3.9 3.9 3.3* 3.4*  CL 98 102 102 100 104  CO2 23 24 26 24 24   GLUCOSE 151* 119* 91 96 99  BUN 16 10 10 8 8   CREATININE 0.92 0.88 0.95 0.89 0.88  CALCIUM 9.1 8.8 8.7 8.7 8.4   GFR Estimated Creatinine Clearance: 141.3 ml/min (by C-G formula based on Cr of 0.88). Liver Function Tests:  Recent Labs Lab 01/21/14 0812 01/22/14 0540 01/23/14 0459 01/24/14 0446 01/25/14 0420  AST 61* 181* 84* 51* 34  ALT 40  195* 134* 98* 71*  ALKPHOS 83 120* 109 98 78  BILITOT 1.0 3.1* 1.8* 1.5* 1.3*  PROT 8.2 6.9 6.7 7.0 6.5  ALBUMIN 3.5 3.0* 3.0* 3.0* 2.7*    Recent Labs Lab 01/21/14 0812 01/22/14 0540 01/23/14 0459 01/24/14 0446 01/25/14 0420  LIPASE 663* 217* 20 18 15     CBC:  Recent Labs Lab 01/21/14 0812 01/22/14 0540 01/23/14 0459 01/24/14 0446 01/25/14 0420   WBC 6.8 8.7 9.8 7.5 6.3  NEUTROABS 5.2  --   --   --   --   HGB 14.9 14.2 13.7 13.8 12.5*  HCT 45.3 42.7 42.0 42.4 37.6*  MCV 87.1 86.8 87.9 87.6 86.8  PLT 255 247 234 243 236   BNP (last 3 results)  Recent Labs  01/05/14 1350  PROBNP 209.9*   Microbiology Recent Results (from the past 240 hour(s))  MRSA PCR SCREENING     Status: None   Collection Time    01/21/14  2:27 PM      Result Value Ref Range Status   MRSA by PCR NEGATIVE  NEGATIVE Final   Comment:            The GeneXpert MRSA Assay (FDA     approved for NASAL specimens     only), is one component of a     comprehensive MRSA colonization     surveillance program. It is not     intended to diagnose MRSA     infection nor to guide or     monitor treatment for     MRSA infections.  SURGICAL PCR SCREEN     Status: None   Collection Time    01/24/14  9:53 PM      Result Value Ref Range Status   MRSA, PCR NEGATIVE  NEGATIVE Final   Staphylococcus aureus NEGATIVE  NEGATIVE Final   Comment:            The Xpert SA Assay (FDA     approved for NASAL specimens     in patients over 67 years of age),     is one component of     a comprehensive surveillance     program.  Test performance has     been validated by [facility] for patients greater     than or equal to 67 year old.     It is not intended     to diagnose infection nor to     guide or monitor treatment.     [facility]     Radiographs/Studies:   Dg Abd Acute W/chest  01/21/2014   CLINICAL DATA:  Nausea, abdominal pain, history of pancreatitis  EXAM: ACUTE ABDOMEN SERIES (ABDOMEN 2 VIEW & CHEST 1 VIEW)  COMPARISON:  01/08/2014, 01/05/2014.  FINDINGS: Cardiomediastinal silhouette is unremarkable. No acute infiltrate or pleural effusion. No pulmonary edema. There is nonspecific nonobstructive bowel gas pattern. Again noted postsurgical changes lumbar spine. Study is limited by patient's large body habitus. No free abdominal air.   IMPRESSION: Negative abdominal radiographs.  No acute cardiopulmonary disease.   Electronically Signed   By: Natasha MeadLiviu  Pop M.D.   On: 01/21/2014 10:23    Medications:   . aspirin  81 mg Oral Daily  . ciprofloxacin  400 mg Intravenous On Call to OR  . docusate sodium  100 mg Oral BID  . FLUoxetine  20 mg Oral Daily  . pantoprazole (PROTONIX) IV  40 mg Intravenous  Q12H   Continuous Infusions: . sodium chloride 125 mL/hr at 01/24/14 1546  . sodium chloride 1,000 mL (01/24/14 0049)    Time spent: 25 minutes.   LOS: 4 days   RAMA,CHRISTINA  [facility] Pager [phone]. If unable to reach me by pager, please call my cell phone at [phone].  *Please refer to [email or web address], password [email or web address] to get updated schedule on who will round on this patient, as hospitalists switch teams weekly. If 7PM-7AM, please contact night-coverage at [email or web address], password [email or web address] for any overnight needs.  01/25/2014, 9:55 AM    **Disclaimer: This note was dictated with voice recognition software. Similar sounding words can inadvertently be transcribed and this note may contain transcription errors which may not have been corrected upon publication of note.**

## 2014-01-25 NOTE — Op Note (Signed)
Procedure Note  Pre-operative Diagnosis:  Gallbladder sludge, recurrent pancreatitis  Post-operative Diagnosis:  Cholelithiasis, chronic cholecystitis, gallbladder sludge, recurrent pancreatitis  Surgeon:  [name] M. Rainie Crenshaw, MD, FACS  Assistant:  Luretha MurphyMatthew Martin, MD, FACS   Procedure:  Laparoscopic cholecystectomy with intra-operative cholangiography  Anesthesia:  General  Estimated Blood Loss:  minimal  Drains: none         Specimen: Gallbladder to pathology  Indications:  Patient is a 68 yo BM with recurrent acute pancreatitis.  EUS shows gallbladder sludge.  Cholecystectomy requested by gastroenterology.  Procedure Details:  The patient was seen in the pre-op holding area. The risks, benefits, complications, treatment options, and expected outcomes were previously discussed with the patient. The patient agreed with the proposed plan and has signed the informed consent form.  The patient was brought to the Operating Room, identified as Richard Sandoval and the procedure verified as laparoscopic cholecystectomy with intraoperative cholangiography. A "time out" was completed and the above information confirmed.  The patient was placed in the supine position. Following induction of general anesthesia, the abdomen was prepped and draped in the usual aseptic fashion.  An incision was made in the skin near the right costal margin. Using the optiview 5 mm trocar, the trocar was advanced under direct vision by Dr. Daphine DeutscherMartin into the peritoneal cavity. Pneumoperitoneum was established with carbon dioxide. Additional trocars were introduced under direct vision along the right costal margin, in the midline above the level of the umbilicus, and in the sub-xiphoid postion.   The gallbladder was identified and the fundus grasped and retracted cephalad. Adhesions were taken down bluntly and the electrocautery was utilized as needed, taking care not to injure any adjacent structures. The infundibulum was  grasped and retracted laterally, exposing the peritoneum overlying the triangle of Calot. The peritoneum was incised and structures exposed with blunt dissection. The cystic duct was clearly identified, bluntly dissected circumferentially, and clipped at the neck of the gallbladder.  An incision was made in the cystic duct and the cholangiogram catheter introduced. The catheter was secured using an ligaclip.  Real-time cholangiography was performed using C-arm fluoroscopy.  There was rapid filling of a normal caliber common bile duct.  There was reflux of contrast into the left and right hepatic ductal systems.  There was free flow distally into the duodenum without filling defect or obstruction.  The catheter was removed from the peritoneal cavity.  The cystic duct was then ligated with surgical clips and divided. The cystic artery was identified, dissected circumferentially, ligated with ligaclips, and divided.  The gallbladder was dissected away from the liver bed using the electrocautery for hemostasis. The gallbladder was completely removed from the liver and placed into an endocatch bag. The right upper quadrant was irrigated and the gallbladder bed was inspected. Hemostasis was achieved with the electrocautery.  Gallbladder was removed through the sub-xiphoid port.  On inspection, it did contain multiple small gallstones.  Pneumoperitoneum was released after viewing removal of the trocars with good hemostasis noted.  Local anesthetic was infiltrated at all port sites. The skin incisions were closed with 4-0 Monocril subcuticular sutures and steri-strips and dressings were applied.  Instrument, sponge, and needle counts were correct at the conclusion of the case.  The patient was awakened from anesthesia and brought to the recovery room in stable condition.  The patient tolerated the procedure well.   [name] M. Danell Verno, MD, [facility] Office: [phone]

## 2014-01-25 NOTE — Progress Notes (Signed)
Pt stated he does not need any assistance with cpap tonight.  Cpap settings are on autotitrate mode 5-20 cm h2o.  Pt stated he did not want any water added to humidity chamber at this time.  Pt was advised that RT is available all night should he need further assistance and to let nurse know to call.

## 2014-01-25 NOTE — Anesthesia Postprocedure Evaluation (Addendum)
  Anesthesia Post-op Note  Patient: Richard Sandoval  Procedure(s) Performed: Procedure(s) (LRB): LAPAROSCOPIC CHOLECYSTECTOMY WITH INTRAOPERATIVE CHOLANGIOGRAM (N/A)  Patient Location: PACU  Anesthesia Type: General  Level of Consciousness: awake and alert   Airway and Oxygen Therapy: Patient Spontanous Breathing  Post-op Pain: mild  Post-op Assessment: Post-op Vital signs reviewed, Patient's Cardiovascular Status Stable, Respiratory Function Stable, Patent Airway and No signs of Nausea or vomiting  Last Vitals:  Filed Vitals:   01/25/14 1500  BP: 147/87  Pulse: 85  Temp: 37.1 C  Resp: 15    Post-op Vital Signs: stable   Complications: No apparent anesthesia complications. To floor on OSA precautions.

## 2014-01-25 NOTE — Progress Notes (Signed)
Patient ID: Richard Sandoval, male   DOB: 10/24/1945, 68 y.o.   MRN: [id number]  General Surgery - [facility] - Progress Note  Subjective: Patient without abdominal pain overnight.  Anxious to proceed with surgery today.  Objective: Vital signs in last 24 hours: Temp:  [97 F (36.1 C)-98.1 F (36.7 C)] 97.9 F (36.6 C) (06/30 2152) Pulse Rate:  [61-75] 61 (06/30 2152) Resp:  [18-22] 20 (06/30 2152) BP: (156-176)/(66-96) 169/96 mmHg (06/30 2152) SpO2:  [98 %-100 %] 100 % (06/30 2152) Last BM Date: 01/23/14  Intake/Output from previous day: 06/30 0701 - 07/01 0700 In: 2140 [P.O.:1440; I.V.:200; Blood:500] Out: 950 [Urine:950]  Exam: HEENT - clear, not icteric Neck - soft Chest - clear bilaterally Cor - RRR, no murmur Abd - obese, soft, non-tender; probably small umbilical hernia Neuro - grossly intact, no focal deficits  Lab Results:   Recent Labs  01/24/14 0446 01/25/14 0420  WBC 7.5 6.3  HGB 13.8 12.5*  HCT 42.4 37.6*  PLT 243 236     Recent Labs  01/24/14 0446 01/25/14 0420  NA 136* 139  K 3.3* 3.4*  CL 100 104  CO2 24 24  GLUCOSE 96 99  BUN 8 8  CREATININE 0.89 0.88  CALCIUM 8.7 8.4    Studies/Results: No results found.  Assessment / Plan: 1.  Recurrent pancreatitis, gallbladder sludge  Discussed with GI - request cholecystectomy for recurrent pancreatitis  Discussed lap vs open cholecystectomy with patient  Plan to proceed to OR this AM  The risks and benefits of the procedure have been discussed at length with the patient.  The patient understands the proposed procedure, potential alternative treatments, and the course of recovery to be expected.  All of the patient's questions have been answered at this time.  The patient wishes to proceed with surgery.  Lamika Connolly M. Maricela Kawahara, MD, FACS [facility] Office: [phone]  01/25/2014  

## 2014-01-25 NOTE — Progress Notes (Signed)
[facility] PROGRESS NOTE Subjective Pt notes he feels much better today no pain. EUS  Shows sludge in GB, no CBD stones mild pancreatitis  Objective: Vital signs in last 24 hours: Temp:  [97 F (36.1 C)-98.1 F (36.7 C)] 97.9 F (36.6 C) (06/30 2152) Pulse Rate:  [61-75] 61 (06/30 2152) Resp:  [18-22] 20 (06/30 2152) BP: (156-176)/(66-96) 169/96 mmHg (06/30 2152) SpO2:  [98 %-100 %] 100 % (06/30 2152) Last BM Date: 01/23/14  Intake/Output from previous day: 06/30 0701 - 07/01 0700 In: 2140 [P.O.:1440; I.V.:200; Blood:500] Out: 950 [Urine:950] Intake/Output this shift:    PE: General--obese AA male with CPAP Heart-- Lungs--clear Abdomen--obese, nontender  Lab Results:  Recent Labs  01/23/14 0459 01/24/14 0446 01/25/14 0420  WBC 9.8 7.5 6.3  HGB 13.7 13.8 12.5*  HCT 42.0 42.4 37.6*  PLT 234 243 236   BMET  Recent Labs  01/23/14 0459 01/24/14 0446 01/25/14 0420  NA 138 136* 139  K 3.9 3.3* 3.4*  CL 102 100 104  CO2 26 24 24   CREATININE 0.95 0.89 0.88   LFT  Recent Labs  01/23/14 0459 01/24/14 0446 01/25/14 0420  PROT 6.7 7.0 6.5  AST 84* 51* 34  ALT 134* 98* 71*  ALKPHOS 109 98 78  BILITOT 1.8* 1.5* 1.3*   PT/INR No results found for this basename: LABPROT, INR,  in the last 72 hours PANCREAS  Recent Labs  01/23/14 0459 01/24/14 0446 01/25/14 0420  LIPASE 20 18 15          Studies/Results: No results found.  Medications: I have reviewed the patient's current medications.  Assessment/Plan: 1. GS Pancreatitis. For lap chole today. Will be available if needed.   Sacheen Arrasmith JR,Leonela Kivi L 01/25/2014, 7:24 AM

## 2014-01-26 ENCOUNTER — Encounter (HOSPITAL_COMMUNITY): Payer: Self-pay | Admitting: Surgery

## 2014-01-26 DIAGNOSIS — R7401 Elevation of levels of liver transaminase levels: Secondary | ICD-10-CM | POA: Diagnosis not present

## 2014-01-26 DIAGNOSIS — K859 Acute pancreatitis without necrosis or infection, unspecified: Secondary | ICD-10-CM | POA: Diagnosis not present

## 2014-01-26 LAB — CBC
HCT: 39.9 % (ref 39.0–52.0)
Hemoglobin: 13 g/dL (ref 13.0–17.0)
MCH: 28.4 pg (ref 26.0–34.0)
MCHC: 32.6 g/dL (ref 30.0–36.0)
MCV: 87.1 fL (ref 78.0–100.0)
PLATELETS: 239 10*3/uL (ref 150–400)
RBC: 4.58 MIL/uL (ref 4.22–5.81)
RDW: 13.6 % (ref 11.5–15.5)
WBC: 6.8 10*3/uL (ref 4.0–10.5)

## 2014-01-26 LAB — BASIC METABOLIC PANEL
ANION GAP: 9 (ref 5–15)
BUN: 7 mg/dL (ref 6–23)
CALCIUM: 8.5 mg/dL (ref 8.4–10.5)
CO2: 26 mEq/L (ref 19–32)
CREATININE: 0.9 mg/dL (ref 0.50–1.35)
Chloride: 102 mEq/L (ref 96–112)
GFR calc Af Amer: >90 mL/min (ref >90)
GFR, EST NON AFRICAN AMERICAN: 86 mL/min — AB (ref >90)
GLUCOSE: 125 mg/dL — AB (ref 70–99)
Potassium: 3.5 mEq/L — ABNORMAL LOW (ref 3.7–5.3)
Sodium: 137 mEq/L (ref 137–147)

## 2014-01-26 MED ORDER — POTASSIUM CHLORIDE CRYS ER 20 MEQ PO TBCR
20.0000 meq | EXTENDED_RELEASE_TABLET | Freq: Two times a day (BID) | ORAL | Status: DC
  Administered 2014-01-26: 20 meq via ORAL
  Filled 2014-01-26 (×2): qty 1

## 2014-01-26 MED ORDER — POTASSIUM CHLORIDE CRYS ER 20 MEQ PO TBCR: 20.0000 meq | EXTENDED_RELEASE_TABLET | Freq: Two times a day (BID) | ORAL | Status: DC

## 2014-01-26 NOTE — Progress Notes (Signed)
Pt was sitting up in chair when I arrived. He said he was feeling much btr from surgery ystrdy. He said he was told by dr he ate the wrong thing for lunch; otherwise he was fine. Pt's pastor had been by earlier. Pt appreciated visit. Marjory Liesamela Carrington Holder Chaplain  01/26/14 1300  Clinical Encounter Type  Visited With Patient  Visit Type Initial

## 2014-01-26 NOTE — Discharge Instructions (Signed)
  [facility]  LAPAROSCOPIC SURGERY - POST-OP INSTRUCTIONS  Always review your discharge instruction sheet given to you by the facility where your surgery was performed.  A prescription for pain medication may be given to you upon discharge.  Take your pain medication as prescribed.  If narcotic pain medicine is not needed, then you may take acetaminophen (Tylenol) or ibuprofen (Advil) as needed.  Take your usually prescribed medications unless otherwise directed.  If you need a refill on your pain medication, please contact your pharmacy.  They will contact our office to request authorization. Prescriptions will not be filled after 5 P.M. or on weekends.  You should follow a light diet the first few days after arrival home, such as soup and crackers or toast.  Be sure to include plenty of fluids daily.  Most patients will experience some swelling and bruising in the area of the incisions.  Ice packs will help.  Swelling and bruising can take several days to resolve.   It is common to experience some constipation if taking pain medication after surgery.  Increasing fluid intake and taking a stool softener (such as Colace) will usually help or prevent this problem from occurring.  A mild laxative (Milk of Magnesia or Miralax) should be taken according to package instructions if there are no bowel movements after 48 hours.  Unless discharge instructions indicate otherwise, you may remove your bandages 24-48 hours after surgery, and you may shower at that time.  You may have steri-strips (small skin tapes) in place directly over the incision.  These strips should be left on the skin for 7-10 days.  If your surgeon used skin glue on the incision, you may shower in 24 hours.  The glue will flake off over the next 2-3 weeks.  Any sutures or staples will be removed at the office during your follow-up visit.  ACTIVITIES:  You may resume regular (light) daily activities beginning the  next day-such as daily self-care, walking, climbing stairs-gradually increasing activities as tolerated.  You may have sexual intercourse when it is comfortable.  Refrain from any heavy lifting or straining until approved by your doctor.  You may drive when you are no longer taking prescription pain medication, you can comfortably wear a seatbelt, and you can safely maneuver your car and apply brakes.  You should see your doctor in the office for a follow-up appointment approximately 2-3 weeks after your surgery.  Make sure that you call for this appointment within a day or two after you arrive home to insure a convenient appointment time.  WHEN TO CALL YOUR DOCTOR: 1. Fever over 101.0 2. Inability to urinate 3. Continued bleeding from incision 4. Increased pain, redness, or drainage from the incision 5. Increasing abdominal pain  The clinic staff is available to answer your questions during regular business hours.  Please don't hesitate to call and ask to speak to one of the nurses for clinical concerns.  If you have a medical emergency, go to the nearest emergency room or call 911.  A surgeon from [facility] is always on call for the hospital.  Chrysten Woulfe M. Holston Oyama, MD, FACS [facility] Office: [phone] Toll Free:  [phone] FAX [phone]  Web site: [email or web address] 

## 2014-01-26 NOTE — Progress Notes (Signed)
Progress Note   Richard Sandoval ZOX:[id number] DOB: 12-03-1945 DOA: 01/21/2014 PCP: Cala Bradford, MD   Brief Narrative:   Richard Sandoval is an 68 y.o. male with a PMH of morbid obesity, OSA on CPAP, prior pancreatitis with hospitalization 01/05/14-01/11/14 outpatient endoscopic ultrasound planned, however readmitted 01/21/14 with recurrent pancreatitis.  Assessment/Plan:   Principal Problem:   Pancreatitis  Evaluated by GI 01/21/14 with recommendations for medical management including IV fluids, bowel rest, and analgesics.  Underwent endoscopic ultrasound 01/24/14. Pancreas was diffusely heterogeneous without focal mass. Sludge noted in gallbladder.  Surgical consultation performed 01/24/14, status post laparoscopic cholecystectomy 01/25/14.  Active Problems:   Depression  Continue Prozac.    Hypokalemia  Continue to replete.    OSA (obstructive sleep apnea)  Continue nocturnal CPAP.    Abnormal transaminases  Secondary to gallbladder sludge. Status post cholecystectomy 01/25/14.    Morbid obesity  Diet advanced to heart healthy.    DVT Prophylaxis  Continue SCDs.  Code Status: Full. Family Communication: Wife at bedside 01/25/14. Disposition Plan: Home when stable.   IV Access:    Peripheral IV   Procedures:    Endoscopic ultrasound 01/24/14: Findings:Diffusely heterogeneous pancreas without focal mass. Mild pancreatic inflammatory changes. Couple triangular reactive-appearing periportal lymph nodes. Gallbladder wall diffusely mildly thickened. Large amount of dependent sludge seen within the gallbladder. Bile duct non-dilated but had symmetrical diffuse wall thickening, likely reactive-appearing. No choledocholithiasis identified. Ampulla normal via EUS.    Medical Consultants:    Dr. Darnell Level, Surgery.  Dr. Willis Modena, GI.   Other Consultants:    None.   Anti-Infectives:    None.  Subjective:   [name] feels a bit nauseated  after eating. He has not yet passed flatus or moved his bowels. Some abdominal discomfort at the incision site.  Objective:    Filed Vitals:   01/25/14 1500 01/25/14 2300 01/26/14 0652 01/26/14 1416  BP: 147/87 178/95 150/78 123/61  Pulse: 85 70 71 75  Temp: 98.8 F (37.1 C) 98.6 F (37 C) 99.4 F (37.4 C) 97.6 F (36.4 C)  TempSrc:  Oral Oral Oral  Resp: 15 16 16 16   Height:      Weight:   192.144 kg (423 lb 9.6 oz)   SpO2: 95% 94% 95% 99%    Intake/Output Summary (Last 24 hours) at 01/26/14 1539 Last data filed at 01/26/14 1305  Gross per 24 hour  Intake   1080 ml  Output   2675 ml  Net  -1595 ml    Exam: Gen:  NAD Cardiovascular:  RRR, No M/R/G Respiratory:  Lungs diminished Gastrointestinal:  Abdomen soft, NT/ND, + BS Extremities:  No C/E/C   Data Reviewed:    Labs: Basic Metabolic Panel:  Recent Labs Lab 01/22/14 0540 01/23/14 0459 01/24/14 0446 01/25/14 0420 01/26/14 0426  NA 138 138 136* 139 137  K 3.9 3.9 3.3* 3.4* 3.5*  CL 102 102 100 104 102  CO2 24 26 24 24 26   GLUCOSE 119* 91 96 99 125*  BUN 10 10 8 8 7   CREATININE 0.88 0.95 0.89 0.88 0.90  CALCIUM 8.8 8.7 8.7 8.4 8.5   GFR Estimated Creatinine Clearance: 137.4 ml/min (by C-G formula based on Cr of 0.9). Liver Function Tests:  Recent Labs Lab 01/21/14 0812 01/22/14 0540 01/23/14 0459 01/24/14 0446 01/25/14 0420  AST 61* 181* 84* 51* 34  ALT 40 195* 134* 98* 71*  ALKPHOS 83 120* 109 98 78  BILITOT 1.0 3.1* 1.8* 1.5*  1.3*  PROT 8.2 6.9 6.7 7.0 6.5  ALBUMIN 3.5 3.0* 3.0* 3.0* 2.7*    Recent Labs Lab 01/21/14 0812 01/22/14 0540 01/23/14 0459 01/24/14 0446 01/25/14 0420  LIPASE 663* 217* 20 18 15     CBC:  Recent Labs Lab 01/21/14 0812 01/22/14 0540 01/23/14 0459 01/24/14 0446 01/25/14 0420 01/26/14 0426  WBC 6.8 8.7 9.8 7.5 6.3 6.8  NEUTROABS 5.2  --   --   --   --   --   HGB 14.9 14.2 13.7 13.8 12.5* 13.0  HCT 45.3 42.7 42.0 42.4 37.6* 39.9  MCV 87.1 86.8  87.9 87.6 86.8 87.1  PLT 255 247 234 243 236 239   BNP (last 3 results)  Recent Labs  01/05/14 1350  PROBNP 209.9*   Microbiology Recent Results (from the past 240 hour(s))  MRSA PCR SCREENING     Status: None   Collection Time    01/21/14  2:27 PM      Result Value Ref Range Status   MRSA by PCR NEGATIVE  NEGATIVE Final   Comment:            The GeneXpert MRSA Assay (FDA     approved for NASAL specimens     only), is one component of a     comprehensive MRSA colonization     surveillance program. It is not     intended to diagnose MRSA     infection nor to guide or     monitor treatment for     MRSA infections.  SURGICAL PCR SCREEN     Status: None   Collection Time    01/24/14  9:53 PM      Result Value Ref Range Status   MRSA, PCR NEGATIVE  NEGATIVE Final   Staphylococcus aureus NEGATIVE  NEGATIVE Final   Comment:            The Xpert SA Assay (FDA     approved for NASAL specimens     in patients over 67 years of age),     is one component of     a comprehensive surveillance     program.  Test performance has     been validated by [facility] for patients greater     than or equal to 56 year old.     It is not intended     to diagnose infection nor to     guide or monitor treatment.     Performed at [facility]     Radiographs/Studies:   Dg Abd Acute W/chest  01/21/2014   CLINICAL DATA:  Nausea, abdominal pain, history of pancreatitis  EXAM: ACUTE ABDOMEN SERIES (ABDOMEN 2 VIEW & CHEST 1 VIEW)  COMPARISON:  01/08/2014, 01/05/2014.  FINDINGS: Cardiomediastinal silhouette is unremarkable. No acute infiltrate or pleural effusion. No pulmonary edema. There is nonspecific nonobstructive bowel gas pattern. Again noted postsurgical changes lumbar spine. Study is limited by patient's large body habitus. No free abdominal air.  IMPRESSION: Negative abdominal radiographs.  No acute cardiopulmonary disease.   Electronically Signed   By: Richard Sandoval  Pop M.D.   On:  01/21/2014 10:23    Medications:   . aspirin  81 mg Oral Daily  . docusate sodium  100 mg Oral BID  . enoxaparin (LOVENOX) injection  40 mg Subcutaneous Q24H  . FLUoxetine  20 mg Oral Daily  . pantoprazole (PROTONIX) IV  40 mg Intravenous Q12H   Continuous Infusions: . dextrose 5 %  and 0.45 % NaCl with KCl 20 mEq/L 75 mL (01/26/14 0449)    Time spent: 25 minutes.   LOS: 5 days   Alashia Brownfield  [facility] Pager [phone]. If unable to reach me by pager, please call my cell phone at [phone].  *Please refer to [email or web address], password [email or web address] to get updated schedule on who will round on this patient, as hospitalists switch teams weekly. If 7PM-7AM, please contact night-coverage at [email or web address], password [email or web address] for any overnight needs.  01/26/2014, 3:39 PM    **Disclaimer: This note was dictated with voice recognition software. Similar sounding words can inadvertently be transcribed and this note may contain transcription errors which may not have been corrected upon publication of note.**

## 2014-01-26 NOTE — Progress Notes (Signed)
Pt states that he does not need assistance with his CPAP tonight. Pt is on auto titrate mode min 5 max 20. Pt does not want water added to the water chamber. Advised pt to call if he needs any assistance, pt communicates understanding.

## 2014-01-26 NOTE — Progress Notes (Signed)
Patient ID: Richard Sandoval, male   DOB: February 18, 1946, 68 y.o.   MRN: [id number]  General Surgery - [facility] - Progress Note  POD# 1  Subjective: Patient ambulating in halls.  Wife at bedside.  Doing well post op.  "Feel much better".  Objective: Vital signs in last 24 hours: Temp:  [98 F (36.7 C)-99.4 F (37.4 C)] 99.4 F (37.4 C) (07/02 0652) Pulse Rate:  [61-85] 71 (07/02 0652) Resp:  [11-18] 16 (07/02 0652) BP: (147-195)/(73-95) 150/78 mmHg (07/02 0652) SpO2:  [94 %-100 %] 95 % (07/02 0652) Weight:  [423 lb 9.6 oz (192.144 kg)] 423 lb 9.6 oz (192.144 kg) (07/02 0652) Last BM Date: 01/23/14  Intake/Output from previous day: 07/01 0701 - 07/02 0700 In: 1540 [P.O.:240; I.V.:1300] Out: 3345 [Urine:3325; Blood:20]  Exam: HEENT - clear, not icteric Neck - soft Chest - clear bilaterally Cor - RRR, no murmur Abd - soft, obese; dressings dry and intact Ext - no significant edema Neuro - grossly intact, no focal deficits  Lab Results:   Recent Labs  01/25/14 0420 01/26/14 0426  WBC 6.3 6.8  HGB 12.5* 13.0  HCT 37.6* 39.9  PLT 236 239     Recent Labs  01/25/14 0420 01/26/14 0426  NA 139 137  K 3.4* 3.5*  CL 104 102  CO2 24 26  GLUCOSE 99 125*  BUN 8 7  CREATININE 0.88 0.90  CALCIUM 8.4 8.5    Studies/Results: Dg Cholangiogram Operative  01/25/2014   CLINICAL DATA:  Cholecystectomy for cholelithiasis.  EXAM: INTRAOPERATIVE CHOLANGIOGRAM  TECHNIQUE: Cholangiographic images from the C-arm fluoroscopic device were submitted for interpretation post-operatively. Please see the procedural report for the amount of contrast and the fluoroscopy time utilized.  COMPARISON:  CT on 01/08/2014 and abdominal ultrasound on 01/05/2014.  FINDINGS: Intraoperative C-arm imaging demonstrates a normal opacified biliary tree without evidence of obstruction or filling defect. Contrast enters the duodenum. No extravasation is identified.  IMPRESSION: Normal  intraoperative cholangiogram.   Electronically Signed   By: Irish LackGlenn  Yamagata M.D.   On: 01/25/2014 12:42    Assessment / Plan: 1.  Status post lap chole with IOC  Regular diet this AM  OK for discharge home from surgical standpoint  Home instructions on chart  Will see in office 3 weeks for post op visit  [name] M. [name], MD, [facility] Office: [phone]  01/26/2014

## 2014-01-27 DIAGNOSIS — E876 Hypokalemia: Secondary | ICD-10-CM

## 2014-01-27 DIAGNOSIS — K859 Acute pancreatitis without necrosis or infection, unspecified: Secondary | ICD-10-CM | POA: Diagnosis not present

## 2014-01-27 DIAGNOSIS — R7402 Elevation of levels of lactic acid dehydrogenase (LDH): Secondary | ICD-10-CM | POA: Diagnosis not present

## 2014-01-27 LAB — BASIC METABOLIC PANEL
Anion gap: 9 (ref 5–15)
BUN: 7 mg/dL (ref 6–23)
CALCIUM: 8.8 mg/dL (ref 8.4–10.5)
CO2: 29 mEq/L (ref 19–32)
CREATININE: 1.02 mg/dL (ref 0.50–1.35)
Chloride: 101 mEq/L (ref 96–112)
GFR calc Af Amer: 86 mL/min — ABNORMAL LOW (ref >90)
GFR, EST NON AFRICAN AMERICAN: 74 mL/min — AB (ref >90)
GLUCOSE: 113 mg/dL — AB (ref 70–99)
Potassium: 3.7 mEq/L (ref 3.7–5.3)
SODIUM: 139 meq/L (ref 137–147)

## 2014-01-27 MED ORDER — ONDANSETRON HCL 4 MG PO TABS: 4.0000 mg | ORAL_TABLET | Freq: Four times a day (QID) | ORAL | Status: DC | PRN

## 2014-01-27 MED ORDER — HYDROCODONE-ACETAMINOPHEN 5-325 MG PO TABS: 1.0000 | ORAL_TABLET | ORAL | Status: DC | PRN

## 2014-01-27 NOTE — Discharge Summary (Signed)
Physician Discharge Summary  Richard Sandoval ZOX:[id number] DOB: 1946/05/02 DOA: 01/21/2014  PCP: Cala Bradford, MD  Admit date: 01/21/2014 Discharge date: 01/27/2014   Recommendations for Outpatient Follow-Up:   1. F/U scheduled with Dr. Gerrit Friends in 3 weeks.   Discharge Diagnosis:   Principal Problem:    Pancreatitis secondary to gallbladder sludge status post laparoscopic cholecystectomy Active Problems:    DEPRESSIVE DISORDER, NOS    OSA (obstructive sleep apnea)    Abnormal transaminases    Morbid obesity    Hypokalemia    Pancreatitis, secondary to sludge from the gallbladder   Discharge Condition: Improved.  Diet recommendation: Low sodium, heart healthy.   History of Present Illness:   Richard Sandoval is an 68 y.o. male with a PMH of morbid obesity, OSA on CPAP, prior pancreatitis with hospitalization 01/05/14-01/11/14 outpatient endoscopic ultrasound planned, however readmitted 01/21/14 with recurrent pancreatitis.  Hospital Course by Problem:   Principal Problem:  Pancreatitis  Evaluated by GI 01/21/14 with recommendations for medical management including IV fluids, bowel rest, and analgesics.  Underwent endoscopic ultrasound 01/24/14. Pancreas was diffusely heterogeneous without focal mass. Sludge noted in gallbladder.  Surgical consultation performed 01/24/14, status post laparoscopic cholecystectomy 01/25/14.  Active Problems:  Depression  Continue Prozac.  Hypokalemia  Repleted.  OSA (obstructive sleep apnea)  Continue nocturnal CPAP.  Abnormal transaminases  Secondary to gallbladder sludge. Status post cholecystectomy 01/25/14.  Morbid obesity  Diet advanced to heart healthy. Diet counseling provided.   Procedures:    Endoscopic ultrasound 01/24/14: Findings:Diffusely heterogeneous pancreas without focal mass. Mild pancreatic inflammatory changes. Couple triangular reactive-appearing periportal lymph nodes. Gallbladder wall diffusely mildly  thickened. Large amount of dependent sludge seen within the gallbladder. Bile duct non-dilated but had symmetrical diffuse wall thickening, likely reactive-appearing. No choledocholithiasis identified. Ampulla normal via EUS.   Laparoscopic cholecystectomy with intraoperative cholangiography 01/25/14   Radiographs as noted below.    Medical Consultants:    Dr. Darnell Level, Surgery   Discharge Exam:   Filed Vitals:   01/27/14 0448  BP: 173/77  Pulse: 66  Temp: 97.7 F (36.5 C)  Resp: 16   Filed Vitals:   01/26/14 0652 01/26/14 1416 01/26/14 2131 01/27/14 0448  BP: 150/78 123/61 159/78 173/77  Pulse: 71 75 72 66  Temp: 99.4 F (37.4 C) 97.6 F (36.4 C) 98.4 F (36.9 C) 97.7 F (36.5 C)  TempSrc: Oral Oral Oral Oral  Resp: 16 16 18 16   Height:      Weight: 192.144 kg (423 lb 9.6 oz)   192.507 kg (424 lb 6.4 oz)  SpO2: 95% 99% 98% 97%    Gen:  NAD Cardiovascular:  RRR, No M/R/G Respiratory: Lungs CTAB Gastrointestinal: Abdomen soft, NT/ND with normal active bowel sounds. Extremities: No C/E/C    Discharge Instructions:   Discharge Instructions   Call MD for:  extreme fatigue    Complete by:  As directed      Call MD for:  persistant nausea and vomiting    Complete by:  As directed      Call MD for:  severe uncontrolled pain    Complete by:  As directed      Call MD for:  temperature >100.4    Complete by:  As directed      Diet - low sodium heart healthy    Complete by:  As directed   Low fiber UNTIL your bowels are moving, then you may increase the fiber in your diet.  Fiber is found in plant based  foods such as whole grains, fruits, vegetables, salads.  Fiber may cause bloating, so it is best not to eat large amounts of this until your bowels begin moving after surgery.     Discharge instructions    Complete by:  As directed   You were cared for by Dr. Hillery Aldohristina Marquavius Scaife  (a hospitalist) during your hospital stay. If you have any questions about your discharge  medications or the care you received while you were in the hospital after you are discharged, you can call the unit and ask to speak with the hospitalist on call if the hospitalist that took care of you is not available. Once you are discharged, your primary care physician will handle any further medical issues. Please note that NO REFILLS for any discharge medications will be authorized once you are discharged, as it is imperative that you return to your primary care physician (or establish a relationship with a primary care physician if you do not have one) for your aftercare needs so that they can reassess your need for medications and monitor your lab values.  Any outstanding tests can be reviewed by your PCP at your follow up visit.  It is also important to review any medicine changes with your PCP.  Please bring these d/c instructions with you to your next visit so your physician can review these changes with you.     Discharge wound care:    Complete by:  As directed   You may remove your bandages 24-48 hours after surgery, and you may shower at that time.  You may have steri-strips (small skin tapes) in place directly over the incision.  These strips should be left on the skin for 7-10 days.  If your surgeon used skin glue on the incision, you may shower in 24 hours.  The glue will flake off over the next 2-3 weeks.  Any sutures or staples will be removed at the office during your follow-up visit.     Increase activity slowly    Complete by:  As directed             Medication List         aspirin 81 MG tablet  Take 81 mg by mouth daily.     cetirizine 10 MG tablet  Commonly known as:  ZYRTEC  Take 10 mg by mouth daily.     FLUoxetine 20 MG tablet  Commonly known as:  PROZAC  Take 20 mg by mouth daily.     HYDROcodone-acetaminophen 5-325 MG per tablet  Commonly known as:  NORCO/VICODIN  Take 1-2 tablets by mouth every 4 (four) hours as needed for moderate pain.     mupirocin  ointment 2 %  Commonly known as:  BACTROBAN  Place 1 application into the nose 3 (three) times daily.     ondansetron 4 MG tablet  Commonly known as:  ZOFRAN  Take 1 tablet (4 mg total) by mouth every 6 (six) hours as needed for nausea.           Follow-up Information   Follow up with Velora HecklerGERKIN,TODD M, MD. Schedule an appointment as soon as possible for a visit in 3 weeks. (For wound check)    Specialty:  General Surgery   Contact information:   [address] [place] [address] [phone]       Schedule an appointment as soon as possible for a visit with Cala BradfordWHITE,[name], MD. (As needed)    Specialty:  Family Medicine   Contact information:   [address] [place] [address] [phone]        The results of significant diagnostics from this hospitalization (including imaging, microbiology, ancillary and laboratory) are listed below for reference.     Significant Diagnostic Studies:   Radiographs: Dg Chest 2 View  01/05/2014   CLINICAL DATA:  Short of breath.  Right-sided chest pain.  EXAM: CHEST  2 VIEW  COMPARISON:  09/12/2013.  FINDINGS: Cardiopericardial silhouette within normal limits. Mediastinal contours normal. Trachea midline. No airspace disease or effusion.  IMPRESSION: No active cardiopulmonary disease.   Electronically Signed   By: Andreas Newport M.D.   On: 01/05/2014 17:58   Dg Cholangiogram Operative  01/25/2014   CLINICAL DATA:  Cholecystectomy for cholelithiasis.  EXAM: INTRAOPERATIVE CHOLANGIOGRAM  TECHNIQUE: Cholangiographic images from the C-arm fluoroscopic device were submitted for interpretation post-operatively. Please see the procedural report for the amount of contrast and the fluoroscopy time utilized.  COMPARISON:  CT on 01/08/2014 and abdominal ultrasound on 01/05/2014.  FINDINGS: Intraoperative C-arm imaging demonstrates a normal opacified biliary tree without evidence of obstruction or filling defect. Contrast  enters the duodenum. No extravasation is identified.  IMPRESSION: Normal intraoperative cholangiogram.   Electronically Signed   By: Irish Lack M.D.   On: 01/25/2014 12:42   Ct Abdomen W Contrast  01/08/2014   CLINICAL DATA:  Evaluate biliary tree and pancreas.  Pancreatitis.  EXAM: CT ABDOMEN WITH CONTRAST  TECHNIQUE: Multidetector CT imaging of the abdomen was performed using the standard protocol following bolus administration of intravenous contrast.  CONTRAST:  50mL OMNIPAQUE IOHEXOL 300 MG/ML SOLN, OMNIPAQUE IOHEXOL 300 MG/ML SOLN  COMPARISON:  None.  FINDINGS: No pleural or pericardial effusion identified. The lung bases are clear.  No focal liver abnormality identified. The gallbladder is normal. No biliary dilatation. Mild diffuse edema of the pancreas with mild peripancreatic fat stranding. No evidence for pancreatic necrosis or pseudocyst. No pancreatic duct dilatation. The spleen is normal.  The adrenal glands are normal. Normal appearance of both kidneys. Normal caliber of the abdominal aorta. Gastrohepatic ligament lymph node measures 1.9 cm. Celiac lymph node measures 1.5 cm, image 21/series 2. No periaortic or aortocaval adenopathy.  There is no free fluid or abnormal fluid collections within the upper abdomen. Normal appearance of the stomach. The small bowel loops op appear normal. Normal appearance of the colon.  Review of the visualized bony structures is significant for previous hardware fixation within the lower lumbar spine.  IMPRESSION: 1. Pancreatitis. 2. No complications identified.   Electronically Signed   By: Signa Kell M.D.   On: 01/08/2014 14:28   US Abdomen Complete  01/05/2014   CLINICAL DATA:  *Abdominal pain and elevated LFTs  EXAM: ULTRASOUND ABDOMEN COMPLETE  COMPARISON:  05/24/2010.  FINDINGS: Gallbladder:  No gallstones or wall thickening visualized. No sonographic Murphy sign noted.  Common bile duct:  Diameter: 6 mm.  Liver:  Increase in echogenicity  consistent with fatty infiltration. This is similar to that seen on the prior exam.  IVC:  Not well visualized.  Pancreas:  Not well visualized.  The visualized tail is within normal limits.  Spleen:  Size and appearance within normal limits.  Right Kidney:  Length: 13 cm in length. Echogenicity within normal limits. No mass or hydronephrosis visualized.  Left Kidney:  Length: 13.1 cm in length. Echogenicity within normal limits. No mass or hydronephrosis visualized.  Abdominal aorta:  No aneurysm visualized.  Other  findings:  None.  IMPRESSION: Limited exam due to patient body habitus and overlying bowel gas. No acute abnormality is seen.   Electronically Signed   By: Alcide Clever M.D.   On: 01/05/2014 16:26   Dg Abd Acute W/chest  01/21/2014   CLINICAL DATA:  Nausea, abdominal pain, history of pancreatitis  EXAM: ACUTE ABDOMEN SERIES (ABDOMEN 2 VIEW & CHEST 1 VIEW)  COMPARISON:  01/08/2014, 01/05/2014.  FINDINGS: Cardiomediastinal silhouette is unremarkable. No acute infiltrate or pleural effusion. No pulmonary edema. There is nonspecific nonobstructive bowel gas pattern. Again noted postsurgical changes lumbar spine. Study is limited by patient's large body habitus. No free abdominal air.  IMPRESSION: Negative abdominal radiographs.  No acute cardiopulmonary disease.   Electronically Signed   By: Natasha Mead M.D.   On: 01/21/2014 10:23    Labs:  Basic Metabolic Panel:  Recent Labs Lab 01/23/14 0459 01/24/14 0446 01/25/14 0420 01/26/14 0426 01/27/14 0427  NA 138 136* 139 137 139  K 3.9 3.3* 3.4* 3.5* 3.7  CL 102 100 104 102 101  CO2 26 24 24 26 29   GLUCOSE 91 96 99 125* 113*  BUN 10 8 8 7 7   CREATININE 0.95 0.89 0.88 0.90 1.02  CALCIUM 8.7 8.7 8.4 8.5 8.8   GFR Estimated Creatinine Clearance: 121.5 ml/min (by C-G formula based on Cr of 1.02). Liver Function Tests:  Recent Labs Lab 01/21/14 0812 01/22/14 0540 01/23/14 0459 01/24/14 0446 01/25/14 0420  AST 61* 181* 84* 51* 34    ALT 40 195* 134* 98* 71*  ALKPHOS 83 120* 109 98 78  BILITOT 1.0 3.1* 1.8* 1.5* 1.3*  PROT 8.2 6.9 6.7 7.0 6.5  ALBUMIN 3.5 3.0* 3.0* 3.0* 2.7*    Recent Labs Lab 01/21/14 0812 01/22/14 0540 01/23/14 0459 01/24/14 0446 01/25/14 0420  LIPASE 663* 217* 20 18 15    CBC:  Recent Labs Lab 01/21/14 0812 01/22/14 0540 01/23/14 0459 01/24/14 0446 01/25/14 0420 01/26/14 0426  WBC 6.8 8.7 9.8 7.5 6.3 6.8  NEUTROABS 5.2  --   --   --   --   --   HGB 14.9 14.2 13.7 13.8 12.5* 13.0  HCT 45.3 42.7 42.0 42.4 37.6* 39.9  MCV 87.1 86.8 87.9 87.6 86.8 87.1  PLT 255 247 234 243 236 239   Microbiology Recent Results (from the past 240 hour(s))  MRSA PCR SCREENING     Status: None   Collection Time    01/21/14  2:27 PM      Result Value Ref Range Status   MRSA by PCR NEGATIVE  NEGATIVE Final   Comment:            The GeneXpert MRSA Assay (FDA     approved for NASAL specimens     only), is one component of a     comprehensive MRSA colonization     surveillance program. It is not     intended to diagnose MRSA     infection nor to guide or     monitor treatment for     MRSA infections.  SURGICAL PCR SCREEN     Status: None   Collection Time    01/24/14  9:53 PM      Result Value Ref Range Status   MRSA, PCR NEGATIVE  NEGATIVE Final   Staphylococcus aureus NEGATIVE  NEGATIVE Final   Comment:            The Xpert SA Assay (FDA     approved for NASAL  specimens     in patients over 67 years of age),     is one component of     a comprehensive surveillance     program.  Test performance has     been validated by [facility] for patients greater     than or equal to 67 year old.     It is not intended     to diagnose infection nor to     guide or monitor treatment.     [facility]    Time coordinating discharge: 35 minutes.  Signed:  Mattisen Pohlmann  Pager [phone] [facility] 01/27/2014, 10:07 AM

## 2014-02-15 ENCOUNTER — Encounter (INDEPENDENT_AMBULATORY_CARE_PROVIDER_SITE_OTHER): Payer: Self-pay | Admitting: Surgery

## 2014-02-15 ENCOUNTER — Ambulatory Visit (INDEPENDENT_AMBULATORY_CARE_PROVIDER_SITE_OTHER): Payer: Private Health Insurance - Indemnity | Admitting: Surgery

## 2014-02-15 VITALS — BP 126/82 | HR 68 | Temp 97.4°F | Ht 71.0 in | Wt >= 6400 oz

## 2014-02-15 DIAGNOSIS — K858 Other acute pancreatitis without necrosis or infection: Secondary | ICD-10-CM

## 2014-02-15 DIAGNOSIS — K859 Acute pancreatitis without necrosis or infection, unspecified: Secondary | ICD-10-CM

## 2014-02-15 NOTE — Patient Instructions (Signed)
  CARE OF INCISION   Apply cocoa butter/vitamin E cream ([employer] brand) to your incision 2 - 3 times daily.  Massage cream into incision for one minute with each application.  Use sunscreen (50 SPF or higher) for first 6 months after surgery if area is exposed to sun.  You may alternate Mederma or other scar reducing cream with cocoa butter cream if desired.       Racquel Arkin M. Rekha Hobbins, MD, FACS      [facility]      Office: [phone]    

## 2014-02-15 NOTE — Progress Notes (Signed)
General Surgery [facility]  Chief Complaint  Patient presents with  . Routine Post Op    lap chole on 01/25/2014    HISTORY: Patient is a 68 year old male who underwent laparoscopic cholecystectomy for biliary pancreatitis. Final pathology shows chronic cholecystitis and cholelithiasis with the largest gallstone measuring 1.2 cm.  Postoperatively the patient has done well. His appetite is returning. Bowel movements are still slightly irregular.  EXAM: Abdominal incisions have healed nicely. No sign of infection. No sign of herniation. Right upper quadrant is soft and nontender without mass.  IMPRESSION: Status post laparoscopic cholecystectomy for biliary pancreatitis  PLAN: Patient will begin applying topical creams to his incisions. He is released to full activity without restriction.  Patient will return for surgical care as needed.  [name] M. Lemar Bakos, MD, FACS General & Endocrine Surgery [facility]   Visit Diagnoses: 1. Other pancreatitis

## 2014-04-17 ENCOUNTER — Encounter (HOSPITAL_COMMUNITY): Payer: Self-pay | Admitting: Pharmacy Technician

## 2014-04-19 ENCOUNTER — Other Ambulatory Visit: Payer: Self-pay | Admitting: Neurological Surgery

## 2014-04-19 DIAGNOSIS — M5000 Cervical disc disorder with myelopathy, unspecified cervical region: Secondary | ICD-10-CM

## 2014-04-21 ENCOUNTER — Ambulatory Visit
Admission: RE | Admit: 2014-04-21 | Discharge: 2014-04-21 | Disposition: A | Discharge status: Home / Self Care | Payer: Managed Care, Other (non HMO) | Source: Ambulatory Visit | Attending: Neurological Surgery | Admitting: Neurological Surgery

## 2014-04-21 DIAGNOSIS — M5000 Cervical disc disorder with myelopathy, unspecified cervical region: Secondary | ICD-10-CM

## 2014-04-26 ENCOUNTER — Encounter (HOSPITAL_COMMUNITY): Admission: RE | Payer: Self-pay | Source: Ambulatory Visit

## 2014-04-26 ENCOUNTER — Ambulatory Visit (HOSPITAL_COMMUNITY)
Admission: RE | Admit: 2014-04-26 | Payer: Managed Care, Other (non HMO) | Source: Ambulatory Visit | Admitting: Gastroenterology

## 2014-04-26 SURGERY — ESOPHAGEAL ENDOSCOPIC ULTRASOUND (EUS) RADIAL
Anesthesia: Monitor Anesthesia Care

## 2014-11-29 ENCOUNTER — Other Ambulatory Visit: Payer: Self-pay | Admitting: Family Medicine

## 2014-11-29 ENCOUNTER — Ambulatory Visit
Admission: RE | Admit: 2014-11-29 | Discharge: 2014-11-29 | Disposition: A | Discharge status: Home / Self Care | Payer: Medicare Other | Source: Ambulatory Visit | Attending: Family Medicine | Admitting: Family Medicine

## 2014-11-29 DIAGNOSIS — E291 Testicular hypofunction: Secondary | ICD-10-CM | POA: Diagnosis not present

## 2014-11-29 DIAGNOSIS — Z6841 Body Mass Index (BMI) 40.0 and over, adult: Secondary | ICD-10-CM | POA: Diagnosis not present

## 2014-11-29 DIAGNOSIS — R05 Cough: Secondary | ICD-10-CM

## 2014-11-29 DIAGNOSIS — M5030 Other cervical disc degeneration, unspecified cervical region: Secondary | ICD-10-CM | POA: Diagnosis not present

## 2014-11-29 DIAGNOSIS — R059 Cough, unspecified: Secondary | ICD-10-CM

## 2014-11-29 DIAGNOSIS — F324 Major depressive disorder, single episode, in partial remission: Secondary | ICD-10-CM | POA: Diagnosis not present

## 2014-11-29 DIAGNOSIS — N451 Epididymitis: Secondary | ICD-10-CM | POA: Diagnosis not present

## 2014-11-29 DIAGNOSIS — R0602 Shortness of breath: Secondary | ICD-10-CM | POA: Diagnosis not present

## 2015-03-15 DIAGNOSIS — F322 Major depressive disorder, single episode, severe without psychotic features: Secondary | ICD-10-CM | POA: Diagnosis not present

## 2015-03-15 DIAGNOSIS — Z23 Encounter for immunization: Secondary | ICD-10-CM | POA: Diagnosis not present

## 2015-03-15 DIAGNOSIS — I8310 Varicose veins of unspecified lower extremity with inflammation: Secondary | ICD-10-CM | POA: Diagnosis not present

## 2015-03-15 DIAGNOSIS — R05 Cough: Secondary | ICD-10-CM | POA: Diagnosis not present

## 2015-03-15 DIAGNOSIS — M25551 Pain in right hip: Secondary | ICD-10-CM | POA: Diagnosis not present

## 2015-03-29 DIAGNOSIS — Z13220 Encounter for screening for lipoid disorders: Secondary | ICD-10-CM | POA: Diagnosis not present

## 2015-05-16 DIAGNOSIS — H109 Unspecified conjunctivitis: Secondary | ICD-10-CM | POA: Diagnosis not present

## 2015-07-01 IMAGING — CR DG CHEST 2V
2 series · 2 of 2 positions shown · non-contrast
Comparison: None.

CLINICAL DATA: Shortness of breath.

EXAM:
CHEST  2 VIEW

[w chest pa]
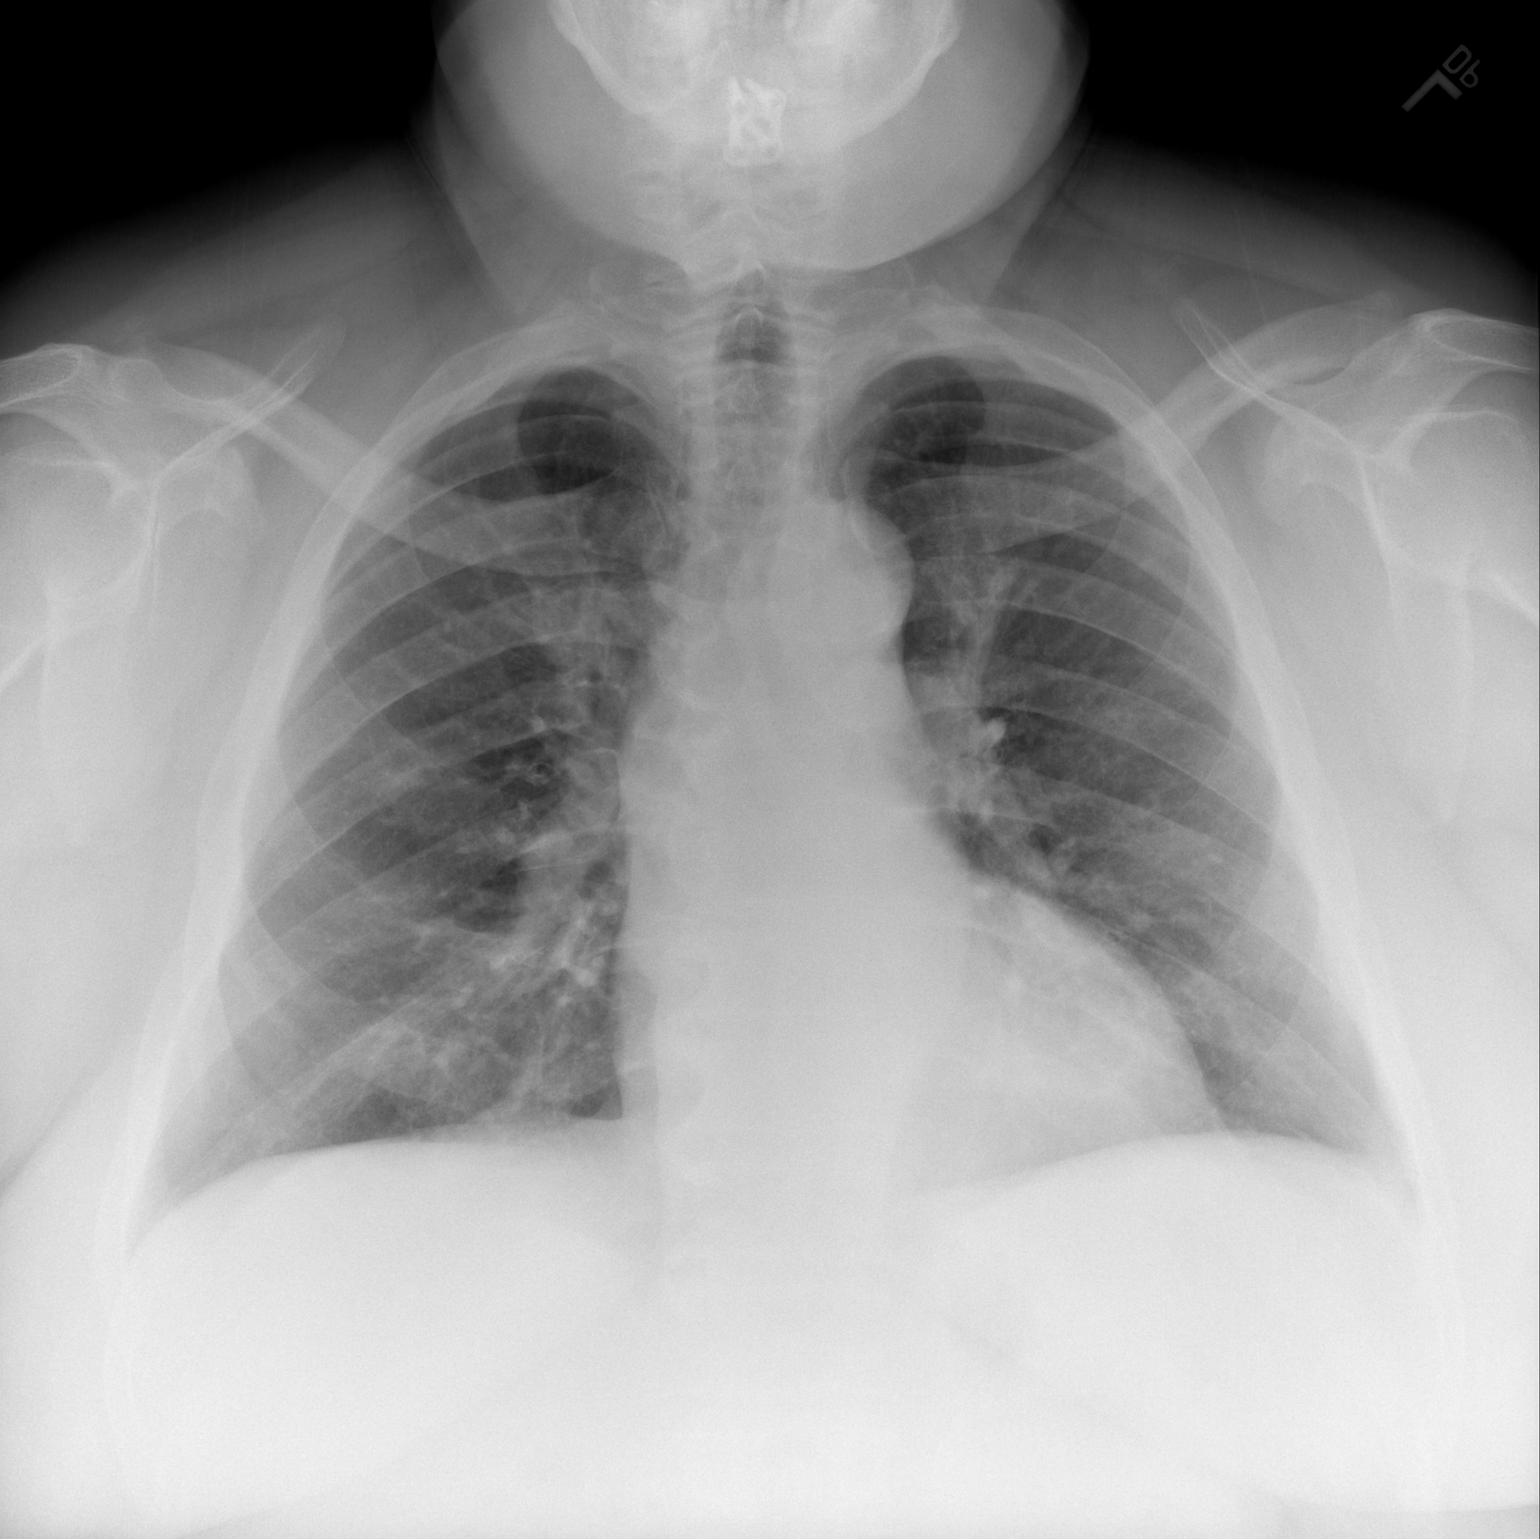

[w chest lat]
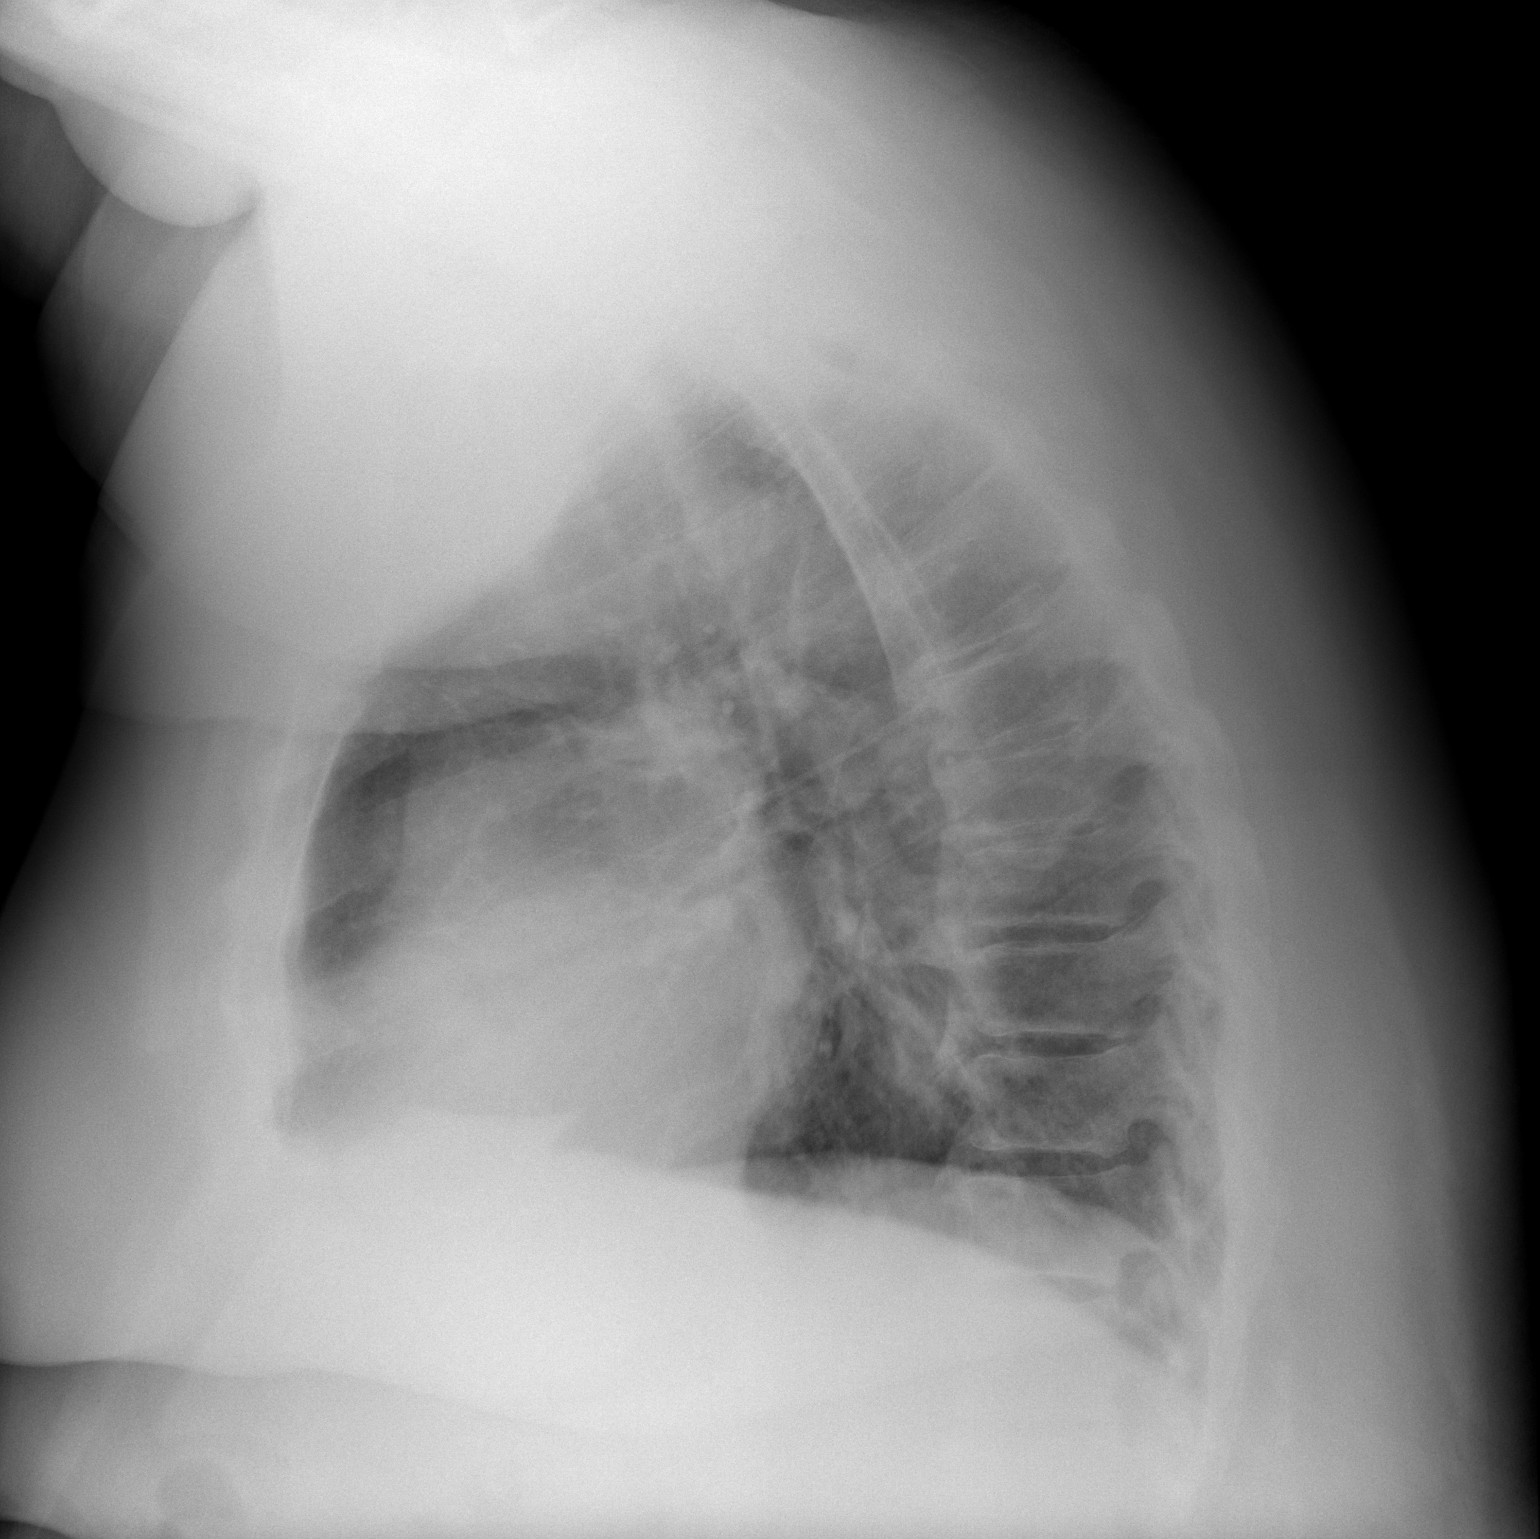

[2 of 2 positions shown; findings below may reference images not displayed]

FINDINGS: The heart size and mediastinal contours are within normal limits.
Both lungs are clear of acute process. Mild bronchitic changes. .
The visualized skeletal structures are unremarkable.
IMPRESSION: Mild bronchitic changes but no acute infiltrates

## 2015-10-27 IMAGING — CT CT ABDOMEN W/ CM
2 of 7 series · 14 of 46 positions shown, 19 images · IV contrast (omnipaque)
Comparison: None.

CLINICAL DATA: Evaluate biliary tree and pancreas.  Pancreatitis.

EXAM:
CT ABDOMEN WITH CONTRAST
TECHNIQUE: Multidetector CT imaging of the abdomen was performed using the
standard protocol following bolus administration of intravenous
contrast.
CONTRAST:  50mL OMNIPAQUE IOHEXOL 300 MG/ML SOLN, 125mL OMNIPAQUE
IOHEXOL 300 MG/ML SOLN

[Series 2: rtn a/p with · axial · 0.94mm/px · z∈[-304,-64]mm · 11 of 58 slices shown, 16 images]
[im 5/58  soft-tissue]
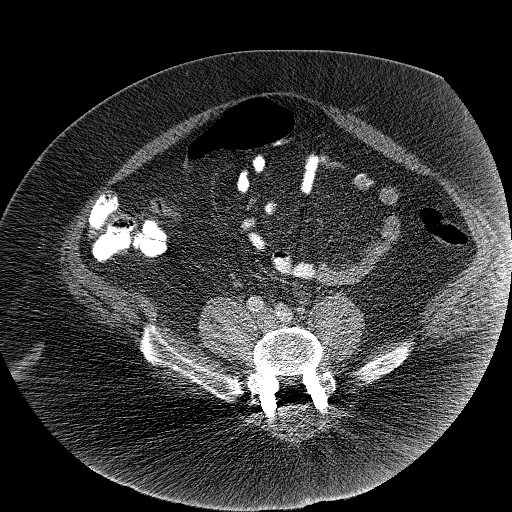
[im 5/58  bone]
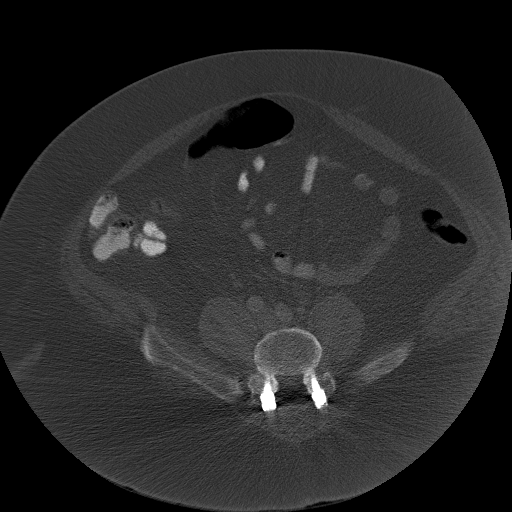
[im 9/58  soft-tissue]
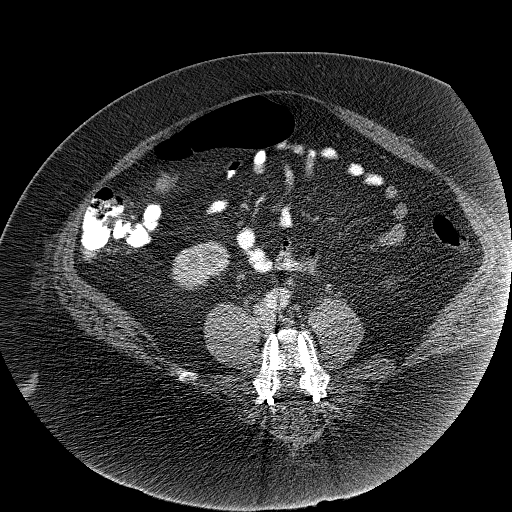
[im 18/58  soft-tissue]
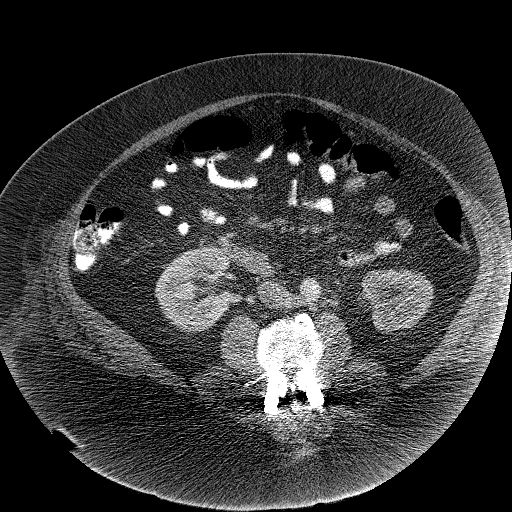
[im 22/58  soft-tissue]
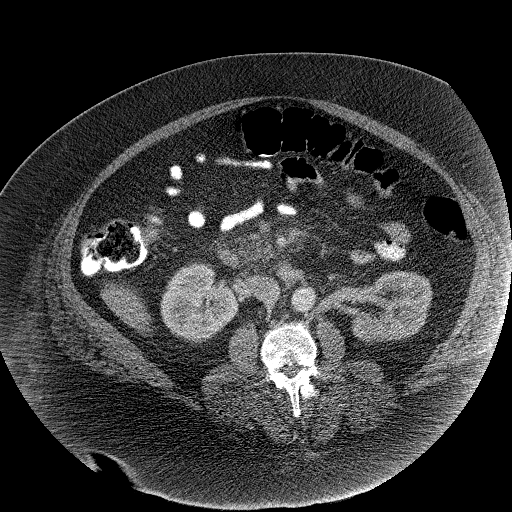
[im 27/58  soft-tissue]
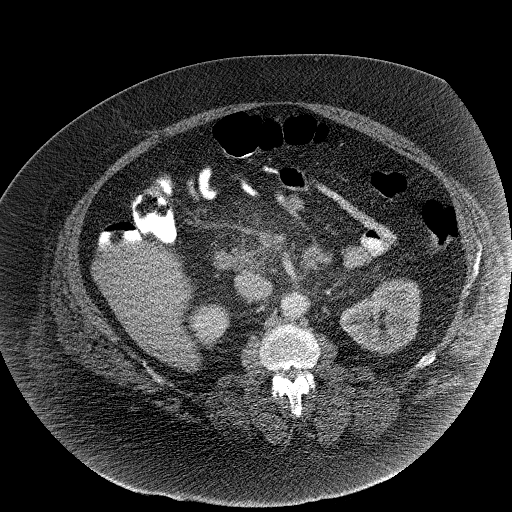
[im 31/58  soft-tissue]
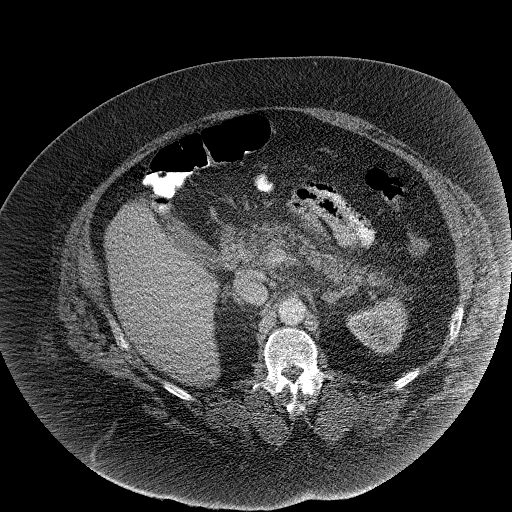
[im 36/58  soft-tissue]
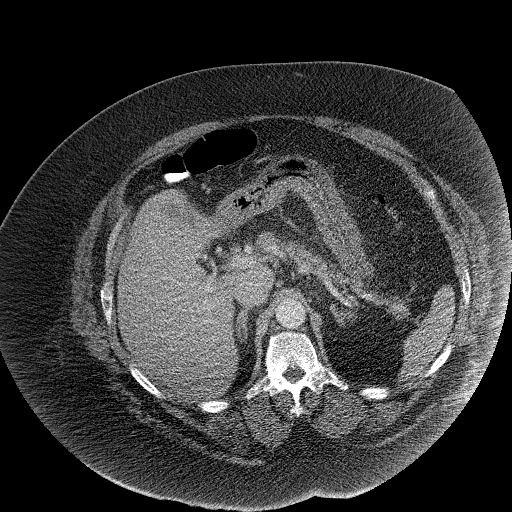
[im 40/58  lung]
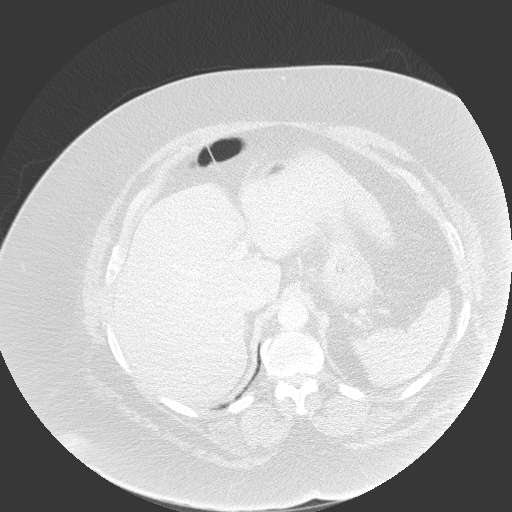
[im 44/58  soft-tissue]
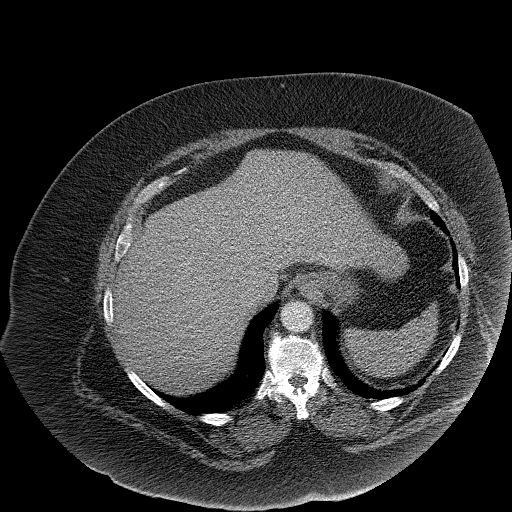
[im 44/58  lung]
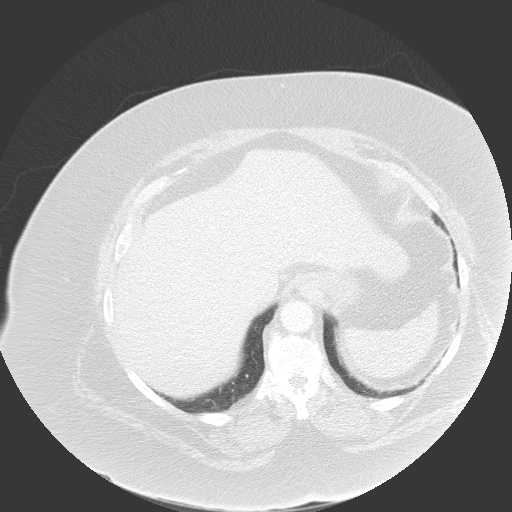
[im 49/58  soft-tissue]
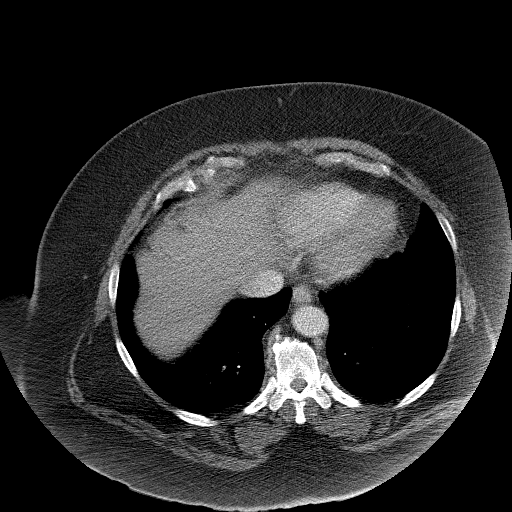
[im 49/58  lung]
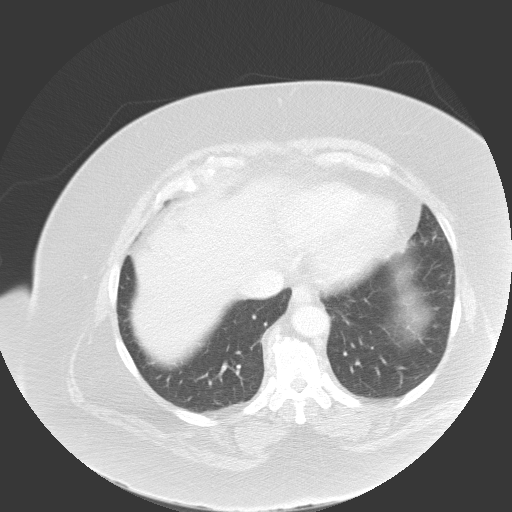
[im 49/58  bone]
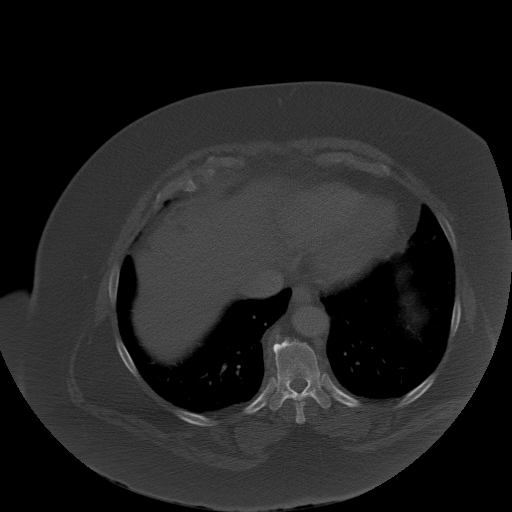
[im 53/58  soft-tissue]
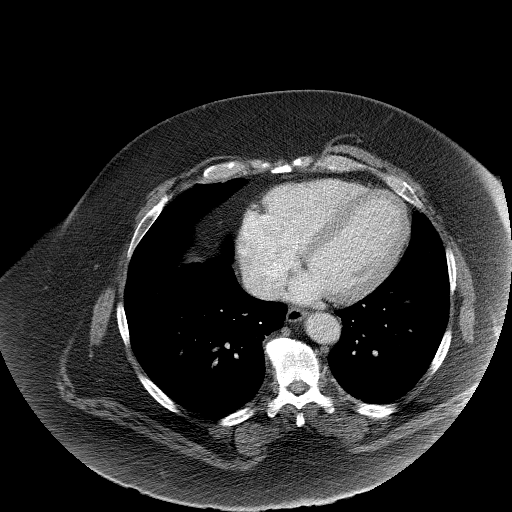
[im 53/58  lung]
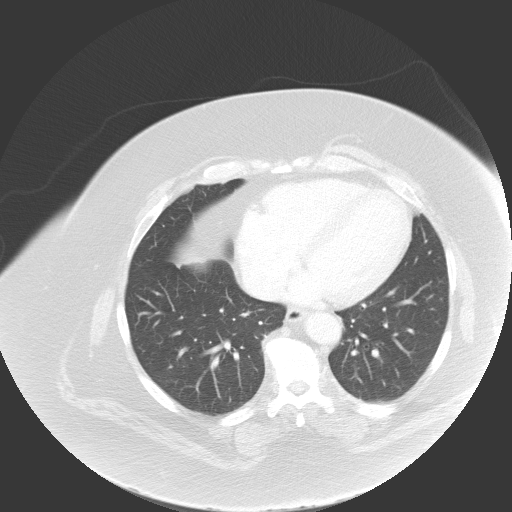

[Series 602: <mpr thick range> · coronal · 0.94mm/px · 3 of 212 slices shown]
[im 53/212  soft-tissue]
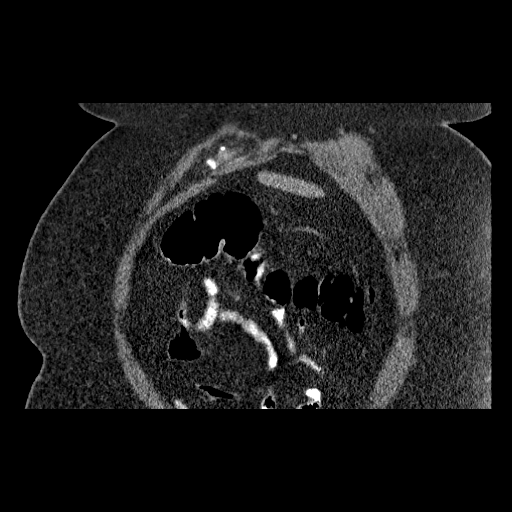
[im 106/212  soft-tissue]
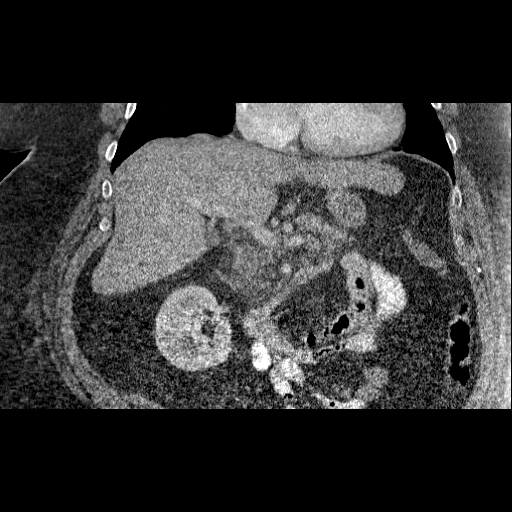
[im 159/212  soft-tissue]
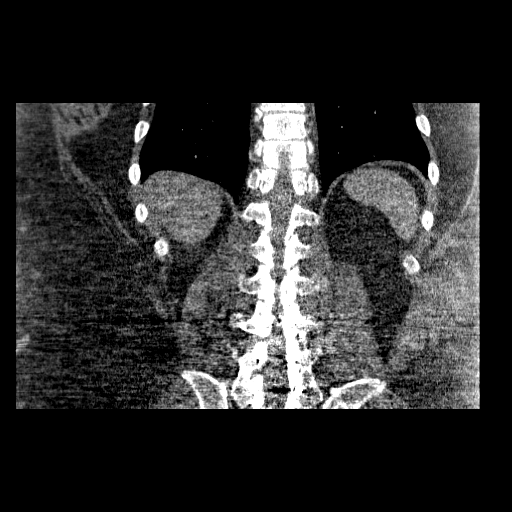

[14 of 46 positions shown; findings below may reference images not displayed]

FINDINGS: No pleural or pericardial effusion identified. The lung bases are
clear.

No focal liver abnormality identified. The gallbladder is normal. No
biliary dilatation. Mild diffuse edema of the pancreas with mild
peripancreatic fat stranding. No evidence for pancreatic necrosis or
pseudocyst. No pancreatic duct dilatation. The spleen is normal.

The adrenal glands are normal. Normal appearance of both kidneys.
Normal caliber of the abdominal aorta. Gastrohepatic ligament lymph
node measures 1.9 cm. Celiac lymph node measures 1.5 cm, image
21/series 2. No periaortic or aortocaval adenopathy.

There is no free fluid or abnormal fluid collections within the
upper abdomen. Normal appearance of the stomach. The small bowel
loops op appear normal. Normal appearance of the colon.

Review of the visualized bony structures is significant for previous
hardware fixation within the lower lumbar spine.
IMPRESSION: 1. Pancreatitis.
2. No complications identified.

## 2015-10-30 DIAGNOSIS — L0211 Cutaneous abscess of neck: Secondary | ICD-10-CM | POA: Diagnosis not present

## 2015-11-05 DIAGNOSIS — G4733 Obstructive sleep apnea (adult) (pediatric): Secondary | ICD-10-CM | POA: Diagnosis not present

## 2015-11-29 DIAGNOSIS — G4733 Obstructive sleep apnea (adult) (pediatric): Secondary | ICD-10-CM | POA: Diagnosis not present

## 2016-02-20 ENCOUNTER — Emergency Department (HOSPITAL_COMMUNITY): Payer: Managed Care, Other (non HMO)

## 2016-02-20 ENCOUNTER — Inpatient Hospital Stay (HOSPITAL_COMMUNITY)
Admission: EM | Admit: 2016-02-20 | Discharge: 2016-02-24 | DRG: 175 | Disposition: A | Discharge status: Home / Self Care | Payer: Managed Care, Other (non HMO) | Attending: Internal Medicine | Admitting: Internal Medicine

## 2016-02-20 ENCOUNTER — Encounter (HOSPITAL_COMMUNITY): Payer: Self-pay | Admitting: Emergency Medicine

## 2016-02-20 DIAGNOSIS — I2609 Other pulmonary embolism with acute cor pulmonale: Secondary | ICD-10-CM | POA: Diagnosis not present

## 2016-02-20 DIAGNOSIS — I272 Other secondary pulmonary hypertension: Secondary | ICD-10-CM | POA: Diagnosis present

## 2016-02-20 DIAGNOSIS — I2602 Saddle embolus of pulmonary artery with acute cor pulmonale: Secondary | ICD-10-CM | POA: Diagnosis not present

## 2016-02-20 DIAGNOSIS — J9601 Acute respiratory failure with hypoxia: Secondary | ICD-10-CM | POA: Diagnosis present

## 2016-02-20 DIAGNOSIS — I5032 Chronic diastolic (congestive) heart failure: Secondary | ICD-10-CM | POA: Diagnosis present

## 2016-02-20 DIAGNOSIS — J302 Other seasonal allergic rhinitis: Secondary | ICD-10-CM | POA: Diagnosis present

## 2016-02-20 DIAGNOSIS — I110 Hypertensive heart disease with heart failure: Secondary | ICD-10-CM | POA: Diagnosis present

## 2016-02-20 DIAGNOSIS — K219 Gastro-esophageal reflux disease without esophagitis: Secondary | ICD-10-CM | POA: Diagnosis present

## 2016-02-20 DIAGNOSIS — R079 Chest pain, unspecified: Secondary | ICD-10-CM | POA: Diagnosis not present

## 2016-02-20 DIAGNOSIS — I2699 Other pulmonary embolism without acute cor pulmonale: Secondary | ICD-10-CM | POA: Diagnosis present

## 2016-02-20 DIAGNOSIS — Z6841 Body Mass Index (BMI) 40.0 and over, adult: Secondary | ICD-10-CM | POA: Diagnosis not present

## 2016-02-20 DIAGNOSIS — Z86718 Personal history of other venous thrombosis and embolism: Secondary | ICD-10-CM

## 2016-02-20 DIAGNOSIS — G4733 Obstructive sleep apnea (adult) (pediatric): Secondary | ICD-10-CM | POA: Diagnosis present

## 2016-02-20 DIAGNOSIS — R0602 Shortness of breath: Secondary | ICD-10-CM

## 2016-02-20 LAB — BASIC METABOLIC PANEL
Anion gap: 7 (ref 5–15)
BUN: 14 mg/dL (ref 6–20)
CALCIUM: 9.1 mg/dL (ref 8.9–10.3)
CHLORIDE: 110 mmol/L (ref 101–111)
CO2: 22 mmol/L (ref 22–32)
CREATININE: 1.15 mg/dL (ref 0.61–1.24)
GFR calc Af Amer: >60 mL/min (ref >60)
GFR calc non Af Amer: >60 mL/min (ref >60)
Glucose, Bld: 109 mg/dL — ABNORMAL HIGH (ref 65–99)
Potassium: 3.7 mmol/L (ref 3.5–5.1)
Sodium: 139 mmol/L (ref 135–145)

## 2016-02-20 LAB — CBC
HCT: 44.6 % (ref 39.0–52.0)
Hemoglobin: 15.1 g/dL (ref 13.0–17.0)
MCH: 29.7 pg (ref 26.0–34.0)
MCHC: 33.9 g/dL (ref 30.0–36.0)
MCV: 87.6 fL (ref 78.0–100.0)
PLATELETS: 188 10*3/uL (ref 150–400)
RBC: 5.09 MIL/uL (ref 4.22–5.81)
RDW: 13.7 % (ref 11.5–15.5)
WBC: 7.7 10*3/uL (ref 4.0–10.5)

## 2016-02-20 LAB — PROTIME-INR
INR: 1.11
Prothrombin Time: 14.3 seconds (ref 11.4–15.2)

## 2016-02-20 LAB — I-STAT TROPONIN, ED: TROPONIN I, POC: 0.04 ng/mL (ref 0.00–0.08)

## 2016-02-20 LAB — APTT: aPTT: 32 seconds (ref 24–36)

## 2016-02-20 MED ORDER — IOPAMIDOL (ISOVUE-370) INJECTION 76%
100.0000 mL | Freq: Once | INTRAVENOUS | Status: AC | PRN
  Administered 2016-02-20: 100 mL via INTRAVENOUS

## 2016-02-20 MED ORDER — FAMOTIDINE 20 MG PO TABS
10.0000 mg | ORAL_TABLET | Freq: Every day | ORAL | Status: DC
Start: 2016-02-21 — End: 2016-02-24
  Administered 2016-02-21 – 2016-02-24 (×4): 10 mg via ORAL
  Filled 2016-02-20 (×4): qty 1

## 2016-02-20 MED ORDER — ASPIRIN EC 81 MG PO TBEC
81.0000 mg | DELAYED_RELEASE_TABLET | Freq: Every day | ORAL | Status: DC
  Administered 2016-02-21 – 2016-02-24 (×4): 81 mg via ORAL
  Filled 2016-02-20 (×4): qty 1

## 2016-02-20 MED ORDER — HEPARIN (PORCINE) IN NACL 100-0.45 UNIT/ML-% IJ SOLN
1900.0000 [IU]/h | INTRAMUSCULAR | Status: DC
  Administered 2016-02-20 – 2016-02-21 (×2): 1900 [IU]/h via INTRAVENOUS
  Filled 2016-02-20 (×3): qty 250

## 2016-02-20 MED ORDER — IPRATROPIUM-ALBUTEROL 0.5-2.5 (3) MG/3ML IN SOLN
3.0000 mL | Freq: Once | RESPIRATORY_TRACT | Status: AC
  Administered 2016-02-20: 3 mL via RESPIRATORY_TRACT
  Filled 2016-02-20: qty 3

## 2016-02-20 MED ORDER — HYDROCODONE-ACETAMINOPHEN 5-325 MG PO TABS
1.0000 | ORAL_TABLET | ORAL | Status: DC | PRN
  Administered 2016-02-22 (×2): 2 via ORAL
  Administered 2016-02-23: 1 via ORAL
  Filled 2016-02-20 (×3): qty 2

## 2016-02-20 MED ORDER — LORATADINE 10 MG PO TABS
10.0000 mg | ORAL_TABLET | Freq: Every day | ORAL | Status: DC
  Administered 2016-02-21 – 2016-02-24 (×4): 10 mg via ORAL
  Filled 2016-02-20 (×4): qty 1

## 2016-02-20 MED ORDER — HEPARIN BOLUS VIA INFUSION
6000.0000 [IU] | Freq: Once | INTRAVENOUS | Status: AC
  Administered 2016-02-20: 6000 [IU] via INTRAVENOUS
  Filled 2016-02-20: qty 6000

## 2016-02-20 NOTE — ED Provider Notes (Signed)
[facility]-EMERGENCY DEPT Provider Note   CSN: [id number] Arrival date & time: 02/20/16  1718  First Provider Contact:  None       History   Chief Complaint Chief Complaint  Patient presents with  . Shortness of Breath  . Chest Pain  . Cough    HPI Richard Sandoval is a 70 y.o. male.  HPI   Pt is a 70 year odl with morbid obesity presenting with cough and SOB for the last several months, woresnig over months. Patient reports that he gets SOB with short distances and has been coughing for months.  Patient drives for fun, usually < 2 hours. Chest pains with deep breaths- No pain to shoulders/jaw.  No recent surgerys  Ho DVT. HTN, OSA.   Past Medical History:  Diagnosis Date  . DVT (deep venous thrombosis) (HCC)    L leg in 2009  . Hypertension    enlarged heart  . Morbid obesity (HCC)   . OSA (obstructive sleep apnea)    on CPAP since 1995  . Pancreatitis     Patient Active Problem List   Diagnosis Date Noted  . Hypokalemia 01/25/2014  . Pancreatitis, gallstone 01/25/2014  . Morbid obesity (HCC)   . Pancreatitis 01/05/2014  . Abnormal transaminases 01/05/2014  . Dyspnea 12/15/2013  . Upper airway cough syndrome 09/27/2013  . Cellulitis and abscess 05/10/2012  . OBESITY, NOS 09/24/2006  . DEPRESSIVE DISORDER, NOS 09/24/2006  . BACK PAIN, LOW 09/24/2006  . OSA (obstructive sleep apnea) 09/24/2006    Past Surgical History:  Procedure Laterality Date  . BACK SURGERY  04/09/1999  . cervical laminectomy and fusion    . CHOLECYSTECTOMY N/A 01/25/2014   Procedure: LAPAROSCOPIC CHOLECYSTECTOMY WITH INTRAOPERATIVE CHOLANGIOGRAM;  Surgeon: Velora Heckler, MD;  Location: [facility];  Service: General;  Laterality: N/A;  . EUS N/A 01/24/2014   Procedure: UPPER ENDOSCOPIC ULTRASOUND (EUS) RADIAL;  Surgeon: Willis Modena, MD;  Location: [facility];  Service: Endoscopy;  Laterality: N/A;  . KNEE SURGERY  1998  . left inguinal hernia    . LUMBAR LAMINECTOMY    . TRIGGER FINGER  RELEASE  08/04/2012   Procedure: MINOR RELEASE TRIGGER FINGER/A-1 PULLEY;  Surgeon: Nicki Reaper, MD;  Location: [facility];  Service: Orthopedics;  Laterality: Right;       Home Medications    Prior to Admission medications   Medication Sig Start Date End Date Taking? Authorizing Provider  aspirin EC 81 MG tablet Take 81 mg by mouth daily.   Yes Historical Provider, MD  cetirizine (ZYRTEC) 10 MG tablet Take 10 mg by mouth daily as needed for allergies.    Yes Historical Provider, MD  HYDROcodone-acetaminophen (NORCO/VICODIN) 5-325 MG per tablet Take 1-2 tablets by mouth every 4 (four) hours as needed for moderate pain. 01/27/14  Yes Christina P Rama, MD  ranitidine (ZANTAC) 150 MG tablet Take 150 mg by mouth at bedtime.   Yes Historical Provider, MD    Family History Family History  Problem Relation Age of Onset  . Breast cancer Mother     Social History Social History  Substance Use Topics  . Smoking status: Never Smoker  . Smokeless tobacco: Never Used  . Alcohol use No     Allergies   Penicillins   Review of Systems Review of Systems  Constitutional: Positive for fatigue. Negative for activity change.  HENT: Negative for congestion.   Respiratory: Positive for cough and shortness of breath.   Cardiovascular: Negative for chest  pain, palpitations and leg swelling.  Gastrointestinal: Negative for abdominal pain.  Genitourinary: Negative for difficulty urinating.  Neurological: Negative for dizziness.  Psychiatric/Behavioral: Negative for behavioral problems.  All other systems reviewed and are negative.    Physical Exam Updated Vital Signs BP 142/94 (BP Location: Left Wrist)   Pulse 100   Temp 98.2 F (36.8 C)   Resp 13   Ht 5\' 11"  (1.803 m)   Wt (!) 445 lb (201.9 kg)   SpO2 94%   BMI 62.06 kg/m   Physical Exam  Constitutional: He appears well-developed and well-nourished.  Morbid obesity  HENT:  Head: Normocephalic and atraumatic.    Eyes: Conjunctivae are normal.  Neck: Neck supple.  Cardiovascular: Normal rate and regular rhythm.   No murmur heard. Pulmonary/Chest: Effort normal. No respiratory distress. He has wheezes.  Scattered wheezes R base  Abdominal: Soft. There is no tenderness.  Musculoskeletal: He exhibits no edema.  Neurological: He is alert.  Skin: Skin is warm and dry.  Psychiatric: He has a normal mood and affect.  Nursing note and vitals reviewed.    ED Treatments / Results  Labs (all labs ordered are listed, but only abnormal results are displayed) Labs Reviewed  BASIC METABOLIC PANEL - Abnormal; Notable for the following:       Result Value   Glucose, Bld 109 (*)    All other components within normal limits  CBC  I-STAT TROPOININ, ED    EKG  EKG Interpretation  Date/Time:  Wednesday February 20 2016 17:25:29 EDT Ventricular Rate:  90 PR Interval:    QRS Duration: 104 QT Interval:  388 QTC Calculation: 475 R Axis:   -34 Text Interpretation:  Sinus rhythm Atrial premature complexes Left axis deviation Nonspecific T abnrm, anterolateral leads since last tracing no significant change Confirmed by Effie Shy  MD, ELLIOTT ([id number]) on 02/20/2016 7:01:27 PM       Radiology Dg Chest 2 View  Result Date: 02/20/2016 CLINICAL DATA:  Left-sided chest pain, shortness of breath for 4 days EXAM: CHEST  2 VIEW COMPARISON:  11/29/2014 FINDINGS: The heart size and mediastinal contours are within normal limits. Both lungs are clear. The visualized skeletal structures are unremarkable. IMPRESSION: No active cardiopulmonary disease. Electronically Signed   By: Elige Ko   On: 02/20/2016 18:05   Procedures Procedures (including critical care time)  Medications Ordered in ED Medications - No data to display   Initial Impression / Assessment and Plan / ED Course  I have reviewed the triage vital signs and the nursing notes.  Pertinent labs & imaging results that were available during my care of the  patient were reviewed by me and considered in my medical decision making (see chart for details).  Clinical Course    70 year odl with morbid obesity, ho DVT, OSA presenting with cough and SOB for the last several months. Chest xray normal and vital signs normal and labs nromal.  Will get PE study given ho DVT, and tachycardia,.  Will ambultory pulse ox.     Final Clinical Impressions(s) / ED Diagnoses   Final diagnoses:  Shortness of breath    New Prescriptions New Prescriptions   No medications on file    Large PE, discussed with crit care. Recc admit to hospitlist.  CRITICAL CARE Performed by: Arlana Hove Total critical care time: 30 minutes Critical care time was exclusive of separately billable procedures and treating other patients. Critical care was necessary to treat or prevent imminent or life-threatening  deterioration. Critical care was time spent personally by me on the following activities: development of treatment plan with patient and/or surrogate as well as nursing, discussions with consultants, evaluation of patient's response to treatment, examination of patient, obtaining history from patient or surrogate, ordering and performing treatments and interventions, ordering and review of laboratory studies, ordering and review of radiographic studies, pulse oximetry and re-evaluation of patient's condition.   Courteney Randall An, MD 02/20/16 2255

## 2016-02-20 NOTE — ED Triage Notes (Signed)
Pt c/o left sided chest pain and SOB x4 days. Denies pain radiation and N/V. Pt also reports a dry cough x2 months.

## 2016-02-20 NOTE — ED Provider Notes (Signed)
[facility]-EMERGENCY DEPT Provider Note   CSN: [id number] Arrival date & time: 02/20/16  1718  First Provider Contact:  None       History   Chief Complaint Chief Complaint  Patient presents with  . Shortness of Breath  . Chest Pain  . Cough    HPI Macallan Ord is a 70 y.o. male.  HPI   Patient is a 70 year old male presenting with cough and short of breath. Patient unable to walk further than 4-5 steps and short of breath. He says been progressively worse over the last several months  Past Medical History:  Diagnosis Date  . DVT (deep venous thrombosis) (HCC)    L leg in 2009  . Hypertension    enlarged heart  . Morbid obesity (HCC)   . OSA (obstructive sleep apnea)    on CPAP since 1995  . Pancreatitis     Patient Active Problem List   Diagnosis Date Noted  . Hypokalemia 01/25/2014  . Pancreatitis, gallstone 01/25/2014  . Morbid obesity (HCC)   . Pancreatitis 01/05/2014  . Abnormal transaminases 01/05/2014  . Dyspnea 12/15/2013  . Upper airway cough syndrome 09/27/2013  . Cellulitis and abscess 05/10/2012  . OBESITY, NOS 09/24/2006  . DEPRESSIVE DISORDER, NOS 09/24/2006  . BACK PAIN, LOW 09/24/2006  . OSA (obstructive sleep apnea) 09/24/2006    Past Surgical History:  Procedure Laterality Date  . BACK SURGERY  04/09/1999  . cervical laminectomy and fusion    . CHOLECYSTECTOMY N/A 01/25/2014   Procedure: LAPAROSCOPIC CHOLECYSTECTOMY WITH INTRAOPERATIVE CHOLANGIOGRAM;  Surgeon: Velora Heckler, MD;  Location: [facility];  Service: General;  Laterality: N/A;  . EUS N/A 01/24/2014   Procedure: UPPER ENDOSCOPIC ULTRASOUND (EUS) RADIAL;  Surgeon: Willis Modena, MD;  Location: [facility];  Service: Endoscopy;  Laterality: N/A;  . KNEE SURGERY  1998  . left inguinal hernia    . LUMBAR LAMINECTOMY    . TRIGGER FINGER RELEASE  08/04/2012   Procedure: MINOR RELEASE TRIGGER FINGER/A-1 PULLEY;  Surgeon: Nicki Reaper, MD;  Location: [facility];  Service:  Orthopedics;  Laterality: Right;       Home Medications    Prior to Admission medications   Medication Sig Start Date End Date Taking? Authorizing Provider  aspirin EC 81 MG tablet Take 81 mg by mouth daily.   Yes Historical Provider, MD  cetirizine (ZYRTEC) 10 MG tablet Take 10 mg by mouth daily as needed for allergies.    Yes Historical Provider, MD  HYDROcodone-acetaminophen (NORCO/VICODIN) 5-325 MG per tablet Take 1-2 tablets by mouth every 4 (four) hours as needed for moderate pain. 01/27/14  Yes Christina P Rama, MD  ranitidine (ZANTAC) 150 MG tablet Take 150 mg by mouth at bedtime.   Yes Historical Provider, MD    Family History Family History  Problem Relation Age of Onset  . Breast cancer Mother     Social History Social History  Substance Use Topics  . Smoking status: Never Smoker  . Smokeless tobacco: Never Used  . Alcohol use No     Allergies   Penicillins   Review of Systems Review of Systems   Physical Exam Updated Vital Signs BP 142/94 (BP Location: Left Wrist)   Pulse 100   Temp 98.2 F (36.8 C)   Resp 13   Ht  (1.803 m)   Wt (!) 445 lb (201.9 kg)   SpO2 94%   BMI 62.06 kg/m   Physical Exam   ED Treatments /  Results  Labs (all labs ordered are listed, but only abnormal results are displayed) Labs Reviewed  BASIC METABOLIC PANEL - Abnormal; Notable for the following:       Result Value   Glucose, Bld 109 (*)    All other components within normal limits  CBC  I-STAT TROPOININ, ED    EKG  EKG Interpretation  Date/Time:  Wednesday February 20 2016 17:25:29 EDT Ventricular Rate:  90 PR Interval:    QRS Duration: 104 QT Interval:  388 QTC Calculation: 475 R Axis:   -34 Text Interpretation:  Sinus rhythm Atrial premature complexes Left axis deviation Nonspecific T abnrm, anterolateral leads since last tracing no significant change Confirmed by Effie Shy  MD, ELLIOTT [phone]) on 02/20/2016 7:01:27 PM       Radiology Dg Chest 2  View  Result Date: 02/20/2016 CLINICAL DATA:  Left-sided chest pain, shortness of breath for 4 days EXAM: CHEST  2 VIEW COMPARISON:  11/29/2014 FINDINGS: The heart size and mediastinal contours are within normal limits. Both lungs are clear. The visualized skeletal structures are unremarkable. IMPRESSION: No active cardiopulmonary disease. Electronically Signed   By: Elige Ko   On: 02/20/2016 18:05   Procedures Procedures (including critical care time)  Medications Ordered in ED Medications - No data to display   Initial Impression / Assessment and Plan / ED Course  I have reviewed the triage vital signs and the nursing notes.  Pertinent labs & imaging results that were available during my care of the patient were reviewed by me and considered in my medical decision making (see chart for details).  Clinical Course      Final Clinical Impressions(s) / ED Diagnoses   Final diagnoses:  Shortness of breath    New Prescriptions New Prescriptions   No medications on file     Jackeline Gutknecht Randall An, MD 02/20/16 2355

## 2016-02-20 NOTE — H&P (Signed)
History and Physical  Richard Sandoval ZOX:[id number] DOB: 11/02/1945 DOA: 02/20/2016  PCP:  Cala Bradford, MD   Chief Complaint:  Dyspnea  History of Present Illness:  Patient is a 70 yo male with history of morbid obesity, DVT tx with coumadin in 2009, OSA on CPAP, who came with cc of cough, dyspnea on exertion for a week with new onset chest pain today mainly with cough and deep breathing. No increasing swelling in LEs or pain. No recent travel or surgery. He ambulates as tolerated but spend most of his time sedentary. He has hx of DVT that was treated for coumadin that was stopped in 2009. No history of GI bleed or bleeding disorder.   Review of Systems:  CONSTITUTIONAL:     No night sweats.  No fatigue.  No fever. No chills. Eyes:                            No visual changes.  No eye pain.  No eye discharge.   ENT:                              No epistaxis.  No sinus pain.  No sore throat.   No congestion. RESPIRATORY:           +cough.  No wheeze.  No hemoptysis.  +dyspnea CARDIOVASCULAR   :  +chest pains.  No palpitations. GASTROINTESTINAL:  No abdominal pain.  No nausea. No vomiting.  No diarrhea. No constipation.  No hematemesis.  No hematochezia.  No melena. GENITOURINARY:      No urgency.  No frequency.  No dysuria.  No hematuria.  No obstructive symptoms.  No discharge.  No pain.  MUSCULOSKELETAL:  No musculoskeletal pain.  No joint swelling.  No arthritis. NEUROLOGICAL:        No confusion.  No weakness. No headache. No seizure. PSYCHIATRIC:             No depression. No anxiety. No suicidal ideation. SKIN:                             No rashes.  No lesions.  No wounds. ENDOCRINE:                No weight loss.  No polydipsia.  No polyuria.  No polyphagia. HEMATOLOGIC:           No purpura.  No petechiae.  No bleeding.  ALLERGIC                 : No pruritus.  No angioedema Other:  Past Medical and Surgical History:   Past Medical History:  Diagnosis Date  . DVT  (deep venous thrombosis) (HCC)    L leg in 2009  . Hypertension    enlarged heart  . Morbid obesity (HCC)   . OSA (obstructive sleep apnea)    on CPAP since 1995  . Pancreatitis    Past Surgical History:  Procedure Laterality Date  . BACK SURGERY  04/09/1999  . cervical laminectomy and fusion    . CHOLECYSTECTOMY N/A 01/25/2014   Procedure: LAPAROSCOPIC CHOLECYSTECTOMY WITH INTRAOPERATIVE CHOLANGIOGRAM;  Surgeon: Velora Heckler, MD;  Location: [facility];  Service: General;  Laterality: N/A;  . EUS N/A 01/24/2014   Procedure: UPPER ENDOSCOPIC ULTRASOUND (EUS) RADIAL;  Surgeon: Willis Modena, MD;  Location: [facility];  Service: Endoscopy;  Laterality: N/A;  . KNEE SURGERY  1998  . left inguinal hernia    . LUMBAR LAMINECTOMY    . TRIGGER FINGER RELEASE  08/04/2012   Procedure: MINOR RELEASE TRIGGER FINGER/A-1 PULLEY;  Surgeon: Nicki Reaper, MD;  Location: [facility];  Service: Orthopedics;  Laterality: Right;    Social History:   reports that he has never smoked. He has never used smokeless tobacco. He reports that he does not drink alcohol or use drugs.    Allergies  Allergen Reactions  . Penicillins Itching and Other (See Comments)    Has patient had a PCN reaction causing immediate rash, facial/tongue/throat swelling, SOB or lightheadedness with hypotension: No Has patient had a PCN reaction causing severe rash involving mucus membranes or skin necrosis: No Has patient had a PCN reaction that required hospitalization No Has patient had a PCN reaction occurring within the last 10 years: No If all of the above answers are "NO", then may proceed with Cephalosporin use.    Family History  Problem Relation Age of Onset  . Breast cancer Mother      Prior to Admission medications   Medication Sig Start Date End Date Taking? Authorizing Provider  aspirin EC 81 MG tablet Take 81 mg by mouth daily.   Yes Historical Provider, MD  cetirizine (ZYRTEC) 10 MG tablet Take 10  mg by mouth daily as needed for allergies.    Yes Historical Provider, MD  HYDROcodone-acetaminophen (NORCO/VICODIN) 5-325 MG per tablet Take 1-2 tablets by mouth every 4 (four) hours as needed for moderate pain. 01/27/14  Yes Christina P Rama, MD  ranitidine (ZANTAC) 150 MG tablet Take 150 mg by mouth at bedtime.   Yes Historical Provider, MD    Physical Exam: BP 142/94 (BP Location: Left Wrist)   Pulse 100   Temp 98.2 F (36.8 C)   Resp 13   Ht 5\' 11"  (1.803 m)   Wt (!) 201.9 kg (445 lb)   SpO2 94%   BMI 62.06 kg/m   GENERAL :   Alert and cooperative, and appears to be in no acute distress. HEAD:           normocephalic. EYES:            PERRL, EOMI.  EARS:           hearing grossly intact. NOSE:           No nasal discharge. THROAT:     Oral cavity and pharynx normal.   NECK:          supple, non-tender.  CARDIAC:    Normal S1 and S2. No gallop. No murmurs.  Vascular:     no peripheral edema.  LUNGS:       Clear to auscultation  ABDOMEN: Positive bowel sounds. Soft, nondistended, nontender. No guarding or rebound.      MSK:           No joint erythema or tenderness. Normal muscular development. EXT           : No significant deformity or joint abnormality. Neuro        : Alert, oriented to person, place, and time. SKIN:            No rash. No lesions. PSYCH:       No hallucination. Patient is not suicidal.          Labs on Admission:  Reviewed.   Radiological Exams on Admission: Dg Chest  2 View  Result Date: 02/20/2016 CLINICAL DATA:  Left-sided chest pain, shortness of breath for 4 days EXAM: CHEST  2 VIEW COMPARISON:  11/29/2014 FINDINGS: The heart size and mediastinal contours are within normal limits. Both lungs are clear. The visualized skeletal structures are unremarkable. IMPRESSION: No active cardiopulmonary disease. Electronically Signed   By: Elige Ko   On: 02/20/2016 18:05  Ct Angio Chest Pe W Or Wo Contrast  Result Date: 02/20/2016 CLINICAL DATA:   Left-sided chest pain and shortness of breath for several days, initial encounter EXAM: CT ANGIOGRAPHY CHEST WITH CONTRAST TECHNIQUE: Multidetector CT imaging of the chest was performed using the standard protocol during bolus administration of intravenous contrast. Multiplanar CT image reconstructions and MIPs were obtained to evaluate the vascular anatomy. CONTRAST:  100 mL Isovue 370. COMPARISON:  None. FINDINGS: Mediastinum/Lymph Nodes: No significant lymphadenopathy is identified. The thoracic inlet is within normal limits. Cardiovascular: Pulmonary artery demonstrates a normal branching pattern although large bilateral pulmonary emboli are identified. There are changes consistent with right heart strain with a RV/LV ratio of 1.1. The thoracic aorta show some mild calcifications without aneurysmal dilatation or dissection. Lungs/Pleura: No pulmonary mass, infiltrate, or effusion. Upper abdomen: Gallbladder is been surgically removed. No other focal abnormality is noted in the upper abdomen Musculoskeletal: No acute bony abnormality is noted. Review of the MIP images confirms the above findings. IMPRESSION: Positive for acute bilateral PE with CT evidence of right heart strain (RV/LV Ratio = 1.1) consistent with at least submassive (intermediate risk) PE. The presence of right heart strain has been associated with an increased risk of morbidity and mortality. Please activate Code PE by paging [phone]. Critical Value/emergent results were called by telephone at the time of interpretation on 02/20/2016 at 10:21 pm to Dr. Effie Shy, who verbally acknowledged these results. Electronically Signed   By: Alcide Clever M.D.   On: 02/20/2016 22:21   EKG:  Independently reviewed. Sinus rhythm with PACs  Assessment/Plan  Acute pulmonary embolism:  Hemodynamically stable but with right heart strain per CT.  Will check Echo, keep in step down unit Will check LE [place] for DVT Started on heparin ; pharmacy dosing.  Switching to oral medication on discharge.   OSA: continue CPAP.   Input & Output:  NA Lines & Tubes: PIV DVT prophylaxis:  On AC GI prophylaxis: NA Consultants: NA Code Status: Full Family Communication: at bedside   Disposition Plan: admit to step down     Eston Esters M.D [facility]

## 2016-02-21 ENCOUNTER — Inpatient Hospital Stay (HOSPITAL_COMMUNITY): Payer: Managed Care, Other (non HMO)

## 2016-02-21 DIAGNOSIS — I2609 Other pulmonary embolism with acute cor pulmonale: Secondary | ICD-10-CM

## 2016-02-21 DIAGNOSIS — K219 Gastro-esophageal reflux disease without esophagitis: Secondary | ICD-10-CM | POA: Diagnosis present

## 2016-02-21 DIAGNOSIS — I2699 Other pulmonary embolism without acute cor pulmonale: Secondary | ICD-10-CM

## 2016-02-21 LAB — HEPARIN LEVEL (UNFRACTIONATED)
HEPARIN UNFRACTIONATED: 0.4 [IU]/mL (ref 0.30–0.70)
Heparin Unfractionated: 0.78 IU/mL — ABNORMAL HIGH (ref 0.30–0.70)
Heparin Unfractionated: 0.8 IU/mL — ABNORMAL HIGH (ref 0.30–0.70)

## 2016-02-21 LAB — ECHOCARDIOGRAM COMPLETE
HEIGHTINCHES: 71 in
WEIGHTICAEL: 6723.15 [oz_av]

## 2016-02-21 LAB — MRSA PCR SCREENING: MRSA by PCR: NEGATIVE

## 2016-02-21 LAB — CBC
HEMATOCRIT: 43.9 % (ref 39.0–52.0)
Hemoglobin: 14.3 g/dL (ref 13.0–17.0)
MCH: 28.8 pg (ref 26.0–34.0)
MCHC: 32.6 g/dL (ref 30.0–36.0)
MCV: 88.5 fL (ref 78.0–100.0)
Platelets: 189 10*3/uL (ref 150–400)
RBC: 4.96 MIL/uL (ref 4.22–5.81)
RDW: 14 % (ref 11.5–15.5)
WBC: 8.5 10*3/uL (ref 4.0–10.5)

## 2016-02-21 MED ORDER — ONDANSETRON HCL 4 MG/2ML IJ SOLN
4.0000 mg | Freq: Four times a day (QID) | INTRAMUSCULAR | Status: DC | PRN
  Administered 2016-02-21: 4 mg via INTRAVENOUS
  Filled 2016-02-21: qty 2

## 2016-02-21 MED ORDER — HEPARIN (PORCINE) IN NACL 100-0.45 UNIT/ML-% IJ SOLN
1500.0000 [IU]/h | INTRAMUSCULAR | Status: AC
  Administered 2016-02-21: 1500 [IU]/h via INTRAVENOUS
  Filled 2016-02-21: qty 250

## 2016-02-21 MED ORDER — APIXABAN 5 MG PO TABS: 5.0000 mg | ORAL_TABLET | Freq: Two times a day (BID) | ORAL | Status: DC

## 2016-02-21 MED ORDER — PERFLUTREN LIPID MICROSPHERE
3.0000 mL | INTRAVENOUS | Status: AC | PRN
  Administered 2016-02-21: 3 mL via INTRAVENOUS
  Filled 2016-02-21: qty 10

## 2016-02-21 MED ORDER — PERFLUTREN LIPID MICROSPHERE
INTRAVENOUS | Status: AC
  Filled 2016-02-21: qty 10

## 2016-02-21 MED ORDER — APIXABAN 5 MG PO TABS
10.0000 mg | ORAL_TABLET | Freq: Two times a day (BID) | ORAL | Status: DC
  Administered 2016-02-22: 10 mg via ORAL
  Filled 2016-02-21: qty 2

## 2016-02-21 MED ORDER — HEPARIN (PORCINE) IN NACL 100-0.45 UNIT/ML-% IJ SOLN
1750.0000 [IU]/h | INTRAMUSCULAR | Status: DC
  Filled 2016-02-21 (×2): qty 250

## 2016-02-21 NOTE — Progress Notes (Signed)
ANTICOAGULATION CONSULT NOTE - Follow Up Consult  Pharmacy Consult for Heparin Indication: pulmonary embolus  Allergies  Allergen Reactions  . Penicillins Itching and Other (See Comments)    Has patient had a PCN reaction causing immediate rash, facial/tongue/throat swelling, SOB or lightheadedness with hypotension: No Has patient had a PCN reaction causing severe rash involving mucus membranes or skin necrosis: No Has patient had a PCN reaction that required hospitalization No Has patient had a PCN reaction occurring within the last 10 years: No If all of the above answers are "NO", then may proceed with Cephalosporin use.    Patient Measurements: Height: 5\' 11"  (180.3 cm) Weight: (!) 420 lb 3.2 oz (190.6 kg) IBW/kg (Calculated) : 75.3 Heparin Dosing Weight: 126 kg  Vital Signs: Temp: 98.1 F (36.7 C) (07/27 0400) Temp Source: Oral (07/27 0400) BP: 169/87 (07/27 0500) Pulse Rate: 82 (07/27 0500)  Labs:  Recent Labs  02/20/16 1732 02/20/16 2309 02/21/16 0513  HGB 15.1  --   --   HCT 44.6  --   --   PLT 188  --   --   APTT  --  32  --   LABPROT  --  14.3  --   INR  --  1.11  --   HEPARINUNFRC  --   --  0.78*  CREATININE 1.15  --   --     Estimated Creatinine Clearance: 104.1 mL/min (by C-G formula based on SCr of 1.15 mg/dL).   Medications:  Infusions:  . heparin 1,900 Units/hr (02/21/16 3790)    Assessment: 18 yoM presented to ED on 7/26 with c/o cough, dyspnea.  PMH includes DVT treated with warfarin in 2009, morbid obesity, OSA, and HTN.  CT angio shows acute bilateral PE with CT evidence of right heart strain.  Pharmacy has been consulted to dose Heparin.  Today, 02/21/2016: Heparin level 0.78, above therapeutic goal range. CBC:  Hgb and Plt remain stable and WNL. SCr 1.15 with CrCl > 100 ml/min.   Goal of Therapy:  Heparin level 0.3-0.7 units/ml Monitor platelets by anticoagulation protocol: Yes   Plan:   Decrease to heparin IV infusion at 1750  units/hr  Heparin level 6 hours after rate change  Daily heparin level and CBC  Follow up plans for PO anticoagulation.   Lynann Beaver PharmD, BCPS Pager [phone] 02/21/2016 7:03 AM

## 2016-02-21 NOTE — Progress Notes (Signed)
  Echocardiogram 2D Echocardiogram has been performed.  Arvil Chaco 02/21/2016, 9:45 AM

## 2016-02-21 NOTE — Procedures (Signed)
Placed patient on CPAP for the night.  Patient is tolerating well at this time. 

## 2016-02-21 NOTE — Progress Notes (Signed)
PROGRESS NOTE                                                                                                                                                                                                             Patient Demographics:    Richard Sandoval, is a 70 y.o. male, DOB - [birth year], ZOX:[id number]  Admit date - 02/20/2016   Admitting Physician Eston Esters, MD  Outpatient Primary MD for the patient is Cala Bradford, MD  LOS - 1  Chief Complaint  Patient presents with  . Shortness of Breath  . Chest Pain  . Cough       Brief Narrative   Patient is a 70 yo male with history of morbid obesity, DVT tx with coumadin in 2009, OSA on CPAP, who came with cc of cough, dyspnea on exertion for a week with new onset chest pain today mainly with cough and deep breathing. No increasing swelling in LEs or pain. No recent travel or surgery. He ambulates as tolerated but spend most of his time sedentary. He has hx of DVT that was treated for coumadin that was stopped in 2009. No history of GI bleed or bleeding disorder.    Subjective:    Richard Sandoval today has, No headache, No abdominal pain - No Nausea, No new weakness tingling or numbness, Minimal substernal chest pain with no shortness of breath at rest.   Assessment  & Plan :    1.Bilateral submassive PE on CT angiogram. Patient with history of DVT in 2009. Currently on heparin drip, will transition to oral liquids tomorrow, check echocardiogram and lower extremity venous duplex. He is now almost completely symptom-free will monitor for another 24 hours to discharge home. He likely will require lifelong anticoagulation. Case management consulted to provide one month Eliquis supply and information for future Eliquis refills through his PCP.  2. Morbid obesity with obstructive sleep apnea. CPAP at night, follow with PCP for weight loss.  3. GERD. On Pepcid.  4. Seasonal  allergies. Continue loratadine.    Family Communication  :  None present  Code Status :  Full  Diet : Healthy  Disposition Plan  :  Telemetry  Consults  :  None  Procedures  :    CT angiogram of the chest. submassive bilateral PE  Echocardiogram  DVT Prophylaxis  :  Heparin   Lab Results  Component Value Date   PLT 189 02/21/2016    Inpatient Medications  Scheduled Meds: . aspirin EC  81 mg Oral Daily  . famotidine  10 mg Oral Daily  . loratadine  10 mg Oral Daily   Continuous Infusions: . heparin 1,750 Units/hr (02/21/16 0739)   PRN Meds:.HYDROcodone-acetaminophen, perflutren lipid microspheres (DEFINITY) IV suspension  Antibiotics  :    Anti-infectives    None         Objective:   Vitals:   02/21/16 0300 02/21/16 0400 02/21/16 0500 02/21/16 0800  BP: (!) 141/73 (!) 146/105 (!) 169/87   Pulse: 65 71 82   Resp: 14 18 (!) 27   Temp:  98.1 F (36.7 C)  98.9 F (37.2 C)  TempSrc:  Oral  Oral  SpO2: 93% 96% 94%   Weight:      Height:        Wt Readings from Last 3 Encounters:  02/21/16 (!) 190.6 kg (420 lb 3.2 oz)  02/15/14 (!) 190.1 kg (419 lb)  01/27/14 (!) 192.5 kg (424 lb 6.4 oz)     Intake/Output Summary (Last 24 hours) at 02/21/16 0946 Last data filed at 02/21/16 0534  Gross per 24 hour  Intake            16.78 ml  Output              500 ml  Net          -483.22 ml     Physical Exam  Awake Alert, Oriented X 3, No new F.N deficits, Normal affect [place].AT,PERRAL Supple Neck,No JVD, No cervical lymphadenopathy appriciated.  Symmetrical Chest wall movement, Good air movement bilaterally, CTAB RRR,No Gallops,Rubs or new Murmurs, No Parasternal Heave +ve B.Sounds, Abd Soft, No tenderness, No organomegaly appriciated, No rebound - guarding or rigidity. No Cyanosis, Clubbing or edema, No new Rash or bruise      Data Review:    CBC  Recent Labs Lab 02/20/16 1732 02/21/16 0513  WBC 7.7 8.5  HGB 15.1 14.3  HCT 44.6 43.9  PLT  188 189  MCV 87.6 88.5  MCH 29.7 28.8  MCHC 33.9 32.6  RDW 13.7 14.0    Chemistries   Recent Labs Lab 02/20/16 1732  NA 139  K 3.7  CL 110  CO2 22  GLUCOSE 109*  BUN 14  CREATININE 1.15  CALCIUM 9.1   ------------------------------------------------------------------------------------------------------------------ No results for input(s): CHOL, HDL, LDLCALC, TRIG, CHOLHDL, LDLDIRECT in the last 72 hours.  No results found for: HGBA1C ------------------------------------------------------------------------------------------------------------------ No results for input(s): TSH, T4TOTAL, T3FREE, THYROIDAB in the last 72 hours.  Invalid input(s): FREET3 ------------------------------------------------------------------------------------------------------------------ No results for input(s): VITAMINB12, FOLATE, FERRITIN, TIBC, IRON, RETICCTPCT in the last 72 hours.  Coagulation profile  Recent Labs Lab 02/20/16 2309  INR 1.11    No results for input(s): DDIMER in the last 72 hours.  Cardiac Enzymes No results for input(s): CKMB, TROPONINI, MYOGLOBIN in the last 168 hours.  Invalid input(s): CK ------------------------------------------------------------------------------------------------------------------ No results found for: BNP  Micro Results Recent Results (from the past 240 hour(s))  MRSA PCR Screening     Status: None   Collection Time: 02/21/16  1:15 AM  Result Value Ref Range Status   MRSA by PCR NEGATIVE NEGATIVE Final    Comment:        The GeneXpert MRSA Assay (FDA approved for NASAL specimens only), is one component of a comprehensive MRSA colonization surveillance program. It is not  intended to diagnose MRSA infection nor to guide or monitor treatment for MRSA infections.     Radiology Reports Dg Chest 2 View  Result Date: 02/20/2016 CLINICAL DATA:  Left-sided chest pain, shortness of breath for 4 days EXAM: CHEST  2 VIEW COMPARISON:   11/29/2014 FINDINGS: The heart size and mediastinal contours are within normal limits. Both lungs are clear. The visualized skeletal structures are unremarkable. IMPRESSION: No active cardiopulmonary disease. Electronically Signed   By: Elige Ko   On: 02/20/2016 18:05  Ct Angio Chest Pe W Or Wo Contrast  Result Date: 02/20/2016 CLINICAL DATA:  Left-sided chest pain and shortness of breath for several days, initial encounter EXAM: CT ANGIOGRAPHY CHEST WITH CONTRAST TECHNIQUE: Multidetector CT imaging of the chest was performed using the standard protocol during bolus administration of intravenous contrast. Multiplanar CT image reconstructions and MIPs were obtained to evaluate the vascular anatomy. CONTRAST:  100 mL Isovue 370. COMPARISON:  None. FINDINGS: Mediastinum/Lymph Nodes: No significant lymphadenopathy is identified. The thoracic inlet is within normal limits. Cardiovascular: Pulmonary artery demonstrates a normal branching pattern although large bilateral pulmonary emboli are identified. There are changes consistent with right heart strain with a RV/LV ratio of 1.1. The thoracic aorta show some mild calcifications without aneurysmal dilatation or dissection. Lungs/Pleura: No pulmonary mass, infiltrate, or effusion. Upper abdomen: Gallbladder is been surgically removed. No other focal abnormality is noted in the upper abdomen Musculoskeletal: No acute bony abnormality is noted. Review of the MIP images confirms the above findings. IMPRESSION: Positive for acute bilateral PE with CT evidence of right heart strain (RV/LV Ratio = 1.1) consistent with at least submassive (intermediate risk) PE. The presence of right heart strain has been associated with an increased risk of morbidity and mortality. Please activate Code PE by paging [phone]. Critical Value/emergent results were called by telephone at the time of interpretation on 02/20/2016 at 10:21 pm to Dr. Effie Shy, who verbally acknowledged these  results. Electronically Signed   By: Alcide Clever M.D.   On: 02/20/2016 22:21   Time Spent in minutes  30   Sanyiah Kanzler K M.D on 02/21/2016 at 9:46 AM  Between 7am to 7pm - Pager - [phone]  After 7pm go to [email or web address] - password [facility]  [facility] -  Office  [phone]

## 2016-02-21 NOTE — Progress Notes (Addendum)
[id number] Felesha Moncrieffe,BSN,RN3,CCM:Patient has private insurance is not eligible for medication assistance./1121/cards for Eliquis 30 day free supply and 10 dollar co-pay given and explained to patient.

## 2016-02-21 NOTE — Progress Notes (Signed)
VASCULAR LAB PRELIMINARY  PRELIMINARY  PRELIMINARY  PRELIMINARY  Bilateral lower extremity venous duplex has been completed.    Left leg: DVT noted at level of mid femoral vein, popliteal vein and posterior tibial veins. Left peroneal veins were non visualize due to the depth of vessels and edema.  Right leg no evidence of DVT.  Bilateral- no evidence of superificial thrombosis or Baker's cyst.  Gave patient's nurse the results.  Cortlynn Hollinsworth, RVT, RDMS 02/21/2016, 10:45 AM

## 2016-02-21 NOTE — Progress Notes (Signed)
ANTICOAGULATION CONSULT NOTE - Follow Up Consult  Pharmacy Consult for Heparin, Apixaban Indication: pulmonary embolus  Allergies  Allergen Reactions  . Penicillins Itching and Other (See Comments)    Has patient had a PCN reaction causing immediate rash, facial/tongue/throat swelling, SOB or lightheadedness with hypotension: No Has patient had a PCN reaction causing severe rash involving mucus membranes or skin necrosis: No Has patient had a PCN reaction that required hospitalization No Has patient had a PCN reaction occurring within the last 10 years: No If all of the above answers are "NO", then may proceed with Cephalosporin use.    Patient Measurements: Height: 5\' 11"  (180.3 cm) Weight: (!) 420 lb (190.5 kg) IBW/kg (Calculated) : 75.3 Heparin Dosing Weight: 126 kg  Vital Signs: Temp: 97.6 F (36.4 C) (07/27 2114) Temp Source: Oral (07/27 2114) BP: 145/90 (07/27 2114) Pulse Rate: 93 (07/27 2114)  Labs:  Recent Labs  02/20/16 1732 02/20/16 2309 02/21/16 0513 02/21/16 1356 02/21/16 2218  HGB 15.1  --  14.3  --   --   HCT 44.6  --  43.9  --   --   PLT 188  --  189  --   --   APTT  --  32  --   --   --   LABPROT  --  14.3  --   --   --   INR  --  1.11  --   --   --   HEPARINUNFRC  --   --  0.78* 0.80* 0.40  CREATININE 1.15  --   --   --   --     Estimated Creatinine Clearance: 104.1 mL/min (by C-G formula based on SCr of 1.15 mg/dL).   Medications:  Infusions:  . heparin 1,500 Units/hr (02/21/16 2300)    Assessment: 7 yoM presented to ED on 7/26 with c/o cough, dyspnea.  PMH includes DVT treated with warfarin in 2009, morbid obesity, OSA, and HTN.  CT angio shows acute bilateral PE with CT evidence of right heart strain.  Pharmacy has been consulted to dose Heparin.  Today, 02/21/2016: Heparin level 0.8, remains therapeutic goal range despite previous rate decreases. CBC:  Hgb and Plt remain stable and WNL. RN reports no bleeding or complications known at  this time. SCr 1.15 with CrCl > 100 ml/min. 2200 HL=0.40    Goal of Therapy:  Heparin level 0.3-0.7 units/ml Monitor platelets by anticoagulation protocol: Yes   Plan:  Tonight:  Continue  heparin IV infusion at 1500 units/hr  Will not recheck am HL, since eliquis to start  Daily CBC  On 7/28 at 0800, change from Heparin IV to PO Eliquis  At 0800, d/c Heparin IV  At 0800 start Eliquis 10 mg PO BID x7 days, then 5 mg PO BID.  Pharmacy to provide education prior to discharge.    Lorenza Evangelist 02/21/2016 11:45 PM

## 2016-02-21 NOTE — Progress Notes (Signed)
ANTICOAGULATION CONSULT NOTE - Follow Up Consult  Pharmacy Consult for Heparin, Apixaban Indication: pulmonary embolus  Allergies  Allergen Reactions  . Penicillins Itching and Other (See Comments)    Has patient had a PCN reaction causing immediate rash, facial/tongue/throat swelling, SOB or lightheadedness with hypotension: No Has patient had a PCN reaction causing severe rash involving mucus membranes or skin necrosis: No Has patient had a PCN reaction that required hospitalization No Has patient had a PCN reaction occurring within the last 10 years: No If all of the above answers are "NO", then may proceed with Cephalosporin use.    Patient Measurements: Height: 5\' 11"  (180.3 cm) Weight: (!) 420 lb (190.5 kg) IBW/kg (Calculated) : 75.3 Heparin Dosing Weight: 126 kg  Vital Signs: Temp: 97.6 F (36.4 C) (07/27 1211) Temp Source: Oral (07/27 1211) BP: 164/111 (07/27 1211) Pulse Rate: 90 (07/27 1211)  Labs:  Recent Labs  02/20/16 1732 02/20/16 2309 02/21/16 0513  HGB 15.1  --  14.3  HCT 44.6  --  43.9  PLT 188  --  189  APTT  --  32  --   LABPROT  --  14.3  --   INR  --  1.11  --   HEPARINUNFRC  --   --  0.78*  CREATININE 1.15  --   --     Estimated Creatinine Clearance: 104.1 mL/min (by C-G formula based on SCr of 1.15 mg/dL).   Medications:  Infusions:  . heparin 1,750 Units/hr (02/21/16 0739)    Assessment: 35 yoM presented to ED on 7/26 with c/o cough, dyspnea.  PMH includes DVT treated with warfarin in 2009, morbid obesity, OSA, and HTN.  CT angio shows acute bilateral PE with CT evidence of right heart strain.  Pharmacy has been consulted to dose Heparin.  Today, 02/21/2016: Heparin level 0.8, remains therapeutic goal range despite previous rate decreases. CBC:  Hgb and Plt remain stable and WNL. RN reports no bleeding or complications known at this time. SCr 1.15 with CrCl > 100 ml/min.   Goal of Therapy:  Heparin level 0.3-0.7 units/ml Monitor  platelets by anticoagulation protocol: Yes   Plan:  Tonight:  Hold heparin x1 hour  Decrease to heparin IV infusion at 1500 units/hr  Heparin level 6 hours after rate change  Daily CBC  On 7/28 at 0800, change from Heparin IV to PO Eliquis  At 0800, d/c Heparin IV  At 0800 start Eliquis 10 mg PO BID x7 days, then 5 mg PO BID.  Pharmacy to provide education prior to discharge.   Lynann Beaver PharmD, BCPS Pager [phone] 02/21/2016 12:58 PM

## 2016-02-21 NOTE — Progress Notes (Signed)
ANTICOAGULATION CONSULT NOTE - Initial Consult  Pharmacy Consult for IV heparin Indication: pulmonary embolus  Allergies  Allergen Reactions  . Penicillins Itching and Other (See Comments)    Has patient had a PCN reaction causing immediate rash, facial/tongue/throat swelling, SOB or lightheadedness with hypotension: No Has patient had a PCN reaction causing severe rash involving mucus membranes or skin necrosis: No Has patient had a PCN reaction that required hospitalization No Has patient had a PCN reaction occurring within the last 10 years: No If all of the above answers are "NO", then may proceed with Cephalosporin use.    Patient Measurements: Height: 5\' 11"  (180.3 cm) Weight: (!) 420 lb 3.2 oz (190.6 kg) IBW/kg (Calculated) : 75.3 Heparin Dosing Weight: 113 kg  Vital Signs: Temp: 98.1 F (36.7 C) (07/27 0400) Temp Source: Oral (07/27 0400) BP: 169/87 (07/27 0500) Pulse Rate: 82 (07/27 0500)  Labs:  Recent Labs  02/20/16 1732 02/20/16 2309 02/21/16 0513  HGB 15.1  --   --   HCT 44.6  --   --   PLT 188  --   --   APTT  --  32  --   LABPROT  --  14.3  --   INR  --  1.11  --   HEPARINUNFRC  --   --  0.78*  CREATININE 1.15  --   --     Estimated Creatinine Clearance: 104.1 mL/min (by C-G formula based on SCr of 1.15 mg/dL).   Medical History: Past Medical History:  Diagnosis Date  . DVT (deep venous thrombosis) (HCC)    L leg in 2009  . Hypertension    enlarged heart  . Morbid obesity (HCC)   . OSA (obstructive sleep apnea)    on CPAP since 1995  . Pancreatitis        Assessment: 53  yoM c/o left sided CP and SOB.  IV heparin per Rx for PE. Goal of Therapy:  Heparin level 0.3-0.7 units/ml Monitor platelets by anticoagulation protocol: Yes   Plan:  Baseline coags stat Heparin 6000 unit bolus x1 Start drip @ 1900 units/hr Daily CBC/HL Check 1st HL in 6 hours   Lorenza Evangelist 7/26 2300

## 2016-02-22 DIAGNOSIS — I2699 Other pulmonary embolism without acute cor pulmonale: Principal | ICD-10-CM

## 2016-02-22 LAB — CBC
HCT: 43.2 % (ref 39.0–52.0)
Hemoglobin: 13.9 g/dL (ref 13.0–17.0)
MCH: 28.7 pg (ref 26.0–34.0)
MCHC: 32.2 g/dL (ref 30.0–36.0)
MCV: 89.3 fL (ref 78.0–100.0)
PLATELETS: 183 10*3/uL (ref 150–400)
RBC: 4.84 MIL/uL (ref 4.22–5.81)
RDW: 14.2 % (ref 11.5–15.5)
WBC: 7.8 10*3/uL (ref 4.0–10.5)

## 2016-02-22 MED ORDER — HEPARIN (PORCINE) IN NACL 100-0.45 UNIT/ML-% IJ SOLN
1500.0000 [IU]/h | INTRAMUSCULAR | Status: DC
  Administered 2016-02-22 – 2016-02-23 (×2): 1500 [IU]/h via INTRAVENOUS
  Filled 2016-02-22 (×2): qty 250

## 2016-02-22 MED ORDER — HEPARIN (PORCINE) IN NACL 100-0.45 UNIT/ML-% IJ SOLN: 1500.0000 [IU]/h | INTRAMUSCULAR | Status: DC

## 2016-02-22 MED ORDER — FUROSEMIDE 10 MG/ML IJ SOLN
40.0000 mg | Freq: Once | INTRAMUSCULAR | Status: DC
  Filled 2016-02-22: qty 4

## 2016-02-22 NOTE — Progress Notes (Signed)
PROGRESS NOTE                                                                                                                                                                                                             Patient Demographics:    Richard Sandoval, is a 70 y.o. male, DOB - 1945-08-19, XBM:[id number]  Admit date - 02/20/2016   Admitting Physician Eston Esters, MD  Outpatient Primary MD for the patient is Cala Bradford, MD  LOS - 2  Chief Complaint  Patient presents with  . Shortness of Breath  . Chest Pain  . Cough       Brief Narrative   Patient is a 70 yo male with history of morbid obesity, DVT tx with coumadin in 2009, OSA on CPAP, who came with cc of cough, dyspnea on exertion for a week with new onset chest pain today mainly with cough and deep breathing. No increasing swelling in LEs or pain. No recent travel or surgery. He ambulates as tolerated but spend most of his time sedentary. He has hx of DVT that was treated for coumadin that was stopped in 2009. No history of GI bleed or bleeding disorder.    Subjective:    Richard Sandoval today has, No headache, No abdominal pain - No Nausea, No new weakness tingling or numbness, Minimal substernal chest pain with no shortness of breath at rest.   Assessment  & Plan :    1.Bilateral submassive PE on CT angiogram with acute hypoxic respiratory failure. Patient with history of DVT in 2009. Currently on heparin drip Which I will continue for another 24 hours as he still short of breath, lower extremity venous duplex confirms left leg extensive DVT, echocardiogram stable except for moderate pulmonary hypertension which could be due to underlying morbid obesity and obstructive sleep apnea as well, no previous echocardiograms in the chart, case management already consulted to arrange for home Eliquis. Continue oxygen supplementation.  2. Morbid obesity with obstructive  sleep apnea. CPAP at night, follow with PCP for weight loss.  3. GERD. On Pepcid.  4. Seasonal allergies. Continue loratadine.  5. Chronic diastolic CHF with EF 55%. Compensated.  6. Moderate pulmonary hypertension. Likely due to morbid obesity with obstructive sleep apnea. Some increase in pressures could be due to his acute PE as well. Continue treatment as  above.  Family Communication  :  None present  Code Status :  Full  Diet : Healthy  Disposition Plan  :  Telemetry  Consults  :  None  Procedures  :    CT angiogram of the chest. submassive bilateral PE  Echocardiogram - Left ventricle: The cavity size was normal. There was moderate concentric hypertrophy. Systolic function was normal. The estimated ejection fraction was in the range of 50% to 55%. Challenging to estimate, even with Definity. Wall motion was normal; there were no regional wall motion abnormalities. Doppler parameters are consistent with abnormal left ventricular relaxation (grade 1 diastolic dysfunction). - Ventricular septum: The contour showed diastolic flattening. - Left atrium: The atrium was moderately dilated. - Right ventricle: The cavity size was moderately dilated. Wall thickness was normal. Systolic function was moderately reduced. - Right atrium: The atrium was severely dilated. - Pulmonary arteries: PA peak pressure: 52 mm Hg (S).  Lower extremity venous duplex. Extensive left leg DVT.  DVT Prophylaxis  :   Heparin   Lab Results  Component Value Date   PLT 183 02/22/2016    Inpatient Medications  Scheduled Meds: . aspirin EC  81 mg Oral Daily  . famotidine  10 mg Oral Daily  . furosemide  40 mg Intravenous Once  . loratadine  10 mg Oral Daily   Continuous Infusions:   PRN Meds:.HYDROcodone-acetaminophen, ondansetron (ZOFRAN) IV  Antibiotics  :    Anti-infectives    None         Objective:   Vitals:   02/21/16 1958 02/21/16 2000 02/21/16 2114 02/22/16 0517  BP:   (!)  145/90 (!) 140/95  Pulse:  97 93 79  Resp:  20 20 20   Temp:   97.6 F (36.4 C) 98.5 F (36.9 C)  TempSrc:   Oral Oral  SpO2: (!) 86% 94% 93% 100%  Weight:      Height:        Wt Readings from Last 3 Encounters:  02/21/16 (!) 190.5 kg (420 lb)  02/15/14 (!) 190.1 kg (419 lb)  01/27/14 (!) 192.5 kg (424 lb 6.4 oz)     Intake/Output Summary (Last 24 hours) at 02/22/16 1233 Last data filed at 02/22/16 3817  Gross per 24 hour  Intake           530.25 ml  Output              650 ml  Net          -119.75 ml     Physical Exam  Awake Alert, Oriented X 3, No new F.N deficits, Normal affect Badger.AT,PERRAL Supple Neck,No JVD, No cervical lymphadenopathy appriciated.  Symmetrical Chest wall movement, Good air movement bilaterally, CTAB RRR,No Gallops,Rubs or new Murmurs, No Parasternal Heave +ve B.Sounds, Abd Soft, No tenderness, No organomegaly appriciated, No rebound - guarding or rigidity. No Cyanosis, Clubbing or edema, No new Rash or bruise      Data Review:    CBC  Recent Labs Lab 02/20/16 1732 02/21/16 0513 02/22/16 0521  WBC 7.7 8.5 7.8  HGB 15.1 14.3 13.9  HCT 44.6 43.9 43.2  PLT 188 189 183  MCV 87.6 88.5 89.3  MCH 29.7 28.8 28.7  MCHC 33.9 32.6 32.2  RDW 13.7 14.0 14.2    Chemistries   Recent Labs Lab 02/20/16 1732  NA 139  K 3.7  CL 110  CO2 22  GLUCOSE 109*  BUN 14  CREATININE 1.15  CALCIUM 9.1   ------------------------------------------------------------------------------------------------------------------  No results for input(s): CHOL, HDL, LDLCALC, TRIG, CHOLHDL, LDLDIRECT in the last 72 hours.  No results found for: HGBA1C ------------------------------------------------------------------------------------------------------------------ No results for input(s): TSH, T4TOTAL, T3FREE, THYROIDAB in the last 72 hours.  Invalid input(s):  FREET3 ------------------------------------------------------------------------------------------------------------------ No results for input(s): VITAMINB12, FOLATE, FERRITIN, TIBC, IRON, RETICCTPCT in the last 72 hours.  Coagulation profile  Recent Labs Lab 02/20/16 2309  INR 1.11    No results for input(s): DDIMER in the last 72 hours.  Cardiac Enzymes No results for input(s): CKMB, TROPONINI, MYOGLOBIN in the last 168 hours.  Invalid input(s): CK ------------------------------------------------------------------------------------------------------------------ No results found for: BNP  Micro Results Recent Results (from the past 240 hour(s))  MRSA PCR Screening     Status: None   Collection Time: 02/21/16  1:15 AM  Result Value Ref Range Status   MRSA by PCR NEGATIVE NEGATIVE Final    Comment:        The GeneXpert MRSA Assay (FDA approved for NASAL specimens only), is one component of a comprehensive MRSA colonization surveillance program. It is not intended to diagnose MRSA infection nor to guide or monitor treatment for MRSA infections.     Radiology Reports Dg Chest 2 View  Result Date: 02/20/2016 CLINICAL DATA:  Left-sided chest pain, shortness of breath for 4 days EXAM: CHEST  2 VIEW COMPARISON:  11/29/2014 FINDINGS: The heart size and mediastinal contours are within normal limits. Both lungs are clear. The visualized skeletal structures are unremarkable. IMPRESSION: No active cardiopulmonary disease. Electronically Signed   By: Elige Ko   On: 02/20/2016 18:05  Ct Angio Chest Pe W Or Wo Contrast  Result Date: 02/20/2016 CLINICAL DATA:  Left-sided chest pain and shortness of breath for several days, initial encounter EXAM: CT ANGIOGRAPHY CHEST WITH CONTRAST TECHNIQUE: Multidetector CT imaging of the chest was performed using the standard protocol during bolus administration of intravenous contrast. Multiplanar CT image reconstructions and MIPs were obtained  to evaluate the vascular anatomy. CONTRAST:  100 mL Isovue 370. COMPARISON:  None. FINDINGS: Mediastinum/Lymph Nodes: No significant lymphadenopathy is identified. The thoracic inlet is within normal limits. Cardiovascular: Pulmonary artery demonstrates a normal branching pattern although large bilateral pulmonary emboli are identified. There are changes consistent with right heart strain with a RV/LV ratio of 1.1. The thoracic aorta show some mild calcifications without aneurysmal dilatation or dissection. Lungs/Pleura: No pulmonary mass, infiltrate, or effusion. Upper abdomen: Gallbladder is been surgically removed. No other focal abnormality is noted in the upper abdomen Musculoskeletal: No acute bony abnormality is noted. Review of the MIP images confirms the above findings. IMPRESSION: Positive for acute bilateral PE with CT evidence of right heart strain (RV/LV Ratio = 1.1) consistent with at least submassive (intermediate risk) PE. The presence of right heart strain has been associated with an increased risk of morbidity and mortality. Please activate Code PE by paging [phone]. Critical Value/emergent results were called by telephone at the time of interpretation on 02/20/2016 at 10:21 pm to Dr. Effie Shy, who verbally acknowledged these results. Electronically Signed   By: Alcide Clever M.D.   On: 02/20/2016 22:21   Time Spent in minutes  30   Gilberte Gorley K M.D on 02/22/2016 at 12:33 PM  Between 7am to 7pm - Pager - [phone]  After 7pm go to [email or web address] - password [facility]  [facility] -  Office  [phone]

## 2016-02-22 NOTE — Care Management Note (Signed)
Case Management Note  Patient Details  Name: Richard Sandoval MRN: [id number] Date of Birth: 24-Jul-1946  Subjective/Objective:  Patient already has Eliquis 30day free trial offer discount coupon.  Will monitor for home 02. Await qualifying 02 sats, & home 02 order.                  Action/Plan:d/c plan home.   Expected Discharge Date:                  Expected Discharge Plan:  Home/Self Care  In-House Referral:     Discharge planning Services  CM Consult  Post Acute Care Choice:    Choice offered to:     DME Arranged:    DME Agency:     HH Arranged:    HH Agency:     Status of Service:  In process, will continue to follow  If discussed at Long Length of Stay Meetings, dates discussed:    Additional Comments:  Lanier Clam, RN 02/22/2016, 4:22 PM

## 2016-02-22 NOTE — Progress Notes (Signed)
SATURATION QUALIFICATIONS: (This note is used to comply with regulatory documentation for home oxygen)  Patient Saturations on Room Air at Rest =86  Patient Saturations on Room Air while Ambulating = 84  Patient Saturations on2 Liters of oxygen while Ambulating = 93%  Please briefly explain why patient needs home oxygen:

## 2016-02-22 NOTE — Progress Notes (Signed)
ANTICOAGULATION CONSULT NOTE - Follow Up Consult  Pharmacy Consult for Heparin Indication: pulmonary embolus, deep vein thombosis  Allergies  Allergen Reactions  . Penicillins Itching and Other (See Comments)    Has patient had a PCN reaction causing immediate rash, facial/tongue/throat swelling, SOB or lightheadedness with hypotension: No Has patient had a PCN reaction causing severe rash involving mucus membranes or skin necrosis: No Has patient had a PCN reaction that required hospitalization No Has patient had a PCN reaction occurring within the last 10 years: No If all of the above answers are "NO", then may proceed with Cephalosporin use.    Patient Measurements: Height: 5\' 11"  (180.3 cm) Weight: (!) 420 lb (190.5 kg) IBW/kg (Calculated) : 75.3 Heparin Dosing Weight: 126 kg  Vital Signs: Temp: 98.5 F (36.9 C) (07/28 0517) Temp Source: Oral (07/28 0517) BP: 140/95 (07/28 0517) Pulse Rate: 79 (07/28 0517)  Labs:  Recent Labs  02/20/16 1732 02/20/16 2309 02/21/16 0513 02/21/16 1356 02/21/16 2218 02/22/16 0521  HGB 15.1  --  14.3  --   --  13.9  HCT 44.6  --  43.9  --   --  43.2  PLT 188  --  189  --   --  183  APTT  --  32  --   --   --   --   LABPROT  --  14.3  --   --   --   --   INR  --  1.11  --   --   --   --   HEPARINUNFRC  --   --  0.78* 0.80* 0.40  --   CREATININE 1.15  --   --   --   --   --     Estimated Creatinine Clearance: 104.1 mL/min (by C-G formula based on SCr of 1.15 mg/dL).   Assessment: 88 yoM presented to ED on 7/26 with c/o cough, dyspnea.  PMH includes DVT treated with warfarin in 2009, morbid obesity, OSA, and HTN.  CT angio shows acute bilateral PE with CT evidence of right heart strain. Lower extremity venous duplex confirms left leg extensive DVT. Pharmacy has been consulted to dose Heparin.  Today, 02/22/2016:  CBC:  Hgb and Pltc remain stable and WNL  No bleeding issues reported per nursing  SCr 1.15 (7/26) with CrCl > 100  ml/min  Planned transition to Apixaban today and patient received dose of 10mg  PO x 1 this AM at 0732. Patient, however, still short of breath today and MD would like to resume heparin for the next 24 hours.   Goal of Therapy:  Heparin level 0.3-0.7 units/ml  APTT 66-102 seconds Monitor platelets by anticoagulation protocol: Yes   Plan:   Will resume heparin infusion at 1930 today (~ 12 hours after last Apixaban dose)-MD is aware.   Resume heparin infusion at previous rate of 1500 units/hr, as last heparin level (7/27 PM) was therapeutic at this rate.  Check heparin level, aPTT 6 hours after heparin infusion resumed. If aPTT and heparin level correlate, can go back to using heparin levels to adjust heparin infusion, otherwise will need to use aPTT until they correlate.  Daily CBC, HL.  Monitor closely for s/s of bleeding.  F/u plans to transition to oral anticoagulation.    Greer Pickerel, PharmD, BCPS Pager: [phone] 02/22/2016 12:49 PM

## 2016-02-23 LAB — APTT
APTT: 90 s — AB (ref 24–36)
APTT: 118 s — AB (ref 24–36)
APTT: 88 s — AB (ref 24–36)

## 2016-02-23 LAB — CBC
HCT: 42.3 % (ref 39.0–52.0)
Hemoglobin: 13.6 g/dL (ref 13.0–17.0)
MCH: 28.6 pg (ref 26.0–34.0)
MCHC: 32.2 g/dL (ref 30.0–36.0)
MCV: 89.1 fL (ref 78.0–100.0)
PLATELETS: 176 10*3/uL (ref 150–400)
RBC: 4.75 MIL/uL (ref 4.22–5.81)
RDW: 13.9 % (ref 11.5–15.5)
WBC: 7.5 10*3/uL (ref 4.0–10.5)

## 2016-02-23 LAB — HEPARIN LEVEL (UNFRACTIONATED)
HEPARIN UNFRACTIONATED: 0.93 [IU]/mL — AB (ref 0.30–0.70)
Heparin Unfractionated: 0.82 IU/mL — ABNORMAL HIGH (ref 0.30–0.70)
Heparin Unfractionated: 0.57 IU/mL (ref 0.30–0.70)

## 2016-02-23 MED ORDER — HEPARIN (PORCINE) IN NACL 100-0.45 UNIT/ML-% IJ SOLN
1250.0000 [IU]/h | INTRAMUSCULAR | Status: DC
Start: 2016-02-23 — End: 2016-02-24
  Administered 2016-02-24: 1250 [IU]/h via INTRAVENOUS
  Filled 2016-02-23 (×2): qty 250

## 2016-02-23 NOTE — Progress Notes (Signed)
ANTICOAGULATION CONSULT NOTE - Follow Up Consult  Pharmacy Consult for Heparin Indication: pulmonary embolus, deep vein thombosis  Allergies  Allergen Reactions  . Penicillins Itching and Other (See Comments)    Has patient had a PCN reaction causing immediate rash, facial/tongue/throat swelling, SOB or lightheadedness with hypotension: No Has patient had a PCN reaction causing severe rash involving mucus membranes or skin necrosis: No Has patient had a PCN reaction that required hospitalization No Has patient had a PCN reaction occurring within the last 10 years: No If all of the above answers are "NO", then may proceed with Cephalosporin use.    Patient Measurements: Height: 5\' 11"  (180.3 cm) Weight: (!) 420 lb (190.5 kg) IBW/kg (Calculated) : 75.3 Heparin Dosing Weight: 126 kg  Vital Signs: Temp: 98.2 F (36.8 C) (07/28 2149) Temp Source: Oral (07/28 2149) BP: 121/80 (07/28 2149) Pulse Rate: 78 (07/28 2353)  Labs:  Recent Labs  02/20/16 1732 02/20/16 2309  02/21/16 0513 02/21/16 1356 02/21/16 2218 02/22/16 0521 02/23/16 0159  HGB 15.1  --   --  14.3  --   --  13.9 13.6  HCT 44.6  --   --  43.9  --   --  43.2 42.3  PLT 188  --   --  189  --   --  183 176  APTT  --  32  --   --   --   --   --  90*  LABPROT  --  14.3  --   --   --   --   --   --   INR  --  1.11  --   --   --   --   --   --   HEPARINUNFRC  --   --   < > 0.78* 0.80* 0.40  --  0.93*  CREATININE 1.15  --   --   --   --   --   --   --   < > = values in this interval not displayed.  Estimated Creatinine Clearance: 104.1 mL/min (by C-G formula based on SCr of 1.15 mg/dL).   Assessment: 60 yoM presented to ED on 7/26 with c/o cough, dyspnea.  PMH includes DVT treated with warfarin in 2009, morbid obesity, OSA, and HTN.  CT angio shows acute bilateral PE with CT evidence of right heart strain. Lower extremity venous duplex confirms left leg extensive DVT. Pharmacy has been consulted to dose  Heparin.  02/22/16:  Planned transition to Apixaban today and patient received dose of 10mg  PO x 1 this AM at 0732. Patient, however, still short of breath today and MD would like to resume heparin for the next 24 hours.   02/23/16:  CBC:  Hgb and Pltc remain stable and WNL  No complications of therapy noted  Heparin currently infusing @ 1500 units/hr  Heparin Level = 0.93 and aPTT = 90 sec  Levels do not correlate, as heparin level is falsely elevated due to Eliquis dose received on 7/28  APTT therapeutic  Goal of Therapy:  Heparin level 0.3-0.7 units/ml  APTT 66-102 seconds Monitor platelets by anticoagulation protocol: Yes   Plan:   Continue heparin infusion at 1500 units/hr  Recheck heparin level, aPTT 6 hours to after heparin infusion resumed. If aPTT and heparin level correlate, can go back to using heparin levels to adjust heparin infusion, otherwise will need to use aPTT until they correlate.  Daily CBC, HL.  Monitor closely for s/s of bleeding.  F/u plans to transition to oral anticoagulation.    Terrilee Files, PharmD 02/23/16 @ 03:21

## 2016-02-23 NOTE — Progress Notes (Signed)
PROGRESS NOTE                                                                                                                                                                                                             Patient Demographics:    Richard Sandoval, is a 70 y.o. male, DOB - 01/30/1946, IRS:[id number]  Admit date - 02/20/2016   Admitting Physician Eston Esters, MD  Outpatient Primary MD for the patient is Cala Bradford, MD  LOS - 3  Chief Complaint  Patient presents with  . Shortness of Breath  . Chest Pain  . Cough       Brief Narrative   Patient is a 70 yo male with history of morbid obesity, DVT tx with coumadin in 2009, OSA on CPAP, who came with cc of cough, dyspnea on exertion for a week with new onset chest pain today mainly with cough and deep breathing. No increasing swelling in LEs or pain. No recent travel or surgery. He ambulates as tolerated but spend most of his time sedentary. He has hx of DVT that was treated for coumadin that was stopped in 2009. No history of GI bleed or bleeding disorder.    Subjective:    Richard Sandoval today has, No headache, No abdominal pain - No Nausea, No new weakness tingling or numbness, Minimal substernal chest pain with no shortness of breath at rest.However does get shortness of breath upon minimal exertion.   Assessment  & Plan :    1.Bilateral submassive PE on CT angiogram with acute hypoxic respiratory failure. Patient with history of DVT in 2009. Currently on heparin drip Which I will continue for another 24 hours as he still Extremely hypoxic on exertion, lower extremity venous duplex confirms left leg extensive DVT, echocardiogram stable except for moderate pulmonary hypertension which could be due to underlying morbid obesity and obstructive sleep apnea as well, no previous echocardiograms in the chart, case management already consulted to arrange for home Eliquis.  Continue oxygen supplementation here, also ordered home oxygen. He is hemodynamically stable.  2. Morbid obesity with obstructive sleep apnea. CPAP at night, follow with PCP for weight loss.  3. GERD. On Pepcid.  4. Seasonal allergies. Continue loratadine.  5. Chronic diastolic CHF with EF 55%. Compensated.  6. Moderate pulmonary hypertension. Likely due to morbid obesity with obstructive  sleep apnea. Some increase in pressures could be due to his acute PE as well. Continue treatment as above.  Family Communication  :  None present  Code Status :  Full  Diet : Healthy  Disposition Plan  :  Telemetry  Consults  :  None  Procedures  :    CT angiogram of the chest. submassive bilateral PE  Echocardiogram - Left ventricle: The cavity size was normal. There was moderate concentric hypertrophy. Systolic function was normal. The estimated ejection fraction was in the range of 50% to 55%. Challenging to estimate, even with Definity. Wall motion was normal; there were no regional wall motion abnormalities. Doppler parameters are consistent with abnormal left ventricular relaxation (grade 1 diastolic dysfunction). - Ventricular septum: The contour showed diastolic flattening. - Left atrium: The atrium was moderately dilated. - Right ventricle: The cavity size was moderately dilated. Wall thickness was normal. Systolic function was moderately reduced. - Right atrium: The atrium was severely dilated. - Pulmonary arteries: PA peak pressure: 52 mm Hg (S).  Lower extremity venous duplex. Extensive left leg DVT.  DVT Prophylaxis  :   Heparin   Lab Results  Component Value Date   PLT 176 02/23/2016    Inpatient Medications  Scheduled Meds: . aspirin EC  81 mg Oral Daily  . famotidine  10 mg Oral Daily  . furosemide  40 mg Intravenous Once  . loratadine  10 mg Oral Daily   Continuous Infusions: . heparin     PRN Meds:.HYDROcodone-acetaminophen, ondansetron (ZOFRAN)  IV  Antibiotics  :    Anti-infectives    None         Objective:   Vitals:   02/23/16 0451 02/23/16 0932 02/23/16 0935 02/23/16 0937  BP: 136/79     Pulse: 64     Resp: 18     Temp: 97.3 F (36.3 C)     TempSrc: Oral     SpO2: 93% 95% (!) 55% 95%  Weight:      Height:        Wt Readings from Last 3 Encounters:  02/21/16 (!) 190.5 kg (420 lb)  02/15/14 (!) 190.1 kg (419 lb)  01/27/14 (!) 192.5 kg (424 lb 6.4 oz)     Intake/Output Summary (Last 24 hours) at 02/23/16 1046 Last data filed at 02/23/16 0600  Gross per 24 hour  Intake           396.75 ml  Output              550 ml  Net          -153.25 ml     Physical Exam  Awake Alert, Oriented X 3, No new F.N deficits, Normal affect [place].AT,PERRAL Supple Neck,No JVD, No cervical lymphadenopathy appriciated.  Symmetrical Chest wall movement, Good air movement bilaterally, CTAB RRR,No Gallops,Rubs or new Murmurs, No Parasternal Heave +ve B.Sounds, Abd Soft, No tenderness, No organomegaly appriciated, No rebound - guarding or rigidity. No Cyanosis, Clubbing or edema, No new Rash or bruise      Data Review:    CBC  Recent Labs Lab 02/20/16 1732 02/21/16 0513 02/22/16 0521 02/23/16 0159  WBC 7.7 8.5 7.8 7.5  HGB 15.1 14.3 13.9 13.6  HCT 44.6 43.9 43.2 42.3  PLT 188 189 183 176  MCV 87.6 88.5 89.3 89.1  MCH 29.7 28.8 28.7 28.6  MCHC 33.9 32.6 32.2 32.2  RDW 13.7 14.0 14.2 13.9    Chemistries   Recent Labs Lab 02/20/16 1732  NA 139  K 3.7  CL 110  CO2 22  GLUCOSE 109*  BUN 14  CREATININE 1.15  CALCIUM 9.1   ------------------------------------------------------------------------------------------------------------------ No results for input(s): CHOL, HDL, LDLCALC, TRIG, CHOLHDL, LDLDIRECT in the last 72 hours.  No results found for: HGBA1C ------------------------------------------------------------------------------------------------------------------ No results for input(s): TSH,  T4TOTAL, T3FREE, THYROIDAB in the last 72 hours.  Invalid input(s): FREET3 ------------------------------------------------------------------------------------------------------------------ No results for input(s): VITAMINB12, FOLATE, FERRITIN, TIBC, IRON, RETICCTPCT in the last 72 hours.  Coagulation profile  Recent Labs Lab 02/20/16 2309  INR 1.11    No results for input(s): DDIMER in the last 72 hours.  Cardiac Enzymes No results for input(s): CKMB, TROPONINI, MYOGLOBIN in the last 168 hours.  Invalid input(s): CK ------------------------------------------------------------------------------------------------------------------ No results found for: BNP  Micro Results Recent Results (from the past 240 hour(s))  MRSA PCR Screening     Status: None   Collection Time: 02/21/16  1:15 AM  Result Value Ref Range Status   MRSA by PCR NEGATIVE NEGATIVE Final    Comment:        The GeneXpert MRSA Assay (FDA approved for NASAL specimens only), is one component of a comprehensive MRSA colonization surveillance program. It is not intended to diagnose MRSA infection nor to guide or monitor treatment for MRSA infections.     Radiology Reports Dg Chest 2 View  Result Date: 02/20/2016 CLINICAL DATA:  Left-sided chest pain, shortness of breath for 4 days EXAM: CHEST  2 VIEW COMPARISON:  11/29/2014 FINDINGS: The heart size and mediastinal contours are within normal limits. Both lungs are clear. The visualized skeletal structures are unremarkable. IMPRESSION: No active cardiopulmonary disease. Electronically Signed   By: Elige Ko   On: 02/20/2016 18:05  Ct Angio Chest Pe W Or Wo Contrast  Result Date: 02/20/2016 CLINICAL DATA:  Left-sided chest pain and shortness of breath for several days, initial encounter EXAM: CT ANGIOGRAPHY CHEST WITH CONTRAST TECHNIQUE: Multidetector CT imaging of the chest was performed using the standard protocol during bolus administration of intravenous  contrast. Multiplanar CT image reconstructions and MIPs were obtained to evaluate the vascular anatomy. CONTRAST:  100 mL Isovue 370. COMPARISON:  None. FINDINGS: Mediastinum/Lymph Nodes: No significant lymphadenopathy is identified. The thoracic inlet is within normal limits. Cardiovascular: Pulmonary artery demonstrates a normal branching pattern although large bilateral pulmonary emboli are identified. There are changes consistent with right heart strain with a RV/LV ratio of 1.1. The thoracic aorta show some mild calcifications without aneurysmal dilatation or dissection. Lungs/Pleura: No pulmonary mass, infiltrate, or effusion. Upper abdomen: Gallbladder is been surgically removed. No other focal abnormality is noted in the upper abdomen Musculoskeletal: No acute bony abnormality is noted. Review of the MIP images confirms the above findings. IMPRESSION: Positive for acute bilateral PE with CT evidence of right heart strain (RV/LV Ratio = 1.1) consistent with at least submassive (intermediate risk) PE. The presence of right heart strain has been associated with an increased risk of morbidity and mortality. Please activate Code PE by paging [phone]. Critical Value/emergent results were called by telephone at the time of interpretation on 02/20/2016 at 10:21 pm to Dr. Effie Shy, who verbally acknowledged these results. Electronically Signed   By: Alcide Clever M.D.   On: 02/20/2016 22:21   Time Spent in minutes  30   Sherrita Riederer K M.D on 02/23/2016 at 10:46 AM  Between 7am to 7pm - Pager - [phone]  After 7pm go to [email or web address] - password [facility]  [facility] -  Office  [phone]

## 2016-02-23 NOTE — Progress Notes (Addendum)
ANTICOAGULATION CONSULT NOTE - Follow Up Consult  Pharmacy Consult for Heparin Indication: pulmonary embolus, deep vein thombosis  Allergies  Allergen Reactions  . Penicillins Itching and Other (See Comments)    Has patient had a PCN reaction causing immediate rash, facial/tongue/throat swelling, SOB or lightheadedness with hypotension: No Has patient had a PCN reaction causing severe rash involving mucus membranes or skin necrosis: No Has patient had a PCN reaction that required hospitalization No Has patient had a PCN reaction occurring within the last 10 years: No If all of the above answers are "NO", then may proceed with Cephalosporin use.    Patient Measurements: Height: 5\' 11"  (180.3 cm) Weight: (!) 420 lb (190.5 kg) IBW/kg (Calculated) : 75.3 Heparin Dosing Weight: 126 kg  Vital Signs: Temp: 97.3 F (36.3 C) (07/29 0451) Temp Source: Oral (07/29 0451) BP: 136/79 (07/29 0451) Pulse Rate: 64 (07/29 0451)  Labs:  Recent Labs  02/20/16 1732 02/20/16 2309 02/21/16 0513  02/21/16 2218 02/22/16 0521 02/23/16 0159 02/23/16 0800  HGB 15.1  --  14.3  --   --  13.9 13.6  --   HCT 44.6  --  43.9  --   --  43.2 42.3  --   PLT 188  --  189  --   --  183 176  --   APTT  --  32  --   --   --   --  90* 118*  LABPROT  --  14.3  --   --   --   --   --   --   INR  --  1.11  --   --   --   --   --   --   HEPARINUNFRC  --   --  0.78*  < > 0.40  --  0.93* 0.82*  CREATININE 1.15  --   --   --   --   --   --   --   < > = values in this interval not displayed.  Estimated Creatinine Clearance: 104.1 mL/min (by C-G formula based on SCr of 1.15 mg/dL).   Assessment: 70 yoM presented to ED on 7/26 with c/o cough, dyspnea.  PMH includes DVT treated with warfarin in 2009, morbid obesity, OSA, and HTN.  CT angio shows acute bilateral PE with CT evidence of right heart strain. Lower extremity venous duplex confirms left leg extensive DVT. Pharmacy has been consulted to dose  Heparin.  02/22/16:  Planned transition to Apixaban today and patient received dose of 10mg  PO x 1 this AM at 0732. Patient, however, still short of breath today and MD would like to resume heparin for the next 24 hours.  Heparin resumed ~ 12 hours after apixaban dose per d/w MD.  Today, 02/23/2016:  CBC:  Hgb and Pltc remain stable and WNL  No bleeding issues or complications of therapy reported per nursing  SCr 1.15 (7/26) with CrCl > 100 ml/min  Heparin level = 0.82 units/mL, aPTT 118 seconds-both supratherapeutic  Goal of Therapy:  Heparin level 0.3-0.7 units/ml  APTT 66-102 seconds Monitor platelets by anticoagulation protocol: Yes   Plan:   Decrease heparin infusion to 1250 units/hr.  Check heparin level, aPTT 6 hours after rate change.   Daily CBC, HL.  Monitor closely for s/s of bleeding.  F/u plans to transition to oral anticoagulation.    Greer Pickerel, PharmD, BCPS Pager: [phone] 02/23/2016 10:28 AM

## 2016-02-23 NOTE — Progress Notes (Signed)
Patient's O2 sat checked on Room Air at rest was 95%. Ambulated 80 ft in hallway and O2 sat dropped to 55% on room air. Patient states he is SOB but "better than yesterday". No obvious signs of distress other than patient's statement.  Assisted back to chair and replaced O2 at 2l/min and sat back to 95% after a min. Melton Alar, RN

## 2016-02-23 NOTE — Progress Notes (Signed)
ANTICOAGULATION CONSULT NOTE - Follow Up Consult  Pharmacy Consult for Heparin Indication: pulmonary embolus, deep vein thombosis  Allergies  Allergen Reactions  . Penicillins Itching and Other (See Comments)    Has patient had a PCN reaction causing immediate rash, facial/tongue/throat swelling, SOB or lightheadedness with hypotension: No Has patient had a PCN reaction causing severe rash involving mucus membranes or skin necrosis: No Has patient had a PCN reaction that required hospitalization No Has patient had a PCN reaction occurring within the last 10 years: No If all of the above answers are "NO", then may proceed with Cephalosporin use.    Patient Measurements: Height:  (180.3 cm) Weight: (!) 420 lb (190.5 kg) IBW/kg (Calculated) : 75.3 Heparin Dosing Weight: 126 kg  Vital Signs: Temp: 97.8 F (36.6 C) (07/29 1307) Temp Source: Oral (07/29 1307) BP: 121/68 (07/29 1307) Pulse Rate: 67 (07/29 1307)  Labs:  Recent Labs  02/20/16 2309  02/21/16 0513  02/22/16 0521 02/23/16 0159 02/23/16 0800 02/23/16 1749  HGB  --   < > 14.3  --  13.9 13.6  --   --   HCT  --   --  43.9  --  43.2 42.3  --   --   PLT  --   --  189  --  183 176  --   --   APTT 32  --   --   --   --  90* 118* 88*  LABPROT 14.3  --   --   --   --   --   --   --   INR 1.11  --   --   --   --   --   --   --   HEPARINUNFRC  --   --  0.78*  < >  --  0.93* 0.82* 0.57  < > = values in this interval not displayed.  Estimated Creatinine Clearance: 104.1 mL/min (by C-G formula based on SCr of 1.15 mg/dL).   Assessment: 54 yoM presented to ED on 7/26 with c/o cough, dyspnea.  PMH includes DVT treated with warfarin in 2009, morbid obesity, OSA, and HTN.  CT angio shows acute bilateral PE with CT evidence of right heart strain. Lower extremity venous duplex confirms left leg extensive DVT. Pharmacy has been consulted to dose Heparin.  02/22/16:  Planned transition to Apixaban today and patient received  dose of  PO x 1 this AM at 0732. Patient, however, still short of breath today and MD would like to resume heparin for the next 24 hours.  Heparin resumed ~ 12 hours after apixaban dose per d/w MD.  Today, 02/23/2016:  As of this morning, patient still extremely hypoxic on exertion, so MD would like to continue heparin for another 24 hours.  CBC:  Hgb and Pltc remain stable and WNL  No bleeding issues or complications of therapy reported per nursing  SCr 1.15 (7/26) with CrCl > 100 ml/min  Heparin level = 0.57 units/mL, aPTT 88 seconds-both now within goal range  Goal of Therapy:  Heparin level 0.3-0.7 units/ml  APTT 66-102 seconds Monitor platelets by anticoagulation protocol: Yes   Plan:   Continue heparin infusion at 1250 units/hr.  Check heparin level 6 hours after last level. Will stop checking aPTT as heparin level and aPTT now correlate and thus effects of apixaban on HL likely have diminished.    Daily CBC, heparin level.  Monitor closely for s/s of bleeding.  F/u plans to transition  to oral anticoagulation when clinically appropriate.  Greer Pickerel, PharmD, BCPS Pager: [phone] 02/23/2016 7:57 PM

## 2016-02-24 LAB — CBC
HEMATOCRIT: 44 % (ref 39.0–52.0)
Hemoglobin: 14.3 g/dL (ref 13.0–17.0)
MCH: 29.1 pg (ref 26.0–34.0)
MCHC: 32.5 g/dL (ref 30.0–36.0)
MCV: 89.4 fL (ref 78.0–100.0)
Platelets: 189 10*3/uL (ref 150–400)
RBC: 4.92 MIL/uL (ref 4.22–5.81)
RDW: 13.6 % (ref 11.5–15.5)
WBC: 6.4 10*3/uL (ref 4.0–10.5)

## 2016-02-24 LAB — HEPARIN LEVEL (UNFRACTIONATED)
Heparin Unfractionated: 0.44 IU/mL (ref 0.30–0.70)
Heparin Unfractionated: 0.44 IU/mL (ref 0.30–0.70)

## 2016-02-24 MED ORDER — APIXABAN 5 MG PO TABS
10.0000 mg | ORAL_TABLET | Freq: Two times a day (BID) | ORAL | Status: DC
  Administered 2016-02-24: 10 mg via ORAL

## 2016-02-24 MED ORDER — APIXABAN 5 MG PO TABS
10.0000 mg | ORAL_TABLET | Freq: Two times a day (BID) | ORAL | Status: DC
  Filled 2016-02-24: qty 2

## 2016-02-24 MED ORDER — APIXABAN 5 MG PO TABS: 5.0000 mg | ORAL_TABLET | Freq: Two times a day (BID) | ORAL | Status: DC

## 2016-02-24 MED ORDER — APIXABAN 5 MG PO TABS: ORAL_TABLET | ORAL | 0 refills | Status: DC

## 2016-02-24 NOTE — Progress Notes (Signed)
PHARMACY - HEPARIN (brief note)  Patient on IV heparin for pulmonary embolism, DVT  Heparin currently infusing @ 1250 units/hr Previous aPTT & heparin level therapeutic Repeat heparin level = 0.44 (Goal 0.3-0.7) No complications of therapy noted  Plan: Continue IV heparin @ 1250 units/hr Check heparin level & CBC daily  Terrilee Files, PharmD 02/24/16 @ 00:51

## 2016-02-24 NOTE — Discharge Instructions (Addendum)
Follow with Primary MD Cala Bradford, MD in 2-3 days   Get CBC, CMP, 2 view Chest X ray checked  by Primary MD or SNF MD in 5-7 days ( we routinely change or add medications that can affect your baseline labs and fluid status, therefore we recommend that you get the mentioned basic workup next visit with your PCP, your PCP may decide not to get them or add new tests based on their clinical decision)   Activity: As tolerated with Full fall precautions use walker/cane & assistance as needed   Disposition Home      Diet:   Heart Healthy    For Heart failure patients - Check your Weight same time everyday, if you gain over 2 pounds, or you develop in leg swelling, experience more shortness of breath or chest pain, call your Primary MD immediately. Follow Cardiac Low Salt Diet and 1.5 lit/day fluid restriction.   On your next visit with your primary care physician please Get Medicines reviewed and adjusted.   Please request your Prim.MD to go over all Hospital Tests and Procedure/Radiological results at the follow up, please get all Hospital records sent to your Prim MD by signing hospital release before you go home.   If you experience worsening of your admission symptoms, develop shortness of breath, life threatening emergency, suicidal or homicidal thoughts you must seek medical attention immediately by calling 911 or calling your MD immediately  if symptoms less severe.  You Must read complete instructions/literature along with all the possible adverse reactions/side effects for all the Medicines you take and that have been prescribed to you. Take any new Medicines after you have completely understood and accpet all the possible adverse reactions/side effects.   Do not drive, operate heavy machinery, perform activities at heights, swimming or participation in water activities or provide baby sitting services if your were admitted for syncope or siezures until you have seen by Primary MD  or a Neurologist and advised to do so again.  Do not drive when taking Pain medications.    Do not take more than prescribed Pain, Sleep and Anxiety Medications  Special Instructions: If you have smoked or chewed Tobacco  in the last 2 yrs please stop smoking, stop any regular Alcohol  and or any Recreational drug use.  Wear Seat belts while driving.   Please note  You were cared for by a hospitalist during your hospital stay. If you have any questions about your discharge medications or the care you received while you were in the hospital after you are discharged, you can call the unit and asked to speak with the hospitalist on call if the hospitalist that took care of you is not available. Once you are discharged, your primary care physician will handle any further medical issues. Please note that NO REFILLS for any discharge medications will be authorized once you are discharged, as it is imperative that you return to your primary care physician (or establish a relationship with a primary care physician if you do not have one) for your aftercare needs so that they can reassess your need for medications and monitor your lab values.   Information on my medicine - ELIQUIS (apixaban)  This medication education was reviewed with me or my healthcare representative as part of my discharge preparation.  The pharmacist that spoke with me during my hospital stay was:  Jamse Mead [facility]  Why was Eliquis prescribed for you? Eliquis was prescribed to treat blood clots that  may have been found in the veins of your legs (deep vein thrombosis) or in your lungs (pulmonary embolism) and to reduce the risk of them occurring again.  What do You need to know about Eliquis ? The starting dose is 10 mg (two 5 mg tablets) taken TWICE daily for the FIRST SEVEN (7) DAYS, then on DAY 8, the dose is reduced to ONE 5 mg tablet taken TWICE daily.  Eliquis may be taken with or without food.   Try to take the  dose about the same time in the morning and in the evening. If you have difficulty swallowing the tablet whole please discuss with your pharmacist how to take the medication safely.  Take Eliquis exactly as prescribed and DO NOT stop taking Eliquis without talking to the doctor who prescribed the medication.  Stopping may increase your risk of developing a new blood clot.  Refill your prescription before you run out.  After discharge, you should have regular check-up appointments with your healthcare provider that is prescribing your Eliquis.    What do you do if you miss a dose? If a dose of ELIQUIS is not taken at the scheduled time, take it as soon as possible on the same day and twice-daily administration should be resumed. The dose should not be doubled to make up for a missed dose.  Important Safety Information A possible side effect of Eliquis is bleeding. You should call your healthcare provider right away if you experience any of the following: ? Bleeding from an injury or your nose that does not stop. ? Unusual colored urine (red or dark brown) or unusual colored stools (red or black). ? Unusual bruising for unknown reasons. ? A serious fall or if you hit your head (even if there is no bleeding).  Some medicines may interact with Eliquis and might increase your risk of bleeding or clotting while on Eliquis. To help avoid this, consult your healthcare provider or pharmacist prior to using any new prescription or non-prescription medications, including herbals, vitamins, non-steroidal anti-inflammatory drugs (NSAIDs) and supplements.  This website has more information on Eliquis (apixaban): http://www.eliquis.com/eliquis/home

## 2016-02-24 NOTE — Care Management Note (Signed)
Case Management Note  Patient Details  Name: Richard Sandoval MRN: [id number] Date of Birth: 1946/04/12  Subjective/Objective:   Bilateral submassive PE                  Action/Plan: Discharge Planning:  NCM spoke to pt and states he lives at home with wife. States he recently had a updated sleep study. He was not able to get his new CPAP due to high deductible. He will follow up with [facility] this week to schedule delivery. Contacted [facility] for oxygen for home. Provided pt with Eliquis 30 day free trial card and $10 copay card.   1600 NCM contacted pt and states [facility] did deliver oxygen. States his pharmacy had Eliquis in stock.   PCP Laurann Montana MD  Expected Discharge Date:  02/24/16              Expected Discharge Plan:  Home/Self Care  In-House Referral:  NA  Discharge planning Services  CM Consult, Medication Assistance  Post Acute Care Choice:    Choice offered to:  NA  DME Arranged:  N/A DME Agency:  NA  HH Arranged:  NA HH Agency:  NA  Status of Service:  Completed, signed off  If discussed at Long Length of Stay Meetings, dates discussed:    Additional Comments:  Elliot Cousin, RN 02/24/2016, 4:08 PM

## 2016-02-24 NOTE — Progress Notes (Addendum)
ANTICOAGULATION CONSULT NOTE - Follow Up Consult  Pharmacy Consult for Heparin --> Apixaban Indication: pulmonary embolus, deep vein thombosis  Allergies  Allergen Reactions  . Penicillins Itching and Other (See Comments)    Has patient had a PCN reaction causing immediate rash, facial/tongue/throat swelling, SOB or lightheadedness with hypotension: No Has patient had a PCN reaction causing severe rash involving mucus membranes or skin necrosis: No Has patient had a PCN reaction that required hospitalization No Has patient had a PCN reaction occurring within the last 10 years: No If all of the above answers are "NO", then may proceed with Cephalosporin use.    Patient Measurements: Height:  (180.3 cm) Weight: (!) 420 lb (190.5 kg) IBW/kg (Calculated) : 75.3 Heparin Dosing Weight: 126 kg  Vital Signs: Temp: 96.4 F (35.8 C) (07/30 0645) Temp Source: Axillary (07/30 0645) BP: 142/87 (07/30 0645) Pulse Rate: 56 (07/30 0645)  Labs:  Recent Labs  02/22/16 0521 02/23/16 0159 02/23/16 0800 02/23/16 1749 02/23/16 2347 02/24/16 0001 02/24/16 0515  HGB 13.9 13.6  --   --   --  14.3  --   HCT 43.2 42.3  --   --   --  44.0  --   PLT 183 176  --   --   --  189  --   APTT  --  90* 118* 88*  --   --   --   HEPARINUNFRC  --  0.93* 0.82* 0.57 0.44  --  0.44    Estimated Creatinine Clearance: 104.1 mL/min (by C-G formula based on SCr of 1.15 mg/dL).   Assessment: 89 yoM presented to ED on 7/26 with c/o cough, dyspnea.  PMH includes DVT treated with warfarin in 2009, morbid obesity, OSA, and HTN.  CT angio shows acute bilateral PE with CT evidence of right heart strain. Lower extremity venous duplex confirms left leg extensive DVT. Pharmacy has been consulted to dose Heparin.  02/22/16:  Planned transition to Apixaban today and patient received dose of  PO x 1 this AM at 0732. Patient, however, still short of breath today and MD would like to resume heparin for the next 24  hours.  Heparin resumed ~ 12 hours after apixaban dose per d/w MD.  02/23/16:  Heparin infusion continued per MD, as patient still extremely hypoxic on exertion  Today, 02/24/2016:  CBC:  Hgb and Pltc remain stable and WNL  No bleeding issues or complications of therapy reported per nursing  SCr 1.15 (7/26) with CrCl > 100 ml/min  Heparin level = 0.44 units/mL, remains within goal range  Goal of Therapy:  Heparin level 0.3-0.7 units/ml  Monitor platelets by anticoagulation protocol: Yes   Plan:   Continue heparin infusion at 1250 units/hr.  Daily CBC, heparin level.  Monitor closely for s/s of bleeding.  F/u plans to transition to oral anticoagulation when clinically appropriate.    Greer Pickerel, PharmD, BCPS Pager: [phone] 02/24/2016 8:08 AM   Addendum:  Pharmacy consulted to transition patient from heparin to apixaban for discharge.   D/C heparin infusion (done ~ 1100).  Start Apixaban  PO BID x 7 days, then decrease to  BID. First dose given this AM prior to discharge. (Received therapeutic anticoagulation for ~ 3.5 days in hospital. However, given significant clot burden and hypoxia on exertion, will plan for initial full 7 days of higher dosing of Apixaban per d/w Dr. Thedore Mins. CBC has remained stable and WNL with no bleeding)  Monitor closely for bleeding.  Education provided  to patient.   Greer Pickerel, PharmD, BCPS Pager: [phone] 02/24/2016 1:31 PM

## 2016-02-24 NOTE — Progress Notes (Signed)
SATURATION QUALIFICATIONS: (This note is used to comply with regulatory documentation for home oxygen)  Patient Saturations on Room Air at Rest = 95%  Patient Saturations on Room Air while Ambulating = 55%  Patient Saturations on 2 Liters of oxygen while Ambulating = 95%  Please briefly explain why patient needs home oxygen:

## 2016-02-24 NOTE — Progress Notes (Signed)
Contacted [facility] for oxygen for home. Provided pt with Eliquis 30 day free trial card and copay card. Isidoro Donning RN CCM Case Mgmt phone [phone]

## 2016-02-24 NOTE — Discharge Summary (Addendum)
Robb Sibal ZOX:[id number] DOB: 12-07-1945 DOA: 02/20/2016  PCP: Cala Bradford, MD  Admit date: 02/20/2016  Discharge date: 02/24/2016  Admitted From: Home  Disposition:  Home   Recommendations for Outpatient Follow-up:   Follow up with PCP in 1-2 weeks  PCP Please obtain BMP/CBC, 2 view CXR in 1week,  (see Discharge instructions)   PCP Please follow up on the following pending results: None   Home Health: None   Equipment/Devices: 2lit-o2 Consultations: None Discharge Condition: Stable   CODE STATUS: Full   Diet Recommendation: Heart Healthy   Chief Complaint  Patient presents with  . Shortness of Breath  . Chest Pain  . Cough     Brief history of present illness from the day of admission and additional interim summary    Patient is a 70 yo male with history of morbid obesity, DVT tx with coumadin in 2009, OSA on CPAP, who came with cc of cough, dyspnea on exertion for a week with new onset chest pain today mainly with cough and deep breathing. No increasing swelling in LEs or pain. No recent travel or surgery. He ambulates as tolerated but spend most of his time sedentary. He has hx of DVT that was treated for coumadin that was stopped in 2009. No history of GI bleed or bleeding disorder  Hospital issues addressed     1.Bilateral submassive PE on CT angiogram with acute hypoxic respiratory failure. Patient with history of DVT in 2009. As treated with heparin drip with good effect now transitioned to Eliquis, lower extremity venous duplex confirms left leg extensive DVT, echocardiogram stable except for moderate pulmonary hypertension which could be due to underlying morbid obesity and obstructive sleep apnea as well, no previous echocardiograms in the chart, case management already consulted to arrange  for home Eliquis.   Of note he is a morbidly obese gentleman with underlying obstructive sleep apnea and possibly undiagnosed obesity related hypoventilation syndrome, patient informs me that even on a good day he could hardly walk more than 2 blocks, he is completely symptom free at rest and on 1/2 L nasal cannula oxygen at rest his oxygen is 100% and heart rate in mid 50s. Upon exertion he does get severely hypoxic however I think this is due to combination of his extreme deconditioning, morbid obesity and now PE. He will qualify for home oxygen and will be provided same.   We'll request him to follow with PCP and pulmonary outpatient for outpatient monitoring, he has been extensively counseled to lose at least 5200 pounds of weight.   2. Morbid obesity with obstructive sleep apnea. CPAP at night, follow with PCP for weight loss.  3. GERD. On Pepcid.  4. Seasonal allergies. Continue loratadine.  5. Chronic diastolic CHF with EF 55%. Compensated.  6. Moderate pulmonary hypertension. Likely due to morbid obesity with obstructive sleep apnea. Some increase in pressures could be due to his acute PE as well. Continue treatment as above.   Discharge diagnosis     Principal Problem:   Bilateral  pulmonary embolism (HCC) Active Problems:   OSA (obstructive sleep apnea)   Morbid obesity (HCC)   GERD (gastroesophageal reflux disease)    Discharge instructions    Discharge Instructions    Diet - low sodium heart healthy    Complete by:  As directed   Discharge instructions    Complete by:  As directed   Follow with Primary MD Cala Bradford, MD in 2-3 days   Get CBC, CMP, 2 view Chest X ray checked  by Primary MD or SNF MD in 5-7 days ( we routinely change or add medications that can affect your baseline labs and fluid status, therefore we recommend that you get the mentioned basic workup next visit with your PCP, your PCP may decide not to get them or add new tests based on their  clinical decision)   Activity: As tolerated with Full fall precautions use walker/cane & assistance as needed   Disposition Home     Diet:   Heart Healthy    For Heart failure patients - Check your Weight same time everyday, if you gain over 2 pounds, or you develop in leg swelling, experience more shortness of breath or chest pain, call your Primary MD immediately. Follow Cardiac Low Salt Diet and 1.5 lit/day fluid restriction.   On your next visit with your primary care physician please Get Medicines reviewed and adjusted.   Please request your Prim.MD to go over all Hospital Tests and Procedure/Radiological results at the follow up, please get all Hospital records sent to your Prim MD by signing hospital release before you go home.   If you experience worsening of your admission symptoms, develop shortness of breath, life threatening emergency, suicidal or homicidal thoughts you must seek medical attention immediately by calling 911 or calling your MD immediately  if symptoms less severe.  You Must read complete instructions/literature along with all the possible adverse reactions/side effects for all the Medicines you take and that have been prescribed to you. Take any new Medicines after you have completely understood and accpet all the possible adverse reactions/side effects.   Do not drive, operate heavy machinery, perform activities at heights, swimming or participation in water activities or provide baby sitting services if your were admitted for syncope or siezures until you have seen by Primary MD or a Neurologist and advised to do so again.  Do not drive when taking Pain medications.    Do not take more than prescribed Pain, Sleep and Anxiety Medications  Special Instructions: If you have smoked or chewed Tobacco  in the last 2 yrs please stop smoking, stop any regular Alcohol  and or any Recreational drug use.  Wear Seat belts while driving.   Please note  You were  cared for by a hospitalist during your hospital stay. If you have any questions about your discharge medications or the care you received while you were in the hospital after you are discharged, you can call the unit and asked to speak with the hospitalist on call if the hospitalist that took care of you is not available. Once you are discharged, your primary care physician will handle any further medical issues. Please note that NO REFILLS for any discharge medications will be authorized once you are discharged, as it is imperative that you return to your primary care physician (or establish a relationship with a primary care physician if you do not have one) for your aftercare needs so that they can reassess your need for medications and  monitor your lab values.   Increase activity slowly    Complete by:  As directed      Discharge Medications     Medication List    TAKE these medications   apixaban 5 MG Tabs tablet Commonly known as:  ELIQUIS PO - Take 10 mg twice a day for one 5 days then switch to 5 mg twice a day, provide one-month supply dispense as needed. Thereafter PCP to provide more refills.   aspirin EC 81 MG tablet Take 81 mg by mouth daily.   cetirizine 10 MG tablet Commonly known as:  ZYRTEC Take 10 mg by mouth daily as needed for allergies.   HYDROcodone-acetaminophen 5-325 MG tablet Commonly known as:  NORCO/VICODIN Take 1-2 tablets by mouth every 4 (four) hours as needed for moderate pain.   ranitidine 150 MG tablet Commonly known as:  ZANTAC Take 150 mg by mouth at bedtime.       Follow-up Information    WHITE,[name], MD. Schedule an appointment as soon as possible for a visit in 2 day(s).   Specialty:  Family Medicine Contact information: [phone] [address] [place] [address] [phone]        [facility], MD. Schedule an appointment as soon as possible for a visit in 1 week(s).   Specialty:  Pulmonary Disease Why:  OSA, PE,  OHS Contact information: [address] [place] [address] [phone]        Inc[facility] - [facility] .   Why:  will deliver oxygen concentrator to home Contact information: [address] [place] [address] [phone]           Major procedures and Radiology Reports - PLEASE review detailed and final reports thoroughly  -      CT angiogram of the chest. submassive bilateral PE  Echocardiogram - Left ventricle: The cavity size was normal. There was moderateconcentric hypertrophy. Systolic function was normal. Theestimated ejection fraction was in the range of 50% to 55%.Challenging to estimate, even with Definity. Wall motion wasnormal; there were no regional wall motion abnormalities. Dopplerparameters are consistent with abnormal left ventricularrelaxation (grade 1 diastolic dysfunction). - Ventricular septum: The contour showed diastolic flattening. - Left atrium: The atrium was moderately dilated. - Right ventricle: The cavity size was moderately dilated. Wallthickness was normal. Systolic function was moderately reduced. - Right atrium: The atrium was severely dilated. - Pulmonary arteries: PA peak pressure: 52 mm Hg (S).  Lower extremity venous duplex. Extensive left leg DVT   Dg Chest 2 View  Result Date: 02/20/2016 CLINICAL DATA:  Left-sided chest pain, shortness of breath for 4 days EXAM: CHEST  2 VIEW COMPARISON:  11/29/2014 FINDINGS: The heart size and mediastinal contours are within normal limits. Both lungs are clear. The visualized skeletal structures are unremarkable. IMPRESSION: No active cardiopulmonary disease. Electronically Signed   By: Elige Ko   On: 02/20/2016 18:05  Ct Angio Chest Pe W Or Wo Contrast  Result Date: 02/20/2016 CLINICAL DATA:  Left-sided chest pain and shortness of breath for several days, initial encounter EXAM: CT ANGIOGRAPHY CHEST WITH CONTRAST TECHNIQUE: Multidetector CT imaging of the chest was  performed using the standard protocol during bolus administration of intravenous contrast. Multiplanar CT image reconstructions and MIPs were obtained to evaluate the vascular anatomy. CONTRAST:  100 mL Isovue 370. COMPARISON:  None. FINDINGS: Mediastinum/Lymph Nodes: No significant lymphadenopathy is identified. The thoracic inlet is within normal limits. Cardiovascular: Pulmonary artery demonstrates a normal branching  pattern although large bilateral pulmonary emboli are identified. There are changes consistent with right heart strain with a RV/LV ratio of 1.1. The thoracic aorta show some mild calcifications without aneurysmal dilatation or dissection. Lungs/Pleura: No pulmonary mass, infiltrate, or effusion. Upper abdomen: Gallbladder is been surgically removed. No other focal abnormality is noted in the upper abdomen Musculoskeletal: No acute bony abnormality is noted. Review of the MIP images confirms the above findings. IMPRESSION: Positive for acute bilateral PE with CT evidence of right heart strain (RV/LV Ratio = 1.1) consistent with at least submassive (intermediate risk) PE. The presence of right heart strain has been associated with an increased risk of morbidity and mortality. Please activate Code PE by paging [phone]. Critical Value/emergent results were called by telephone at the time of interpretation on 02/20/2016 at 10:21 pm to Dr. Effie Shy, who verbally acknowledged these results. Electronically Signed   By: Alcide Clever M.D.   On: 02/20/2016 22:21   Micro Results     Recent Results (from the past 240 hour(s))  MRSA PCR Screening     Status: None   Collection Time: 02/21/16  1:15 AM  Result Value Ref Range Status   MRSA by PCR NEGATIVE NEGATIVE Final    Comment:        The GeneXpert MRSA Assay (FDA approved for NASAL specimens only), is one component of a comprehensive MRSA colonization surveillance program. It is not intended to diagnose MRSA infection nor to guide or monitor  treatment for MRSA infections.     Today   Subjective    Correy Bourcier today has no headache,no chest abdominal pain,no new weakness tingling or numbness, feels much better wants to go home today.    Objective   Blood pressure (!) 142/87, pulse (!) 56, temperature (!) 96.4 F (35.8 C), temperature source Axillary, resp. rate 20, height 5\' 11"  (1.803 m), weight (!) 190.5 kg (420 lb), SpO2 100 %.   Intake/Output Summary (Last 24 hours) at 02/24/16 1337 Last data filed at 02/23/16 1501  Gross per 24 hour  Intake              240 ml  Output                0 ml  Net              240 ml    Exam Morbidly obese African-American male in no distress Awake Alert, Oriented x 3, No new F.N deficits, Normal affect [place].AT,PERRAL Supple Neck,No JVD, No cervical lymphadenopathy appriciated.  Symmetrical Chest wall movement, Good air movement bilaterally, CTAB RRR,No Gallops,Rubs or new Murmurs, No Parasternal Heave +ve B.Sounds, Abd Soft, Non tender, No organomegaly appriciated, No rebound -guarding or rigidity. No Cyanosis, Clubbing or edema, No new Rash or bruise   Data Review   CBC w Diff:  Lab Results  Component Value Date   WBC 6.4 02/24/2016   HGB 14.3 02/24/2016   HCT 44.0 02/24/2016   PLT 189 02/24/2016   LYMPHOPCT 18 01/21/2014   MONOPCT 5 01/21/2014   EOSPCT 1 01/21/2014   BASOPCT 0 01/21/2014    CMP:  Lab Results  Component Value Date   NA 139 02/20/2016   K 3.7 02/20/2016   CL 110 02/20/2016   CO2 22 02/20/2016   BUN 14 02/20/2016   CREATININE 1.15 02/20/2016   PROT 6.5 01/25/2014   ALBUMIN 2.7 (L) 01/25/2014   BILITOT 1.3 (H) 01/25/2014   ALKPHOS 78 01/25/2014   AST 34 01/25/2014  ALT 71 (H) 01/25/2014  .   Total Time in preparing paper work, data evaluation and todays exam - 35 minutes  Leroy Sea M.D on 02/24/2016 at 1:37 PM  [facility]   Office  [phone]

## 2016-02-25 ENCOUNTER — Telehealth: Payer: Self-pay | Admitting: Internal Medicine

## 2016-02-25 NOTE — Telephone Encounter (Signed)
Spoke with the pt and scheduled appt with MW for 03/03/16 at 12 noon

## 2016-02-26 DIAGNOSIS — R0609 Other forms of dyspnea: Secondary | ICD-10-CM | POA: Diagnosis not present

## 2016-03-03 ENCOUNTER — Ambulatory Visit (INDEPENDENT_AMBULATORY_CARE_PROVIDER_SITE_OTHER): Payer: Managed Care, Other (non HMO) | Admitting: Internal Medicine

## 2016-03-03 ENCOUNTER — Encounter: Payer: Self-pay | Admitting: Internal Medicine

## 2016-03-03 VITALS — BP 122/70 | HR 69 | Ht 71.0 in | Wt >= 6400 oz

## 2016-03-03 DIAGNOSIS — I2699 Other pulmonary embolism without acute cor pulmonale: Secondary | ICD-10-CM | POA: Diagnosis not present

## 2016-03-03 DIAGNOSIS — G4733 Obstructive sleep apnea (adult) (pediatric): Secondary | ICD-10-CM

## 2016-03-03 DIAGNOSIS — R058 Other specified cough: Secondary | ICD-10-CM

## 2016-03-03 DIAGNOSIS — R05 Cough: Secondary | ICD-10-CM | POA: Diagnosis not present

## 2016-03-03 DIAGNOSIS — R0600 Dyspnea, unspecified: Secondary | ICD-10-CM

## 2016-03-03 NOTE — Patient Instructions (Signed)
If you are concerned about the cough  Try prilosec   Take 30-60 min before first meal of the day and Pepcid 20 mg one bedtime with  zyrtrec  X 2 weeks and if not better continue the medication as is but [name] at [phone] for asthma challenge test   For now use 02 2lpm with cpap and as needed during the day   Please schedule a follow up office visit in 6 weeks, call sooner if needed

## 2016-03-03 NOTE — Progress Notes (Signed)
Subjective:    Patient ID: Richard Sandoval, male    DOB: April 20, 1946  MRN: [id number]    Brief patient profile:  [id number]  yobm never smoker with MO complicated by OSA on cpap x around 1995 referred to pulmonary clinic 09/27/2013 by Dr Richard Sandoval White for eval of new cough sice around Christmas 2014   History of Present Illness  09/27/2013 1st [facility] office visit/ Richard Sandoval cc new onset cough like a gagging sensation x 2 months comes and goes but happens every day worse when in recliner after supper never brings up anything. Sob is baseline unless coughing.  No better p zyrtec. Onset was insidious, pattern is persistent. Rec Pepcid ac 20 mg after supper along with chlortrimeton 4 mg one after supper  GERD diet.     12/14/2013 f/u ov/Richard Sandoval re:  Doe x long corridor slow pace x 2-3 years,  Cough sev years but comes and goes and no longer present p supper in recliner resolved on above rx but always present with temp change x years and almost constant urge to clear throat but daytime only for decades, not using zyrtec as rec, uses lots of mints and cough drops instead. rec The best treatment for your condition sugarless candy If the throat continues to bother you : Try prilosec 20mg   Take 30-60 min before first meal of the day and Pepcid 20 mg one bedtime with  zyrtrec  X 2 weeks and if not better continue the medication as is but [name] at [phone] for asthma challenge test > never done    Admit date: 02/20/2016  Discharge date: 02/24/2016 Principal Problem:   Bilateral pulmonary embolism (HCC) with mod PA pressure elevation  Active Problems:   OSA (obstructive sleep apnea)   Morbid obesity (HCC)   GERD (gastroesophageal reflux disease)     03/03/2016  f/u ov/Richard Sandoval re: chronic cough  morbid obesity/ s/p pe on eliquis / asa 81 mg daily  Chief Complaint  Patient presents with  . Hospitalization Follow-up    Recently hospitalized with PE. His breathing has improved almost back baseline. He has occ cough  with grey sputum.      Body mass index is 58.86 kg/m.    Does ok leaning on cart [employer] on 2lpm  Sleeping on cpap /2lpm per Dr Richard Sandoval Cough continues day > noct assoc with urge to clear throat  No obvious  day to day or daytime variabilty or assoc cp or chest tightness, subjective wheeze overt sinus or hb symptoms. No unusual exp hx or h/o childhood pna/ asthma or knowledge of premature birth.  Sleeping ok without nocturnal  or early am exacerbation  of respiratory  c/o's or need for noct saba. Also denies any obvious fluctuation of symptoms with weather or environmental changes or other aggravating or alleviating factors except as outlined above   Current Medications, Allergies, Complete Past Medical History, Past Surgical History, Family History, and Social History were reviewed in Richard Sandoval Link electronic medical record.  ROS  The following are not active complaints unless bolded sore throat, dysphagia, dental problems, itching, sneezing,  nasal congestion or excess/ purulent secretions, ear ache,   fever, chills, sweats, unintended wt loss, pleuritic or exertional cp, hemoptysis,  orthopnea pnd or leg swelling, presyncope, palpitations, heartburn, abdominal pain, anorexia, nausea, vomiting, diarrhea  or change in bowel or urinary habits, change in stools or urine, dysuria,hematuria,  rash, arthralgias, visual complaints, headache, numbness weakness or ataxia or problems with walking or coordination,  change  in mood/affect or memory.                         Objective:   Physical Exam   03/03/2016          422  12/14/2013        443  Wt Readings from Last 3 Encounters:  09/27/13 440 lb (199.583 kg)  08/04/12 435 lb (197.315 kg)  08/04/12 435 lb (197.315 kg)    Pleasant massively obese  bm nad with freq throat clearing    HEENT: nl dentition, turbinates, and orophanx which is pristine. Nl external ear canals without cough reflex   NECK :  without JVD/Nodes/TM/ nl carotid  upstrokes bilaterally   LUNGS: no acc muscle use, clear to A and P bilaterally without cough on insp or exp maneuvers   CV:  RRR  no s3 or murmur or increase in P2, 1-2 + pitting bilateral lower ext edema   ABD:  soft and nontender with nl excursion in the supine position. No bruits or organomegaly, bowel sounds nl  MS:  warm without deformities, calf tenderness, cyanosis or clubbing  SKIN: warm and dry with severe chronic venous changes both legs   NEURO:  alert, approp, no deficits       I personally reviewed images and agree with radiology impression as follows:  CTa Chest   02/20/16 Positive for acute bilateral PE with CT evidence of right heart strain (RV/LV Ratio = 1.1) consistent with at least submassive (intermediate risk) PE.          Assessment & Plan:   Outpatient Encounter Prescriptions as of 03/03/2016  Medication Sig  . apixaban (ELIQUIS) 5 MG TABS tablet PO - Take 10 mg twice a day for one 5 days then switch to 5 mg twice a day, provide one-month supply dispense as needed. Thereafter PCP to provide more refills.  [name] aspirin EC 81 MG tablet Take 81 mg by mouth daily.  . cetirizine (ZYRTEC) 10 MG tablet Take 10 mg by mouth daily as needed for allergies.   [name] HYDROcodone-acetaminophen (NORCO/VICODIN) 5-325 MG per tablet Take 1-2 tablets by mouth every 4 (four) hours as needed for moderate pain.  . OXYGEN 2lpm 24/7 and with CPAP  [facility]  . ranitidine (ZANTAC) 150 MG tablet Take 150 mg by mouth at bedtime as needed.    No facility-administered encounter medications on file as of 03/03/2016.

## 2016-03-04 ENCOUNTER — Encounter: Payer: Self-pay | Admitting: Internal Medicine

## 2016-03-04 NOTE — Assessment & Plan Note (Signed)
Classic Upper airway cough syndrome, so named because it's frequently impossible to sort out how much is  CR/sinusitis with freq throat clearing (which can be related to primary GERD)   vs  causing  secondary (" extra esophageal")  GERD from wide swings in gastric pressure that occur with throat clearing, often  promoting self use of mint and menthol lozenges that reduce the lower esophageal sphincter tone and exacerbate the problem further in a cyclical fashion.   These are the same pts (now being labeled as having "irritable larynx syndrome" by some cough centers) who not infrequently have a history of having failed to tolerate ace inhibitors,  dry powder inhalers or biphosphonates or report having atypical reflux symptoms that don't respond to standard doses of PPI , and are easily confused as having aecopd or asthma flares by even experienced allergists/ pulmonologists.   rec max rx for gerd then MCT if not satisfied

## 2016-03-04 NOTE — Assessment & Plan Note (Signed)
Body mass index is 58.86 kg/m.  No results found for: TSH   Contributing to gerd tendency/ doe/reviewed the need and the process to achieve and maintain neg calorie balance > defer f/u primary care including intermittently monitoring thyroid status

## 2016-03-04 NOTE — Assessment & Plan Note (Signed)
I had an extended final summary discussion with the patient/wife reviewing all relevant studies completed to date including hosp details and  lasting 15 to 20 minutes of a 25 minute visit on the following issues:    Responding well to rx with eliquis which will be lifelong since this is related to recurrent dvt in pt with severe obesity > Follow up per Primary Care planned  And here prn   No need to repeat CTa or Echo unless directed by new symptoms while on eliquis

## 2016-03-04 NOTE — Assessment & Plan Note (Signed)
Optimal pressure 14cm by titration study 2006> f/u Dr [name]  For now ok to continue 02 2lpm hs only and could try off 02 and on CPAP but best to let Dr Allie Dimmersborn manage both

## 2016-03-04 NOTE — Assessment & Plan Note (Signed)
pfts 12/14/13 nl except for low ERV - 03/03/2016   Walked RA x one lap @ 185 stopped due to  Fatigue, nl pace, no desat   Not limited by sob / desats > no pulmonary f/u needed

## 2016-04-04 ENCOUNTER — Other Ambulatory Visit: Payer: Self-pay | Admitting: *Deleted

## 2016-04-04 ENCOUNTER — Other Ambulatory Visit: Payer: Self-pay | Admitting: Family Medicine

## 2016-04-04 DIAGNOSIS — M79631 Pain in right forearm: Secondary | ICD-10-CM

## 2016-04-07 ENCOUNTER — Ambulatory Visit
Admission: RE | Admit: 2016-04-07 | Discharge: 2016-04-07 | Disposition: A | Discharge status: Home / Self Care | Payer: Managed Care, Other (non HMO) | Source: Ambulatory Visit | Attending: Family Medicine | Admitting: Family Medicine

## 2016-04-07 DIAGNOSIS — M79631 Pain in right forearm: Secondary | ICD-10-CM

## 2016-04-14 ENCOUNTER — Other Ambulatory Visit: Payer: Medicare Other

## 2016-04-14 ENCOUNTER — Ambulatory Visit: Payer: Medicare Other | Admitting: Internal Medicine

## 2016-04-14 ENCOUNTER — Ambulatory Visit (INDEPENDENT_AMBULATORY_CARE_PROVIDER_SITE_OTHER): Payer: Managed Care, Other (non HMO) | Admitting: Internal Medicine

## 2016-04-14 ENCOUNTER — Encounter: Payer: Self-pay | Admitting: Internal Medicine

## 2016-04-14 VITALS — BP 106/74 | HR 65 | Ht 71.0 in | Wt >= 6400 oz

## 2016-04-14 DIAGNOSIS — I2699 Other pulmonary embolism without acute cor pulmonale: Secondary | ICD-10-CM | POA: Diagnosis not present

## 2016-04-14 NOTE — Assessment & Plan Note (Signed)
Body mass index is 58.3 kg/m.  No results found for: TSH   Contributing to gerd tendency/ doe/reviewed the need and the process to achieve and maintain neg calorie balance > defer f/u primary care including intermittently monitoring thyroid status    I had an extended discussion with the patient reviewing all relevant studies completed to date and  lasting 15 to 20 minutes of a 25 minute visit    Calorie balance issues reviewed in the context of impacts of severe obesity on respiratory health and need for regular exercise to monitor for recurrent PE/ TEPAH   Each maintenance medication was reviewed in detail including most importantly the difference between maintenance and prns and under what circumstances the prns are to be triggered using an action plan format that is not reflected in the computer generated alphabetically organized AVS.    Please see instructions for details which were reviewed in writing and the patient given a copy highlighting the part that I personally wrote and discussed at today's ov.

## 2016-04-14 NOTE — Progress Notes (Signed)
Subjective:    Patient ID: Richard Sandoval, male    DOB: 18-May-1946  MRN: [id number]    Brief patient profile:  [id number]  yobm never smoker with  Severe obesity  complicated by OSA on cpap x around 1995 referred to pulmonary clinic 09/27/2013 by Dr [name] for eval of new cough since around Christmas 2014   History of Present Illness  09/27/2013 1st [facility] office visit/ [name] cc new onset cough like a gagging sensation x 2 months comes and goes but happens every day worse when in recliner after supper never brings up anything. Sob is baseline unless coughing.  No better p zyrtec. Onset was insidious, pattern is persistent. Rec Pepcid ac 20 mg after supper along with chlortrimeton 4 mg one after supper  GERD diet.     12/14/2013 f/u ov/Jobie Popp re:  Doe x long corridor slow pace x 2-3 years,  Cough sev years but comes and goes and no longer present p supper in recliner resolved on above rx but always present with temp change x years and almost constant urge to clear throat but daytime only for decades, not using zyrtec as rec, uses lots of mints and cough drops instead. rec The best treatment for your condition sugarless candy If the throat continues to bother you : Try prilosec 20mg   Take 30-60 min before first meal of the day and Pepcid 20 mg one bedtime with  zyrtrec  X 2 weeks and if not better continue the medication as is but [name] at [phone] for asthma challenge test > never done    Admit date: 02/20/2016  Discharge date: 02/24/2016 Principal Problem:   Bilateral pulmonary embolism (HCC) with mod PA pressure elevation  Active Problems:   OSA (obstructive sleep apnea)   Morbid obesity (HCC)   GERD (gastroesophageal reflux disease)     03/03/2016  f/u ov/[name] re: chronic cough  morbid obesity/ s/p pe on eliquis / asa 81 mg daily  Chief Complaint  Patient presents with  . Hospitalization Follow-up    Recently hospitalized with PE. His breathing has improved almost back baseline. He  has occ cough with grey sputum.    Does ok leaning on cart [employer] on 2lpm  Sleeping on cpap /2lpm per Dr [name] Cough continues day > noct assoc with urge to clear throat rec If you are concerned about the cough  Try prilosec 20mg   Take 30-60 min before first meal of the day and Pepcid 20 mg one bedtime with  zyrtrec  X 2 weeks and if not better continue the medication as is but [name] at [phone] for asthma challenge test  For now use 02 2lpm with cpap and as needed during the day       04/14/2016  f/u ov/[name] re:  PE/ severe obesity / cough resolved  Chief Complaint  Patient presents with  . Follow-up    Breathing is doing well overall. No new co's today.   02 2lpm in cpap per Dr [name]  No longer using portable 02 and walking is gradually improving    No obvious  day to day or daytime variabilty or assoc cough or cp or chest tightness, subjective wheeze overt sinus or hb symptoms. No unusual exp hx or h/o childhood pna/ asthma or knowledge of premature birth.  Sleeping ok without nocturnal  or early am exacerbation  of respiratory  c/o's or need for noct saba. Also denies any obvious fluctuation of symptoms with weather or environmental  changes or other aggravating or alleviating factors except as outlined above   Current Medications, Allergies, Complete Past Medical History, Past Surgical History, Family History, and Social History were reviewed in [employer] record.  ROS  The following are not active complaints unless bolded sore throat, dysphagia, dental problems, itching, sneezing,  nasal congestion or excess/ purulent secretions, ear ache,   fever, chills, sweats, unintended wt loss, pleuritic or exertional cp, hemoptysis,  orthopnea pnd or leg swelling L >> R x years   presyncope, palpitations, heartburn, abdominal pain, anorexia, nausea, vomiting, diarrhea  or change in bowel or urinary habits, change in stools or urine, dysuria,hematuria,  rash,  arthralgias, visual complaints, headache, numbness weakness or ataxia or problems with walking or coordination,  change in mood/affect or memory.                   Objective:   Physical Exam  04/14/2016        418  03/03/2016          422  12/14/2013        443     09/27/13 440 lb (199.583 kg)  08/04/12 435 lb (197.315 kg)  08/04/12 435 lb (197.315 kg)    Pleasant massively obese  bm nad with freq throat clearing    HEENT: nl dentition, turbinates, and orophanx which is pristine. Nl external ear canals without cough reflex   NECK :  without JVD/Nodes/TM/ nl carotid upstrokes bilaterally   LUNGS: no acc muscle use, clear to A and P bilaterally without cough on insp or exp maneuvers   CV:  RRR  no s3 or murmur or increase in P2,  L leg 2 + pitting bilateral lower ext edema / severe chronic venous stasis changes, min on R   ABD:  soft and nontender with nl excursion in the supine position. No bruits or organomegaly, bowel sounds nl  MS:  warm without deformities, calf tenderness, cyanosis or clubbing  SKIN: warm and dry with severe chronic venous changes both legs   NEURO:  alert, approp, no deficits       I personally reviewed images and agree with radiology impression as follows:  CTa Chest   02/20/16 Positive for acute bilateral PE with CT evidence of right heart strain (RV/LV Ratio = 1.1) consistent with at least submassive (intermediate risk) PE.          Assessment & Plan:

## 2016-04-14 NOTE — Assessment & Plan Note (Addendum)
CTa Chest   02/20/16 Positive for acute bilateral PE with CT evidence of right heart strain (RV/LV Ratio = 1.1) consistent with at least submassive (intermediate risk) PE.    - Echo 02/21/16  Left ventricle: The cavity size was normal. There was moderate   concentric hypertrophy. Systolic function was normal. The   estimated ejection fraction was in the range of 50% to 55%.   Challenging to estimate, even with Definity. Wall motion was   normal; there were no regional wall motion abnormalities. Doppler   parameters are consistent with abnormal left ventricular   relaxation (grade 1 diastolic dysfunction). - Ventricular septum: The contour showed diastolic flattening. - Left atrium: The atrium was moderately dilated. - Right ventricle: The cavity size was moderately dilated. Wall   thickness was normal. Systolic function was moderately reduced. - Right atrium: The atrium was severely dilated. - Pulmonary arteries: PA peak pressure: 52 mm Hg (S).   Clearly improving no longer limited by breathing or needing daytime 02 so no need for further imaging studies to r/o TEPAH > only if worse sob:  V/q and repeat echo and pulmonar f/u prn

## 2016-04-14 NOTE — Patient Instructions (Addendum)
Pulmonary follow up is as needed if you feel are losing ground with your exercise tolerance.  Weight control is simply a matter of calorie balance which needs to be tilted in your favor by eating less and exercising more.  To get the most out of exercise, you need to be continuously aware that you are short of breath, but never out of breath, for 30 minutes daily. As you improve, it will actually be easier for you to do the same amount of exercise  in  30 minutes so always push to the level where you are short of breath.  If this does not result in gradual weight reduction then I strongly recommend you see a nutritionist with a food diary x 2 weeks so that we can work out a negative calorie balance which is universally effective in steady weight loss programs.  Think of your calorie balance like you do your bank account where in this case you want the balance to go down so you must take in less calories than you burn up.  It's just that simple:  Hard to do, but easy to understand.  Good luck!

## 2016-12-24 DIAGNOSIS — G4733 Obstructive sleep apnea (adult) (pediatric): Secondary | ICD-10-CM | POA: Diagnosis not present

## 2017-02-09 DIAGNOSIS — K219 Gastro-esophageal reflux disease without esophagitis: Secondary | ICD-10-CM | POA: Diagnosis not present

## 2017-02-09 DIAGNOSIS — F322 Major depressive disorder, single episode, severe without psychotic features: Secondary | ICD-10-CM | POA: Diagnosis not present

## 2017-02-09 DIAGNOSIS — E785 Hyperlipidemia, unspecified: Secondary | ICD-10-CM | POA: Diagnosis not present

## 2017-02-09 DIAGNOSIS — Z125 Encounter for screening for malignant neoplasm of prostate: Secondary | ICD-10-CM | POA: Diagnosis not present

## 2017-02-09 DIAGNOSIS — Z86711 Personal history of pulmonary embolism: Secondary | ICD-10-CM | POA: Diagnosis not present

## 2017-02-09 DIAGNOSIS — Z7901 Long term (current) use of anticoagulants: Secondary | ICD-10-CM | POA: Diagnosis not present

## 2017-05-11 ENCOUNTER — Telehealth: Payer: Self-pay

## 2017-05-11 DIAGNOSIS — Z7901 Long term (current) use of anticoagulants: Secondary | ICD-10-CM | POA: Diagnosis not present

## 2017-05-11 DIAGNOSIS — Z86711 Personal history of pulmonary embolism: Secondary | ICD-10-CM | POA: Diagnosis not present

## 2017-05-11 DIAGNOSIS — F324 Major depressive disorder, single episode, in partial remission: Secondary | ICD-10-CM | POA: Diagnosis not present

## 2017-05-11 DIAGNOSIS — I5032 Chronic diastolic (congestive) heart failure: Secondary | ICD-10-CM | POA: Diagnosis not present

## 2017-05-11 DIAGNOSIS — M7989 Other specified soft tissue disorders: Secondary | ICD-10-CM | POA: Diagnosis not present

## 2017-05-11 DIAGNOSIS — Z23 Encounter for immunization: Secondary | ICD-10-CM | POA: Diagnosis not present

## 2017-05-11 DIAGNOSIS — I272 Pulmonary hypertension, unspecified: Secondary | ICD-10-CM | POA: Diagnosis not present

## 2017-05-11 NOTE — Telephone Encounter (Signed)
SENT NOTES TO SCHEDULING 

## 2017-05-18 ENCOUNTER — Telehealth: Payer: Self-pay | Admitting: Cardiovascular Disease

## 2017-05-18 NOTE — Telephone Encounter (Signed)
[facility] [place] [facility] [facility] Dr Duke Salviaandolph.  Records put with Dr Leonides Sakeandolph's schedule for 06/04/17. lp

## 2017-06-04 ENCOUNTER — Encounter: Payer: Self-pay | Admitting: Cardiovascular Disease

## 2017-06-04 ENCOUNTER — Ambulatory Visit (INDEPENDENT_AMBULATORY_CARE_PROVIDER_SITE_OTHER): Payer: Managed Care, Other (non HMO) | Admitting: Cardiovascular Disease

## 2017-06-04 VITALS — BP 112/64 | HR 68 | Ht 70.0 in | Wt >= 6400 oz

## 2017-06-04 DIAGNOSIS — R0602 Shortness of breath: Secondary | ICD-10-CM | POA: Diagnosis not present

## 2017-06-04 DIAGNOSIS — I272 Pulmonary hypertension, unspecified: Secondary | ICD-10-CM | POA: Insufficient documentation

## 2017-06-04 DIAGNOSIS — Z5181 Encounter for therapeutic drug level monitoring: Secondary | ICD-10-CM | POA: Diagnosis not present

## 2017-06-04 HISTORY — DX: Pulmonary hypertension, unspecified: I27.20

## 2017-06-04 MED ORDER — PANTOPRAZOLE SODIUM 40 MG PO TBEC: 40.0000 mg | DELAYED_RELEASE_TABLET | Freq: Every day | ORAL | 11 refills | Status: DC

## 2017-06-04 MED ORDER — FUROSEMIDE 20 MG PO TABS: 20.0000 mg | ORAL_TABLET | Freq: Every day | ORAL | 5 refills | Status: DC

## 2017-06-04 NOTE — Progress Notes (Signed)
Cardiology Office Note   Date:  06/04/2017   ID:  Richard Sandoval, DOB 17-Jun-1946, MRN [id number]  PCP:  Laurann MontanaWhite, Cynthia, MD  Cardiologist:   Chilton Siiffany [place], MD   No chief complaint on file.     History of Present Illness: Richard HawkLarry Hurwitz is a 71 y.o. male with prior pulmonary embolism, moderate pulmonary hypertension, hypertension, morbid obesity, OSA on CPAP who is being seen today for the evaluation of chronic dyspnea on exertion at the request of Laurann MontanaWhite, Cynthia, MD.  Mr. Richard Sandoval reports many years of shortness of breath.  This dates back to 2000 after he had surgery on his back.  Since that time he continues to have pain and has not been able to be very physically active.  He has noted progressive dyspnea on exertion.  In July 2017 he had a left lower extremity DVT and pulmonary embolism.  Mr. Richard Sandoval had an echo 02/21/16 that revealed LVEF 50-55% with grade 1 diastolic dysfunction.  PASP was 52 mmHg.  He thinks that the shortness of breath has gotten worse since that time.  He still has lower extremity edema that is worse in the left than the right.  This started after developing cellulitis in that leg.  He has OSA and uses CPAP regularly for the last 25 years.  He never smoked but has been exposed to secondhand smoke   Mr. Richard Sandoval tries to be active and likes to do his yard work.  He has to stop several times while mowing the lawn.  This is attributable to back pain and some shortness of breath.  He has noted some episodes of chest discomfort that he thinks or indigestion.  He notes it after eating certain foods but also sometimes while at rest or with exertion.  There is no clear precipitant.  The episodes last for 5 or 6 minutes and are not associated with shortness of breath or nausea.  It typically improves with belching.    Past Medical History:  Diagnosis Date  . DVT (deep venous thrombosis) (HCC)    L leg in 2009  . Hypertension    enlarged heart  . Morbid obesity (HCC)   . OSA  (obstructive sleep apnea)    on CPAP since 1995  . Pancreatitis   . Pulmonary hypertension (HCC) 06/04/2017    Past Surgical History:  Procedure Laterality Date  . BACK SURGERY  04/09/1999  . cervical laminectomy and fusion    . KNEE SURGERY  1998  . left inguinal hernia    . LUMBAR LAMINECTOMY       Current Outpatient Medications  Medication Sig Dispense Refill  . apixaban (ELIQUIS) 5 MG TABS tablet PO - Take 10 mg twice a day for one 5 days then switch to 5 mg twice a day, provide one-month supply dispense as needed. Thereafter PCP to provide more refills. 60 tablet 0  . aspirin EC 81 MG tablet Take 81 mg by mouth daily.    . OXYGEN 2lpm  with CPAP  [facility]    . furosemide (LASIX) 20 MG tablet Take 1 tablet (20 mg total) daily by mouth. 30 tablet 5  . pantoprazole (PROTONIX) 40 MG tablet Take 1 tablet (40 mg total) daily by mouth. 30 tablet 11   No current facility-administered medications for this visit.     Allergies:   Penicillins    Social History:  The patient  reports that  has never smoked. he has never used smokeless tobacco. He reports that  he does not drink alcohol or use drugs.   Family History:  The patient's family history includes Breast cancer in his mother; Diabetes in his sister; Gout in his father; Heart disease in his brother; Hypertension in his father.    ROS:  Please see the history of present illness.   Otherwise, review of systems are positive for none.   All other systems are reviewed and negative.    PHYSICAL EXAM: VS:  BP 112/64   Pulse 68   Ht 5\' 10"  (1.778 m)   Wt (!) 187.1 kg (412 lb 6.4 oz)   SpO2 99%   BMI 59.17 kg/m  , BMI Body mass index is 59.17 kg/m. GENERAL:  Well appearing HEENT:  Pupils equal round and reactive, fundi not visualized, oral mucosa unremarkable NECK:  JVP 2cm above clavicel at 45 degrees.  +HJR.  waveform within normal limits, carotid upstroke brisk and symmetric, no bruits, no thyromegaly LYMPHATICS:  No cervical  adenopathy LUNGS:  Clear to auscultation bilaterally HEART:  RRR.  PMI not displaced or sustained,S1 and S2 within normal limits, no S3, no S4, no clicks, no rubs, no murmurs ABD:  Flat, positive bowel sounds normal in frequency in pitch, no bruits, no rebound, no guarding, no midline pulsatile mass, no hepatomegaly, no splenomegaly EXT:  2 plus pulses throughout, 2+ LLE edema, 1+ RLE edema.  no cyanosis no clubbing SKIN:  No rashes no nodules NEURO:  Cranial nerves II through XII grossly intact, motor grossly intact throughout PSYCH:  Cognitively intact, oriented to person place and time    EKG:  EKG is ordered today. The ekg ordered today demonstrates sinus rhythm.  Rate 68 bpm.  PACs.  LAFB.     Recent Labs: No results found for requested labs within last 8760 hours.   02/09/17: WBC 5.1, hemoglobin 15, hematocrit 45.1, platelets 191 Sodium 139, potassium 4.5, BUN 18, creatinine 1.0 AST 23, ALT 15 Total cholesterol 165, triglycerides 47, HDL 37, LDL 119 next TSH 1.77  Lipid Panel    Component Value Date/Time   TRIG 40 01/05/2014 1759      Wt Readings from Last 3 Encounters:  06/04/17 (!) 187.1 kg (412 lb 6.4 oz)  04/14/16 (!) 189.6 kg (418 lb)  03/03/16 (!) 191.4 kg (422 lb)      ASSESSMENT AND PLAN:  # Chest discomfort:  Symptoms are atypical and seem more consistent with GERD and ischemia.  They have been unchanged for many years.  Give him a trial of pantoprazole 40 mg daily.  Given his body habitus only way to truly evaluate his coronary arteries would be a cardiac catheterization which does not seem indicated at this time.  # Pulmonary hypertension: Pulmonary pressures were moderately elevated last year in the setting of having a pulmonary embolism.  We will repeat his echocardiogram to see if they have improved.  He is mildly volume overloaded today.  Start Lasix 20 mg daily.  Check a basic metabolic panel in 1 week.  I suspect that his dyspnea on exertion is  multifactorial volume overload, morbid obesity, and deconditioning.  Current medicines are reviewed at length with the patient today.  The patient does not have concerns regarding medicines.  The following changes have been made:  Start protonix and furosemide  Labs/ tests ordered today include:   Orders Placed This Encounter  Procedures  . Basic metabolic panel  . EKG 12-Lead  . ECHOCARDIOGRAM COMPLETE     Disposition:   FU with Shahan Starks C.  Duke Salviaandolph, MD, [facility] in 2 months.     This note was written with the assistance of speech recognition software.  Please excuse any transcriptional errors.  Signed, Praneel Haisley C. Duke Salviaandolph, MD, [facility]  06/04/2017 12:58 PM    [facility]

## 2017-06-04 NOTE — Patient Instructions (Signed)
Medication Instructions:  START PROTONIX 40 MG DAILY  START FUROSEMIDE (LASIX) 20 MG DAILY   Labwork: BMET IN 1 WEEK  Testing/Procedures: Your physician has requested that you have an echocardiogram. Echocardiography is a painless test that uses sound waves to create images of your heart. It provides your doctor with information about the size and shape of your heart and how well your heart's chambers and valves are working. This procedure takes approximately one hour. There are no restrictions for this procedure. [facility] AT [address]  Follow-Up: Your physician recommends that you schedule a follow-up appointment in: 2 MONTH OV  Echocardiogram An echocardiogram, or echocardiography, uses sound waves (ultrasound) to produce an image of your heart. The echocardiogram is simple, painless, obtained within a short period of time, and offers valuable information to your health care provider. The images from an echocardiogram can provide information such as:  Evidence of coronary artery disease (CAD).  Heart size.  Heart muscle function.  Heart valve function.  Aneurysm detection.  Evidence of a past heart attack.  Fluid buildup around the heart.  Heart muscle thickening.  Assess heart valve function.  Tell a health care provider about:  Any allergies you have.  All medicines you are taking, including vitamins, herbs, eye drops, creams, and over-the-counter medicines.  Any problems you or family members have had with anesthetic medicines.  Any blood disorders you have.  Any surgeries you have had.  Any medical conditions you have.  Whether you are pregnant or may be pregnant. What happens before the procedure? No special preparation is needed. Eat and drink normally. What happens during the procedure?  In order to produce an image of your heart, gel will be applied to your chest and a wand-like tool (transducer) will be moved over your chest. The  gel will help transmit the sound waves from the transducer. The sound waves will harmlessly bounce off your heart to allow the heart images to be captured in real-time motion. These images will then be recorded.  You may need an IV to receive a medicine that improves the quality of the pictures. What happens after the procedure? You may return to your normal schedule including diet, activities, and medicines, unless your health care provider tells you otherwise. This information is not intended to replace advice given to you by your health care provider. Make sure you discuss any questions you have with your health care provider. Document Released: 07/11/2000 Document Revised: 03/01/2016 Document Reviewed: 03/21/2013 Elsevier Interactive Patient Education  2017 [employer]

## 2017-06-08 ENCOUNTER — Other Ambulatory Visit: Payer: Self-pay

## 2017-06-08 ENCOUNTER — Ambulatory Visit (HOSPITAL_COMMUNITY): Payer: Managed Care, Other (non HMO) | Attending: Cardiology

## 2017-06-08 DIAGNOSIS — G4733 Obstructive sleep apnea (adult) (pediatric): Secondary | ICD-10-CM | POA: Diagnosis not present

## 2017-06-08 DIAGNOSIS — I071 Rheumatic tricuspid insufficiency: Secondary | ICD-10-CM | POA: Insufficient documentation

## 2017-06-08 DIAGNOSIS — E669 Obesity, unspecified: Secondary | ICD-10-CM | POA: Diagnosis not present

## 2017-06-08 DIAGNOSIS — R0602 Shortness of breath: Secondary | ICD-10-CM | POA: Diagnosis not present

## 2017-06-08 DIAGNOSIS — I272 Pulmonary hypertension, unspecified: Secondary | ICD-10-CM | POA: Diagnosis not present

## 2017-06-08 DIAGNOSIS — Z86711 Personal history of pulmonary embolism: Secondary | ICD-10-CM | POA: Diagnosis not present

## 2017-06-08 MED ORDER — PERFLUTREN LIPID MICROSPHERE
1.0000 mL | INTRAVENOUS | Status: AC | PRN
  Administered 2017-06-08: 2 mL via INTRAVENOUS

## 2017-08-26 NOTE — Progress Notes (Signed)
Cardiology Office Note   Date:  08/27/2017   ID:  Richard Sandoval, DOB September 13, 1945, MRN [id number]  PCP:  Laurann MontanaWhite, Cynthia, MD  Cardiologist:   Chilton Siiffany Cascade, MD   No chief complaint on file.    History of Present Illness: Richard Sandoval is a 72 y.o. male with prior pulmonary embolism, moderate pulmonary hypertension, hypertension, morbid obesity, OSA on CPAP here for follow up.  He was initially seen 05/2017 for the evaluation of chronic dyspnea on exertion.  [name] reports many years of shortness of breath.  This dates back to 2000 after he had surgery on his back.  Since that time he continues to have pain and has not been able to be very physically active.  He has noted progressive dyspnea on exertion.  In July 2017 he had a left lower extremity DVT and pulmonary embolism.  [name] had an echo 02/21/16 that revealed LVEF 50-55% with grade 1 diastolic dysfunction.  PASP was 52 mmHg.  He thinks that the shortness of breath has gotten worse since that time.  He still has lower extremity edema that is worse in the left than the right.  This started after developing cellulitis in that leg.  He has OSA and uses CPAP regularly for the last 25 years.  He never smoked but has been exposed to secondhand smoke.  Since his last appointment [name] was referred for an echo 06/11/17 that revealed LVEF 55-60% with normal diastolic function.  Since his last appointment [name] has been feeling well.  He has not been exercising much due to the change in the weather.  In the spring and summer he does yard work but has not been doing this lately.  He is somewhat active, mostly in attending [employer] funerals.  Otherwise he has a very sedentary life.  He notes about a 15 pound weight gain since the holiday season.  He is not following any particular diet.  His breathing has been stable.  He gets dyspnea with exertion but not at rest.  He has no chest pain or pressure.  He has mild lower extremity edema that  has been better lately.  He stopped using Lasix regularly because it interfered with his sleep at night.  He does not take it during the daytime because he is unable to get to a bathroom during his daily activities.  He denies orthopnea or PND.  After his pulmonary embolism he was using oxygen during the daytime.  Lately he has not felt as though he needs this.  He continues to use his CPAP at night.  His only other complaint is cramping in his legs that occurs mostly at night.   Past Medical History:  Diagnosis Date  . DVT (deep venous thrombosis) (HCC)    L leg in 2009  . Hypertension    enlarged heart  . Morbid obesity (HCC)   . OSA (obstructive sleep apnea)    on CPAP since 1995  . Pancreatitis   . Pulmonary hypertension (HCC) 06/04/2017  . Right heart failure (HCC) 08/27/2017    Past Surgical History:  Procedure Laterality Date  . BACK SURGERY  04/09/1999  . cervical laminectomy and fusion    . CHOLECYSTECTOMY N/A 01/25/2014   Procedure: LAPAROSCOPIC CHOLECYSTECTOMY WITH INTRAOPERATIVE CHOLANGIOGRAM;  Surgeon: [name] M Gerkin, MD;  Location: [facility];  Service: General;  Laterality: N/A;  . EUS N/A 01/24/2014   Procedure: UPPER ENDOSCOPIC ULTRASOUND (EUS) RADIAL;  Surgeon: Willis ModenaWilliam [name], MD;  Location:  [facility];  Service: Endoscopy;  Laterality: N/A;  . KNEE SURGERY  1998  . left inguinal hernia    . LUMBAR LAMINECTOMY    . TRIGGER FINGER RELEASE  08/04/2012   Procedure: MINOR RELEASE TRIGGER FINGER/A-1 PULLEY;  Surgeon: Nicki Reaper, MD;  Location: [facility];  Service: Orthopedics;  Laterality: Right;     Current Outpatient Medications  Medication Sig Dispense Refill  . apixaban (ELIQUIS) 5 MG TABS tablet Take 5 mg by mouth 2 (two) times daily.    [name] aspirin EC 81 MG tablet Take 81 mg by mouth daily.    . furosemide (LASIX) 20 MG tablet Take 20 mg by mouth as needed (SWELLING).    . OXYGEN 2lpm  with CPAP  [facility]    . pantoprazole (PROTONIX) 40 MG tablet Take 1  tablet (40 mg total) daily by mouth. 30 tablet 11   No current facility-administered medications for this visit.     Allergies:   Penicillins    Social History:  The patient  reports that  has never smoked. he has never used smokeless tobacco. He reports that he does not drink alcohol or use drugs.   Family History:  The patient's family history includes Breast cancer in his mother; Diabetes in his sister; Gout in his father; Heart disease in his brother; Hypertension in his father.    ROS:  Please see the history of present illness.   Otherwise, review of systems are positive for none.   All other systems are reviewed and negative.    PHYSICAL EXAM: VS:  BP 132/82   Pulse 60   Ht 5' 10.5" (1.791 m)   Wt (!) 420 lb 9.6 oz (190.8 kg)   BMI 59.50 kg/m  , BMI Body mass index is 59.5 kg/m. GENERAL:  Well appearing.  Morbidly obese.  HEENT: Pupils equal round and reactive, fundi not visualized, oral mucosa unremarkable NECK:  No jugular venous distention, waveform within normal limits, carotid upstroke brisk and symmetric, no bruits, no thyromegaly LUNGS:  Clear to auscultation bilaterally HEART:  RRR.  PMI not displaced or sustained,S1 and S2 within normal limits, no S3, no S4, no clicks, no rubs, no distant heart sounds.  Murmurs ABD:  Flat, positive bowel sounds normal in frequency in pitch, no bruits, no rebound, no guarding, no midline pulsatile mass, no hepatomegaly, no splenomegaly EXT:  2 plus pulses throughout, no edema, no cyanosis no clubbing SKIN:  No rashes no nodules NEURO:  Cranial nerves II through XII grossly intact, motor grossly intact throughout PSYCH:  Cognitively intact, oriented to person place and time   EKG:  EKG is ordered today. The ekg ordered today demonstrates sinus rhythm.  Rate 68 bpm.  PACs.  LAFB.     Recent Labs: No results found for requested labs within last 8760 hours.   02/09/17: WBC 5.1, hemoglobin 15, hematocrit 45.1, platelets  191 Sodium 139, potassium 4.5, BUN 18, creatinine 1.0 AST 23, ALT 15 Total cholesterol 165, triglycerides 47, HDL 37, LDL 119 next TSH 1.77  Lipid Panel    Component Value Date/Time   TRIG 40 01/05/2014 1759      Wt Readings from Last 3 Encounters:  08/27/17 (!) 420 lb 9.6 oz (190.8 kg)  06/04/17 (!) 412 lb 6.4 oz (187.1 kg)  04/14/16 (!) 418 lb (189.6 kg)      ASSESSMENT AND PLAN:  # Chest discomfort:  Resolved.  # Shortness of breath: # Morbid obesity:  Mr. Elvis Coil  shortness of breath has improved but is still present with exertion.  Pulmonary pressures were better on his repeat echo.  I suspect that this is mostly due to morbid obesity and physical inactivity.  We discussed the importance of exercising at least 150 minutes weekly.  He expressed understanding and we will try to enroll him in a care guide program to assist him with these changes.   # Pulmonary hypertension: Pulmonary pressures were moderately elevated last year in the setting of having a pulmonary embolism.  This improved on repeat but he has some RV failure.  This may lead to underestimating his pulmonary pressure.  Symptoms have improved and he is using lasix as needed.  # Leg cramping: Check BMP and magnesium.  Current medicines are reviewed at length with the patient today.  The patient does not have concerns regarding medicines.  The following changes have been made:  Start protonix and furosemide  Labs/ tests ordered today include:   Orders Placed This Encounter  Procedures  . Magnesium  . Basic metabolic panel     Disposition:   FU with Ariyan Brisendine C. Duke Salvia, MD, [facility] in 6 months.     This note was written with the assistance of speech recognition software.  Please excuse any transcriptional errors.  Signed, Luiz Trumpower C. Duke Salvia, MD, [facility]  08/27/2017 9:54 AM    [facility]

## 2017-08-27 ENCOUNTER — Encounter: Payer: Self-pay | Admitting: Cardiovascular Disease

## 2017-08-27 ENCOUNTER — Ambulatory Visit (INDEPENDENT_AMBULATORY_CARE_PROVIDER_SITE_OTHER): Payer: 59 | Admitting: Cardiovascular Disease

## 2017-08-27 VITALS — BP 132/82 | HR 60 | Ht 70.5 in | Wt >= 6400 oz

## 2017-08-27 DIAGNOSIS — R252 Cramp and spasm: Secondary | ICD-10-CM | POA: Diagnosis not present

## 2017-08-27 DIAGNOSIS — R0602 Shortness of breath: Secondary | ICD-10-CM

## 2017-08-27 DIAGNOSIS — I50810 Right heart failure, unspecified: Secondary | ICD-10-CM | POA: Insufficient documentation

## 2017-08-27 DIAGNOSIS — I50812 Chronic right heart failure: Secondary | ICD-10-CM | POA: Diagnosis not present

## 2017-08-27 DIAGNOSIS — I272 Pulmonary hypertension, unspecified: Secondary | ICD-10-CM

## 2017-08-27 HISTORY — DX: Right heart failure, unspecified: I50.810

## 2017-08-27 LAB — BASIC METABOLIC PANEL
BUN / CREAT RATIO: 16 (ref 10–24)
BUN: 16 mg/dL (ref 8–27)
CHLORIDE: 101 mmol/L (ref 96–106)
CO2: 23 mmol/L (ref 20–29)
Calcium: 9.2 mg/dL (ref 8.6–10.2)
Creatinine, Ser: 1.03 mg/dL (ref 0.76–1.27)
GFR calc non Af Amer: 73 mL/min/{1.73_m2} (ref >59)
GFR, EST AFRICAN AMERICAN: 85 mL/min/{1.73_m2} (ref >59)
Glucose: 104 mg/dL — ABNORMAL HIGH (ref 65–99)
Potassium: 4.7 mmol/L (ref 3.5–5.2)
SODIUM: 139 mmol/L (ref 134–144)

## 2017-08-27 LAB — MAGNESIUM: MAGNESIUM: 2 mg/dL (ref 1.6–2.3)

## 2017-08-27 NOTE — Patient Instructions (Signed)
Medication Instructions:  USE FUROSEMIDE AS NEEDED FOR SWELLING IN YOUR ANKLES OR FEET   Labwork: BMET/MAGNESIUM TODAY   Testing/Procedures: NONE  Follow-Up: Your physician wants you to follow-up in: 6 MONTH OV You will receive a reminder letter in the mail two months in advance. If you don't receive a letter, please call our office to schedule the follow-up appointment.  Any Other Special Instructions Will Be Listed Below (If Applicable). TRY TO EXERCISE AT LEAST 150 MINUTES A WEEK   If you need a refill on your cardiac medications before your next appointment, please call your pharmacy.

## 2017-08-31 DIAGNOSIS — M5136 Other intervertebral disc degeneration, lumbar region: Secondary | ICD-10-CM | POA: Diagnosis not present

## 2017-08-31 DIAGNOSIS — R29898 Other symptoms and signs involving the musculoskeletal system: Secondary | ICD-10-CM | POA: Diagnosis not present

## 2017-08-31 DIAGNOSIS — F322 Major depressive disorder, single episode, severe without psychotic features: Secondary | ICD-10-CM | POA: Diagnosis not present

## 2017-09-02 ENCOUNTER — Other Ambulatory Visit: Payer: Self-pay | Admitting: Family Medicine

## 2017-09-02 DIAGNOSIS — M5136 Other intervertebral disc degeneration, lumbar region: Secondary | ICD-10-CM

## 2017-09-12 ENCOUNTER — Ambulatory Visit
Admission: RE | Admit: 2017-09-12 | Discharge: 2017-09-12 | Disposition: A | Discharge status: Home / Self Care | Payer: Medicare Other | Source: Ambulatory Visit | Attending: Family Medicine | Admitting: Family Medicine

## 2017-09-12 DIAGNOSIS — M48061 Spinal stenosis, lumbar region without neurogenic claudication: Secondary | ICD-10-CM | POA: Diagnosis not present

## 2017-09-12 DIAGNOSIS — M5136 Other intervertebral disc degeneration, lumbar region: Secondary | ICD-10-CM

## 2017-09-12 MED ORDER — GADOBENATE DIMEGLUMINE 529 MG/ML IV SOLN
20.0000 mL | Freq: Once | INTRAVENOUS | Status: AC | PRN
  Administered 2017-09-12: 20 mL via INTRAVENOUS

## 2017-10-02 ENCOUNTER — Telehealth: Payer: Self-pay | Admitting: Cardiovascular Disease

## 2017-10-02 NOTE — Telephone Encounter (Signed)
Reached out to patient to set up a time for initial care guide apt. Pt mentioned his is unsure on whether or not to continue as he is scheduled for back surgery the first week in April. Mentioned to pt that we will keep him in mind moving forward if he is interested after surgery.

## 2017-10-07 ENCOUNTER — Other Ambulatory Visit: Payer: Self-pay | Admitting: Neurological Surgery

## 2017-10-07 ENCOUNTER — Telehealth: Payer: Self-pay

## 2017-10-07 NOTE — Telephone Encounter (Signed)
   [facility] Pre-operative Risk Assessment    Request for surgical clearance:  1. What type of surgery is being performed? Posterior Lumbar Interbody Fusion   2. When is this surgery scheduled? 11/03/2017   3. What type of clearance is required (medical clearance vs. Pharmacy clearance to hold med vs. Both)? Both   4. Are there any medications that need to be held prior to surgery and how long? Eliquis Aspirin   5. Practice name and name of physician performing surgery? [facility]  Dr. Kristeen Miss  6. What is your office phone and fax number? [phone] Fax [phone]   7. Anesthesia type (None, local, MAC, general) ? Unknown   Richard Sandoval T 10/07/2017, 3:07 PM  _________________________________________________________________   (provider comments below)

## 2017-10-12 NOTE — Telephone Encounter (Signed)
Clinical pharmacist to review case regarding how long to hold eliquis. He is on eliquis for DVT (2009) and PE (2017). Preop APP to contact patient afterward.

## 2017-10-12 NOTE — Telephone Encounter (Signed)
On Eliquis for non-cardiac indication.    Noted history of "Bilateral submassive PE  with acute hypoxic respiratory failure. Patient with history of DVT in 2009."    Pulmonologist/PCP to clear anticoagulation prior to procedure.   Calypso Hagarty Rodriguez-Guzman PharmD, BCPS, CPP [facility] [address] [place] [phone] 10/12/2017 4:08 PM

## 2017-10-12 NOTE — Telephone Encounter (Signed)
Left the patient a phone message for him to call back between 1:30-5pm weekday and speak to on call preop APP  Signed, Azalee CourseHao Nasire Reali PA Pager: [phone]

## 2017-10-13 NOTE — Telephone Encounter (Signed)
   Primary Cardiologist: Chilton Siiffany Fluvanna, MD  PCP: Laurann MontanaWhite, Cynthia, MD   Chart reviewed as part of pre-operative protocol coverage.   He was contacted by phone and his respiratory status is at baseline per the patient.  He still has dyspnea if he does too much or over exerts himself, but he feels he is generally doing well.  Based on ACC/AHA guidelines, Myrtie HawkLarry Sadlowski would be at increased but acceptable risk for the planned procedure without further cardiovascular testing.   Regarding anticoagulation, Dr. Cliffton AstersWhite has been prescribing his Eliquis, which is for PE/DVT.[name].  She should be the physician to determine if it is okay for him to be off anticoagulation.    When I spoke to the patient, I made him aware of this.  He will contact Dr. Cliffton AstersWhite and route her recommendations to Dr. Danielle DessElsner.  I will route this recommendation to the requesting party via Epic fax function and remove from pre-op pool.  Please call with questions.  Theodore Demarkhonda Minah Axelrod, PA-C 10/13/2017, 2:33 PM

## 2017-10-14 ENCOUNTER — Telehealth: Payer: Self-pay | Admitting: Cardiovascular Disease

## 2017-10-14 NOTE — Telephone Encounter (Signed)
New message   Dr white wants to speak to DOD for Eliquis

## 2017-10-27 ENCOUNTER — Inpatient Hospital Stay (HOSPITAL_COMMUNITY): Admission: RE | Admit: 2017-10-27 | Payer: Medicare Other | Source: Ambulatory Visit

## 2017-11-23 ENCOUNTER — Telehealth: Payer: Self-pay | Admitting: *Deleted

## 2017-11-23 NOTE — Telephone Encounter (Signed)
lvm pt's surgical request was being sent to Dr. Danielle Dess and upcoming surgery is scheduled for May 13.

## 2017-11-23 NOTE — Telephone Encounter (Signed)
New  Message    Patient calling to update surgery date, also to confirm clearance is complete.

## 2017-11-23 NOTE — Telephone Encounter (Signed)
His clearance was already sent - please verify.   Rosalio Macadamia, RN, ANP-C [facility] [address] [place], [place]  [address] [phone]

## 2017-11-26 NOTE — Pre-Procedure Instructions (Signed)
Ravin Denardo  11/26/2017      [facility] - [place], [place] - [address] [place] [address] Phone: [phone] Fax: [phone]    Your procedure is scheduled on May 13.  Report to [facility] Admitting at 730 A.M.  Call this number if you have problems the morning of surgery:  [phone]   Remember:  Do not eat food or drink liquids after midnight.  Take these medicines the morning of surgery with A SIP OF WATER Pantoprazole (Protonix)   Stop taking aspirin and Eliquis as directed by your Dr. Stop taking BC's, Goody's, Herbal medications, Fish Oil, Aleve, Vitamins, Ibuprofen, Advil, motrin   Do not wear jewelry, make-up or nail polish.  Do not wear lotions, powders, or perfumes, or deodorant.  Do not shave 48 hours prior to surgery.  Men may shave face and neck.  Do not bring valuables to the hospital.  [facility] is not responsible for any belongings or valuables.  Contacts, dentures or bridgework may not be worn into surgery.  Leave your suitcase in the car.  After surgery it may be brought to your room.  For patients admitted to the hospital, discharge time will be determined by your treatment team.  Patients discharged the day of surgery will not be allowed to drive home.    Special instructions:   [facility]- Preparing For Surgery  Before surgery, you can play an important role. Because skin is not sterile, your skin needs to be as free of germs as possible. You can reduce the number of germs on your skin by washing with CHG (chlorahexidine gluconate) Soap before surgery.  CHG is an antiseptic cleaner which kills germs and bonds with the skin to continue killing germs even after washing.  Please do not use if you have an allergy to CHG or antibacterial soaps. If your skin becomes reddened/irritated stop using the CHG.  Do not shave (including legs and underarms) for at least 48 hours prior to first CHG  shower. It is OK to shave your face.  Please follow these instructions carefully.   1. Shower the NIGHT BEFORE SURGERY and the MORNING OF SURGERY with CHG.   2. If you chose to wash your hair, wash your hair first as usual with your normal shampoo.  3. After you shampoo, rinse your hair and body thoroughly to remove the shampoo.  4. Use CHG as you would any other liquid soap. You can apply CHG directly to the skin and wash gently with a scrungie or a clean washcloth.   5. Apply the CHG Soap to your body ONLY FROM THE NECK DOWN.  Do not use on open wounds or open sores. Avoid contact with your eyes, ears, mouth and genitals (private parts). Wash Face and genitals (private parts)  with your normal soap.  6. Wash thoroughly, paying special attention to the area where your surgery will be performed.  7. Thoroughly rinse your body with warm water from the neck down.  8. DO NOT shower/wash with your normal soap after using and rinsing off the CHG Soap.  9. Pat yourself dry with a CLEAN TOWEL.  10. Wear CLEAN PAJAMAS to bed the night before surgery, wear comfortable clothes the morning of surgery  11. Place CLEAN SHEETS on your bed the night of your first shower and DO NOT SLEEP WITH PETS.    Day of Surgery: Do not apply any deodorants/lotions. Please wear  clean clothes to the hospital/surgery center.      Please read over the following fact sheets that you were given. Pain Booklet, Coughing and Deep Breathing, MRSA Information and Surgical Site Infection Prevention

## 2017-11-27 ENCOUNTER — Encounter (HOSPITAL_COMMUNITY)
Admission: RE | Admit: 2017-11-27 | Discharge: 2017-11-27 | Disposition: A | Discharge status: Home / Self Care | Payer: 59 | Source: Ambulatory Visit | Attending: Neurological Surgery | Admitting: Neurological Surgery

## 2017-11-27 ENCOUNTER — Encounter (HOSPITAL_COMMUNITY): Payer: Self-pay

## 2017-11-27 ENCOUNTER — Other Ambulatory Visit: Payer: Self-pay

## 2017-11-27 DIAGNOSIS — Z0183 Encounter for blood typing: Secondary | ICD-10-CM | POA: Diagnosis not present

## 2017-11-27 DIAGNOSIS — Z86711 Personal history of pulmonary embolism: Secondary | ICD-10-CM | POA: Insufficient documentation

## 2017-11-27 DIAGNOSIS — Z01812 Encounter for preprocedural laboratory examination: Secondary | ICD-10-CM | POA: Insufficient documentation

## 2017-11-27 DIAGNOSIS — Z7901 Long term (current) use of anticoagulants: Secondary | ICD-10-CM | POA: Insufficient documentation

## 2017-11-27 DIAGNOSIS — Z981 Arthrodesis status: Secondary | ICD-10-CM | POA: Insufficient documentation

## 2017-11-27 DIAGNOSIS — Z01818 Encounter for other preprocedural examination: Secondary | ICD-10-CM | POA: Diagnosis not present

## 2017-11-27 DIAGNOSIS — I110 Hypertensive heart disease with heart failure: Secondary | ICD-10-CM | POA: Diagnosis not present

## 2017-11-27 DIAGNOSIS — M48062 Spinal stenosis, lumbar region with neurogenic claudication: Secondary | ICD-10-CM | POA: Diagnosis not present

## 2017-11-27 DIAGNOSIS — I50810 Right heart failure, unspecified: Secondary | ICD-10-CM | POA: Diagnosis not present

## 2017-11-27 DIAGNOSIS — Z9049 Acquired absence of other specified parts of digestive tract: Secondary | ICD-10-CM | POA: Diagnosis not present

## 2017-11-27 DIAGNOSIS — Z9981 Dependence on supplemental oxygen: Secondary | ICD-10-CM | POA: Diagnosis not present

## 2017-11-27 DIAGNOSIS — Z86718 Personal history of other venous thrombosis and embolism: Secondary | ICD-10-CM | POA: Diagnosis not present

## 2017-11-27 DIAGNOSIS — Z7982 Long term (current) use of aspirin: Secondary | ICD-10-CM | POA: Insufficient documentation

## 2017-11-27 DIAGNOSIS — G4733 Obstructive sleep apnea (adult) (pediatric): Secondary | ICD-10-CM | POA: Diagnosis not present

## 2017-11-27 DIAGNOSIS — Z6841 Body Mass Index (BMI) 40.0 and over, adult: Secondary | ICD-10-CM | POA: Diagnosis not present

## 2017-11-27 DIAGNOSIS — K219 Gastro-esophageal reflux disease without esophagitis: Secondary | ICD-10-CM | POA: Diagnosis not present

## 2017-11-27 DIAGNOSIS — Z79899 Other long term (current) drug therapy: Secondary | ICD-10-CM | POA: Diagnosis not present

## 2017-11-27 HISTORY — DX: Gastro-esophageal reflux disease without esophagitis: K21.9

## 2017-11-27 HISTORY — DX: Dyspnea, unspecified: R06.00

## 2017-11-27 LAB — TYPE AND SCREEN
ABO/RH(D): O POS
ANTIBODY SCREEN: NEGATIVE

## 2017-11-27 LAB — BASIC METABOLIC PANEL
ANION GAP: 10 (ref 5–15)
BUN: 16 mg/dL (ref 6–20)
CO2: 22 mmol/L (ref 22–32)
Calcium: 9 mg/dL (ref 8.9–10.3)
Chloride: 106 mmol/L (ref 101–111)
Creatinine, Ser: 1.02 mg/dL (ref 0.61–1.24)
GFR calc Af Amer: >60 mL/min (ref >60)
GFR calc non Af Amer: >60 mL/min (ref >60)
GLUCOSE: 113 mg/dL — AB (ref 65–99)
POTASSIUM: 4.1 mmol/L (ref 3.5–5.1)
Sodium: 138 mmol/L (ref 135–145)

## 2017-11-27 LAB — SURGICAL PCR SCREEN
MRSA, PCR: NEGATIVE
STAPHYLOCOCCUS AUREUS: NEGATIVE

## 2017-11-27 LAB — CBC
HEMATOCRIT: 45.3 % (ref 39.0–52.0)
HEMOGLOBIN: 14.6 g/dL (ref 13.0–17.0)
MCH: 29 pg (ref 26.0–34.0)
MCHC: 32.2 g/dL (ref 30.0–36.0)
MCV: 90.1 fL (ref 78.0–100.0)
Platelets: 167 10*3/uL (ref 150–400)
RBC: 5.03 MIL/uL (ref 4.22–5.81)
RDW: 13.9 % (ref 11.5–15.5)
WBC: 4.7 10*3/uL (ref 4.0–10.5)

## 2017-11-27 LAB — PROTIME-INR
INR: 1.16
Prothrombin Time: 14.7 seconds (ref 11.4–15.2)

## 2017-11-27 LAB — ABO/RH: ABO/RH(D): O POS

## 2017-11-27 NOTE — Progress Notes (Addendum)
PCP is Dr. Laurann Montana Cardiologist is Dr Maryann Alar Pulmonary is Dr Earl Gala - manages his CPAP Reports he wears o2 only at night with his CPAP States about 6-7 months ago he did wear O2 all day, but no longer has to. Denies chest pain and fever, but repots a cough due to the pollen at times States he was told to stop taking aspirin  And Eliquis 3 days prior to surgery. Called Dr Verlee Rossetti office ad requested clearance be faxed to [place]. Instructed pt to bring in his CPAP mask on the day of surgery.- voices understanding. No shortness of breath noted at this time.  Echo 06-08-17 EKG noted 06-04-17 in epic

## 2017-11-30 NOTE — Progress Notes (Signed)
Anesthesia Chart Review:   Case:  [id number] Date/Time:  12/07/17 0915   Procedure:  L2-3 Posterior lumbar interbody fusion (N/A Back) - L2-3 Posterior lumbar interbody fusion   Anesthesia type:  General   Pre-op diagnosis:  Lumbar stenosis with neurogenic claudication   [facility] ROOM 19 / [facility]   Surgeon:  Barnett Abu, MD      DISCUSSION: Patient is a 72 year old male scheduled for the above procedure. History includes never smoker, OSA (CPAP, 2L O2), HTN, LLE DVT '09 & 02/21/16, PE 02/21/16 (bilateral PE with right heart strain/submassive PE; required short-term home O2), pulmonary hypertension (PA peak pressure 52 mmHg 02/21/16 in the setting of acute PE and 16 mmHg 06/08/17), right heart failure, super morbid obesity, dyspnea, prior back surgery '00.   He has cardiology clearance for surgery. Reported that he was instructed to hold ASA and Eliquis 3 days prior to surgery (PCP is managing for DVT/PE history). I notified Erie Noe at Dr. Verlee Rossetti office. She will confirm/clarify those instructions with patient. He will need a PT/INR on the day of surgery.  Based on currently available information, I anticipate that he can proceed as planned. He does use CPAP with O2, but has been told to bring his mask with him on the day of surgery.    VS: BP (!) 156/71   Pulse 64   Temp 36.7 C   Resp 20   Ht 5' 10.5" (1.791 m)   Wt (!) 423 lb 9.6 oz (192.1 kg)   SpO2 95%   BMI 59.92 kg/m   PROVIDERS: Laurann Montana, MD is PCP - Chilton Si, MD is cardiologist. Last office visit 08/27/17. Per 10/13/17 notation by Theodore Demark, PA-C, "...Based on ACC/AHA guidelines, Kenry Daubert would be at increased but acceptable risk for the planned procedure without further cardiovascular testing." In regards to anticoagulation instructions, she advised that patient contact Dr. Cliffton Asters for recommendations and discuss with Dr. Danielle Dess. - OSA is managed by Theressa Millard, MD.   LABS: Labs reviewed:  Acceptable for surgery. Plan to repeat PT/INR on the day of surgery since he was still on Eliquis at the time of his PAT visit.  (all labs ordered are listed, but only abnormal results are displayed)  Labs Reviewed  BASIC METABOLIC PANEL - Abnormal; Notable for the following components:      Result Value   Glucose, Bld 113 (*)    All other components within normal limits  SURGICAL PCR SCREEN  CBC  PROTIME-INR  TYPE AND SCREEN  ABO/RH     IMAGES: MRI L-spine 09/12/17: IMPRESSION: - Severe spinal stenosis L2-3 due to vertebral spurring and marked posterior element hypertrophy. Moderate foraminal narrowing bilaterally. - PLIF L3-4 L4-5 without stenosis - Right laminectomy L5-S1. Left S1 nerve root impingement due to facet hypertrophy.  Last PFTs seen are from 11/2013 and showed minimal obstructive airways disease. Last chest imaging seen is from 02/20/16 CTA at the time of his PE diagnosis.   EKG: 06/14/17: SR, PACs, LAFB   CV: Echo 06/08/17: Study Conclusions - Left ventricle: The cavity size was normal. There was moderate   concentric hypertrophy. Systolic function was normal. The   estimated ejection fraction was in the range of 55% to 60%. Wall   motion was normal; there were no regional wall motion   abnormalities. Left ventricular diastolic function parameters   were normal. - Aortic valve: Trileaflet; normal thickness leaflets. There was no   regurgitation. - Right ventricle: The cavity  size was moderately dilated. Wall   thickness was normal. Systolic function was moderately reduced. - Tricuspid valve: There was mild regurgitation. - Pulmonary arteries: Systolic pressure was within the normal   range. - Inferior vena cava: The vessel was normal in size. The   respirophasic diameter changes were in the normal range (= 50%),   consistent with normal central venous pressure. - Pericardium, extracardiac: There was no pericardial effusion. Impressions: - When compared  to the prior study from 02/21/2016 RV is still   moderately dilated with moderately reduced systolic function,   however RVSP is now normal, previously moderately increased.  Past Medical History:  Diagnosis Date  . DVT (deep venous thrombosis) (HCC)    L leg in 2009  . Dyspnea   . GERD (gastroesophageal reflux disease)   . Hypertension    enlarged heart  . Morbid obesity (HCC)   . OSA (obstructive sleep apnea)    on CPAP since 1995  . Pancreatitis   . Pulmonary hypertension (HCC) 06/04/2017  . Right heart failure (HCC) 08/27/2017    Past Surgical History:  Procedure Laterality Date  . BACK SURGERY  04/09/1999  . cervical laminectomy and fusion    . CHOLECYSTECTOMY N/A 01/25/2014   Procedure: LAPAROSCOPIC CHOLECYSTECTOMY WITH INTRAOPERATIVE CHOLANGIOGRAM;  Surgeon: Velora Heckler, MD;  Location: [facility];  Service: General;  Laterality: N/A;  . COLONOSCOPY    . EUS N/A 01/24/2014   Procedure: UPPER ENDOSCOPIC ULTRASOUND (EUS) RADIAL;  Surgeon: Willis Modena, MD;  Location: [facility];  Service: Endoscopy;  Laterality: N/A;  . KNEE SURGERY  1998  . left inguinal hernia    . LUMBAR LAMINECTOMY    . TRIGGER FINGER RELEASE  08/04/2012   Procedure: MINOR RELEASE TRIGGER FINGER/A-1 PULLEY;  Surgeon: Nicki Reaper, MD;  Location: [facility];  Service: Orthopedics;  Laterality: Right;    MEDICATIONS: . simethicone (MYLICON) 125 MG chewable tablet  . acetaminophen (TYLENOL) 325 MG tablet  . apixaban (ELIQUIS) 5 MG TABS tablet  . aspirin EC 81 MG tablet  . furosemide (LASIX) 20 MG tablet  . OXYGEN  . pantoprazole (PROTONIX) 40 MG tablet   No current facility-administered medications for this encounter.     Velna Ochs [facility]/Anesthesiology Phone [phone] 11/30/2017 4:40 PM

## 2017-12-02 DIAGNOSIS — Z86711 Personal history of pulmonary embolism: Secondary | ICD-10-CM | POA: Diagnosis not present

## 2017-12-02 DIAGNOSIS — M5136 Other intervertebral disc degeneration, lumbar region: Secondary | ICD-10-CM | POA: Diagnosis not present

## 2017-12-02 DIAGNOSIS — L609 Nail disorder, unspecified: Secondary | ICD-10-CM | POA: Diagnosis not present

## 2017-12-02 DIAGNOSIS — Z7901 Long term (current) use of anticoagulants: Secondary | ICD-10-CM | POA: Diagnosis not present

## 2017-12-02 DIAGNOSIS — I878 Other specified disorders of veins: Secondary | ICD-10-CM | POA: Diagnosis not present

## 2017-12-04 MED ORDER — VANCOMYCIN HCL 10 G IV SOLR
1500.0000 mg | INTRAVENOUS | Status: AC
  Administered 2017-12-07: 1500 mg via INTRAVENOUS
  Filled 2017-12-04: qty 1500

## 2017-12-07 ENCOUNTER — Inpatient Hospital Stay (HOSPITAL_COMMUNITY): Payer: 59 | Admitting: Vascular Surgery

## 2017-12-07 ENCOUNTER — Inpatient Hospital Stay (HOSPITAL_COMMUNITY)
Admission: RE | Admit: 2017-12-07 | Discharge: 2017-12-11 | DRG: 454 | Disposition: A | Discharge status: Home / Self Care | Payer: 59 | Source: Ambulatory Visit | Attending: Neurological Surgery | Admitting: Neurological Surgery

## 2017-12-07 ENCOUNTER — Other Ambulatory Visit: Payer: Self-pay

## 2017-12-07 ENCOUNTER — Inpatient Hospital Stay (HOSPITAL_COMMUNITY)
Admission: RE | Disposition: A | Discharge status: Home / Self Care | Payer: Self-pay | Source: Ambulatory Visit | Attending: Neurological Surgery

## 2017-12-07 ENCOUNTER — Inpatient Hospital Stay (HOSPITAL_COMMUNITY): Payer: 59 | Admitting: Emergency Medicine

## 2017-12-07 ENCOUNTER — Inpatient Hospital Stay (HOSPITAL_COMMUNITY): Payer: 59

## 2017-12-07 ENCOUNTER — Encounter (HOSPITAL_COMMUNITY): Payer: Self-pay | Admitting: Anesthesiology

## 2017-12-07 DIAGNOSIS — M47816 Spondylosis without myelopathy or radiculopathy, lumbar region: Secondary | ICD-10-CM | POA: Diagnosis present

## 2017-12-07 DIAGNOSIS — M48062 Spinal stenosis, lumbar region with neurogenic claudication: Principal | ICD-10-CM | POA: Diagnosis present

## 2017-12-07 DIAGNOSIS — Z9981 Dependence on supplemental oxygen: Secondary | ICD-10-CM | POA: Diagnosis not present

## 2017-12-07 DIAGNOSIS — Z8249 Family history of ischemic heart disease and other diseases of the circulatory system: Secondary | ICD-10-CM | POA: Diagnosis not present

## 2017-12-07 DIAGNOSIS — I2729 Other secondary pulmonary hypertension: Secondary | ICD-10-CM | POA: Diagnosis present

## 2017-12-07 DIAGNOSIS — I50812 Chronic right heart failure: Secondary | ICD-10-CM | POA: Diagnosis present

## 2017-12-07 DIAGNOSIS — Z8269 Family history of other diseases of the musculoskeletal system and connective tissue: Secondary | ICD-10-CM | POA: Diagnosis not present

## 2017-12-07 DIAGNOSIS — Z833 Family history of diabetes mellitus: Secondary | ICD-10-CM

## 2017-12-07 DIAGNOSIS — I270 Primary pulmonary hypertension: Secondary | ICD-10-CM | POA: Diagnosis not present

## 2017-12-07 DIAGNOSIS — Z6841 Body Mass Index (BMI) 40.0 and over, adult: Secondary | ICD-10-CM

## 2017-12-07 DIAGNOSIS — Z86718 Personal history of other venous thrombosis and embolism: Secondary | ICD-10-CM

## 2017-12-07 DIAGNOSIS — Z880 Allergy status to penicillin: Secondary | ICD-10-CM | POA: Diagnosis not present

## 2017-12-07 DIAGNOSIS — Z7901 Long term (current) use of anticoagulants: Secondary | ICD-10-CM

## 2017-12-07 DIAGNOSIS — Z7982 Long term (current) use of aspirin: Secondary | ICD-10-CM

## 2017-12-07 DIAGNOSIS — Z86711 Personal history of pulmonary embolism: Secondary | ICD-10-CM | POA: Diagnosis not present

## 2017-12-07 DIAGNOSIS — M48061 Spinal stenosis, lumbar region without neurogenic claudication: Secondary | ICD-10-CM | POA: Diagnosis not present

## 2017-12-07 DIAGNOSIS — K219 Gastro-esophageal reflux disease without esophagitis: Secondary | ICD-10-CM | POA: Diagnosis present

## 2017-12-07 DIAGNOSIS — Z9049 Acquired absence of other specified parts of digestive tract: Secondary | ICD-10-CM | POA: Diagnosis not present

## 2017-12-07 DIAGNOSIS — Z419 Encounter for procedure for purposes other than remedying health state, unspecified: Secondary | ICD-10-CM

## 2017-12-07 DIAGNOSIS — M5416 Radiculopathy, lumbar region: Secondary | ICD-10-CM | POA: Diagnosis not present

## 2017-12-07 DIAGNOSIS — Z803 Family history of malignant neoplasm of breast: Secondary | ICD-10-CM

## 2017-12-07 DIAGNOSIS — G4733 Obstructive sleep apnea (adult) (pediatric): Secondary | ICD-10-CM | POA: Diagnosis present

## 2017-12-07 DIAGNOSIS — Z79899 Other long term (current) drug therapy: Secondary | ICD-10-CM

## 2017-12-07 DIAGNOSIS — I110 Hypertensive heart disease with heart failure: Secondary | ICD-10-CM | POA: Diagnosis present

## 2017-12-07 DIAGNOSIS — Z9989 Dependence on other enabling machines and devices: Secondary | ICD-10-CM

## 2017-12-07 DIAGNOSIS — Z981 Arthrodesis status: Secondary | ICD-10-CM | POA: Diagnosis not present

## 2017-12-07 DIAGNOSIS — M5440 Lumbago with sciatica, unspecified side: Secondary | ICD-10-CM

## 2017-12-07 DIAGNOSIS — M47896 Other spondylosis, lumbar region: Secondary | ICD-10-CM | POA: Diagnosis not present

## 2017-12-07 LAB — PROTIME-INR
INR: 0.97
PROTHROMBIN TIME: 12.8 s (ref 11.4–15.2)

## 2017-12-07 SURGERY — POSTERIOR LUMBAR FUSION 1 LEVEL
Anesthesia: General | Site: Back

## 2017-12-07 MED ORDER — PHENOL 1.4 % MT LIQD: 1.0000 | OROMUCOSAL | Status: DC | PRN

## 2017-12-07 MED ORDER — PROMETHAZINE HCL 25 MG/ML IJ SOLN: 6.2500 mg | INTRAMUSCULAR | Status: DC | PRN

## 2017-12-07 MED ORDER — SUCCINYLCHOLINE CHLORIDE 200 MG/10ML IV SOSY
PREFILLED_SYRINGE | INTRAVENOUS | Status: DC | PRN
  Administered 2017-12-07: 140 mg via INTRAVENOUS

## 2017-12-07 MED ORDER — DEXAMETHASONE SODIUM PHOSPHATE 10 MG/ML IJ SOLN
INTRAMUSCULAR | Status: DC | PRN
  Administered 2017-12-07: 10 mg via INTRAVENOUS

## 2017-12-07 MED ORDER — METHOCARBAMOL 500 MG PO TABS
500.0000 mg | ORAL_TABLET | Freq: Four times a day (QID) | ORAL | Status: DC | PRN
  Administered 2017-12-09 – 2017-12-11 (×5): 500 mg via ORAL
  Filled 2017-12-07 (×5): qty 1

## 2017-12-07 MED ORDER — MENTHOL 3 MG MT LOZG: 1.0000 | LOZENGE | OROMUCOSAL | Status: DC | PRN

## 2017-12-07 MED ORDER — FLEET ENEMA 7-19 GM/118ML RE ENEM: 1.0000 | ENEMA | Freq: Once | RECTAL | Status: DC | PRN

## 2017-12-07 MED ORDER — ONDANSETRON HCL 4 MG/2ML IJ SOLN
INTRAMUSCULAR | Status: DC | PRN
Start: 2017-12-07 — End: 2017-12-07
  Administered 2017-12-07: 4 mg via INTRAVENOUS

## 2017-12-07 MED ORDER — 0.9 % SODIUM CHLORIDE (POUR BTL) OPTIME
TOPICAL | Status: DC | PRN
  Administered 2017-12-07: 1000 mL

## 2017-12-07 MED ORDER — SODIUM CHLORIDE 0.9 % IV SOLN: 250.0000 mL | INTRAVENOUS | Status: DC

## 2017-12-07 MED ORDER — LACTATED RINGERS IV SOLN
INTRAVENOUS | Status: DC | PRN
  Administered 2017-12-07 (×3): via INTRAVENOUS

## 2017-12-07 MED ORDER — POLYETHYLENE GLYCOL 3350 17 G PO PACK
17.0000 g | PACK | Freq: Every day | ORAL | Status: DC | PRN
  Administered 2017-12-09: 17 g via ORAL
  Filled 2017-12-07: qty 1

## 2017-12-07 MED ORDER — PHENYLEPHRINE 40 MCG/ML (10ML) SYRINGE FOR IV PUSH (FOR BLOOD PRESSURE SUPPORT)
PREFILLED_SYRINGE | INTRAVENOUS | Status: AC
  Filled 2017-12-07: qty 10

## 2017-12-07 MED ORDER — ONDANSETRON HCL 4 MG/2ML IJ SOLN
4.0000 mg | Freq: Four times a day (QID) | INTRAMUSCULAR | Status: DC | PRN
  Administered 2017-12-07 – 2017-12-09 (×3): 4 mg via INTRAVENOUS
  Filled 2017-12-07 (×3): qty 2

## 2017-12-07 MED ORDER — PHENYLEPHRINE HCL 10 MG/ML IJ SOLN
INTRAVENOUS | Status: DC | PRN
  Administered 2017-12-07: 15 ug/min via INTRAVENOUS

## 2017-12-07 MED ORDER — LIDOCAINE-EPINEPHRINE 1 %-1:100000 IJ SOLN
INTRAMUSCULAR | Status: AC
  Filled 2017-12-07: qty 1

## 2017-12-07 MED ORDER — LACTATED RINGERS IV SOLN: INTRAVENOUS | Status: DC

## 2017-12-07 MED ORDER — ACETAMINOPHEN 325 MG PO TABS: 650.0000 mg | ORAL_TABLET | ORAL | Status: DC | PRN

## 2017-12-07 MED ORDER — MIDAZOLAM HCL 2 MG/2ML IJ SOLN
INTRAMUSCULAR | Status: DC | PRN
  Administered 2017-12-07: 2 mg via INTRAVENOUS

## 2017-12-07 MED ORDER — ONDANSETRON HCL 4 MG PO TABS: 4.0000 mg | ORAL_TABLET | Freq: Four times a day (QID) | ORAL | Status: DC | PRN

## 2017-12-07 MED ORDER — CHLORHEXIDINE GLUCONATE CLOTH 2 % EX PADS: 6.0000 | MEDICATED_PAD | Freq: Once | CUTANEOUS | Status: DC

## 2017-12-07 MED ORDER — ROCURONIUM BROMIDE 10 MG/ML (PF) SYRINGE
PREFILLED_SYRINGE | INTRAVENOUS | Status: DC | PRN
  Administered 2017-12-07 (×2): 20 mg via INTRAVENOUS
  Administered 2017-12-07: 60 mg via INTRAVENOUS
  Administered 2017-12-07 (×2): 20 mg via INTRAVENOUS

## 2017-12-07 MED ORDER — ACETAMINOPHEN 650 MG RE SUPP: 650.0000 mg | RECTAL | Status: DC | PRN

## 2017-12-07 MED ORDER — SENNA 8.6 MG PO TABS
1.0000 | ORAL_TABLET | Freq: Two times a day (BID) | ORAL | Status: DC
  Administered 2017-12-07 – 2017-12-11 (×8): 8.6 mg via ORAL
  Filled 2017-12-07 (×8): qty 1

## 2017-12-07 MED ORDER — LACTATED RINGERS IV SOLN
INTRAVENOUS | Status: DC
  Administered 2017-12-07: 08:00:00 via INTRAVENOUS

## 2017-12-07 MED ORDER — OXYCODONE-ACETAMINOPHEN 5-325 MG PO TABS
1.0000 | ORAL_TABLET | ORAL | Status: DC | PRN
  Administered 2017-12-08 – 2017-12-09 (×4): 2 via ORAL
  Administered 2017-12-11: 1 via ORAL
  Filled 2017-12-07 (×5): qty 2

## 2017-12-07 MED ORDER — PHENYLEPHRINE 40 MCG/ML (10ML) SYRINGE FOR IV PUSH (FOR BLOOD PRESSURE SUPPORT)
PREFILLED_SYRINGE | INTRAVENOUS | Status: DC | PRN
  Administered 2017-12-07: 80 ug via INTRAVENOUS
  Administered 2017-12-07: 40 ug via INTRAVENOUS
  Administered 2017-12-07: 80 ug via INTRAVENOUS

## 2017-12-07 MED ORDER — PROPOFOL 10 MG/ML IV BOLUS
INTRAVENOUS | Status: AC
  Filled 2017-12-07: qty 20

## 2017-12-07 MED ORDER — SUGAMMADEX SODIUM 500 MG/5ML IV SOLN
INTRAVENOUS | Status: AC
  Filled 2017-12-07: qty 5

## 2017-12-07 MED ORDER — LIDOCAINE 2% (20 MG/ML) 5 ML SYRINGE
INTRAMUSCULAR | Status: AC
  Filled 2017-12-07: qty 5

## 2017-12-07 MED ORDER — PANTOPRAZOLE SODIUM 40 MG PO TBEC
40.0000 mg | DELAYED_RELEASE_TABLET | Freq: Every day | ORAL | Status: DC
  Administered 2017-12-08 – 2017-12-11 (×4): 40 mg via ORAL
  Filled 2017-12-07 (×4): qty 1

## 2017-12-07 MED ORDER — GLYCOPYRROLATE 0.2 MG/ML IV SOSY
PREFILLED_SYRINGE | INTRAVENOUS | Status: DC | PRN
  Administered 2017-12-07: .1 mg via INTRAVENOUS

## 2017-12-07 MED ORDER — PROPOFOL 10 MG/ML IV BOLUS
INTRAVENOUS | Status: DC | PRN
  Administered 2017-12-07: 200 mg via INTRAVENOUS

## 2017-12-07 MED ORDER — LIDOCAINE-EPINEPHRINE 1 %-1:100000 IJ SOLN
INTRAMUSCULAR | Status: DC | PRN
  Administered 2017-12-07: 10 mL

## 2017-12-07 MED ORDER — MEPERIDINE HCL 50 MG/ML IJ SOLN: 6.2500 mg | INTRAMUSCULAR | Status: DC | PRN

## 2017-12-07 MED ORDER — LACTATED RINGERS IV SOLN
INTRAVENOUS | Status: DC
  Administered 2017-12-07: 18:00:00 via INTRAVENOUS

## 2017-12-07 MED ORDER — LIDOCAINE 2% (20 MG/ML) 5 ML SYRINGE
INTRAMUSCULAR | Status: DC | PRN
  Administered 2017-12-07: 60 mg via INTRAVENOUS

## 2017-12-07 MED ORDER — BISACODYL 10 MG RE SUPP: 10.0000 mg | Freq: Every day | RECTAL | Status: DC | PRN

## 2017-12-07 MED ORDER — SODIUM CHLORIDE 0.9 % IV SOLN
INTRAVENOUS | Status: DC | PRN
  Administered 2017-12-07: 09:00:00

## 2017-12-07 MED ORDER — ONDANSETRON HCL 4 MG/2ML IJ SOLN
INTRAMUSCULAR | Status: AC
  Filled 2017-12-07: qty 2

## 2017-12-07 MED ORDER — METHOCARBAMOL 1000 MG/10ML IJ SOLN
500.0000 mg | Freq: Four times a day (QID) | INTRAVENOUS | Status: DC | PRN
  Filled 2017-12-07: qty 5

## 2017-12-07 MED ORDER — VANCOMYCIN HCL 10 G IV SOLR
1500.0000 mg | Freq: Once | INTRAVENOUS | Status: AC
  Administered 2017-12-07: 1500 mg via INTRAVENOUS
  Filled 2017-12-07: qty 1500

## 2017-12-07 MED ORDER — HYDROMORPHONE HCL 2 MG/ML IJ SOLN
0.2500 mg | INTRAMUSCULAR | Status: DC | PRN
  Administered 2017-12-07 (×2): 0.5 mg via INTRAVENOUS

## 2017-12-07 MED ORDER — SUCCINYLCHOLINE CHLORIDE 200 MG/10ML IV SOSY
PREFILLED_SYRINGE | INTRAVENOUS | Status: AC
  Filled 2017-12-07: qty 10

## 2017-12-07 MED ORDER — MORPHINE SULFATE (PF) 4 MG/ML IV SOLN
4.0000 mg | INTRAVENOUS | Status: DC | PRN
  Administered 2017-12-07 – 2017-12-08 (×2): 4 mg via INTRAVENOUS
  Filled 2017-12-07 (×2): qty 1

## 2017-12-07 MED ORDER — BUPIVACAINE HCL (PF) 0.5 % IJ SOLN
INTRAMUSCULAR | Status: DC | PRN
  Administered 2017-12-07: 20 mL
  Administered 2017-12-07: 10 mL

## 2017-12-07 MED ORDER — SODIUM CHLORIDE 0.9% FLUSH: 3.0000 mL | INTRAVENOUS | Status: DC | PRN

## 2017-12-07 MED ORDER — FENTANYL CITRATE (PF) 250 MCG/5ML IJ SOLN
INTRAMUSCULAR | Status: AC
  Filled 2017-12-07: qty 5

## 2017-12-07 MED ORDER — THROMBIN 20000 UNITS EX SOLR
CUTANEOUS | Status: AC
  Filled 2017-12-07: qty 20000

## 2017-12-07 MED ORDER — ARTIFICIAL TEARS OPHTHALMIC OINT
TOPICAL_OINTMENT | OPHTHALMIC | Status: AC
  Filled 2017-12-07: qty 3.5

## 2017-12-07 MED ORDER — DOCUSATE SODIUM 100 MG PO CAPS
100.0000 mg | ORAL_CAPSULE | Freq: Two times a day (BID) | ORAL | Status: DC
  Administered 2017-12-07 – 2017-12-11 (×7): 100 mg via ORAL
  Filled 2017-12-07 (×7): qty 1

## 2017-12-07 MED ORDER — HYDROCODONE-ACETAMINOPHEN 5-325 MG PO TABS
2.0000 | ORAL_TABLET | ORAL | Status: DC | PRN
  Administered 2017-12-07 – 2017-12-10 (×8): 2 via ORAL
  Filled 2017-12-07 (×8): qty 2

## 2017-12-07 MED ORDER — SODIUM CHLORIDE 0.9% FLUSH
3.0000 mL | Freq: Two times a day (BID) | INTRAVENOUS | Status: DC
  Administered 2017-12-07: 3 mL via INTRAVENOUS

## 2017-12-07 MED ORDER — MIDAZOLAM HCL 2 MG/2ML IJ SOLN
INTRAMUSCULAR | Status: AC
  Filled 2017-12-07: qty 2

## 2017-12-07 MED ORDER — SIMETHICONE 80 MG PO CHEW
80.0000 mg | CHEWABLE_TABLET | Freq: Four times a day (QID) | ORAL | Status: DC | PRN
  Administered 2017-12-08: 80 mg via ORAL
  Filled 2017-12-07: qty 1

## 2017-12-07 MED ORDER — HYDROMORPHONE HCL 2 MG/ML IJ SOLN
INTRAMUSCULAR | Status: AC
  Filled 2017-12-07: qty 1

## 2017-12-07 MED ORDER — HEMOSTATIC AGENTS (NO CHARGE) OPTIME
TOPICAL | Status: DC | PRN
  Administered 2017-12-07 (×2): 1 via TOPICAL

## 2017-12-07 MED ORDER — FENTANYL CITRATE (PF) 250 MCG/5ML IJ SOLN
INTRAMUSCULAR | Status: DC | PRN
  Administered 2017-12-07: 100 ug via INTRAVENOUS
  Administered 2017-12-07 (×3): 50 ug via INTRAVENOUS

## 2017-12-07 MED ORDER — FUROSEMIDE 20 MG PO TABS: 20.0000 mg | ORAL_TABLET | Freq: Every day | ORAL | Status: DC | PRN

## 2017-12-07 MED ORDER — ALBUMIN HUMAN 5 % IV SOLN
INTRAVENOUS | Status: DC | PRN
  Administered 2017-12-07: 14:00:00 via INTRAVENOUS

## 2017-12-07 MED ORDER — DEXAMETHASONE SODIUM PHOSPHATE 10 MG/ML IJ SOLN
INTRAMUSCULAR | Status: AC
  Filled 2017-12-07: qty 1

## 2017-12-07 MED ORDER — THROMBIN (RECOMBINANT) 20000 UNITS EX SOLR
CUTANEOUS | Status: DC | PRN
  Administered 2017-12-07: 10:00:00 via TOPICAL

## 2017-12-07 MED ORDER — SUGAMMADEX SODIUM 200 MG/2ML IV SOLN
INTRAVENOUS | Status: DC | PRN
Start: 2017-12-07 — End: 2017-12-07
  Administered 2017-12-07: 500 mg via INTRAVENOUS

## 2017-12-07 MED ORDER — BUPIVACAINE HCL (PF) 0.5 % IJ SOLN
INTRAMUSCULAR | Status: AC
  Filled 2017-12-07: qty 30

## 2017-12-07 SURGICAL SUPPLY — 72 items
BAG DECANTER FOR FLEXI CONT (MISCELLANEOUS) ×3 IMPLANT
BASKET BONE COLLECTION (BASKET) ×3 IMPLANT
BLADE CLIPPER SURG (BLADE) IMPLANT
BONE CANC CHIPS 20CC PCAN1/4 (Bone Implant) ×3 IMPLANT
BUR MATCHSTICK NEURO 3.0 LAGG (BURR) ×3 IMPLANT
CAGE COROENT PLIF 10X28-8 LUMB (Cage) ×6 IMPLANT
CANISTER SUCT 3000ML PPV (MISCELLANEOUS) ×3 IMPLANT
CHIPS CANC BONE 20CC PCAN1/4 (Bone Implant) ×1 IMPLANT
CONNECTOR RELINE ROTATING 5-6 (Connector) ×6 IMPLANT
CONT SPEC 4OZ CLIKSEAL STRL BL (MISCELLANEOUS) ×6 IMPLANT
COVER BACK TABLE 60X90IN (DRAPES) ×3 IMPLANT
DECANTER SPIKE VIAL GLASS SM (MISCELLANEOUS) ×3 IMPLANT
DERMABOND ADVANCED (GAUZE/BANDAGES/DRESSINGS) ×2
DERMABOND ADVANCED .7 DNX12 (GAUZE/BANDAGES/DRESSINGS) ×1 IMPLANT
DEVICE DISSECT PLASMABLAD 3.0S (MISCELLANEOUS) ×1 IMPLANT
DRAPE C-ARM 42X72 X-RAY (DRAPES) ×6 IMPLANT
DRAPE HALF SHEET 40X57 (DRAPES) ×3 IMPLANT
DRAPE LAPAROTOMY 100X72X124 (DRAPES) ×3 IMPLANT
DRESSING ALLEVYN LIFE SACRUM (GAUZE/BANDAGES/DRESSINGS) ×3 IMPLANT
DURAPREP 26ML APPLICATOR (WOUND CARE) ×3 IMPLANT
DURASEAL APPLICATOR TIP (TIP) IMPLANT
DURASEAL SPINE SEALANT 3ML (MISCELLANEOUS) IMPLANT
ELECT BLADE 4.0 EZ CLEAN MEGAD (MISCELLANEOUS) ×3
ELECT REM PT RETURN 9FT ADLT (ELECTROSURGICAL) ×3
ELECTRODE BLDE 4.0 EZ CLN MEGD (MISCELLANEOUS) ×1 IMPLANT
ELECTRODE REM PT RTRN 9FT ADLT (ELECTROSURGICAL) ×1 IMPLANT
FLOSEAL 5ML (HEMOSTASIS) ×6 IMPLANT
GAUZE SPONGE 4X4 12PLY STRL (GAUZE/BANDAGES/DRESSINGS) ×3 IMPLANT
GAUZE SPONGE 4X4 16PLY XRAY LF (GAUZE/BANDAGES/DRESSINGS) IMPLANT
GLOVE BIOGEL PI IND STRL 6.5 (GLOVE) ×2 IMPLANT
GLOVE BIOGEL PI IND STRL 8.5 (GLOVE) ×2 IMPLANT
GLOVE BIOGEL PI INDICATOR 6.5 (GLOVE) ×4
GLOVE BIOGEL PI INDICATOR 8.5 (GLOVE) ×4
GLOVE ECLIPSE 8.5 STRL (GLOVE) ×6 IMPLANT
GLOVE ECLIPSE 9.0 STRL (GLOVE) ×3 IMPLANT
GLOVE INDICATOR 6.5 STRL GRN (GLOVE) ×6 IMPLANT
GLOVE INDICATOR 7.0 STRL GRN (GLOVE) ×6 IMPLANT
GLOVE INDICATOR 8.0 STRL GRN (GLOVE) ×3 IMPLANT
GOWN STRL REUS W/ TWL LRG LVL3 (GOWN DISPOSABLE) ×2 IMPLANT
GOWN STRL REUS W/ TWL XL LVL3 (GOWN DISPOSABLE) ×1 IMPLANT
GOWN STRL REUS W/TWL 2XL LVL3 (GOWN DISPOSABLE) ×6 IMPLANT
GOWN STRL REUS W/TWL LRG LVL3 (GOWN DISPOSABLE) ×4
GOWN STRL REUS W/TWL XL LVL3 (GOWN DISPOSABLE) ×2
HEMOSTAT POWDER KIT SURGIFOAM (HEMOSTASIS) IMPLANT
KIT BASIN OR (CUSTOM PROCEDURE TRAY) ×3 IMPLANT
KIT INFUSE SMALL (Orthopedic Implant) ×3 IMPLANT
KIT TURNOVER KIT B (KITS) ×3 IMPLANT
MILL MEDIUM DISP (BLADE) ×3 IMPLANT
MODULE POWER NUVASIVE (MISCELLANEOUS) ×1 IMPLANT
NEEDLE HYPO 22GX1.5 SAFETY (NEEDLE) ×3 IMPLANT
NS IRRIG 1000ML POUR BTL (IV SOLUTION) ×3 IMPLANT
PACK LAMINECTOMY NEURO (CUSTOM PROCEDURE TRAY) ×3 IMPLANT
PAD ARMBOARD 7.5X6 YLW CONV (MISCELLANEOUS) ×9 IMPLANT
PATTIES SURGICAL .5 X1 (DISPOSABLE) ×3 IMPLANT
PATTIES SURGICAL 1X1 (DISPOSABLE) ×3 IMPLANT
PLASMABLADE 3.0S (MISCELLANEOUS) ×3
POWER MODULE NUVASIVE (MISCELLANEOUS) ×3
ROD RELINE TI LATERAL MED OFF (Rod) ×6 IMPLANT
SCREW LOCK RELINE 5.5 TULIP (Screw) ×18 IMPLANT
SCREW RELINE-O 6.5X55 POLY (Screw) ×6 IMPLANT
SPONGE LAP 4X18 X RAY DECT (DISPOSABLE) IMPLANT
SPONGE SURGIFOAM ABS GEL 100 (HEMOSTASIS) ×3 IMPLANT
SUT PROLENE 6 0 BV (SUTURE) IMPLANT
SUT VIC AB 1 CT1 18XBRD ANBCTR (SUTURE) ×2 IMPLANT
SUT VIC AB 1 CT1 8-18 (SUTURE) ×4
SUT VIC AB 2-0 CP2 18 (SUTURE) ×6 IMPLANT
SUT VIC AB 3-0 SH 8-18 (SUTURE) ×6 IMPLANT
SYR 3ML LL SCALE MARK (SYRINGE) ×12 IMPLANT
TOWEL GREEN STERILE (TOWEL DISPOSABLE) ×3 IMPLANT
TOWEL GREEN STERILE FF (TOWEL DISPOSABLE) ×3 IMPLANT
TRAY FOLEY MTR SLVR 16FR STAT (SET/KITS/TRAYS/PACK) ×3 IMPLANT
WATER STERILE IRR 1000ML POUR (IV SOLUTION) ×3 IMPLANT

## 2017-12-07 NOTE — Anesthesia Preprocedure Evaluation (Addendum)
Anesthesia Evaluation  Patient identified by MRN, date of birth, ID band Patient awake    Reviewed: Allergy & Precautions, NPO status , Patient's Chart, lab work & pertinent test results  Airway Mallampati: III  TM Distance: >3 FB Neck ROM: Full    Dental  (+) Teeth Intact, Dental Advisory Given   Pulmonary sleep apnea, Continuous Positive Airway Pressure Ventilation and Oxygen sleep apnea ,    breath sounds clear to auscultation       Cardiovascular hypertension,  Rhythm:Regular Rate:Normal     Neuro/Psych PSYCHIATRIC DISORDERS Depression negative neurological ROS     GI/Hepatic Neg liver ROS, GERD  Medicated,  Endo/Other  negative endocrine ROS  Renal/GU negative Renal ROS     Musculoskeletal negative musculoskeletal ROS (+)   Abdominal (+) + obese,   Peds  Hematology negative hematology ROS (+)   Anesthesia Other Findings   Reproductive/Obstetrics                            Lab Results  Component Value Date   WBC 4.7 11/27/2017   HGB 14.6 11/27/2017   HCT 45.3 11/27/2017   MCV 90.1 11/27/2017   PLT 167 11/27/2017   Lab Results  Component Value Date   CREATININE 1.02 11/27/2017   BUN 16 11/27/2017   NA 138 11/27/2017   K 4.1 11/27/2017   CL 106 11/27/2017   CO2 22 11/27/2017   Lab Results  Component Value Date   INR 1.16 11/27/2017   INR 1.11 02/20/2016   INR 2.2 (H) 03/30/2008   Echo: - Left ventricle: The cavity size was normal. There was moderate concentric hypertrophy. Systolic function was normal. The estimated ejection fraction was in the range of 55% to 60%. Wall motion was normal; there were no regional wall motion   abnormalities. Left ventricular diastolic function parameters   were normal. - Aortic valve: Trileaflet; normal thickness leaflets. There was no   regurgitation. - Right ventricle: The cavity size was moderately dilated. Wall   thickness was normal.  Systolic function was moderately reduced. - Tricuspid valve: There was mild regurgitation. - Pulmonary arteries: Systolic pressure was within the normal   range. - Inferior vena cava: The vessel was normal in size. The   respirophasic diameter changes were in the normal range (= 50%),   consistent with normal central venous pressure. - Pericardium, extracardiac: There was no pericardial effusion.   Anesthesia Physical Anesthesia Plan  ASA: IV  Anesthesia Plan: General   Post-op Pain Management:    Induction: Intravenous  PONV Risk Score and Plan: 3 and Ondansetron, Dexamethasone and Midazolam  Airway Management Planned: Oral ETT  Additional Equipment: None  Intra-op Plan:   Post-operative Plan: Extubation in OR  Informed Consent: I have reviewed the patients History and Physical, chart, labs and discussed the procedure including the risks, benefits and alternatives for the proposed anesthesia with the patient or authorized representative who has indicated his/her understanding and acceptance.   Dental advisory given  Plan Discussed with: CRNA  Anesthesia Plan Comments:        Anesthesia Quick Evaluation

## 2017-12-07 NOTE — Consult Note (Signed)
Name: Richard Sandoval MRN: [id number] DOB: [birth year]    ADMISSION DATE:  12/07/2017 CONSULTATION DATE:  12/07/2017  REFERRING MD :  Dr. Danielle Dess  CHIEF COMPLAINT:  Back pain  HISTORY OF PRESENT ILLNESS:   72 year old male with PMH of morbid obesity, never smoker, OSA on CPAP q HS with O2 2L, GERD, HTN, PH and right heart failure in the setting of DVT and PE in 2017 on Eliquis (which has been held for 3 days prior to surgery), and lumbar stenosis with prior surgery 12 years prior admitted to Neurosurgery on 5/13 for elective L2-L3 posterior laminectomy and fusion.  Patient with progressively worsening back pain and leg weakness over the last 6 months with recent MRI demonstrating severe adjacent level disease at L2-L3. Underwent complex bilateral laminectomies L2-L3 decompression of L2 and L3 nerve with roots on 5/13.  EBL ~835ml; received albumin and 340 ml cell saver intraoperatively.  Patient extubated postoperatively.  PCCM consulted to assist with further pulmonary management given his morbid obesity and OSA.    PAST MEDICAL HISTORY :   has a past medical history of DVT (deep venous thrombosis) (HCC), Dyspnea, GERD (gastroesophageal reflux disease), Hypertension, Morbid obesity (HCC), OSA (obstructive sleep apnea), Pancreatitis, Pulmonary hypertension (HCC) (06/04/2017), and Right heart failure (HCC) (08/27/2017).  has a past surgical history that includes Back surgery (04/09/1999); Knee surgery (1998); left inguinal hernia; Lumbar laminectomy; cervical laminectomy and fusion; Trigger finger release (08/04/2012); EUS (N/A, 01/24/2014); Cholecystectomy (N/A, 01/25/2014); and Colonoscopy.   Prior to Admission medications   Medication Sig Start Date End Date Taking? Authorizing Provider  acetaminophen (TYLENOL) 325 MG tablet Take 325 mg by mouth daily as needed for moderate pain or headache.   Yes [provider]  apixaban (ELIQUIS) 5 MG TABS tablet Take 5 mg by mouth 2 (two) times daily.   Yes  [provider]  aspirin EC 81 MG tablet Take 81 mg by mouth daily.   Yes [provider]  furosemide (LASIX) 20 MG tablet Take 20 mg by mouth as needed (SWELLING).   Yes [provider]  pantoprazole (PROTONIX) 40 MG tablet Take 1 tablet (40 mg total) daily by mouth. 06/04/17  Yes Chilton Si, MD  simethicone (MYLICON) 125 MG chewable tablet Chew 125 mg by mouth every 6 (six) hours as needed for flatulence.   Yes [provider]  OXYGEN 2lpm  with CPAP  [facility]    [provider]   Allergies  Allergen Reactions  . Penicillins Itching and Other (See Comments)    Has patient had a PCN reaction causing immediate rash, facial/tongue/throat swelling, SOB or lightheadedness with hypotension: No Has patient had a PCN reaction causing severe rash involving mucus membranes or skin necrosis: No Has patient had a PCN reaction that required hospitalization No Has patient had a PCN reaction occurring within the last 10 years: No If all of the above answers are "NO", then may proceed with Cephalosporin use.    FAMILY HISTORY:  family history includes Breast cancer in his mother; Diabetes in his sister; Gout in his father; Heart disease in his brother; Hypertension in his father. SOCIAL HISTORY:  reports that he has never smoked. He has never used smokeless tobacco. He reports that he does not drink alcohol or use drugs.  REVIEW OF SYSTEMS:  Positives in bold Constitutional: Negative for fever, chills, weight loss, malaise/fatigue and diaphoresis.  HENT: Negative for congestion, sore throat, neck pain   Eyes: Negative for blurred vision, double vision  Respiratory: Negative for cough, hemoptysis, sputum production, shortness of breath, wheezing and stridor.   Cardiovascular: Negative for chest pain, palpitations, orthopnea, leg swelling Gastrointestinal: Negative for nausea, vomiting, abdominal pain, diarrhea, constipation Genitourinary: Negative for  dysuria, urgency, frequency  Musculoskeletal: Negative for myalgias, back pain, joint pain and falls.  Skin: Negative for itching and rash.  Neurological: Negative for tingling, sensory change,  focal weakness,  weakness of BLE   SUBJECTIVE:  On 2L [place] No complaints of chest pain or SOB C/o of back pain post-operatively, denies BLE numbness/ tingling No issues per RN in PACU  VITAL SIGNS: Temp:  [97.2 F (36.2 C)-98.1 F (36.7 C)] 97.2 F (36.2 C) (05/13 1530) Pulse Rate:  [69-73] 69 (05/13 1615) Resp:  [13-20] 14 (05/13 1615) BP: (95-143)/(49-117) 107/61 (05/13 1615) SpO2:  [98 %-100 %] 98 % (05/13 1615)  PHYSICAL EXAMINATION: General:  Younger than stated age, morbidly obese adult male lying almost supine in PACU bed in no distress HEENT: MM pink/moist, pupils round/reactive Neuro:  Alert, oriented, moves legs, denies sensory change CV: RRR, normotensive PULM: even/non-labored, anteriorly clear, diminished in bases GI: obese, soft, non-tender, bs hypoactive  Extremities: warm/dry, trace BLE edema, RLE with venous stasis changes, lumbar dressing clean and dry except for small area on right lower corner. Skin: no rashes  No results for input(s): NA, K, CL, CO2, BUN, CREATININE, GLUCOSE in the last 168 hours. No results for input(s): HGB, HCT, WBC, PLT in the last 168 hours. Dg Lumbar Spine 2-3 Views  Result Date: 12/07/2017 CLINICAL DATA:  Status post L2-3 PLIF EXAM: DG C-ARM 61-120 MIN; LUMBAR SPINE - 2-3 VIEW COMPARISON:  Lumbar spine series of October 01, 2017 FINDINGS: Reported fluoro time is 26 seconds. Two fluoro spot images are reviewed. The patient has undergone posterior fusion and intradiscal device placement at L2-3. There is pre-existing posterior fusion at L3-4 and L4-5. There is no immediate postprocedure complication. IMPRESSION: Status post PLIF at L2-3 without evidence of immediate postprocedure complication. Electronically Signed   By: David  [place] M.D.   On:  12/07/2017 14:54   Dg C-arm 1-60 Min  Result Date: 12/07/2017 CLINICAL DATA:  Status post L2-3 PLIF EXAM: DG C-ARM 61-120 MIN; LUMBAR SPINE - 2-3 VIEW COMPARISON:  Lumbar spine series of October 01, 2017 FINDINGS: Reported fluoro time is 26 seconds. Two fluoro spot images are reviewed. The patient has undergone posterior fusion and intradiscal device placement at L2-3. There is pre-existing posterior fusion at L3-4 and L4-5. There is no immediate postprocedure complication. IMPRESSION: Status post PLIF at L2-3 without evidence of immediate postprocedure complication. Electronically Signed   By: David  [place] M.D.   On: 12/07/2017 14:54   SIGNIFICANT EVENTS  5/13  Admit/ OR  Brief Patient Summary:  72 year old male with PMH of morbid obesity, OSA on CPAP q HS with O2 2L, and prior DVT/ PE in 2017 on Eliquis (which has been held for 3 days prior to surgery) admitted to Neurosurgery on 5/13 for elective L2-L3 posterior laminectomy and fusion.  ASSESSMENT / PLAN:  OSA on CPAP and O2 2L q HS At risk for airway insufficiency given post-operative setting, pain, and hx of OSA/ Morbid obesity  Hx of DVT and PE 2017 on Eliquis (held for 3 days prior to surgery) P:  Monitor closely for need of intubation Continue CPAP when sleeping Supplemental O2 prn and q HS  Aggressive Pulmonary hygiene with IS Holding Eliquis for now given bleeding risk   Severe spinal spondylosis  with stenosis L2-3 s/p decompression and fusion P:  Per neurosurgery To remain in ICU for close monitoring PT/ OT eval Pain management per NSGY  Chronic RV failure HTN - PH noted after PE; repeat TTE in 05/2017 with LVEF 55-60%, normal diastolic function, moderately dilated and reduced RV systolic function; RVSP now normal ( may be underestimated given RV dysfunction) - currently hemodynamically stable P:  Hold asa and lasix for now (not taking lasix at home) Goal MAP > 65 Daily weights   DVT prophylaxis: SCDs only  SUP:  not indicated/ Protonix for GERD Diet: NPO Activity: per NSGY Disposition : ICU    Posey Boyer, AGACNP-BC [facility] Pgr: [phone] or if no answer [phone] 12/07/2017, 5:28 PM

## 2017-12-07 NOTE — H&P (Signed)
Richard Sandoval is an 72 y.o. male.   Chief Complaint: Back and bilateral leg pain and weakness HPI: Richard Sandoval is a 72 year old individual whom I have known for over 20 years.  He initially presented with some lumbar stenosis and required surgical decompression.  He also had cervical stenosis and required decompression there then approximately 12 years ago he underwent surgical decompression and fusion from L3-L5.  He did well after that surgery but now has developed increasing pain in his back and his legs with decreased stamina in his gait.  An MRI demonstrates the presence of severe stenosis at L2-L3.  He is been advised regarding the need for surgical decompression and stabilization of this adjacent joint.  Past Medical History:  Diagnosis Date  . DVT (deep venous thrombosis) (HCC)    L leg in 2009  . Dyspnea   . GERD (gastroesophageal reflux disease)   . Hypertension    enlarged heart  . Morbid obesity (HCC)   . OSA (obstructive sleep apnea)    on CPAP since 1995  . Pancreatitis   . Pulmonary hypertension (HCC) 06/04/2017  . Right heart failure (HCC) 08/27/2017    Past Surgical History:  Procedure Laterality Date  . BACK SURGERY  04/09/1999  . cervical laminectomy and fusion    . CHOLECYSTECTOMY N/A 01/25/2014   Procedure: LAPAROSCOPIC CHOLECYSTECTOMY WITH INTRAOPERATIVE CHOLANGIOGRAM;  Surgeon: Velora Heckler, MD;  Location: [facility];  Service: General;  Laterality: N/A;  . COLONOSCOPY    . EUS N/A 01/24/2014   Procedure: UPPER ENDOSCOPIC ULTRASOUND (EUS) RADIAL;  Surgeon: Willis Modena, MD;  Location: [facility];  Service: Endoscopy;  Laterality: N/A;  . KNEE SURGERY  1998  . left inguinal hernia    . LUMBAR LAMINECTOMY    . TRIGGER FINGER RELEASE  08/04/2012   Procedure: MINOR RELEASE TRIGGER FINGER/A-1 PULLEY;  Surgeon: Nicki Reaper, MD;  Location: [facility];  Service: Orthopedics;  Laterality: Right;    Family History  Problem Relation Age of Onset  . Breast  cancer Mother   . Gout Father   . Hypertension Father   . Diabetes Sister   . Heart disease Brother    Social History:  reports that he has never smoked. He has never used smokeless tobacco. He reports that he does not drink alcohol or use drugs.  Allergies:  Allergies  Allergen Reactions  . Penicillins Itching and Other (See Comments)    Has patient had a PCN reaction causing immediate rash, facial/tongue/throat swelling, SOB or lightheadedness with hypotension: No Has patient had a PCN reaction causing severe rash involving mucus membranes or skin necrosis: No Has patient had a PCN reaction that required hospitalization No Has patient had a PCN reaction occurring within the last 10 years: No If all of the above answers are "NO", then may proceed with Cephalosporin use.    Medications Prior to Admission  Medication Sig Dispense Refill  . acetaminophen (TYLENOL) 325 MG tablet Take 325 mg by mouth daily as needed for moderate pain or headache.    [name] apixaban (ELIQUIS) 5 MG TABS tablet Take 5 mg by mouth 2 (two) times daily.    [name] aspirin EC 81 MG tablet Take 81 mg by mouth daily.    . furosemide (LASIX) 20 MG tablet Take 20 mg by mouth as needed (SWELLING).    . pantoprazole (PROTONIX) 40 MG tablet Take 1 tablet (40 mg total) daily by mouth. 30 tablet 11  . simethicone (MYLICON) 125 MG  chewable tablet Chew 125 mg by mouth every 6 (six) hours as needed for flatulence.    . OXYGEN 2lpm  with CPAP  [facility]      Results for orders placed or performed during the hospital encounter of 12/07/17 (from the past 48 hour(s))  Protime-INR     Status: None   Collection Time: 12/07/17  8:18 AM  Result Value Ref Range   Prothrombin Time 12.8 11.4 - 15.2 seconds   INR 0.97     Comment: Performed at [facility], [address], [place], [place] [address]   No results found.  Review of Systems  Constitutional: Negative.   HENT: Negative.   Respiratory:       History of significant sleep  apnea  Cardiovascular:       History of coronary disease.  Gastrointestinal:       History of morbid obesity with BMI of 59  Genitourinary: Negative.   Musculoskeletal: Positive for back pain.  Skin: Negative.   Neurological: Positive for focal weakness.       Easy fatigability  Endo/Heme/Allergies: Negative.     Blood pressure (!) 142/63, pulse 70, temperature 98.1 F (36.7 C), temperature source Oral, resp. rate 20, SpO2 100 %. Physical Exam  Constitutional: He is oriented to person, place, and time. He appears well-developed and well-nourished.  Morbidly obese  HENT:  Head: Normocephalic and atraumatic.  Eyes: Pupils are equal, round, and reactive to light. Conjunctivae and EOM are normal.  Neck: Normal range of motion. Neck supple.  Cardiovascular: Normal rate and regular rhythm.  Respiratory: Effort normal and breath sounds normal.  GI: Soft. Bowel sounds are normal.  Musculoskeletal: Normal range of motion.  Nontender to palpation and percussion in the back straight leg raising is negative to 60 degrees in either lower extremity Patrick's maneuver is negative bilaterally  Neurological: He is alert and oriented to person, place, and time.  Mild weakness in the quadriceps bilaterally.  Absent reflexes in the patellae and the Achilles.  Upper extremity strength and reflexes normal.  Cranial nerve examination is normal.     Assessment/Plan Spinal stenosis L2-L3 status post decompression and fusion L3-L5.  Plan: Decompression and fusion L2-3 with Adderall and fusion to L5.  Stefani Dama, MD 12/07/2017, 9:52 AM

## 2017-12-07 NOTE — Op Note (Signed)
Date of surgery: 12/07/2017 Preoperative diagnosis: Spondylosis with stenosis L2-3, status post decompression and fusion L3-L5. Postoperative diagnosis: Same Procedure: Bilateral laminectomies L2-L3 decompression of the L2 and the L3 nerve roots with more work than required for simple interbody technique.  Posterior lumbar interbody arthrodesis using peek spacers local autograft and allograft with infuse.  Posterior lateral arthrodesis with local autograft at allograft and infuse L2-L3 with pedicle fixation of L2 vertebrae to L3-L5 construct. Surgeon: Barnett Abu  First assistant: Lelon Perla MD. Anesthesia: General endotracheal Indications: Richard Sandoval is a 72 year old individual has had significant back and bilateral lower extremity issues in the last 6 months that have gotten progressively worse.  About 12 years ago he underwent surgical decompression and stabilization for stenosis at L2-3-4 and L4-5.  Tolerated surgery well and did well since he has been having increasing problems her recent MRI demonstrates that the patient now has severe adjacent level disease at L2-L3.'s been advised regarding the need for surgery.  Procedure: Patient was brought to the operating room supine on the stretcher.  After carefully turning him prone the back was prepped with alcohol DuraPrep and draped in a sterile fashion.  Midline incision was made through his old incision bed on the superior aspect of it was carried down to the lumbodorsal fascia which was opened on either side of midline.  The old hardware was identified with the L3 screws being identified and skeletonized.  The short portion of the rod underneath this was also skeletonized and a side to side connector was then placed on the old rods on either side.  In the dissection was carried superiorly to expose the interlaminar space at L2-L3.  Transverse processes of L2 were decorticated and pedicle entry sites were chosen in the medial aspect of the transverse  process at its junction with the pars.  This was left side for later use and pedicle screw fixation.  Then laminectomies were created removing the inferior margin lamina of L2 L2 and including the entirety of the facet at L2-L3.  Yellow ligament was taken off.  Common dural tube was exposed.  Then carefully the L2 nerve roots were decompressed superiorly the L3 nerve roots were decompressed inferiorly using a combination of 2 and 3 mm Kerrison punches.  The disc space was then isolated on either side.  Ultimately a total discectomy was performed at L2-L3 removing all the disc within the interspace in addition to along the annular borders.  Once the disc space was cleared out completely and the endplates were prepared by decorticating them peek spacers were chosen that measured 8 degrees lordosis 23 mm in length 10 mm in height.  These were filled with autograft allograft and infuse.  A total of 15 cc of the grafting mixture was placed into the interspace along with the 2 spacers.  Attention was then turned to the pedicle entry sites at L2 and 6.5 x 55 mm screws were placed into the vertebral body of L2 through the pedicles using fluoroscopic guidance.  The screws were then connected to the previous construct using Z rod to connect the pedicle screws to the transverse connectors.  This was done in a neutral fashion and final radiographs identified good reduction and maintenance of fixation at the L2-L3 level.  Lateral gutters were then packed after being decorticated with a total of 6 cc of bone graft material in either lateral gutter.  Once this was accomplished hemostasis was checked the status of the decompression was checked to make sure  that the L2 and L3 nerve roots were fully free and clear and when this was verified the lumbodorsal fascia was closed with #1 Vicryl 2-0 Vicryl was used in the subcutaneous tissues and 3-0 Vicryl subcuticularly.  Blood loss is estimated 800 cc and 340 cc of Cell Saver blood was  returned to the patient.

## 2017-12-07 NOTE — Progress Notes (Signed)
Pt set up on Cpap [id number] with 3 lpm bled in pt has his own mask tolerating well.

## 2017-12-07 NOTE — Transfer of Care (Signed)
Immediate Anesthesia Transfer of Care Note  Patient: Richard Sandoval  Procedure(s) Performed: Lumbar two-three Posterior lumbar interbody fusion (N/A Back)  Patient Location: PACU  Anesthesia Type:General  Level of Consciousness: awake, alert  and oriented  Airway & Oxygen Therapy: Patient Spontanous Breathing and Patient connected to face mask oxygen  Post-op Assessment: Report given to RN and Post -op Vital signs reviewed and stable  Post vital signs: Reviewed and stable  Last Vitals:  Vitals Value Taken Time  BP 143/117 12/07/2017  3:29 PM  Temp    Pulse 73 12/07/2017  3:33 PM  Resp 19 12/07/2017  3:33 PM  SpO2 100 % 12/07/2017  3:33 PM  Vitals shown include unvalidated device data.  Last Pain:  Vitals:   12/07/17 0845  TempSrc:   PainSc: 6       Patients Stated Pain Goal: 1 (12/07/17 0845)  Complications: No apparent anesthesia complications

## 2017-12-07 NOTE — Progress Notes (Signed)
ANTIBIOTIC CONSULT NOTE - INITIAL  Pharmacy Consult for Vancomycin x 1 dose post op (no drain) Indication: surgical prophylaxis  Allergies  Allergen Reactions  . Penicillins Itching and Other (See Comments)    Has patient had a PCN reaction causing immediate rash, facial/tongue/throat swelling, SOB or lightheadedness with hypotension: No Has patient had a PCN reaction causing severe rash involving mucus membranes or skin necrosis: No Has patient had a PCN reaction that required hospitalization No Has patient had a PCN reaction occurring within the last 10 years: No If all of the above answers are "NO", then may proceed with Cephalosporin use.    Patient Measurements: Weight = 192.1 kg Height= 5 ft 10.5 in.  Adjusted Body Weight:   Vital Signs: Temp: 97.2 F (36.2 C) (05/13 1733) Temp Source: Oral (05/13 1530) BP: 108/57 (05/13 1733) Pulse Rate: 75 (05/13 1733) Intake/Output from previous day: No intake/output data recorded. Intake/Output from this shift: Total I/O In: 2590 [I.V.:2000; Blood:340; IV Piggyback:250] Out: 1250 [Urine:450; Blood:800]  Labs: No results for input(s): WBC, HGB, PLT, LABCREA, CREATININE in the last 72 hours. Estimated Creatinine Clearance: 114.1 mL/min (by C-G formula based on SCr of 1.02 mg/dL). No results for input(s): VANCOTROUGH, VANCOPEAK, VANCORANDOM, GENTTROUGH, GENTPEAK, GENTRANDOM, TOBRATROUGH, TOBRAPEAK, TOBRARND, AMIKACINPEAK, AMIKACINTROU, AMIKACIN in the last 72 hours.   Microbiology: Recent Results (from the past 720 hour(s))  Surgical pcr screen     Status: None   Collection Time: 11/27/17  9:17 AM  Result Value Ref Range Status   MRSA, PCR NEGATIVE NEGATIVE Final   Staphylococcus aureus NEGATIVE NEGATIVE Final    Comment: (NOTE) The Xpert SA Assay (FDA approved for NASAL specimens in patients 31 years of age and older), is one component of a comprehensive surveillance program. It is not intended to diagnose infection nor  to guide or monitor treatment. Performed at [facility], [address] [place], [place] [address]     Medical History: Past Medical History:  Diagnosis Date  . DVT (deep venous thrombosis) (HCC)    L leg in 2009  . Dyspnea   . GERD (gastroesophageal reflux disease)   . Hypertension    enlarged heart  . Morbid obesity (HCC)   . OSA (obstructive sleep apnea)    on CPAP since 1995  . Pancreatitis   . Pulmonary hypertension (HCC) 06/04/2017  . Right heart failure (HCC) 08/27/2017    Medications:  Medications Prior to Admission  Medication Sig Dispense Refill Last Dose  . acetaminophen (TYLENOL) 325 MG tablet Take 325 mg by mouth daily as needed for moderate pain or headache.   Past Week at Unknown time  . apixaban (ELIQUIS) 5 MG TABS tablet Take 5 mg by mouth 2 (two) times daily.   12/03/17  . aspirin EC 81 MG tablet Take 81 mg by mouth daily.   12/03/17  . furosemide (LASIX) 20 MG tablet Take 20 mg by mouth as needed (SWELLING).   Past Week at Unknown time  . pantoprazole (PROTONIX) 40 MG tablet Take 1 tablet (40 mg total) daily by mouth. 30 tablet 11 Past Week at Unknown time  . simethicone (MYLICON) 125 MG chewable tablet Chew 125 mg by mouth every 6 (six) hours as needed for flatulence.   Past Week at Unknown time  . OXYGEN 2lpm  with CPAP  [facility]   Taking   Scheduled:  . docusate sodium  100 mg Oral BID  . HYDROmorphone      . pantoprazole  40 mg  Oral Daily  . senna  1 tablet Oral BID  . sodium chloride flush  3 mL Intravenous Q12H   Assessment: 72 y.o male , morbidly obese, with spondylosis w/stenosis. S/p lumbar decompression and fusion today. Vancomycin  IV x1 preop given this AM at 08:20.  SCr 1.02, estimated CrCl 100 ml/min. No drain noted.    Goal of Therapy: prevent infection post op Vancomycin trough = 10-15 mcg/ml  Plan:  Vancomycin 1500 mg IV x1 tonight at 21:00  Pharmacy will sign off.  Re-consult pharmacy if vancomycin needs to be continued.    Thank you for allowing pharmacy to be part of this patients care team.  Noah Delaine, RPh Clinical Pharmacist Pager: [phone]  [phone] (4p-10p) [facility] Pharm[facility] [phone] 12/07/2017,5:55 PM

## 2017-12-07 NOTE — Anesthesia Procedure Notes (Signed)
Procedure Name: Intubation Date/Time: 12/07/2017 11:27 AM Performed by: Izola Price., CRNA Pre-anesthesia Checklist: Patient identified, Emergency Drugs available, Suction available and Patient being monitored Patient Re-evaluated:Patient Re-evaluated prior to induction Oxygen Delivery Method: Circle system utilized Preoxygenation: Pre-oxygenation with 100% oxygen Induction Type: IV induction Ventilation: Mask ventilation without difficulty and Oral airway inserted - appropriate to patient size Laryngoscope Size: Glidescope and 4 Grade View: Grade I Tube type: Oral Tube size: 8.0 mm Number of attempts: 1 Airway Equipment and Method: Stylet and Oral airway Placement Confirmation: ETT inserted through vocal cords under direct vision,  positive ETCO2 and breath sounds checked- equal and bilateral Secured at: 24 cm Tube secured with: Tape Dental Injury: Teeth and Oropharynx as per pre-operative assessment

## 2017-12-07 NOTE — Anesthesia Postprocedure Evaluation (Signed)
Anesthesia Post Note  Patient: Toriano Aikey  Procedure(s) Performed: Lumbar two-three Posterior lumbar interbody fusion (N/A Back)     Patient location during evaluation: PACU Anesthesia Type: General Level of consciousness: awake and alert Pain management: pain level controlled Vital Signs Assessment: post-procedure vital signs reviewed and stable Respiratory status: spontaneous breathing, nonlabored ventilation, respiratory function stable and patient connected to nasal cannula oxygen Cardiovascular status: blood pressure returned to baseline and stable Postop Assessment: no apparent nausea or vomiting Anesthetic complications: no    Last Vitals:  Vitals:   12/07/17 1733 12/07/17 1800  BP: (!) 108/57 (!) 159/71  Pulse: 75 79  Resp: 15 17  Temp: (!) 36.2 C (!) 36.4 C  SpO2: 95% 100%    Last Pain:  Vitals:   12/07/17 1800  TempSrc: Oral  PainSc: 6                  Trinidi Toppins,W. EDMOND

## 2017-12-08 DIAGNOSIS — M48062 Spinal stenosis, lumbar region with neurogenic claudication: Principal | ICD-10-CM

## 2017-12-08 LAB — BASIC METABOLIC PANEL
Anion gap: 8 (ref 5–15)
Anion gap: 8 (ref 5–15)
BUN: 14 mg/dL (ref 6–20)
BUN: 16 mg/dL (ref 6–20)
CALCIUM: 8.4 mg/dL — AB (ref 8.9–10.3)
CO2: 24 mmol/L (ref 22–32)
CO2: 27 mmol/L (ref 22–32)
CREATININE: 0.95 mg/dL (ref 0.61–1.24)
CREATININE: 1.3 mg/dL — AB (ref 0.61–1.24)
Calcium: 8.6 mg/dL — ABNORMAL LOW (ref 8.9–10.3)
Chloride: 104 mmol/L (ref 101–111)
Chloride: 103 mmol/L (ref 101–111)
GFR calc Af Amer: >60 mL/min (ref >60)
GFR calc Af Amer: >60 mL/min (ref >60)
GFR calc non Af Amer: >60 mL/min (ref >60)
GFR, EST NON AFRICAN AMERICAN: 54 mL/min — AB (ref >60)
GLUCOSE: 126 mg/dL — AB (ref 65–99)
GLUCOSE: 104 mg/dL — AB (ref 65–99)
POTASSIUM: 4.9 mmol/L (ref 3.5–5.1)
POTASSIUM: 4.1 mmol/L (ref 3.5–5.1)
SODIUM: 136 mmol/L (ref 135–145)
Sodium: 138 mmol/L (ref 135–145)

## 2017-12-08 LAB — CBC
HCT: 40.7 % (ref 39.0–52.0)
HCT: 39.2 % (ref 39.0–52.0)
Hemoglobin: 13.2 g/dL (ref 13.0–17.0)
Hemoglobin: 12.3 g/dL — ABNORMAL LOW (ref 13.0–17.0)
MCH: 28.9 pg (ref 26.0–34.0)
MCH: 28.5 pg (ref 26.0–34.0)
MCHC: 32.4 g/dL (ref 30.0–36.0)
MCHC: 31.4 g/dL (ref 30.0–36.0)
MCV: 89.3 fL (ref 78.0–100.0)
MCV: 90.7 fL (ref 78.0–100.0)
PLATELETS: 144 10*3/uL — AB (ref 150–400)
Platelets: 168 10*3/uL (ref 150–400)
RBC: 4.56 MIL/uL (ref 4.22–5.81)
RBC: 4.32 MIL/uL (ref 4.22–5.81)
RDW: 13.6 % (ref 11.5–15.5)
RDW: 13.4 % (ref 11.5–15.5)
WBC: 11.3 10*3/uL — ABNORMAL HIGH (ref 4.0–10.5)
WBC: 10.9 10*3/uL — ABNORMAL HIGH (ref 4.0–10.5)

## 2017-12-08 MED ORDER — CHLORHEXIDINE GLUCONATE 0.12 % MT SOLN
15.0000 mL | Freq: Two times a day (BID) | OROMUCOSAL | Status: DC
  Administered 2017-12-08 – 2017-12-11 (×7): 15 mL via OROMUCOSAL
  Filled 2017-12-08 (×7): qty 15

## 2017-12-08 MED ORDER — APIXABAN 5 MG PO TABS
5.0000 mg | ORAL_TABLET | Freq: Two times a day (BID) | ORAL | Status: DC
  Administered 2017-12-08 – 2017-12-11 (×6): 5 mg via ORAL
  Filled 2017-12-08 (×6): qty 1

## 2017-12-08 MED ORDER — ORAL CARE MOUTH RINSE
15.0000 mL | Freq: Two times a day (BID) | OROMUCOSAL | Status: DC
  Administered 2017-12-09 – 2017-12-10 (×3): 15 mL via OROMUCOSAL

## 2017-12-08 NOTE — Care Management Note (Signed)
Case Management Note  Patient Details  Name: Richard Sandoval MRN: [id number] Date of Birth: [birth year]  Subjective/Objective: Pt admitted on 12/07/17 for elective L2-L3 posterior laminectomy and fusion.  PTA, pt independent, lives with spouse.                     Action/Plan: Will follow for home needs as pt progresses.  PT/OT evals pending.    Expected Discharge Date:                  Expected Discharge Plan:  Home w Home Health Services  In-House Referral:     Discharge planning Services  CM Consult  Post Acute Care Choice:    Choice offered to:     DME Arranged:    DME Agency:     HH Arranged:    HH Agency:     Status of Service:  In process, will continue to follow  If discussed at Long Length of Stay Meetings, dates discussed:    Additional Comments:  Quintella Baton, RN, BSN  Trauma/Neuro ICU Case Manager [phone]

## 2017-12-08 NOTE — Progress Notes (Addendum)
0715 Bedside shift report, pt awake watching TV. Pt c/o of pain with movement, refusing pain meds at this time, asked to bring with AM meds. Fall precautions in place, [facility]   0800 Pt assessed, see flow sheet. Pt with scant drainage to dressage on mid, lower back. WCTM.   0945 Pt medicated per MAR, using incentive spirometer well. Awaiting physical therapy to see pt this am.   1100 Pt bathed, up to chair, tolerated well. Resting comfortably in chair, WCTM.   1200 Pulmonary NP here to see pt, new orders to wean O2, WCTM.

## 2017-12-08 NOTE — Progress Notes (Signed)
Name: Richard Sandoval MRN: [id number] DOB: [birth year]    ADMISSION DATE:  12/07/2017 CONSULTATION DATE:  12/07/2017  REFERRING MD :  Dr. Danielle Dess  CHIEF COMPLAINT:  Back pain  HISTORY OF PRESENT ILLNESS:   72 year old male with PMH of morbid obesity, never smoker, OSA on CPAP q HS with O2 2L, GERD, HTN, PH and right heart failure in the setting of DVT and PE in 2017 on Eliquis (which has been held for 3 days prior to surgery), and lumbar stenosis with prior surgery 12 years prior admitted to Neurosurgery on 5/13 for elective L2-L3 posterior laminectomy and fusion.  Patient with progressively worsening back pain and leg weakness over the last 6 months with recent MRI demonstrating severe adjacent level disease at L2-L3. Underwent complex bilateral laminectomies L2-L3 decompression of L2 and L3 nerve with roots on 5/13.  EBL ~869ml; received albumin and 340 ml cell saver intraoperatively.  Patient extubated postoperatively.  PCCM consulted to assist with further pulmonary management given his morbid obesity and OSA.    SUBJECTIVE: Wore CPAP overnight, used his nasal mask from home.  Reports he has worn a CPAP for ~ 20 years.  Has O2 bled in at night but not sure how much.  Does not wear O2 during the day.  Last sleep study ~ 1 year ago. Feels pain 7/10. States he takes his lasix 2-3 x per week at home / PRN swelling.  Pt able to pull 2500 on IS.  VITAL SIGNS: Temp:  [97.2 F (36.2 C)-99 F (37.2 C)] 98.8 F (37.1 C) (05/14 0841) Pulse Rate:  [65-84] 74 (05/14 0300) Resp:  [11-21] 15 (05/14 0300) BP: (91-177)/(49-117) 144/67 (05/14 0300) SpO2:  [94 %-100 %] 100 % (05/14 0300) Weight:  [423 lb 4.5 oz (192 kg)] 423 lb 4.5 oz (192 kg) (05/13 1807)  PHYSICAL EXAMINATION: General: adult male sitting up in chair, back brace in place  HEENT: MM pink/moist, no jvd Neuro: AAOx4, speech clear, MAE  CV: s1s2 rrr, no m/r/g PULM: even/non-labored, lungs bilaterally clear  WU:JWJX, non-tender, bsx4  active  Extremities: warm/dry, trace BLE edema, changes c/w chronic venous stasis.   Skin: no rashes or lesions  Recent Labs  Lab 12/08/17 0208  NA 136  K 4.9  CL 104  CO2 24  BUN 14  CREATININE 0.95  GLUCOSE 126*   Recent Labs  Lab 12/08/17 0208  HGB 13.2  HCT 40.7  WBC 11.3*  PLT 168   Dg Lumbar Spine 2-3 Views  Result Date: 12/07/2017 CLINICAL DATA:  Status post L2-3 PLIF EXAM: DG C-ARM 61-120 MIN; LUMBAR SPINE - 2-3 VIEW COMPARISON:  Lumbar spine series of October 01, 2017 FINDINGS: Reported fluoro time is 26 seconds. Two fluoro spot images are reviewed. The patient has undergone posterior fusion and intradiscal device placement at L2-3. There is pre-existing posterior fusion at L3-4 and L4-5. There is no immediate postprocedure complication. IMPRESSION: Status post PLIF at L2-3 without evidence of immediate postprocedure complication. Electronically Signed   By: David  [place] M.D.   On: 12/07/2017 14:54   Dg C-arm 1-60 Min  Result Date: 12/07/2017 CLINICAL DATA:  Status post L2-3 PLIF EXAM: DG C-ARM 61-120 MIN; LUMBAR SPINE - 2-3 VIEW COMPARISON:  Lumbar spine series of October 01, 2017 FINDINGS: Reported fluoro time is 26 seconds. Two fluoro spot images are reviewed. The patient has undergone posterior fusion and intradiscal device placement at L2-3. There is pre-existing posterior fusion at L3-4 and L4-5. There is no immediate  postprocedure complication. IMPRESSION: Status post PLIF at L2-3 without evidence of immediate postprocedure complication. Electronically Signed   By: David  [place] M.D.   On: 12/07/2017 14:54   SIGNIFICANT EVENTS  5/13  Admit / OR  Brief Patient Summary:  72 year old male with PMH of morbid obesity, OSA on CPAP q HS with O2 2L, and prior DVT/ PE in 2017 on Eliquis (which has been held for 3 days prior to surgery) admitted to Neurosurgery on 5/13 for elective L2-L3 posterior laminectomy and fusion.  ASSESSMENT / PLAN:  OSA on CPAP and O2 2L q HS At  risk for airway insufficiency given post-operative setting, pain, and hx of OSA/ Morbid obesity  Hx of DVT and PE 2017 on Eliquis (held for 3 days prior to surgery) P:  Pulmonary hygiene - IS, mobilize  CPAP QHS & PRN daytime sleep  O2 as needed for sats >90% Hold eliquis for now, consider restart in next 24-48 hours.  Will wait for OK from NSGY.   Severe spinal spondylosis with stenosis L2-3 s/p decompression and fusion P:  Per NSGY  SDU monitoring  PT / OT efforts  Pain control per primary   Chronic RV failure HTN - PH noted after PE; repeat TTE in 05/2017 with LVEF 55-60%, normal diastolic function, moderately dilated and reduced RV systolic function; RVSP now normal ( may be underestimated given RV dysfunction) - currently hemodynamically stable P:  Monitor volume status, daily weights Hold home ASA May need PRN lasix, monitor exam    DVT prophylaxis: SCDs only, see above SUP: not indicated/ Protonix for GERD Diet: Diet as tolerated  Activity: per NSGY Disposition : SDU   Canary Brim, NP-C [facility] Pgr: [phone] or if no answer [phone] 12/08/2017, 12:06 PM

## 2017-12-08 NOTE — Social Work (Signed)
CSW acknowledging consult for SNF placement, pt recommendations are for no PT/OT follow up.   CSW signing off. Please consult if any additional needs arise.  Doy Hutching, LCSWA [facility] Clinical Social Work [phone]

## 2017-12-08 NOTE — Progress Notes (Signed)
Physical Therapy Evaluation Patient Details Name: Richard Sandoval MRN: [id number] DOB: Dec 20, 1945 Today's Date: 12/08/2017   History of Present Illness  pt is a 72 y/o male with pmh significant for OSA, HTN, h/o lumbar surgery, admitted with bil leg pain and weakness, s/p bil lami's, decompression and PLIF with fixation and autograft at L2-L3.  Clinical Impression  Pt admitted with/for lumbar fusion surgery.  Pt mobilizing presently at min guard to min level.  Pt currently limited functionally due to the problems listed below.  (see problems list.)  Pt will benefit from PT to maximize function and safety to be able to get home safely with available assist .     Follow Up Recommendations No PT follow up;Supervision/Assistance - 24 hour    Equipment Recommendations  None recommended by PT    Recommendations for Other Services       Precautions / Restrictions Precautions Precautions: Back;Fall Precaution Booklet Issued: Yes (comment) Required Braces or Orthoses: Spinal Brace Spinal Brace: Lumbar corset;Applied in sitting position Restrictions Weight Bearing Restrictions: No      Mobility  Bed Mobility Overal bed mobility: Needs Assistance Bed Mobility: Sidelying to Sit;Sit to Sidelying;Rolling Rolling: Min guard Sidelying to sit: Min guard     Sit to sidelying: Min guard General bed mobility comments: no assist needed, but pt needs practice for improved technique and function;  Transfers Overall transfer level: Needs assistance   Transfers: Sit to/from Stand Sit to Stand: Min guard            Ambulation/Gait Ambulation/Gait assistance: Min guard Ambulation Distance (Feet): 100 Feet Assistive device: Rolling walker (2 wheeled) Gait Pattern/deviations: Step-through pattern Gait velocity: slower   General Gait Details: generally steady, upright posture with moderate use of the RW  Stairs            Wheelchair Mobility    Modified Rankin (Stroke Patients  Only)       Balance Overall balance assessment: Needs assistance   Sitting balance-Leahy Scale: Fair     Standing balance support: Bilateral upper extremity supported;No upper extremity supported Standing balance-Leahy Scale: Fair Standing balance comment: static standing without UE support                             Pertinent Vitals/Pain Pain Assessment: 0-10 Pain Score: 7  Pain Location: back Pain Intervention(s): Limited activity within patient's tolerance    Home Living Family/patient expects to be discharged to:: Private residence Living Arrangements: Spouse/significant other Available Help at Discharge: Family;Available 24 hours/day;Other (Comment)(wife is taking off for about a week to be with patient) Type of Home: House Home Access: Stairs to enter   [employer] of Steps: 1 Home Layout: One level Home Equipment: Walker - 2 wheels;Shower seat;Grab bars - tub/shower;Cane - single point      Prior Function Level of Independence: Independent;Independent with assistive device(s)               Hand Dominance        Extremity/Trunk Assessment   Upper Extremity Assessment Upper Extremity Assessment: Defer to OT evaluation    Lower Extremity Assessment Lower Extremity Assessment: Overall WFL for tasks assessed       Communication   Communication: No difficulties  Cognition Arousal/Alertness: Awake/alert Behavior During Therapy: WFL for tasks assessed/performed Overall Cognitive Status: Within Functional Limits for tasks assessed  General Comments General comments (skin integrity, edema, etc.): pt instructed in back care/precautions, log roll/transitions to/from sit, lifting restrictions, bracing issues and progression of activity.    Exercises     Assessment/Plan    PT Assessment Patient needs continued PT services  PT Problem List Decreased activity  tolerance;Decreased mobility;Decreased knowledge of use of DME;Decreased knowledge of precautions;Pain       PT Treatment Interventions DME instruction;Gait training;Functional mobility training;Therapeutic activities;Patient/family education    PT Goals (Current goals can be found in the Care Plan section)  Acute Rehab PT Goals Patient Stated Goal: independent at home PT Goal Formulation: With patient Time For Goal Achievement: 12/15/17 Potential to Achieve Goals: Good    Frequency Min 5X/week   Barriers to discharge        Co-evaluation               AM-PAC PT "6 Clicks" Daily Activity  Outcome Measure Difficulty turning over in bed (including adjusting bedclothes, sheets and blankets)?: A Lot Difficulty moving from lying on back to sitting on the side of the bed? : A Lot Difficulty sitting down on and standing up from a chair with arms (e.g., wheelchair, bedside commode, etc,.)?: A Little Help needed moving to and from a bed to chair (including a wheelchair)?: A Little Help needed walking in hospital room?: A Little Help needed climbing 3-5 steps with a railing? : A Little 6 Click Score: 16    End of Session   Activity Tolerance: Patient tolerated treatment well Patient left: in chair;with call bell/phone within reach;with family/visitor present Nurse Communication: Mobility status PT Visit Diagnosis: Other abnormalities of gait and mobility (R26.89);Pain Pain - part of body: (back)    Time: 1610-9604 PT Time Calculation (min) (ACUTE ONLY): 33 min   Charges:   PT Evaluation $PT Eval Moderate Complexity: 1 Mod     PT G Codes:        01-02-2018  [place] Bing, PT [phone]  (pager)  Eliseo Gum Mckinley Olheiser 02-Jan-2018, 2:32 PM

## 2017-12-08 NOTE — Progress Notes (Signed)
Patient c/o change in vision. He saw "objects on the wall sliding down". His neuro check is WNL, VS are WNL. Notified Dr Conchita Paris and medications and chart are reviewed. Stat Labs are ordered. Will cont to monitor.

## 2017-12-08 NOTE — Progress Notes (Signed)
Patient ID: Richard Sandoval, male   DOB: 09-May-1946, 72 y.o.   MRN: [id number] Vital signs are stable Clinically patient appears to be doing fairly well Motor function appears to be stable Mobilization needs a little bit of work but hopefully patient will be able to be discharged home States he will need mattress for his hospital bed at home

## 2017-12-08 NOTE — Evaluation (Signed)
Occupational Therapy Evaluation Patient Details Name: Richard Sandoval MRN: [id number] DOB: 27-Dec-1945 Today's Date: 12/08/2017    History of Present Illness pt is a 72 y/o male with pmh significant for OSA, HTN, h/o lumbar surgery, admitted with bil leg pain and weakness, s/p bil lami's, decompression and PLIF with fixation and autograft at L2-L3.   Clinical Impression   Patient is s/p PLIF with fixation and autograft L2-3 surgery resulting in functional limitations due to the deficits listed below (see OT problem list). Pt currently requires max (A) for all LB adls and cues for safety with bed mobility. Pt educated on proper fix of back.  Patient will benefit from skilled OT acutely to increase independence and safety with ADLS to allow discharge home.     Follow Up Recommendations  No OT follow up    Equipment Recommendations  Other (comment)(hospital bed mattress needs to be serviced)    Recommendations for Other Services       Precautions / Restrictions Precautions Precautions: Back;Fall Precaution Booklet Issued: Yes (comment) Required Braces or Orthoses: Spinal Brace Spinal Brace: Lumbar corset;Applied in sitting position Restrictions Weight Bearing Restrictions: No      Mobility Bed Mobility Overal bed mobility: Needs Assistance Bed Mobility: Sidelying to Sit;Sit to Sidelying;Rolling Rolling: Min guard Sidelying to sit: Min guard     Sit to sidelying: Min guard General bed mobility comments: no assist needed, but pt needs practice for improved technique and function;  Transfers Overall transfer level: Needs assistance   Transfers: Sit to/from Stand Sit to Stand: Min guard         General transfer comment: cues for hand placemetn    Balance Overall balance assessment: Needs assistance   Sitting balance-Leahy Scale: Fair     Standing balance support: Bilateral upper extremity supported;No upper extremity supported Standing balance-Leahy Scale:  Fair Standing balance comment: static standing without UE support                           ADL either performed or assessed with clinical judgement   ADL Overall ADL's : Needs assistance/impaired Eating/Feeding: Independent   Grooming: Supervision/safety Grooming Details (indicate cue type and reason): cues for upright posture at sink and attempting to bend. pt requires UE support on sink to maintain upright posture Upper Body Bathing: Moderate assistance   Lower Body Bathing: Maximal assistance   Upper Body Dressing : Moderate assistance Upper Body Dressing Details (indicate cue type and reason): Pt with extender to brace and educated on wear schedule/ upright Lower Body Dressing: Maximal assistance Lower Body Dressing Details (indicate cue type and reason): pt has reacher at home but needs education on reacher for LB dressing Toilet Transfer: Counsellor - Clothing Manipulation Details (indicate cue type and reason): pt uses toilet aide at baseline     Functional mobility during ADLs: Rolling walker;Min guard General ADL Comments: pt educated on back precautions. Ot using teach back by having patient problem solve a LB dressing task. pt states I just put my pants on the floor and pull them up. pt statse "oh thats bending i cant do that"   Back handout provided and reviewed adls in detail. Pt educated on: clothing between brace, never sleep in brace, set an alarm at night for medication, avoid sitting for long periods of time, correct bed positioning for sleeping, correct sequence for bed mobility, avoiding lifting more than 5 pounds and never wash directly  over incision.    Vision         Perception     Praxis      Pertinent Vitals/Pain Pain Assessment: 0-10 Pain Score: 7  Pain Location: back Pain Descriptors / Indicators: Operative site guarding Pain Intervention(s): Monitored during session;Premedicated before session;Repositioned      Hand Dominance Right   Extremity/Trunk Assessment Upper Extremity Assessment Upper Extremity Assessment: Overall WFL for tasks assessed   Lower Extremity Assessment Lower Extremity Assessment: Defer to PT evaluation   Cervical / Trunk Assessment Cervical / Trunk Assessment: Other exceptions(s/p surg with other surgeries in hx)   Communication Communication Communication: No difficulties   Cognition Arousal/Alertness: Awake/alert Behavior During Therapy: WFL for tasks assessed/performed Overall Cognitive Status: Within Functional Limits for tasks assessed                                     General Comments  pt instructed in back care/precautions, log roll/transitions to/from sit, lifting restrictions, bracing issues and progression of activity.    Exercises     Shoulder Instructions      Home Living Family/patient expects to be discharged to:: Private residence Living Arrangements: Spouse/significant other Available Help at Discharge: Family;Available 24 hours/day;Other (Comment)(wife is taking off for about a week to be with patient) Type of Home: House Home Access: Stairs to enter [employer] of Steps: 1   Home Layout: One level     Bathroom Shower/Tub: Chief Strategy Officer: Standard     Home Equipment: Other (comment)(toilet tongs)   Additional Comments: pt reports pushing up from sink off toilet, pt has extended family to (A) if needed.       Prior Functioning/Environment Level of Independence: Independent;Independent with assistive device(s)                 OT Problem List: Decreased strength;Decreased activity tolerance;Impaired balance (sitting and/or standing);Decreased safety awareness;Decreased knowledge of use of DME or AE;Decreased knowledge of precautions;Obesity;Pain      OT Treatment/Interventions: Self-care/ADL training;Therapeutic exercise;Energy conservation;DME and/or AE  instruction;Therapeutic activities;Patient/family education;Balance training    OT Goals(Current goals can be found in the care plan section) Acute Rehab OT Goals Patient Stated Goal: independent at home OT Goal Formulation: With patient Time For Goal Achievement: 12/22/17 Potential to Achieve Goals: Good  OT Frequency: Min 2X/week   Barriers to D/C:            Co-evaluation PT/OT/SLP Co-Evaluation/Treatment: Yes Reason for Co-Treatment: To address functional/ADL transfers;For patient/therapist safety PT goals addressed during session: Mobility/safety with mobility OT goals addressed during session: ADL's and self-care;Proper use of Adaptive equipment and DME;Strengthening/ROM      AM-PAC PT "6 Clicks" Daily Activity     Outcome Measure Help from another person eating meals?: None Help from another person taking care of personal grooming?: A Little Help from another person toileting, which includes using toliet, bedpan, or urinal?: A Little Help from another person bathing (including washing, rinsing, drying)?: A Lot Help from another person to put on and taking off regular upper body clothing?: A Little Help from another person to put on and taking off regular lower body clothing?: A Lot 6 Click Score: 17   End of Session Equipment Utilized During Treatment: Rolling walker;Back brace Nurse Communication: Mobility status;Precautions  Activity Tolerance: Patient tolerated treatment well Patient left: in chair;with call bell/phone within reach;with family/visitor present  OT Visit Diagnosis: Unsteadiness on  feet (R26.81)                Time: 1310-1340 OT Time Calculation (min): 30 min Charges:  OT General Charges $OT Visit: 1 Visit OT Evaluation $OT Eval Moderate Complexity: 1 Mod G-Codes:      Mateo Flow   OTR/L Pager: [phone] Office: [phone] .   Boone Master B 12/08/2017, 2:55 PM

## 2017-12-09 LAB — CBC
HEMATOCRIT: 38.1 % — AB (ref 39.0–52.0)
HEMOGLOBIN: 11.9 g/dL — AB (ref 13.0–17.0)
MCH: 28.1 pg (ref 26.0–34.0)
MCHC: 31.2 g/dL (ref 30.0–36.0)
MCV: 89.9 fL (ref 78.0–100.0)
Platelets: 150 10*3/uL (ref 150–400)
RBC: 4.24 MIL/uL (ref 4.22–5.81)
RDW: 13.5 % (ref 11.5–15.5)
WBC: 10.9 10*3/uL — ABNORMAL HIGH (ref 4.0–10.5)

## 2017-12-09 LAB — BASIC METABOLIC PANEL
Anion gap: 8 (ref 5–15)
BUN: 14 mg/dL (ref 6–20)
CHLORIDE: 102 mmol/L (ref 101–111)
CO2: 26 mmol/L (ref 22–32)
Calcium: 8.3 mg/dL — ABNORMAL LOW (ref 8.9–10.3)
Creatinine, Ser: 1.26 mg/dL — ABNORMAL HIGH (ref 0.61–1.24)
GFR calc Af Amer: >60 mL/min (ref >60)
GFR calc non Af Amer: 56 mL/min — ABNORMAL LOW (ref >60)
GLUCOSE: 114 mg/dL — AB (ref 65–99)
POTASSIUM: 3.9 mmol/L (ref 3.5–5.1)
Sodium: 136 mmol/L (ref 135–145)

## 2017-12-09 MED FILL — Heparin Sodium (Porcine) Inj 1000 Unit/ML: INTRAMUSCULAR | Qty: 30 | Status: AC

## 2017-12-09 MED FILL — Thrombin For Soln 20000 Unit: CUTANEOUS | Qty: 1 | Status: AC

## 2017-12-09 MED FILL — Sodium Chloride IV Soln 0.9%: INTRAVENOUS | Qty: 1000 | Status: AC

## 2017-12-09 MED FILL — Sodium Chloride Irrigation Soln 0.9%: Qty: 3000 | Status: AC

## 2017-12-09 NOTE — Progress Notes (Signed)
Patient denies any vision issues. He says that his vision is "back to normal".

## 2017-12-09 NOTE — Progress Notes (Signed)
Occupational Therapy Treatment Patient Details Name: Richard Sandoval MRN: [id number] DOB: March 30, 1946 Today's Date: 12/09/2017    History of present illness pt is a 72 y/o male with pmh significant for OSA, HTN, h/o lumbar surgery, admitted with bil leg pain and weakness, s/p bil lami's, decompression and PLIF with fixation and autograft at L2-L3.   OT comments  PLEASE see visual changes listed below. Pt very groggy but question result of medication at this time due to nausea. Pt progressing well with therapy and no weakness noted at this time for UB.    Follow Up Recommendations  No OT follow up    Equipment Recommendations  Other (comment)    Recommendations for Other Services      Precautions / Restrictions Precautions Precautions: Back;Fall Required Braces or Orthoses: Spinal Brace Spinal Brace: Lumbar corset;Applied in sitting position       Mobility Bed Mobility               General bed mobility comments: in chair on arrival  Transfers Overall transfer level: Needs assistance   Transfers: Sit to/from Stand Sit to Stand: Min guard              Balance                                           ADL either performed or assessed with clinical judgement   ADL Overall ADL's : Needs assistance/impaired Eating/Feeding: Independent                   Lower Body Dressing: Min guard;With adaptive equipment;Sit to/from stand Lower Body Dressing Details (indicate cue type and reason): educated on reacher and use with return demo using pillow case.        Toileting - Clothing Manipulation Details (indicate cue type and reason): has white curved toilet aide at home for 15 years. pt educated on toilet tongs because patient asking what is now available to toileting       General ADL Comments: pt observed in hall with PT just prior to session. pt reports visual changes to therapist.      Vision   Additional Comments: pt reports  dizziness with visual tracking task. pt with objects sliding downward and reports its in slow motion. pt only reports visual changes in peripheral visual fields. pt does not see in central vision. pt reports "your face is fine but your hair is moving" pt self reports vertigo with eye occlusion in the shower and when praying at church   Perception     Praxis      Cognition Arousal/Alertness: Awake/alert Behavior During Therapy: [facility] for tasks assessed/performed Overall Cognitive Status: Impaired/Different from baseline                                 General Comments: pt very groggy this session compared to evaluation. pt reports nauesate this AM> pt also notes visual changes see below. pt reports I know its not real but its there        Exercises     Shoulder Instructions       General Comments      Pertinent Vitals/ Pain       Pain Assessment: (nauseated is biggest complaint/ and fatigue)  Home Living  Prior Functioning/Environment              Frequency  Min 2X/week        Progress Toward Goals  OT Goals(current goals can now be found in the care plan section)  Progress towards OT goals: Progressing toward goals  Acute Rehab OT Goals Patient Stated Goal: independent at home OT Goal Formulation: With patient Time For Goal Achievement: 12/22/17 Potential to Achieve Goals: Good ADL Goals Pt Will Perform Lower Body Dressing: with modified independence;with adaptive equipment;sit to/from stand Additional ADL Goal #1: pt will complete bed mobiltiy supervision level as precursor to adls.  Plan Discharge plan remains appropriate    Co-evaluation                 AM-PAC PT "6 Clicks" Daily Activity     Outcome Measure   Help from another person eating meals?: None Help from another person taking care of personal grooming?: A Little Help from another person toileting, which includes  using toliet, bedpan, or urinal?: A Little Help from another person bathing (including washing, rinsing, drying)?: A Little Help from another person to put on and taking off regular upper body clothing?: A Little Help from another person to put on and taking off regular lower body clothing?: A Little 6 Click Score: 19    End of Session Equipment Utilized During Treatment: Back brace  OT Visit Diagnosis: Unsteadiness on feet (R26.81)   Activity Tolerance Patient tolerated treatment well   Patient Left in chair;with call bell/phone within reach   Nurse Communication Mobility status;Precautions        Time: 2130-8657 OT Time Calculation (min): 12 min  Charges: OT General Charges $OT Visit: 1 Visit OT Treatments $Self Care/Home Management : 8-22 mins   Mateo Flow   OTR/L Pager: [phone] Office: [phone] .    Boone Master B 12/09/2017, 12:34 PM

## 2017-12-09 NOTE — Progress Notes (Signed)
Patient ID: Richard Sandoval, male   DOB: 08-22-1945, 72 y.o.   MRN: [id number] Patient appears to be doing reasonably well considering the nature of the surgery he does have significant mobility issues secondary to his size.  Review of PT and OT notes suggest that he is doing fairly well.  I discussed discharge plans with patient and he notes that his wife has significant limitations secondary to heart disease and he may need some additional help above and beyond what she is capable of at this time I will ask rehabilitation medicine to see the patient see if he is an appropriate candidate for inpatient rehabilitation to improve his independence prior to discharge.  Otherwise there may need to be consideration of a SNF for short-term rehabilitative needs.

## 2017-12-09 NOTE — Progress Notes (Signed)
Patient ambulated 25 feet in the hallway with assist of front wheel walker and assist X1. Patient c/o SOB with ambulation. Patient 02 sat upper 90's on RA while ambulating. Patient returned to bed and sat on side of bed for 15 min. Patient tolerated fair. Spouse remained at the bedside.

## 2017-12-09 NOTE — Progress Notes (Signed)
Physical Therapy Treatment Patient Details Name: Richard Sandoval MRN: [id number] DOB: [birth year] Today's Date: 12/09/2017    History of Present Illness pt is a 72 y/o male with pmh significant for OSA, HTN, h/o lumbar surgery, admitted with bil leg pain and weakness, s/p bil lami's, decompression and PLIF with fixation and autograft at L2-L3.    PT Comments    Pt feeling poorly this am, but agreed to get up and ambulate in the halls.  Emphasis on reinforcing education, and transfer/gait safety, stability and stamina.   Follow Up Recommendations  No PT follow up;Supervision/Assistance - 24 hour     Equipment Recommendations  None recommended by PT    Recommendations for Other Services       Precautions / Restrictions Precautions Precautions: Back;Fall Required Braces or Orthoses: Spinal Brace Spinal Brace: Lumbar corset;Applied in sitting position    Mobility  Bed Mobility               General bed mobility comments: pt stayed up OOB  Transfers Overall transfer level: Needs assistance   Transfers: Sit to/from Stand Sit to Stand: Min guard         General transfer comment: cues for hand placemetn  Ambulation/Gait Ambulation/Gait assistance: Min guard Ambulation Distance (Feet): 160 Feet Assistive device: Rolling walker (2 wheeled) Gait Pattern/deviations: Step-through pattern Gait velocity: slower   General Gait Details: cues for postural check and staying appropriately close to the RW   Stairs             Wheelchair Mobility    Modified Rankin (Stroke Patients Only)       Balance Overall balance assessment: Needs assistance   Sitting balance-Leahy Scale: Fair       Standing balance-Leahy Scale: Fair Standing balance comment: static standing without UE support                            Cognition Arousal/Alertness: Awake/alert Behavior During Therapy: WFL for tasks assessed/performed Overall Cognitive Status: Within  Functional Limits for tasks assessed                                 General Comments: pt very groggy this session compared to evaluation. pt reports nauesate this AM> pt also notes visual changes see below. pt reports I know its not real but its there      Exercises      General Comments General comments (skin integrity, edema, etc.): pt reinforced in back care/precautions, log roll/transitions to/from sit, lifting restrictions, bracing issues and progression of activity.      Pertinent Vitals/Pain Pain Assessment: Faces Faces Pain Scale: Hurts a little bit Pain Location: back Pain Descriptors / Indicators: Operative site guarding    Home Living                      Prior Function            PT Goals (current goals can now be found in the care plan section) Acute Rehab PT Goals Patient Stated Goal: independent at home PT Goal Formulation: With patient Time For Goal Achievement: 12/15/17 Potential to Achieve Goals: Good Progress towards PT goals: Progressing toward goals    Frequency    Min 5X/week      PT Plan Current plan remains appropriate    Co-evaluation  AM-PAC PT "6 Clicks" Daily Activity  Outcome Measure  Difficulty turning over in bed (including adjusting bedclothes, sheets and blankets)?: A Lot Difficulty moving from lying on back to sitting on the side of the bed? : A Lot Difficulty sitting down on and standing up from a chair with arms (e.g., wheelchair, bedside commode, etc,.)?: A Little Help needed moving to and from a bed to chair (including a wheelchair)?: A Little Help needed walking in hospital room?: A Little Help needed climbing 3-5 steps with a railing? : A Little 6 Click Score: 16    End of Session   Activity Tolerance: Patient tolerated treatment well Patient left: in chair;with call bell/phone within reach;with family/visitor present Nurse Communication: Mobility status PT Visit Diagnosis:  Other abnormalities of gait and mobility (R26.89);Pain Pain - part of body: (back)     Time: 1610-9604 PT Time Calculation (min) (ACUTE ONLY): 17 min  Charges:  $Gait Training: 8-22 mins                    G Codes:       01-05-2018  [place] Bing, PT [phone]  (pager)   Eliseo Gum Joannah Gitlin January 05, 2018, 3:47 PM

## 2017-12-10 NOTE — Progress Notes (Signed)
Physical Therapy Treatment Patient Details Name: Richard Sandoval MRN: [id number] DOB: [birth year] Today's Date: 12/10/2017    History of Present Illness pt is a 72 y/o male with pmh significant for OSA, HTN, h/o lumbar surgery, admitted with bil leg pain and weakness, s/p bil lami's, decompression and PLIF with fixation and autograft at L2-L3.    PT Comments    Progressing well.  Emphasis on education, bed mobility, donning the brace, safety with transfers, gait stability/stamina and stair training.    Follow Up Recommendations  No PT follow up;Supervision/Assistance - 24 hour     Equipment Recommendations  None recommended by PT    Recommendations for Other Services       Precautions / Restrictions Precautions Precautions: Back;Fall Required Braces or Orthoses: Spinal Brace Spinal Brace: Lumbar corset;Applied in sitting position    Mobility  Bed Mobility Overal bed mobility: Needs Assistance Bed Mobility: Rolling;Sidelying to Sit;Sit to Sidelying Rolling: Supervision Sidelying to sit: Supervision     Sit to sidelying: Min guard    Transfers Overall transfer level: Needs assistance Equipment used: Rolling walker (2 wheeled) Transfers: Sit to/from Stand Sit to Stand: Min guard         General transfer comment: cues for hand placement  Ambulation/Gait Ambulation/Gait assistance: Supervision Ambulation Distance (Feet): 80 Feet(then additional 120 feet) Assistive device: Rolling walker (2 wheeled) Gait Pattern/deviations: Step-through pattern Gait velocity: slower Gait velocity interpretation: 1.31 - 2.62 ft/sec, indicative of limited community ambulator General Gait Details: still needing postural cues, but otherwise generally steady and mildly dyspneic.   Stairs Stairs: Yes Stairs assistance: Min guard Stair Management: One rail Right;Step to pattern;Forwards Number of Stairs: 4 General stair comments: safe with the rail   Wheelchair Mobility     Modified Rankin (Stroke Patients Only)       Balance Overall balance assessment: Needs assistance Sitting-balance support: No upper extremity supported Sitting balance-Leahy Scale: Fair     Standing balance support: Bilateral upper extremity supported;No upper extremity supported Standing balance-Leahy Scale: Fair Standing balance comment: static standing without UE support                            Cognition Arousal/Alertness: Awake/alert Behavior During Therapy: WFL for tasks assessed/performed Overall Cognitive Status: Within Functional Limits for tasks assessed                                        Exercises      General Comments General comments (skin integrity, edema, etc.): Reinforced application of the brace, lifting restrictions, progression of activity and practiced transitioning sit to/from sidelying.      Pertinent Vitals/Pain Pain Assessment: Faces Faces Pain Scale: Hurts a little bit Pain Location: back Pain Descriptors / Indicators: Operative site guarding    Home Living                      Prior Function            PT Goals (current goals can now be found in the care plan section) Acute Rehab PT Goals Patient Stated Goal: independent at home PT Goal Formulation: With patient Time For Goal Achievement: 12/15/17 Potential to Achieve Goals: Good Progress towards PT goals: Progressing toward goals    Frequency    Min 5X/week      PT Plan Current plan remains appropriate  Co-evaluation              AM-PAC PT "6 Clicks" Daily Activity  Outcome Measure  Difficulty turning over in bed (including adjusting bedclothes, sheets and blankets)?: A Little Difficulty moving from lying on back to sitting on the side of the bed? : A Little Difficulty sitting down on and standing up from a chair with arms (e.g., wheelchair, bedside commode, etc,.)?: A Little Help needed moving to and from a bed to chair  (including a wheelchair)?: A Little Help needed walking in hospital room?: A Little Help needed climbing 3-5 steps with a railing? : A Little 6 Click Score: 18    End of Session Equipment Utilized During Treatment: Back brace Activity Tolerance: Patient tolerated treatment well Patient left: in chair;with call bell/phone within reach;with family/visitor present Nurse Communication: Mobility status PT Visit Diagnosis: Other abnormalities of gait and mobility (R26.89);Pain Pain - part of body: (back)     Time: 1610-9604 PT Time Calculation (min) (ACUTE ONLY): 37 min  Charges:  $Gait Training: 8-22 mins $Therapeutic Activity: 8-22 mins                    G Codes:       12-22-2017  [place] Bing, PT [phone]  (pager)   Eliseo Gum Hector Taft 12-22-17, 3:17 PM

## 2017-12-10 NOTE — Progress Notes (Signed)
Name: Richard Sandoval MRN: [id number] DOB: [birth year]    ADMISSION DATE:  12/07/2017 CONSULTATION DATE:  12/07/2017  REFERRING MD :  Dr. Danielle Dess  CHIEF COMPLAINT:  Back pain  HISTORY OF PRESENT ILLNESS:   72 year old male with PMH of morbid obesity, never smoker, OSA on CPAP q HS with O2 2L, GERD, HTN, PH and right heart failure in the setting of DVT and PE in 2017 on Eliquis (which has been held for 3 days prior to surgery), and lumbar stenosis with prior surgery 12 years prior admitted to Neurosurgery on 5/13 for elective L2-L3 posterior laminectomy and fusion.  Patient with progressively worsening back pain and leg weakness over the last 6 months with recent MRI demonstrating severe adjacent level disease at L2-L3. Underwent complex bilateral laminectomies L2-L3 decompression of L2 and L3 nerve with roots on 5/13.  EBL ~853ml; received albumin and 340 ml cell saver intraoperatively.  Patient extubated postoperatively.  PCCM consulted to assist with further pulmonary management given his morbid obesity and OSA.    SUBJECTIVE:  Pt reports improvement in vision.  Has had issues with objects appearing as if they are "sliding down the wall".  Today, feels as if it has improved.  Walking, feels better out of bed.  Passing gas.  Eating well.   VITAL SIGNS: Temp:  [97.5 F (36.4 C)-99.6 F (37.6 C)] 98.1 F (36.7 C) (05/16 0743) Pulse Rate:  [67-97] 72 (05/16 0800) Resp:  [10-22] 13 (05/16 0800) BP: (123-169)/(66-101) 160/101 (05/16 0800) SpO2:  [92 %-99 %] 99 % (05/16 0800)  PHYSICAL EXAMINATION: General: adult male in NAD sitting up in chair  HEENT: MM pink/moist, no jvd, good dentition  Neuro: AAOx4, speech clear, MAE CV: s1s2 rrr, no m/r/g PULM: even/non-labored, lungs bilaterally clear JY:NWGN, non-tender, bsx4 active, back brace in place  Extremities: warm/dry, trace LE edema, changes consistent with chronic venous stasis Skin: no rashes or lesions  Recent Labs  Lab 12/08/17 0208  12/08/17 2305 12/09/17 0239  NA 136 138 136  K 4.9 4.1 3.9  CL 104 103 102  CO2 BUN CREATININE 0.95 1.30* 1.26*  GLUCOSE 126* 104* 114*   Recent Labs  Lab 12/08/17 0208 12/08/17 2305 12/09/17 0239  HGB 13.2 12.3* 11.9*  HCT 40.7 39.2 38.1*  WBC 11.3* 10.9* 10.9*  PLT 168 144* 150   No results found.   SIGNIFICANT EVENTS  5/13  Admit / OR  Brief Patient Summary:  72 year old male with PMH of morbid obesity, OSA on CPAP q HS with O2 2L, and prior DVT/ PE in 2017 on Eliquis (which has been held for 3 days prior to surgery) admitted to Neurosurgery on 5/13 for elective L2-L3 posterior laminectomy and fusion.  ASSESSMENT / PLAN:  OSA on CPAP and O2 2L q HS At risk for airway insufficiency given post-operative setting, pain, and hx of OSA/ Morbid obesity  Hx of DVT and PE 2017 on Eliquis (held for 3 days prior to surgery) P:  Pulmonary hygiene -IS, mobilize CPAP QHS & PRN daytime sleep > pt is very compliant with use  Wean O2 off for sats >92% Continue Eliquis   Severe spinal spondylosis with stenosis L2-3 s/p decompression and fusion P:  Per NSGY  PT / OT efforts  Pain control per primary   Chronic RV failure HTN - BP consistently ranging 120's-160's systolic - PH noted after PE; repeat TTE in 05/2017 with LVEF 55-60%, normal diastolic  function, moderately dilated and reduced RV systolic function; RVSP now normal ( may be underestimated given RV dysfunction) P:  Monitor volume status  PRN lasix for swelling  Consider antihypertensive agent > discussed follow up with PCP once discharged.  ? Contribution of pain to BP's currently.   Defer timing of restart of ASA to NSGY    DVT prophylaxis: SCDs only, see above SUP: not indicated/ Protonix for GERD Diet: Diet as tolerated  Activity: per NSGY Disposition : SDU.  OK from pulmonary standpoint for discharge when cleared from NSGY.    PCCM will be available PRN.  Please call back if new needs  arise.   Canary Brim, NP-C [facility] Pgr: [phone] or if no answer [phone] 12/10/2017, 10:01 AM

## 2017-12-10 NOTE — Progress Notes (Signed)
Postop day 2.  Patient's pain reasonably well controlled.  Mobility poor secondary to morbid obesity.  Patient is afebrile.  Vital signs are stable.  He is awake and alert.  Oriented and appropriate.  Motor and sensory function extremities normal.  Wound clean and dry.  Chest and abdomen benign.  Progressing slowly.  I understand he is not a rehab candidate.  Plan to continue efforts at increasing mobility and hopefully will be ready for discharge over the next few days.

## 2017-12-10 NOTE — Progress Notes (Signed)
Physical medicine rehab consult requested and chart reviewed.  Patient progressing nicely since recent back surgery 12/07/2017 with recommendations per physical therapy as well as occupational therapy for discharge to home and not a candidate for inpatient rehab services at this time.  Hold on formal rehab consult with recommendations of discharge to home

## 2017-12-10 NOTE — Progress Notes (Signed)
Pt already wearing CPAP when RT checked. No further assistance needed at this time. RT will continue to monitor.

## 2017-12-11 LAB — BASIC METABOLIC PANEL
ANION GAP: 7 (ref 5–15)
BUN: 14 mg/dL (ref 6–20)
CALCIUM: 8.6 mg/dL — AB (ref 8.9–10.3)
CHLORIDE: 103 mmol/L (ref 101–111)
CO2: 28 mmol/L (ref 22–32)
Creatinine, Ser: 1.01 mg/dL (ref 0.61–1.24)
GFR calc non Af Amer: >60 mL/min (ref >60)
Glucose, Bld: 117 mg/dL — ABNORMAL HIGH (ref 65–99)
Potassium: 3.7 mmol/L (ref 3.5–5.1)
SODIUM: 138 mmol/L (ref 135–145)

## 2017-12-11 LAB — CBC
HEMATOCRIT: 39 % (ref 39.0–52.0)
HEMOGLOBIN: 12.5 g/dL — AB (ref 13.0–17.0)
MCH: 28.8 pg (ref 26.0–34.0)
MCHC: 32.1 g/dL (ref 30.0–36.0)
MCV: 89.9 fL (ref 78.0–100.0)
Platelets: 189 10*3/uL (ref 150–400)
RBC: 4.34 MIL/uL (ref 4.22–5.81)
RDW: 13.3 % (ref 11.5–15.5)
WBC: 9.7 10*3/uL (ref 4.0–10.5)

## 2017-12-11 MED ORDER — OXYCODONE-ACETAMINOPHEN 5-325 MG PO TABS: 1.0000 | ORAL_TABLET | ORAL | 0 refills | Status: DC | PRN

## 2017-12-11 MED ORDER — METHOCARBAMOL 500 MG PO TABS: 500.0000 mg | ORAL_TABLET | Freq: Four times a day (QID) | ORAL | 1 refills | Status: DC | PRN

## 2017-12-11 NOTE — Progress Notes (Addendum)
Physical Therapy Treatment Patient Details Name: Richard Sandoval MRN: [id number] DOB: 11/23/1945 Today's Date: 12/11/2017    History of Present Illness pt is a 72 y/o male with pmh significant for OSA, HTN, h/o lumbar surgery, admitted with bil leg pain and weakness, s/p bil lami's, decompression and PLIF with fixation and autograft at L2-L3.    PT Comments    All education completed.  Worked on gait stability with cane and RW.  Emphasis on improving speed and stride and general gait stamina.   Follow Up Recommendations  No PT follow up;Supervision/Assistance - 24 hour     Equipment Recommendations  Rolling walker with 5" wheels;Other (comment)(bariatric RW)    Recommendations for Other Services       Precautions / Restrictions Precautions Precautions: Back;Fall Precaution Comments: reviewed back precautions in detail again for adls. Pt only able to recall 1out 3 ( bending). pt with no recall of education from 12/09/17 Required Braces or Orthoses: Spinal Brace Spinal Brace: Lumbar corset;Applied in sitting position    Mobility  Bed Mobility               General bed mobility comments: oob to chair on arrival  Transfers Overall transfer level: Needs assistance Equipment used: Rolling walker (2 wheeled) Transfers: Sit to/from Stand Sit to Stand: Min guard;Supervision         General transfer comment: good transfer safety  Ambulation/Gait Ambulation/Gait assistance: Supervision Ambulation Distance (Feet): 200 Feet Assistive device: Rolling walker (2 wheeled) Gait Pattern/deviations: Step-through pattern Gait velocity: slower Gait velocity interpretation: 1.31 - 2.62 ft/sec, indicative of limited community ambulator General Gait Details: occasional postural cues.  Pt able to ambulate approx. 30 feet with cane only.  Sequencing with cane was safe, but pt relating increased pain from less stability.  So, pt finished the distance with the RW.  Reinforced to pt to  challenge himself with increased speeds and longer stride lengths   Stairs   Stairs assistance: Min guard         Wheelchair Mobility    Modified Rankin (Stroke Patients Only)       Balance Overall balance assessment: Needs assistance Sitting-balance support: No upper extremity supported Sitting balance-Leahy Scale: Fair Sitting balance - Comments: `   Standing balance support: Bilateral upper extremity supported;No upper extremity supported Standing balance-Leahy Scale: Fair Standing balance comment: static standing without UE support                            Cognition Arousal/Alertness: Awake/alert Behavior During Therapy: WFL for tasks assessed/performed Overall Cognitive Status: Within Functional Limits for tasks assessed                                        Exercises      General Comments General comments (skin integrity, edema, etc.): Reinforced post d/c progression of activity and general "do's and don't's)      Pertinent Vitals/Pain Pain Assessment: Faces Faces Pain Scale: Hurts even more Pain Location: back Pain Descriptors / Indicators: Operative site guarding Pain Intervention(s): Monitored during session    Home Living                      Prior Function            PT Goals (current goals can now be found in the care plan section)  Acute Rehab PT Goals Patient Stated Goal: independent at home PT Goal Formulation: With patient Time For Goal Achievement: 12/15/17 Potential to Achieve Goals: Good Progress towards PT goals: Progressing toward goals    Frequency    Min 5X/week      PT Plan Current plan remains appropriate    Co-evaluation              AM-PAC PT "6 Clicks" Daily Activity  Outcome Measure  Difficulty turning over in bed (including adjusting bedclothes, sheets and blankets)?: A Little Difficulty moving from lying on back to sitting on the side of the bed? : A  Little Difficulty sitting down on and standing up from a chair with arms (e.g., wheelchair, bedside commode, etc,.)?: A Little Help needed moving to and from a bed to chair (including a wheelchair)?: A Little Help needed walking in hospital room?: A Little Help needed climbing 3-5 steps with a railing? : A Little 6 Click Score: 18    End of Session Equipment Utilized During Treatment: Back brace Activity Tolerance: Patient tolerated treatment well Patient left: in chair;with call bell/phone within reach;with family/visitor present Nurse Communication: Mobility status PT Visit Diagnosis: Other abnormalities of gait and mobility (R26.89);Pain Pain - part of body: (back)     Time: 0454-0981 PT Time Calculation (min) (ACUTE ONLY): 17 min  Charges:  $Gait Training: 8-22 mins                    G Codes:       2018/01/07  Richard Sandoval, PT [phone]  (pager)   Eliseo Gum Maelynn Moroney 07-Jan-2018, 11:38 AM

## 2017-12-11 NOTE — Discharge Summary (Signed)
  Physician Discharge Summary  Patient ID: Richard Sandoval MRN: [id number] DOB/AGE: [birth year] 72 y.o. Estimated body mass index is 61.41 kg/m as calculated from the following:   Height as of this encounter:  (1.778 m).   Weight as of this encounter: 194.1 kg (428 lb).   Admit date: 12/07/2017 Discharge date: 12/11/2017  Admission Diagnoses:lumbar spinal stenosis  Discharge Diagnoses: same Active Problems:   Lumbar stenosis with neurogenic claudication   Discharged Condition: good  Hospital Course: patient is admitted the hospital underwent decompression fusion on 513 postoperative admitted very well recovered in the floor on the floor was angling and voiding spontaneously working with physical therapy and by postop day 4 he was stable for discharge home scheduled follow-up with Dr. Danielle Dess one 2 weeks.  Consults: Significant Diagnostic Studies: Treatments:L2-3 decompression fusion Discharge Exam: Blood pressure 140/74, pulse 81, temperature 98.5 F (36.9 C), temperature source Oral, resp. rate 15, height  (1.778 m), weight (!) 194.1 kg (428 lb), SpO2 97 %. Strength out of 5 wound clean dry and intact  Disposition: home  Discharge Instructions    Incentive spirometry RT   Complete by:  As directed    Walker rolling   Complete by:  As directed      Allergies as of 12/11/2017      Reactions   Penicillins Itching, Other (See Comments)   Has patient had a PCN reaction causing immediate rash, facial/tongue/throat swelling, SOB or lightheadedness with hypotension: No Has patient had a PCN reaction causing severe rash involving mucus membranes or skin necrosis: No Has patient had a PCN reaction that required hospitalization No Has patient had a PCN reaction occurring within the last 10 years: No If all of the above answers are "NO", then may proceed with Cephalosporin use.      Medication List    TAKE these medications   acetaminophen 325 MG tablet Commonly known  as:  TYLENOL Take 325 mg by mouth daily as needed for moderate pain or headache.   apixaban 5 MG Tabs tablet Commonly known as:  ELIQUIS Take 5 mg by mouth 2 (two) times daily.   aspirin EC 81 MG tablet Take 81 mg by mouth daily.   furosemide 20 MG tablet Commonly known as:  LASIX Take 20 mg by mouth as needed (SWELLING).   methocarbamol 500 MG tablet Commonly known as:  ROBAXIN Take 1 tablet (500 mg total) by mouth every 6 (six) hours as needed for muscle spasms.   oxyCODONE-acetaminophen 5-325 MG tablet Commonly known as:  PERCOCET/ROXICET Take 1-2 tablets by mouth every 3 (three) hours as needed for severe pain.   OXYGEN 2lpm  with CPAP  [facility]   pantoprazole 40 MG tablet Commonly known as:  PROTONIX Take 1 tablet (40 mg total) daily by mouth.   simethicone 125 MG chewable tablet Commonly known as:  MYLICON Chew 125 mg by mouth every 6 (six) hours as needed for flatulence.            Durable Medical Equipment  (From admission, onward)        Start     Ordered   12/11/17 1204  For home use only DME Walker wide  Once    Question:  Patient needs a walker to treat with the following condition  Answer:  Spondylosis   12/11/17 1205       Signed: Genita Nilsson P 12/11/2017, 1:38 PM

## 2017-12-11 NOTE — Care Management Important Message (Signed)
Important Message  Patient Details  Name: Richard Sandoval MRN: [id number] Date of Birth: 08-Jul-1946   Medicare Important Message Given:  Yes    [name] 12/11/2017, 2:00 PM

## 2017-12-11 NOTE — Evaluation (Signed)
Occupational Therapy Evaluation Patient Details Name: Richard Sandoval MRN: [id number] DOB: [birth year] Today's Date: 12/11/2017    History of Present Illness pt is a 72 y/o male with pmh significant for OSA, HTN, h/o lumbar surgery, admitted with bil leg pain and weakness, s/p bil lami's, decompression and PLIF with fixation and autograft at L2-L3.   Clinical Impression   Pt is at adequate level from OT stand point to d/c at this time. Pt with all LB education and pt plans to purchase AE at later date. Pt without further questions. PT to practice bed mobility with patient later this AM      Follow Up Recommendations  No OT follow up    Equipment Recommendations  Other (comment)    Recommendations for Other Services       Precautions / Restrictions Precautions Precautions: Back;Fall Precaution Comments: reviewed back precautions in detail again for adls. Pt only able to recall 1out 3 ( bending). pt with no recall of education from 12/09/17 Required Braces or Orthoses: Spinal Brace Spinal Brace: Lumbar corset;Applied in standing position      Mobility Bed Mobility               General bed mobility comments: oob to chair on arrival  Transfers Overall transfer level: Needs assistance Equipment used: Rolling walker (2 wheeled) Transfers: Sit to/from Stand Sit to Stand: Supervision              Balance                                           ADL either performed or assessed with clinical judgement   ADL Overall ADL's : Needs assistance/impaired Eating/Feeding: Independent   Grooming: Independent Grooming Details (indicate cue type and reason): sitting this session       Lower Body Bathing Details (indicate cue type and reason): will require AE for LB  Upper Body Dressing : Modified independent Upper Body Dressing Details (indicate cue type and reason): pt don doff brace  Lower Body Dressing: Supervision/safety;Sit to/from stand Lower  Body Dressing Details (indicate cue type and reason): educated on use of reacher, sock aide and simulated on of pants, actual don of sock Toilet Transfer: Radiographer, therapeutic Details (indicate cue type and reason): requires use of hands on arm rest, simulated with sit<>stand from chair       Tub/Shower Transfer Details (indicate cue type and reason): educated on use of warm water to prevent falls   General ADL Comments: pt asking questions about LB and toileting. Pt educated on toilet aide and AE. pt interested in toilet tongs and plans to purchase at later date     Vision         Perception     Praxis      Pertinent Vitals/Pain Pain Assessment: Faces Faces Pain Scale: Hurts a little bit Pain Location: stomach Pain Descriptors / Indicators: Discomfort Pain Intervention(s): Monitored during session;Premedicated before session;Repositioned     Hand Dominance     Extremity/Trunk Assessment             Communication     Cognition Arousal/Alertness: Awake/alert Behavior During Therapy: WFL for tasks assessed/performed Overall Cognitive Status: Within Functional Limits for tasks assessed  General Comments  wound dry at this time    Exercises     Shoulder Instructions      Home Living                                          Prior Functioning/Environment                   OT Problem List:        OT Treatment/Interventions:      OT Goals(Current goals can be found in the care plan section) Acute Rehab OT Goals Patient Stated Goal: independent at home OT Goal Formulation: With patient Time For Goal Achievement: 12/22/17 Potential to Achieve Goals: Good ADL Goals Pt Will Perform Lower Body Dressing: with modified independence;with adaptive equipment;sit to/from stand Additional ADL Goal #1: pt will complete bed mobiltiy supervision level as precursor to adls.  OT  Frequency: Min 2X/week   Barriers to D/C:            Co-evaluation              AM-PAC PT "6 Clicks" Daily Activity     Outcome Measure Help from another person eating meals?: None Help from another person taking care of personal grooming?: A Little Help from another person toileting, which includes using toliet, bedpan, or urinal?: A Little Help from another person bathing (including washing, rinsing, drying)?: A Little Help from another person to put on and taking off regular upper body clothing?: A Little Help from another person to put on and taking off regular lower body clothing?: A Little 6 Click Score: 19   End of Session Equipment Utilized During Treatment: Back brace;Rolling walker Nurse Communication: Mobility status;Precautions  Activity Tolerance: Patient tolerated treatment well Patient left: in chair;with call bell/phone within reach  OT Visit Diagnosis: Unsteadiness on feet (R26.81)                Time: 1610-9604 OT Time Calculation (min): 26 min Charges:  OT General Charges $OT Visit: 1 Visit OT Treatments $Self Care/Home Management : 8-22 mins G-Codes:      Mateo Flow   OTR/L Pager: [phone] Office: [phone] .   Boone Master B 12/11/2017, 9:20 AM

## 2017-12-11 NOTE — Progress Notes (Signed)
Progress note for Evaluation of Need for Hospital Bed   Pt suffers from severe spinal stenosis s/p bilateral laminectomies and decompression; he requires [facility] to be elevated at least 30 degrees or more and requires frequent and immediate changes in body position for pain reduction, which cannot be achieved with a normal bed.  Quintella Baton, RN, BSN  Trauma/Neuro ICU Case Manager [phone]

## 2017-12-16 ENCOUNTER — Other Ambulatory Visit: Payer: Self-pay

## 2017-12-16 DIAGNOSIS — Z7982 Long term (current) use of aspirin: Secondary | ICD-10-CM | POA: Insufficient documentation

## 2017-12-16 DIAGNOSIS — M79605 Pain in left leg: Secondary | ICD-10-CM | POA: Insufficient documentation

## 2017-12-16 DIAGNOSIS — I10 Essential (primary) hypertension: Secondary | ICD-10-CM | POA: Insufficient documentation

## 2017-12-16 DIAGNOSIS — M79604 Pain in right leg: Secondary | ICD-10-CM | POA: Insufficient documentation

## 2017-12-16 DIAGNOSIS — Z79899 Other long term (current) drug therapy: Secondary | ICD-10-CM | POA: Diagnosis not present

## 2017-12-17 ENCOUNTER — Encounter (HOSPITAL_COMMUNITY): Payer: Self-pay | Admitting: Emergency Medicine

## 2017-12-17 ENCOUNTER — Emergency Department (HOSPITAL_COMMUNITY)
Admission: EM | Admit: 2017-12-17 | Discharge: 2017-12-17 | Disposition: A | Discharge status: Home / Self Care | Payer: 59 | Attending: Emergency Medicine | Admitting: Emergency Medicine

## 2017-12-17 ENCOUNTER — Emergency Department (HOSPITAL_BASED_OUTPATIENT_CLINIC_OR_DEPARTMENT_OTHER)
Admit: 2017-12-17 | Discharge: 2017-12-17 | Disposition: A | Discharge status: Home / Self Care | Payer: 59 | Attending: Emergency Medicine | Admitting: Emergency Medicine

## 2017-12-17 DIAGNOSIS — M79609 Pain in unspecified limb: Secondary | ICD-10-CM | POA: Diagnosis not present

## 2017-12-17 DIAGNOSIS — M79605 Pain in left leg: Secondary | ICD-10-CM

## 2017-12-17 DIAGNOSIS — M7989 Other specified soft tissue disorders: Secondary | ICD-10-CM | POA: Diagnosis not present

## 2017-12-17 DIAGNOSIS — M79604 Pain in right leg: Secondary | ICD-10-CM

## 2017-12-17 LAB — CBC WITH DIFFERENTIAL/PLATELET
ABS IMMATURE GRANULOCYTES: 0.1 10*3/uL (ref 0.0–0.1)
Basophils Absolute: 0.1 10*3/uL (ref 0.0–0.1)
Basophils Relative: 1 %
Eosinophils Absolute: 0.2 10*3/uL (ref 0.0–0.7)
Eosinophils Relative: 2 %
HEMATOCRIT: 41.3 % (ref 39.0–52.0)
HEMOGLOBIN: 12.9 g/dL — AB (ref 13.0–17.0)
IMMATURE GRANULOCYTES: 1 %
LYMPHS ABS: 2.1 10*3/uL (ref 0.7–4.0)
LYMPHS PCT: 28 %
MCH: 28.1 pg (ref 26.0–34.0)
MCHC: 31.2 g/dL (ref 30.0–36.0)
MCV: 90 fL (ref 78.0–100.0)
Monocytes Absolute: 0.7 10*3/uL (ref 0.1–1.0)
Monocytes Relative: 9 %
NEUTROS ABS: 4.4 10*3/uL (ref 1.7–7.7)
Neutrophils Relative %: 59 %
Platelets: 289 10*3/uL (ref 150–400)
RBC: 4.59 MIL/uL (ref 4.22–5.81)
RDW: 13.5 % (ref 11.5–15.5)
WBC: 7.5 10*3/uL (ref 4.0–10.5)

## 2017-12-17 LAB — COMPREHENSIVE METABOLIC PANEL
ALBUMIN: 3.1 g/dL — AB (ref 3.5–5.0)
ALK PHOS: 56 U/L (ref 38–126)
ALT: 26 U/L (ref 17–63)
ANION GAP: 9 (ref 5–15)
AST: 27 U/L (ref 15–41)
BILIRUBIN TOTAL: 1.3 mg/dL — AB (ref 0.3–1.2)
BUN: 13 mg/dL (ref 6–20)
CO2: 27 mmol/L (ref 22–32)
CREATININE: 0.99 mg/dL (ref 0.61–1.24)
Calcium: 8.9 mg/dL (ref 8.9–10.3)
Chloride: 104 mmol/L (ref 101–111)
GFR calc Af Amer: >60 mL/min (ref >60)
GFR calc non Af Amer: >60 mL/min (ref >60)
GLUCOSE: 114 mg/dL — AB (ref 65–99)
Potassium: 3.9 mmol/L (ref 3.5–5.1)
SODIUM: 140 mmol/L (ref 135–145)
Total Protein: 6.8 g/dL (ref 6.5–8.1)

## 2017-12-17 MED ORDER — APIXABAN 5 MG PO TABS
5.0000 mg | ORAL_TABLET | Freq: Once | ORAL | Status: AC
  Administered 2017-12-17: 5 mg via ORAL
  Filled 2017-12-17: qty 1

## 2017-12-17 MED ORDER — OXYCODONE-ACETAMINOPHEN 5-325 MG PO TABS
2.0000 | ORAL_TABLET | Freq: Once | ORAL | Status: AC
  Administered 2017-12-17: 2 via ORAL
  Filled 2017-12-17: qty 2

## 2017-12-17 MED ORDER — ASPIRIN 81 MG PO CHEW
81.0000 mg | CHEWABLE_TABLET | Freq: Once | ORAL | Status: AC
  Administered 2017-12-17: 81 mg via ORAL
  Filled 2017-12-17: qty 1

## 2017-12-17 NOTE — ED Provider Notes (Signed)
[facility] EMERGENCY DEPARTMENT Provider Note   CSN: [id number] Arrival date & time: 12/16/17  2355     History   Chief Complaint Chief Complaint  Patient presents with  . Leg Pain    HPI Richard Sandoval is a 72 y.o. male.  HPI Patient is a 72 year old male with history as below, notable for prior DVT and PE chronically anticoagulated on Eliquis, who also had a lumbar fusion 1 week ago and was off his Eliquis for a total of 4 days, who presents with bilateral leg pain, left greater than right.  He was discharged from the hospital on 12/13/2017.  His leg pain began yesterday.  He describes it as a burning pain.  It is moderate.  It is located primarily on the anterior aspect of both thighs, however worse on the left.  He states this is similar to the pain he had with a previous blood clot in his legs.  He endorses shortness of breath which she states is chronic and unchanged from baseline.  He denies any chest pain.  He has had some issues with pain control from his back surgery as he did not have his pain medications over the weekend.  However, now, his back pain is well controlled.  He denies any weakness or numbness in his legs.  His chronic left leg swelling which he does not believe is any worse than his baseline.  He denies any infectious symptoms.  Past Medical History:  Diagnosis Date  . DVT (deep venous thrombosis) (HCC)    L leg in 2009  . Dyspnea   . GERD (gastroesophageal reflux disease)   . Hypertension    enlarged heart  . Morbid obesity (HCC)   . OSA (obstructive sleep apnea)    on CPAP since 1995  . Pancreatitis   . Pulmonary hypertension (HCC) 06/04/2017  . Right heart failure (HCC) 08/27/2017    Patient Active Problem List   Diagnosis Date Noted  . Lumbar stenosis with neurogenic claudication 12/07/2017  . Right heart failure (HCC) 08/27/2017  . Pulmonary hypertension (HCC) 06/04/2017  . GERD (gastroesophageal reflux disease) 02/21/2016  .  Bilateral pulmonary embolism (HCC) 02/20/2016  . Hypokalemia 01/25/2014  . Pancreatitis, gallstone 01/25/2014  . Morbid obesity (HCC)   . Pancreatitis 01/05/2014  . Abnormal transaminases 01/05/2014  . Dyspnea 12/15/2013  . Upper airway cough syndrome 09/27/2013  . Cellulitis and abscess 05/10/2012  . Morbid (severe) obesity due to excess calories (HCC) 09/24/2006  . DEPRESSIVE DISORDER, NOS 09/24/2006  . BACK PAIN, LOW 09/24/2006  . OSA (obstructive sleep apnea) 09/24/2006    Past Surgical History:  Procedure Laterality Date  . BACK SURGERY  04/09/1999  . cervical laminectomy and fusion    . CHOLECYSTECTOMY N/A 01/25/2014   Procedure: LAPAROSCOPIC CHOLECYSTECTOMY WITH INTRAOPERATIVE CHOLANGIOGRAM;  Surgeon: Velora Heckler, MD;  Location: [facility];  Service: General;  Laterality: N/A;  . COLONOSCOPY    . EUS N/A 01/24/2014   Procedure: UPPER ENDOSCOPIC ULTRASOUND (EUS) RADIAL;  Surgeon: Willis Modena, MD;  Location: [facility];  Service: Endoscopy;  Laterality: N/A;  . KNEE SURGERY  1998  . left inguinal hernia    . LUMBAR LAMINECTOMY    . TRIGGER FINGER RELEASE  08/04/2012   Procedure: MINOR RELEASE TRIGGER FINGER/A-1 PULLEY;  Surgeon: Nicki Reaper, MD;  Location: [facility];  Service: Orthopedics;  Laterality: Right;        Home Medications    Prior to Admission medications  Medication Sig Start Date End Date Taking? Authorizing Provider  apixaban (ELIQUIS) 5 MG TABS tablet Take 5 mg by mouth 2 (two) times daily.   Yes [provider]  aspirin EC 81 MG tablet Take 81 mg by mouth daily.   Yes [provider]  docusate sodium (COLACE) 100 MG capsule Take 100 mg by mouth daily as needed for mild constipation.   Yes [provider]  furosemide (LASIX) 20 MG tablet Take 20 mg by mouth daily as needed for fluid (SWELLING).    Yes [provider]  methocarbamol (ROBAXIN) 500 MG tablet Take 1 tablet (500 mg total) by mouth every 6  (six) hours as needed for muscle spasms. 12/11/17  Yes Donalee Citrin, MD  oxyCODONE-acetaminophen (PERCOCET/ROXICET) 5-325 MG tablet Take 1-2 tablets by mouth every 3 (three) hours as needed for severe pain. 12/11/17  Yes Donalee Citrin, MD  OXYGEN 2lpm  with CPAP  [facility]   Yes [provider]  pantoprazole (PROTONIX) 40 MG tablet Take 1 tablet (40 mg total) daily by mouth. 06/04/17  Yes Chilton Si, MD  polyethylene glycol [facility] / Ethelene Hal) packet Take 17 g by mouth daily as needed for mild constipation.   Yes [provider]  simethicone (MYLICON) 125 MG chewable tablet Chew 125 mg by mouth every 6 (six) hours as needed for flatulence.    [provider]    Family History Family History  Problem Relation Age of Onset  . Breast cancer Mother   . Gout Father   . Hypertension Father   . Diabetes Sister   . Heart disease Brother     Social History Social History   Tobacco Use  . Smoking status: Never Smoker  . Smokeless tobacco: Never Used  Substance Use Topics  . Alcohol use: No  . Drug use: No     Allergies   Penicillins   Review of Systems Review of Systems  Constitutional: Negative for chills and fever.  HENT: Negative for ear pain and sore throat.   Eyes: Negative for pain and visual disturbance.  Respiratory: Negative for cough and shortness of breath.   Cardiovascular: Positive for leg swelling. Negative for chest pain and palpitations.  Gastrointestinal: Negative for abdominal pain and vomiting.  Genitourinary: Negative for dysuria and hematuria.  Musculoskeletal: Positive for back pain and myalgias. Negative for arthralgias.  Skin: Negative for color change and rash.  Neurological: Negative for seizures and syncope.  All other systems reviewed and are negative.    Physical Exam Updated Vital Signs BP 132/61   Pulse 78   Temp 98.6 F (37 C) (Oral)   Resp 18   SpO2 98%   Physical Exam  Constitutional: He is oriented to person,  place, and time. He appears well-developed and well-nourished.  HENT:  Head: Normocephalic and atraumatic.  Eyes: Conjunctivae are normal.  Neck: Neck supple.  Cardiovascular: Normal rate and regular rhythm.  No murmur heard. Pulmonary/Chest: Effort normal and breath sounds normal. No respiratory distress.  Abdominal: Soft. There is no tenderness.  Musculoskeletal: He exhibits tenderness. He exhibits no edema.  There is tenderness to palpation of bilateral anterior distal thighs.  No overlying skin changes to suggest cellulitis.  The left calf is markedly swollen relative to the right, however patient reports this is chronic.  There is chronic skin discoloration to the distal left calf as well.  Neurological: He is alert and oriented to person, place, and time.  Strength and sensation are symmetric in his bilateral  lower extremities.  Hip flexion is slightly weakened at 4 out of 5.  Plantarflexion and dorsiflexion are 5 out of 5 bilaterally.  Skin: Skin is warm and dry.  Psychiatric: He has a normal mood and affect.  Nursing note and vitals reviewed.    ED Treatments / Results  Labs (all labs ordered are listed, but only abnormal results are displayed) Labs Reviewed  CBC WITH DIFFERENTIAL/PLATELET - Abnormal; Notable for the following components:      Result Value   Hemoglobin 12.9 (*)    All other components within normal limits  COMPREHENSIVE METABOLIC PANEL - Abnormal; Notable for the following components:   Glucose, Bld 114 (*)    Albumin 3.1 (*)    Total Bilirubin 1.3 (*)    All other components within normal limits    EKG None  Radiology No results found.  Procedures Procedures (including critical care time)  Medications Ordered in ED Medications  oxyCODONE-acetaminophen (PERCOCET/ROXICET) 5-325 MG per tablet 2 tablet (2 tablets Oral Given 12/17/17 0845)  apixaban (ELIQUIS) tablet 5 mg (5 mg Oral Given 12/17/17 0846)  aspirin chewable tablet 81 mg (81 mg Oral Given  12/17/17 0845)     Initial Impression / Assessment and Plan / ED Course  I have reviewed the triage vital signs and the nursing notes.  Pertinent labs & imaging results that were available during my care of the patient were reviewed by me and considered in my medical decision making (see chart for details).    Patient is a 72 year old male with history as above, notable for recent lumbar fusion, who presents with bilateral leg pain, left greater than right.  He does have a history of prior DVT and PE and is chronically anticoagulated on Eliquis.  He was however off the Eliquis for 4 days around his recent surgery.  His pain here is primarily along the anterior portion of his distal thighs bilaterally.  DVT study was performed bilaterally and shows no evidence of acute DVT.  Given the nature and location of his pain, I suspect this is more muscle soreness and may be related to his recent deconditioning.  I would suggest that he continue his anticoagulation for his history of thromboembolic disease.  Does not have any findings on exam to suggest cellulitis or other infectious etiology.  His back pain is now well controlled despite being without his pain medications over the weekend.  He appears to be stable for discharge at this time.  Return precautions discussed in detail.  Final Clinical Impressions(s) / ED Diagnoses   Final diagnoses:  Bilateral leg pain    ED Discharge Orders    None       Lennette Bihari, MD 12/17/17 1305    Loren Racer, MD 12/18/17 [phone]

## 2017-12-17 NOTE — Discharge Instructions (Signed)
-   We performed ultrasound of your legs today looking for DVT (blood clot) - There was no evidence of any NEW blood clot, however you should continue your Eliquis given your history - I suspect your pain is likely coming from muscle soreness given your recent surgery and deconditioning - Please schedule follow up appointment with your primary physician as soon as possible and continue following up with your surgeon as directed - Please return to the Emergency Department if you have any new or worsening symptoms

## 2017-12-17 NOTE — Progress Notes (Signed)
Preliminary results by tech -No evidence of acute deep vein thrombosis in both legs. There is evidence of a small amount of chronic thrombus in the popliteal vein. The left calf peroneal veins were not clearly visualized, cannot exclude DVT at the is level. Marilynne Halsted, BS, RDMS, RVT

## 2017-12-17 NOTE — ED Notes (Signed)
Pt stable, ambulatory, states understanding of discharge instructions 

## 2017-12-17 NOTE — ED Triage Notes (Signed)
Patient here with left lower leg burning that started today.  He states that he had lower back fusion on 5/13 and released from here on Friday.  He states that the last time he had this feeling in his legs, he had a blood clot.  No warm to area, but there is quite a bit of swelling in left calf.

## 2017-12-20 ENCOUNTER — Other Ambulatory Visit: Payer: Self-pay | Admitting: Cardiovascular Disease

## 2018-03-22 ENCOUNTER — Ambulatory Visit
Admission: RE | Admit: 2018-03-22 | Discharge: 2018-03-22 | Disposition: A | Discharge status: Home / Self Care | Payer: Medicare Other | Source: Ambulatory Visit | Attending: Family Medicine | Admitting: Family Medicine

## 2018-03-22 ENCOUNTER — Other Ambulatory Visit: Payer: Self-pay | Admitting: Family Medicine

## 2018-03-22 DIAGNOSIS — M79672 Pain in left foot: Secondary | ICD-10-CM

## 2018-03-26 ENCOUNTER — Ambulatory Visit (INDEPENDENT_AMBULATORY_CARE_PROVIDER_SITE_OTHER): Payer: Medicare Other | Admitting: Podiatry

## 2018-03-26 ENCOUNTER — Encounter: Payer: Self-pay | Admitting: Podiatry

## 2018-03-26 VITALS — Ht 70.5 in | Wt >= 6400 oz

## 2018-03-26 DIAGNOSIS — Q820 Hereditary lymphedema: Secondary | ICD-10-CM

## 2018-03-26 DIAGNOSIS — M7672 Peroneal tendinitis, left leg: Secondary | ICD-10-CM | POA: Diagnosis not present

## 2018-03-26 MED ORDER — TRIAMCINOLONE ACETONIDE 10 MG/ML IJ SUSP
10.0000 mg | Freq: Once | INTRAMUSCULAR | Status: AC
  Administered 2018-03-26: 10 mg

## 2018-03-26 NOTE — Progress Notes (Signed)
Subjective:   Patient ID: Richard Sandoval, male   DOB: 72 y.o.   MRN: [id number]   HPI Patient presents with pain on the outside of the left foot who has extreme obesity and history of just having back surgery.  He cannot reach his foot but it is very painful when palpated and it feels like he is just walking on an inflamed area.  Patient does not smoke and does weigh 430 pounds and has lymphedema also   Review of Systems  All other systems reviewed and are negative.       Objective:  Physical Exam  Constitutional: He appears well-developed and well-nourished.  Cardiovascular: Intact distal pulses.  Pulmonary/Chest: Effort normal.  Musculoskeletal: Normal range of motion.  Neurological: He is alert.  Skin: Skin is warm.  Nursing note and vitals reviewed.   Neurovascular status was found to be intact muscle strength was reduced range of motion reduced subtalar midtarsal joint with exquisite discomfort lateral side fifth metatarsal base with inflammation fluid around the area.  Patient is noted to have obesity which is also complicating factor to this condition     Assessment:  Strong probability for peroneal tendinitis left     Plan:  Injected the peroneal insertion left 3 mg Kenalog problem Xylocaine after reviewing x-rays and patient will be seen back for us to recheck again as symptoms indicate  X-rays were negative for signs of fracture or bone injury left

## 2018-03-26 NOTE — Progress Notes (Signed)
   Subjective:    Patient ID: Richard HawkLarry Flannery, male    DOB: [birth year], 72 y.o.   MRN: [id number]  HPI    Review of Systems  All other systems reviewed and are negative.      Objective:   Physical Exam        Assessment & Plan:

## 2018-03-28 DIAGNOSIS — R52 Pain, unspecified: Secondary | ICD-10-CM | POA: Diagnosis not present

## 2018-04-02 ENCOUNTER — Encounter: Payer: Self-pay | Admitting: Podiatry

## 2018-04-02 ENCOUNTER — Ambulatory Visit (INDEPENDENT_AMBULATORY_CARE_PROVIDER_SITE_OTHER): Payer: Medicare Other | Admitting: Podiatry

## 2018-04-02 DIAGNOSIS — M7672 Peroneal tendinitis, left leg: Secondary | ICD-10-CM | POA: Diagnosis not present

## 2018-04-02 MED ORDER — TRIAMCINOLONE ACETONIDE 10 MG/ML IJ SUSP
10.0000 mg | Freq: Once | INTRAMUSCULAR | Status: AC
  Administered 2018-04-02: 10 mg

## 2018-04-04 NOTE — Progress Notes (Signed)
Subjective:   Patient ID: Richard Sandoval, male   DOB: 72 y.o.   MRN: [id number]   HPI Patient states the injection did not seem to solve my problem on my left foot and is not exactly in the same area but sore.  Patient's obesity is complicating factor   ROS      Objective:  Physical Exam  Neurovascular status unchanged with patient's pain where he did the original injection good but it is more distal plantar foot where it was previously     Assessment:  Tendinitis left which is present in a different spot with a plantar capsular inflammation noted     Plan:  I did a sterile prep of this new area and injected 3 mg Kenalog 5 mg Xylocaine and advised that due to his obesity and just having had back surgery is difficult for [place] to really help him in other ways and we may have to consider physical therapy.  Patient will be seen back to reevaluate

## 2018-04-20 ENCOUNTER — Encounter: Payer: Self-pay | Admitting: Cardiovascular Disease

## 2018-04-20 ENCOUNTER — Ambulatory Visit (INDEPENDENT_AMBULATORY_CARE_PROVIDER_SITE_OTHER): Payer: 59 | Admitting: Cardiovascular Disease

## 2018-04-20 VITALS — BP 107/64 | HR 78 | Ht 70.0 in | Wt >= 6400 oz

## 2018-04-20 DIAGNOSIS — I50812 Chronic right heart failure: Secondary | ICD-10-CM | POA: Diagnosis not present

## 2018-04-20 DIAGNOSIS — Z5181 Encounter for therapeutic drug level monitoring: Secondary | ICD-10-CM

## 2018-04-20 DIAGNOSIS — I272 Pulmonary hypertension, unspecified: Secondary | ICD-10-CM | POA: Diagnosis not present

## 2018-04-20 DIAGNOSIS — R0602 Shortness of breath: Secondary | ICD-10-CM | POA: Diagnosis not present

## 2018-04-20 DIAGNOSIS — R600 Localized edema: Secondary | ICD-10-CM | POA: Diagnosis not present

## 2018-04-20 MED ORDER — FUROSEMIDE 40 MG PO TABS: 40.0000 mg | ORAL_TABLET | Freq: Two times a day (BID) | ORAL | 5 refills | Status: DC

## 2018-04-20 NOTE — Progress Notes (Signed)
Cardiology Office Note   Date:  04/20/2018   ID:  Richard Sandoval, DOB [birth year], MRN [id number]  PCP:  Richard Sandoval, Cynthia, MD  Cardiologist:   Richard Siiffany Covington, MD   Chief Complaint  Patient presents with  . New Patient (Initial Visit)     History of Present Illness: Richard Sandoval is a 72 y.o. male with prior pulmonary embolism, moderate pulmonary hypertension, hypertension, morbid obesity, OSA on CPAP here for follow up.  He was initially seen 05/2017 for the evaluation of chronic dyspnea on exertion.  [name] reports many years of shortness of breath.  This dates back to 2000 after he had surgery on his back.  Since that time he continues to have pain and has not been able to be very physically active.  He has noted progressive dyspnea on exertion.  In July 2017 he had a left lower extremity DVT and pulmonary embolism.  [name] had an echo 02/21/16 that revealed LVEF 50-55% with grade 1 diastolic dysfunction.  PASP was 52 mmHg.  He thinks that the shortness of breath has gotten worse since that time.  He still has lower extremity edema that is worse in the left than the right.  This started after developing cellulitis in that leg.  He has OSA and uses CPAP regularly for the last 25 years.  He never smoked but has been exposed to secondhand smoke.  Since his last appointment [name] was referred for an echo 06/11/17 that revealed LVEF 55-60% with normal diastolic function.  Since his last appointment [name] had lumbar decompression surgery 11/2017.  This has helped his back pain.  However he has been struggling with left foot pain and had to have an injection.  He is also had significant lower extremity edema.  Dr. Cliffton AstersWhite increased his Lasix from 20 mg to 40 mg 2 weeks ago.  This has helped somewhat but he continues to have massive edema.  He has chronic shortness of breath with exertion but no orthopnea or PND.  He is unable to lie in bed because of his back pain.  He is mostly been  sleeping in a chair with his legs hanging down.  He has not really been watching his salt intake.  He has no chest pain or pressure.   Past Medical History:  Diagnosis Date  . DVT (deep venous thrombosis) (HCC)    L leg in 2009  . Dyspnea   . GERD (gastroesophageal reflux disease)   . Hypertension    enlarged heart  . Morbid obesity (HCC)   . OSA (obstructive sleep apnea)    on CPAP since 1995  . Pancreatitis   . Pulmonary hypertension (HCC) 06/04/2017  . Right heart failure (HCC) 08/27/2017    Past Surgical History:  Procedure Laterality Date  . BACK SURGERY  04/09/1999  . cervical laminectomy and fusion    . CHOLECYSTECTOMY N/A 01/25/2014   Procedure: LAPAROSCOPIC CHOLECYSTECTOMY WITH INTRAOPERATIVE CHOLANGIOGRAM;  Surgeon: [name] M Gerkin, MD;  Location: [facility];  Service: General;  Laterality: N/A;  . COLONOSCOPY    . EUS N/A 01/24/2014   Procedure: UPPER ENDOSCOPIC ULTRASOUND (EUS) RADIAL;  Surgeon: Willis ModenaWilliam [name], MD;  Location: [facility];  Service: Endoscopy;  Laterality: N/A;  . KNEE SURGERY  1998  . left inguinal hernia    . LUMBAR LAMINECTOMY    . TRIGGER FINGER RELEASE  08/04/2012   Procedure: MINOR RELEASE TRIGGER FINGER/A-1 PULLEY;  Surgeon: Nicki ReaperGary R Kuzma, MD;  Location: [facility];  Service: Orthopedics;  Laterality: Right;     Current Outpatient Medications  Medication Sig Dispense Refill  . apixaban (ELIQUIS) 5 MG TABS tablet Take 5 mg by mouth 2 (two) times daily.    . cefUROXime (CEFTIN) 500 MG tablet TAKE 1 TABLET BY MOUTH EVERY 12 HOURS FOR 10 DAYS  0  . colchicine 0.6 MG tablet TAKE 1 TABLET BY MOUTH ONCE DAILY AS NEEDED FOR PAIN  2  . docusate sodium (COLACE) 100 MG capsule Take 100 mg by mouth daily as needed for mild constipation.    [name] doxycycline (VIBRA-TABS) 100 MG tablet     . furosemide (LASIX) 40 MG tablet Take 1 tablet (40 mg total) by mouth 2 (two) times daily. 60 tablet 5  . gabapentin (NEURONTIN) 100 MG capsule     . methocarbamol  (ROBAXIN) 500 MG tablet Take 1 tablet (500 mg total) by mouth every 6 (six) hours as needed for muscle spasms. 40 tablet 1  . oxyCODONE-acetaminophen (PERCOCET/ROXICET) 5-325 MG tablet Take 1-2 tablets by mouth every 3 (three) hours as needed for severe pain. 40 tablet 0  . OXYGEN 2lpm  with CPAP  [facility]    . pantoprazole (PROTONIX) 40 MG tablet Take 1 tablet (40 mg total) daily by mouth. 30 tablet 11  . polyethylene glycol (MIRALAX / GLYCOLAX) packet Take 17 g by mouth daily as needed for mild constipation.    . simethicone (MYLICON) 125 MG chewable tablet Chew 125 mg by mouth every 6 (six) hours as needed for flatulence.     No current facility-administered medications for this visit.     Allergies:   Penicillins    Social History:  The patient  reports that he has never smoked. He has never used smokeless tobacco. He reports that he does not drink alcohol or use drugs.   Family History:  The patient's family history includes Breast cancer in his mother; Diabetes in his sister; Gout in his father; Heart disease in his brother; Hypertension in his father.    ROS:  Please see the history of present illness.   Otherwise, review of systems are positive for none.   All other systems are reviewed and negative.    PHYSICAL EXAM: VS:  BP 107/64   Pulse 78   Ht 5\' 10"  (1.778 m)   Wt (!) 423 lb 6.4 oz (192.1 kg)   SpO2 99%   BMI 60.75 kg/m  , BMI Body mass index is 60.75 kg/m. GENERAL:  Well appearing.  No acute distress. HEENT: Pupils equal round and reactive, fundi not visualized, oral mucosa unremarkable NECK:  No jugular venous distention, waveform within normal limits, carotid upstroke brisk and symmetric, no bruits, no thyromegaly LYMPHATICS:  No cervical adenopathy LUNGS:  Clear to auscultation bilaterally HEART:  RRR.  PMI not displaced or sustained,S1 and S2 within normal limits, no S3, no S4, no clicks, no rubs, no murmurs ABD:  Cetral adiposity and panus. Positive bowel sounds  normal in frequency in pitch, no bruits, no rebound, no guarding, no midline pulsatile mass, no hepatomegaly, no splenomegaly EXT:  2 plus pulses throughout, tense, 2+ pitting edema to the knees bilaterally, no cyanosis no clubbing SKIN:  No rashes no nodules NEURO:  Cranial nerves II through XII grossly intact, motor grossly intact throughout PSYCH:  Cognitively intact, oriented to person place and time   EKG:  EKG is ordered today. The ekg ordered 06/04/17 demonstrates sinus rhythm.  Rate 68 bpm.  PACs.  LAFB.  04/20/18: Sinus rhythm.  Rate 78 bpm.  PVC.  Low voltage precordial leads.  LAFB   Recent Labs: 08/27/2017: Magnesium 2.0 12/17/2017: ALT 26; BUN 13; Creatinine, Ser 0.99; Hemoglobin 12.9; Platelets 289; Potassium 3.9; Sodium 140   02/09/17: WBC 5.1, hemoglobin 15, hematocrit 45.1, platelets 191 Sodium 139, potassium 4.5, BUN 18, creatinine 1.0 AST 23, ALT 15 Total cholesterol 165, triglycerides 47, HDL 37, LDL 119 next TSH 1.77  Lipid Panel    Component Value Date/Time   TRIG 40 01/05/2014 1759      Wt Readings from Last 3 Encounters:  04/20/18 (!) 423 lb 6.4 oz (192.1 kg)  03/26/18 (!) 428 lb (194.1 kg)  12/10/17 (!) 428 lb (194.1 kg)      ASSESSMENT AND PLAN:  # Chest discomfort:  Resolved.  # Shortness of breath: # Morbid obesity:  Mr. Elvis Coil shortness of breath has improved but is still present with exertion.  Pulmonary pressures were better on his repeat echo.  I suspect that this is mostly due to morbid obesity and physical inactivity.  We discussed the importance of exercising at least 150 minutes weekly.    # LE edema: [name] has massive lower extremity edema.  It has improved slightly by increasing his Lasix dose.  We will increase Lasix to 40 mg twice daily.  On days when he is unable to take his morning dose he will take 80 mg in the evenings.  He will have a basic metabolic panel checked in 1 week.  He is already scheduled to see Dr. Cliffton Asters 10/17.   Therefore we will plan to see him in 2 months.  We will also ask his home health nurse who does wound care to do Unna boots.  He is unable to wear compression socks regularly.  # Pulmonary hypertension: Pulmonary pressures were moderately elevated last year in the setting of having a pulmonary embolism.  This improved on repeat but he has some RV failure.  This may lead to underestimating his pulmonary pressure.   # CV Disease Prevention: Stop aspirin.  He is on Eliquis for DVT.   Current medicines are reviewed at length with the patient today.  The patient does not have concerns regarding medicines.  The following changes have been made: Increase lasix to 40mg  bid.  Labs/ tests ordered today include:   Orders Placed This Encounter  Procedures  . Basic metabolic panel  . EKG 12-Lead     Disposition:   FU with Sharlet Notaro C. Duke Salvia, MD, [facility] in 2 months.    Signed, Eilene Voigt C. Duke Salvia, MD, [facility]  04/20/2018 12:34 PM    [facility]

## 2018-04-20 NOTE — Patient Instructions (Addendum)
Medication Instructions:  STOP ASPIRIN   INCREASE YOUR LASIX (FUROSEMIDE) TO 40 MG TWICE A DAY   Labwork: BMET IN 1 WEEK   Testing/Procedures: NONE  Follow-Up: Your physician recommends that you schedule a follow-up appointment in: 2 MONTHS   WILL ARRANGE FOR YOU TO GET UNNA BOOTS   If you need a refill on your cardiac medications before your next appointment, please call your pharmacy.

## 2018-04-22 ENCOUNTER — Telehealth: Payer: Self-pay | Admitting: Cardiovascular Disease

## 2018-04-22 NOTE — Telephone Encounter (Signed)
Spoke to home health nurse-Jill need verbal order for unna boot.  per  Office note 04/20/18   unna boot - Change twice a day. Verbalized understanding.

## 2018-05-03 ENCOUNTER — Ambulatory Visit (INDEPENDENT_AMBULATORY_CARE_PROVIDER_SITE_OTHER): Payer: Medicare Other | Admitting: Podiatry

## 2018-05-03 ENCOUNTER — Ambulatory Visit: Payer: Medicare Other

## 2018-05-03 DIAGNOSIS — M7672 Peroneal tendinitis, left leg: Secondary | ICD-10-CM | POA: Diagnosis not present

## 2018-05-03 DIAGNOSIS — R600 Localized edema: Secondary | ICD-10-CM

## 2018-05-03 DIAGNOSIS — L03119 Cellulitis of unspecified part of limb: Secondary | ICD-10-CM

## 2018-05-03 DIAGNOSIS — L02619 Cutaneous abscess of unspecified foot: Secondary | ICD-10-CM | POA: Diagnosis not present

## 2018-05-03 DIAGNOSIS — M79672 Pain in left foot: Secondary | ICD-10-CM

## 2018-05-03 MED ORDER — CEPHALEXIN 500 MG PO CAPS: 500.0000 mg | ORAL_CAPSULE | Freq: Three times a day (TID) | ORAL | 1 refills | Status: DC

## 2018-05-04 ENCOUNTER — Other Ambulatory Visit: Payer: Self-pay | Admitting: Neurological Surgery

## 2018-05-04 DIAGNOSIS — Z981 Arthrodesis status: Secondary | ICD-10-CM

## 2018-05-04 NOTE — Progress Notes (Signed)
Subjective:   Patient ID: Richard Sandoval, male   DOB: 72 y.o.   MRN: [id number]   HPI Patient states that the pain is more on the bottom outside of the foot and that he has been getting Unna boots which seems to irritate his tissue but is necessary from his family doctor try to reduce the swelling in his legs   ROS      Objective:  Physical Exam  Patient with morbid obesity who has discomfort in his left foot that continues to move around with pain noted more in the plantar aspect and he stated he thinks he stepped on a thumbtack.  Patient did not have any active drainage but there was some warmth in the area but no proximal edema erythema or drainage noted     Assessment:  Possibility for localized infection secondary to trauma even though no portal of entry is noted     Plan:  As precautionary measure I placed him on any antibiotic cephalexin 500 mg 3 times daily and I then discussed the possibility for a oral steroid pack to try to help the inflammation but due to the morbid obesity and other conditions this is always could be a difficult problem to get better

## 2018-05-05 ENCOUNTER — Telehealth: Payer: Self-pay | Admitting: *Deleted

## 2018-05-05 NOTE — Telephone Encounter (Signed)
Patient was advised to get follow up labs one week at [facility] 04/20/18. Spoke with patient and he will have HHN get when she comes to office this week, confirmed he has order for labs.

## 2018-05-06 ENCOUNTER — Other Ambulatory Visit: Payer: 59

## 2018-05-07 NOTE — Telephone Encounter (Signed)
Spoke with patient and he stated he did not have order for labs. Faxed order to [facility] and called to confirm receipt. They will get labs and visit Tuesday

## 2018-05-07 NOTE — Telephone Encounter (Signed)
Follow up    Patient is calling in reference to order for labs. He states that [facility] came out and they could not do the labs because they had no order. Please call to discuss.

## 2018-05-10 ENCOUNTER — Encounter: Payer: Self-pay | Admitting: Podiatry

## 2018-05-10 ENCOUNTER — Ambulatory Visit (INDEPENDENT_AMBULATORY_CARE_PROVIDER_SITE_OTHER): Payer: 59 | Admitting: Podiatry

## 2018-05-10 DIAGNOSIS — L02619 Cutaneous abscess of unspecified foot: Secondary | ICD-10-CM | POA: Diagnosis not present

## 2018-05-10 DIAGNOSIS — M7672 Peroneal tendinitis, left leg: Secondary | ICD-10-CM

## 2018-05-10 DIAGNOSIS — L03119 Cellulitis of unspecified part of limb: Secondary | ICD-10-CM

## 2018-05-10 DIAGNOSIS — R600 Localized edema: Secondary | ICD-10-CM | POA: Diagnosis not present

## 2018-05-10 MED ORDER — SULFAMETHOXAZOLE-TRIMETHOPRIM 800-160 MG PO TABS: 1.0000 | ORAL_TABLET | Freq: Two times a day (BID) | ORAL | 1 refills | Status: DC

## 2018-05-11 NOTE — Progress Notes (Signed)
Subjective:   Patient ID: Richard Sandoval, male   DOB: 72 y.o.   MRN: [id number]   HPI Patient continues to complain of pain in the bottom of his left foot that makes it hard to walk on.  He states that he knows his weight is a contributing factor and is not noted any drainage or other pathology   ROS      Objective:  Physical Exam  Neurovascular status intact with patient having a lot of pain base of fifth metatarsal left with keratotic tissue formation but no drainage formation or other indications of infective process with no increased redness or swelling     Assessment:  Appears to be inflammatory problem with significant obesity is complicating factor     Plan:  H&P discussed condition in the difficulty of this and his failure to respond so far to cortisone injections.  As precautionary measure in case it could be any kind of infective process not apparent I did place him on cephalexin 500 mg 3 times daily instructed on soaks and also placed him into an air fracture walker to try to take all pressure off the bottom of his foot.  Gave him strict instructions of any redness or any systemic signs of infection were to occur he is to go straight to the emergency room and contact us

## 2018-05-13 ENCOUNTER — Telehealth: Payer: Self-pay | Admitting: Podiatry

## 2018-05-13 DIAGNOSIS — M7672 Peroneal tendinitis, left leg: Secondary | ICD-10-CM

## 2018-05-13 DIAGNOSIS — R600 Localized edema: Secondary | ICD-10-CM

## 2018-05-13 NOTE — Addendum Note (Signed)
Addended by: Alphia Kava D on: 05/13/2018 02:58 PM   Modules accepted: Orders

## 2018-05-13 NOTE — Telephone Encounter (Signed)
Pt wants to go ahead and get a MRI at [facility]. Patient is currently in walking boot/pain has increased

## 2018-05-13 NOTE — Telephone Encounter (Signed)
Orders to J. Quintana, RN for pre-cert and faxed to [facility]. 

## 2018-05-13 NOTE — Telephone Encounter (Signed)
I informed pt of Dr. Beverlee Nims MRI order and informed of [facility] [phone].

## 2018-05-18 ENCOUNTER — Ambulatory Visit
Admission: RE | Admit: 2018-05-18 | Discharge: 2018-05-18 | Disposition: A | Discharge status: Home / Self Care | Payer: Medicare Other | Source: Ambulatory Visit | Attending: Podiatry | Admitting: Podiatry

## 2018-05-19 ENCOUNTER — Encounter: Payer: Self-pay | Admitting: Podiatry

## 2018-05-19 ENCOUNTER — Ambulatory Visit (INDEPENDENT_AMBULATORY_CARE_PROVIDER_SITE_OTHER): Payer: 59 | Admitting: Podiatry

## 2018-05-19 ENCOUNTER — Telehealth: Payer: Self-pay | Admitting: Podiatry

## 2018-05-19 DIAGNOSIS — L03119 Cellulitis of unspecified part of limb: Secondary | ICD-10-CM

## 2018-05-19 DIAGNOSIS — M7672 Peroneal tendinitis, left leg: Secondary | ICD-10-CM | POA: Diagnosis not present

## 2018-05-19 DIAGNOSIS — Q820 Hereditary lymphedema: Secondary | ICD-10-CM

## 2018-05-19 DIAGNOSIS — L02619 Cutaneous abscess of unspecified foot: Secondary | ICD-10-CM

## 2018-05-19 MED ORDER — PREDNISONE 10 MG PO TABS: ORAL_TABLET | ORAL | 0 refills | Status: DC

## 2018-05-19 NOTE — Telephone Encounter (Signed)
Pt called and is asking if he could get a call asap with the results from his mri he had done yesterday.Pt stated he is in a lot of pain.  He also asked about a medication refill and he was going to take it to the pharmacy and see if they will refill it but never gave me the medication name.

## 2018-05-19 NOTE — Telephone Encounter (Signed)
I called pt, he states he is having a lot of pain in the left foot bottom, feels like he is walking on fluid. I offered pt an appt today at 2:45pm due to his abscess dx from last visit. Pt agreed.

## 2018-05-20 NOTE — Progress Notes (Signed)
Subjective:   Patient ID: Richard Sandoval, male   DOB: 72 y.o.   MRN: [id number]   HPI Patient continues to have discomfort plantar aspect of the left lateral foot and states that he still feels like something is in there   ROS      Objective:  Physical Exam  Neurovascular status intact with patient found to be morbidly obese with discomfort in the plantar lateral aspect of the left foot but no breakdown of skin no erythema no edema no drainage with just pain in this area     Assessment:  Very difficult problem with what appears to be inflammatory problem with MRI that is negative with no indications of abscess or of structural pathology of the left lateral foot     Plan:  H&P and discussed the difficulty of this condition as at this time I have nothing to go with and it appears that the morbid obesity is a complicating factor.  Working to try him on a oral 12-day steroid pack to try to reduce the inflammation and try to continue to use immobilization and heat ice therapy to the area.  Patient will be seen back and again if any changes were to occur he is to come in immediately.  Reviewed MRI indicating normal findings in this area

## 2018-05-27 ENCOUNTER — Encounter (INDEPENDENT_AMBULATORY_CARE_PROVIDER_SITE_OTHER): Payer: Self-pay | Admitting: Orthopedic Surgery

## 2018-05-27 ENCOUNTER — Ambulatory Visit (INDEPENDENT_AMBULATORY_CARE_PROVIDER_SITE_OTHER): Payer: 59 | Admitting: Physician Assistant

## 2018-05-27 VITALS — Ht 70.0 in | Wt >= 6400 oz

## 2018-05-27 DIAGNOSIS — I87323 Chronic venous hypertension (idiopathic) with inflammation of bilateral lower extremity: Secondary | ICD-10-CM

## 2018-05-27 DIAGNOSIS — S91302A Unspecified open wound, left foot, initial encounter: Secondary | ICD-10-CM

## 2018-05-27 DIAGNOSIS — M48062 Spinal stenosis, lumbar region with neurogenic claudication: Secondary | ICD-10-CM

## 2018-05-27 DIAGNOSIS — I2699 Other pulmonary embolism without acute cor pulmonale: Secondary | ICD-10-CM

## 2018-05-28 ENCOUNTER — Encounter (INDEPENDENT_AMBULATORY_CARE_PROVIDER_SITE_OTHER): Payer: Self-pay | Admitting: Physician Assistant

## 2018-05-28 NOTE — Progress Notes (Signed)
Office Visit Note   Patient: Richard Sandoval           Date of Birth: January 26, 1946           MRN: [id number] Visit Date: 05/27/2018              Requested by: Laurann Montana, MD [phone] [address] [place], [place] [address] PCP: Laurann Montana, MD  Chief Complaint  Patient presents with  . Left Foot - Pain      HPI: The patient is a 72 year old male who presents for evaluation of his left lower leg edema and left foot lateral pain.  He has a history of sensory changes in his lower extremities chronically with a history of lumbar stenosis and neurogenic claudication.  He did have back surgery in May of this year.  He has been followed for bilateral lower extremity edema and reports has been utilizing some Unna boots for compression and wearing compression stockings but is concerned about the degree of swelling and the pain about his left foot in particular.  He reports the left leg swelling is actually improved but the proximal left calf measures 62 cm today.  He is concerned that he had infection  In the left foot. He reports he was treated with a couple of courses of antibiotics for the left lower extremity and then was recently started on prednisone and does feel like the steroids are helping some with his neuropathic pain and swelling.  He is getting callus and breakdown over the base of the left fifth metatarsal.  He does have a walker boot and has been walking with this and ambulates with a cane.  He is on Eliquis for history of pulmonary emboli and Lasix due to history of heart failure related to his pulmonary emboli.  He reports that he is not diabetic. His BMI is > 60 and last albumin earlier this year was 3.1.   Assessment & Plan: Visit Diagnoses:  1. Open wound of left foot, initial encounter   2. Morbid obesity (HCC)   3. Lumbar stenosis with neurogenic claudication   4. Bilateral pulmonary embolism (HCC)   5. Idiopathic chronic venous hypertension of both legs with  inflammation     Plan: The patient was placed in a Dynaflex layered compression dressing to the left lower extremity and a postoperative shoe to try to decrease his edema.  He does not appear to have acute infection but does have marked edema as noted with the proximal calf on the left being 62 cm today.  We will have him follow-up and next Monday or Tuesday for reassessment of his edema.  Recommend elevation as much as he can tolerate and nonweightbearing as much as possible  Follow-Up Instructions: Return in about 4 days (around 05/31/2018).   Ortho Exam  Patient is alert, oriented, no adenopathy, well-dressed, normal affect, normal respiratory effort. The patient is a morbidly obese male in NAD. He has massive edema of the left lower leg with calf circumference of 62 cm and blistering of the proximal calf skin/stasis dermatitis changes consistent with chronic venous stasis without breakdown or drainage. No signs of cellulitis. There is breakdown of the skin over the base of the left 5th MT which is limited to skin with some scant serosanguinous drainage . He is tender to palpation over this area. He has palpable pedal pulses in the left foot.  The right lower leg is edematous, but there is no breakdown or blistering of the  skin. No signs of cellulitis.   Imaging: No results found. No images are attached to the encounter.  Labs: Lab Results  Component Value Date   REPTSTATUS 07/30/2007 FINAL 07/25/2007   REPTSTATUS 07/27/2007 FINAL 07/25/2007   GRAMSTAIN  07/25/2007    MODERATE WBC PRESENT, PREDOMINANTLY PMN FEW SQUAMOUS EPITHELIAL CELLS PRESENT MODERATE GRAM POSITIVE COCCI IN PAIRS IN CHAINS IN CLUSTERS RARE GRAM POSITIVE RODS   GRAMSTAIN  07/25/2007    MODERATE WBC PRESENT, PREDOMINANTLY PMN FEW SQUAMOUS EPITHELIAL CELLS PRESENT MODERATE GRAM POSITIVE COCCI IN PAIRS IN CLUSTERS IN CHAINS   CULT NO ANAEROBES ISOLATED 07/25/2007   CULT  07/25/2007    MODERATE METHICILLIN RESISTANT  STAPHYLOCOCCUS AUREUS Note: RIFAMPIN AND GENTAMICIN SHOULD NOT BE USED AS SINGLE DRUGS FOR TREATMENT OF STAPH INFECTIONS. This organism DOES NOT demonstrate inducible Clindamycin resistance in vitro. DOXYCYCLINE IS SENSITIVE CRITICAL RESULT CALLED TO, READ BACK BY AND  VERIFIED WITH: JOSLYN RN 12.30.08 11.30 BY PERCN   LABORGA METHICILLIN RESISTANT STAPHYLOCOCCUS AUREUS 07/25/2007     Lab Results  Component Value Date   ALBUMIN 3.1 (L) 12/17/2017   ALBUMIN 2.7 (L) 01/25/2014   ALBUMIN 3.0 (L) 01/24/2014    Body mass index is 60.75 kg/m.  Orders:  No orders of the defined types were placed in this encounter.  No orders of the defined types were placed in this encounter.    Procedures: No procedures performed  Clinical Data: No additional findings.  ROS:  All other systems negative, except as noted in the HPI. Review of Systems  Objective: Vital Signs: Ht 5\' 10"  (1.778 m)   Wt (!) 423 lb 6.4 oz (192.1 kg)   BMI 60.75 kg/m   Specialty Comments:  No specialty comments available.  PMFS History: Patient Active Problem List   Diagnosis Date Noted  . Lumbar stenosis with neurogenic claudication 12/07/2017  . Right heart failure (HCC) 08/27/2017  . Pulmonary hypertension (HCC) 06/04/2017  . GERD (gastroesophageal reflux disease) 02/21/2016  . Bilateral pulmonary embolism (HCC) 02/20/2016  . Hypokalemia 01/25/2014  . Pancreatitis, gallstone 01/25/2014  . Morbid obesity (HCC)   . Pancreatitis 01/05/2014  . Abnormal transaminases 01/05/2014  . Dyspnea 12/15/2013  . Upper airway cough syndrome 09/27/2013  . Cellulitis and abscess 05/10/2012  . Morbid (severe) obesity due to excess calories (HCC) 09/24/2006  . DEPRESSIVE DISORDER, NOS 09/24/2006  . BACK PAIN, LOW 09/24/2006  . OSA (obstructive sleep apnea) 09/24/2006   Past Medical History:  Diagnosis Date  . DVT (deep venous thrombosis) (HCC)    L leg in 2009  . Dyspnea   . GERD (gastroesophageal reflux  disease)   . Hypertension    enlarged heart  . Morbid obesity (HCC)   . OSA (obstructive sleep apnea)    on CPAP since 1995  . Pancreatitis   . Pulmonary hypertension (HCC) 06/04/2017  . Right heart failure (HCC) 08/27/2017    Family History  Problem Relation Age of Onset  . Breast cancer Mother   . Gout Father   . Hypertension Father   . Diabetes Sister   . Heart disease Brother     Past Surgical History:  Procedure Laterality Date  . BACK SURGERY  04/09/1999  . cervical laminectomy and fusion    . CHOLECYSTECTOMY N/A 01/25/2014   Procedure: LAPAROSCOPIC CHOLECYSTECTOMY WITH INTRAOPERATIVE CHOLANGIOGRAM;  Surgeon: Velora Heckler, MD;  Location: [facility];  Service: General;  Laterality: N/A;  . COLONOSCOPY    . EUS N/A 01/24/2014   Procedure: UPPER ENDOSCOPIC  ULTRASOUND (EUS) RADIAL;  Surgeon: Willis Modena, MD;  Location: [facility];  Service: Endoscopy;  Laterality: N/A;  . KNEE SURGERY  1998  . left inguinal hernia    . LUMBAR LAMINECTOMY    . TRIGGER FINGER RELEASE  08/04/2012   Procedure: MINOR RELEASE TRIGGER FINGER/A-1 PULLEY;  Surgeon: Nicki Reaper, MD;  Location:  S[facility]ERY [facility];  Service: Orthopedics;  Laterality: Right;   Social History   Occupational History  . Occupation: on disability.    Employer: RETIRED    Comment: prev worked for [employer], and at a [employer] plant- chemical exposure x 6 yrs  Tobacco Use  . Smoking status: Never Smoker  . Smokeless tobacco: Never Used  Substance and Sexual Activity  . Alcohol use: No  . Drug use: No  . Sexual activity: Yes

## 2018-05-31 ENCOUNTER — Ambulatory Visit (INDEPENDENT_AMBULATORY_CARE_PROVIDER_SITE_OTHER): Payer: 59 | Admitting: Orthopedic Surgery

## 2018-05-31 ENCOUNTER — Encounter (INDEPENDENT_AMBULATORY_CARE_PROVIDER_SITE_OTHER): Payer: Self-pay | Admitting: Orthopedic Surgery

## 2018-05-31 VITALS — Ht 70.0 in | Wt >= 6400 oz

## 2018-05-31 DIAGNOSIS — I87323 Chronic venous hypertension (idiopathic) with inflammation of bilateral lower extremity: Secondary | ICD-10-CM | POA: Diagnosis not present

## 2018-05-31 NOTE — Progress Notes (Signed)
Office Visit Note   Patient: Richard Sandoval           Date of Birth: [birth year]           MRN: [id number] Visit Date: 05/31/2018              Requested by: Laurann Montana, MD [phone] [address] [place], [place] [address] PCP: Laurann Montana, MD  Chief Complaint  Patient presents with  . Left Leg - Follow-up, Pain      HPI: Patient is a 72 year old gentleman who presents in follow-up for venous and lymphatic insufficiency both lower extremities left worse than right.  Patient's left calf was initially 60 cm in circumference after compression patient's calf is now 58 cm in circumference.  Assessment & Plan: Visit Diagnoses:  1. Idiopathic chronic venous hypertension of both legs with inflammation     Plan: Recommended knee-high compression patient is currently wearing a light weight Ted compression stockings he states this is the best he can do to get on compression.  Recommend the importance of exercise elevation weight loss.  Discussed that we need to resolve his swelling in order to resolve the foot pain.  Follow-Up Instructions: Return in about 3 months (around 08/31/2018).   Ortho Exam  Patient is alert, oriented, no adenopathy, well-dressed, normal affect, normal respiratory effort. Examination patient has no weeping edema from the left leg at this time no edema weeping in the right leg either.  There are no open ulcers he has brawny skin color changes with thickened discolored skin.  He has a good dorsalis pedis pulse calf measures 58 cm in circumference.  Patient states he is status post back surgery he states he is working with his cardiologist primary care physician and his spine surgeon.  Patient has completed a course of prednisone for his foot pain.  Imaging: No results found. No images are attached to the encounter.  Labs: Lab Results  Component Value Date   REPTSTATUS 07/30/2007 FINAL 07/25/2007   REPTSTATUS 07/27/2007 FINAL 07/25/2007   GRAMSTAIN   07/25/2007    MODERATE WBC PRESENT, PREDOMINANTLY PMN FEW SQUAMOUS EPITHELIAL CELLS PRESENT MODERATE GRAM POSITIVE COCCI IN PAIRS IN CHAINS IN CLUSTERS RARE GRAM POSITIVE RODS   GRAMSTAIN  07/25/2007    MODERATE WBC PRESENT, PREDOMINANTLY PMN FEW SQUAMOUS EPITHELIAL CELLS PRESENT MODERATE GRAM POSITIVE COCCI IN PAIRS IN CLUSTERS IN CHAINS   CULT NO ANAEROBES ISOLATED 07/25/2007   CULT  07/25/2007    MODERATE METHICILLIN RESISTANT STAPHYLOCOCCUS AUREUS Note: RIFAMPIN AND GENTAMICIN SHOULD NOT BE USED AS SINGLE DRUGS FOR TREATMENT OF STAPH INFECTIONS. This organism DOES NOT demonstrate inducible Clindamycin resistance in vitro. DOXYCYCLINE IS SENSITIVE CRITICAL RESULT CALLED TO, READ BACK BY AND  VERIFIED WITH: JOSLYN RN 12.30.08 11.30 BY PERCN   LABORGA METHICILLIN RESISTANT STAPHYLOCOCCUS AUREUS 07/25/2007     Lab Results  Component Value Date   ALBUMIN 3.1 (L) 12/17/2017   ALBUMIN 2.7 (L) 01/25/2014   ALBUMIN 3.0 (L) 01/24/2014    Body mass index is 60.75 kg/m.  Orders:  No orders of the defined types were placed in this encounter.  No orders of the defined types were placed in this encounter.    Procedures: No procedures performed  Clinical Data: No additional findings.  ROS:  All other systems negative, except as noted in the HPI. Review of Systems  Objective: Vital Signs: Ht 5\' 10"  (1.778 m)   Wt (!) 423 lb 6.4 oz (192.1 kg)   BMI 60.75 kg/m  Specialty Comments:  No specialty comments available.  PMFS History: Patient Active Problem List   Diagnosis Date Noted  . Lumbar stenosis with neurogenic claudication 12/07/2017  . Right heart failure (HCC) 08/27/2017  . Pulmonary hypertension (HCC) 06/04/2017  . GERD (gastroesophageal reflux disease) 02/21/2016  . Bilateral pulmonary embolism (HCC) 02/20/2016  . Hypokalemia 01/25/2014  . Pancreatitis, gallstone 01/25/2014  . Morbid obesity (HCC)   . Pancreatitis 01/05/2014  . Abnormal transaminases  01/05/2014  . Dyspnea 12/15/2013  . Upper airway cough syndrome 09/27/2013  . Cellulitis and abscess 05/10/2012  . Morbid (severe) obesity due to excess calories (HCC) 09/24/2006  . DEPRESSIVE DISORDER, NOS 09/24/2006  . BACK PAIN, LOW 09/24/2006  . OSA (obstructive sleep apnea) 09/24/2006   Past Medical History:  Diagnosis Date  . DVT (deep venous thrombosis) (HCC)    L leg in 2009  . Dyspnea   . GERD (gastroesophageal reflux disease)   . Hypertension    enlarged heart  . Morbid obesity (HCC)   . OSA (obstructive sleep apnea)    on CPAP since 1995  . Pancreatitis   . Pulmonary hypertension (HCC) 06/04/2017  . Right heart failure (HCC) 08/27/2017    Family History  Problem Relation Age of Onset  . Breast cancer Mother   . Gout Father   . Hypertension Father   . Diabetes Sister   . Heart disease Brother     Past Surgical History:  Procedure Laterality Date  . BACK SURGERY  04/09/1999  . cervical laminectomy and fusion    . CHOLECYSTECTOMY N/A 01/25/2014   Procedure: LAPAROSCOPIC CHOLECYSTECTOMY WITH INTRAOPERATIVE CHOLANGIOGRAM;  Surgeon: Velora Heckler, MD;  Location: [facility];  Service: General;  Laterality: N/A;  . COLONOSCOPY    . EUS N/A 01/24/2014   Procedure: UPPER ENDOSCOPIC ULTRASOUND (EUS) RADIAL;  Surgeon: Willis Modena, MD;  Location: [facility];  Service: Endoscopy;  Laterality: N/A;  . KNEE SURGERY  1998  . left inguinal hernia    . LUMBAR LAMINECTOMY    . TRIGGER FINGER RELEASE  08/04/2012   Procedure: MINOR RELEASE TRIGGER FINGER/A-1 PULLEY;  Surgeon: Nicki Reaper, MD;  Location: [facility];  Service: Orthopedics;  Laterality: Right;   Social History   Occupational History  . Occupation: on disability.    Employer: RETIRED    Comment: prev worked for [employer], and at a [employer] plant- chemical exposure x 6 yrs  Tobacco Use  . Smoking status: Never Smoker  . Smokeless tobacco: Never Used  Substance and Sexual Activity  . Alcohol use: No  .  Drug use: No  . Sexual activity: Yes

## 2018-06-23 ENCOUNTER — Ambulatory Visit
Admission: RE | Admit: 2018-06-23 | Discharge: 2018-06-23 | Disposition: A | Discharge status: Home / Self Care | Payer: Medicare HMO | Source: Ambulatory Visit | Attending: Neurological Surgery | Admitting: Neurological Surgery

## 2018-06-23 DIAGNOSIS — Z981 Arthrodesis status: Secondary | ICD-10-CM

## 2018-06-29 ENCOUNTER — Ambulatory Visit (INDEPENDENT_AMBULATORY_CARE_PROVIDER_SITE_OTHER): Payer: 59 | Admitting: Cardiovascular Disease

## 2018-06-29 ENCOUNTER — Encounter: Payer: Self-pay | Admitting: Cardiovascular Disease

## 2018-06-29 VITALS — BP 122/72 | HR 73 | Ht 70.0 in | Wt >= 6400 oz

## 2018-06-29 DIAGNOSIS — I50812 Chronic right heart failure: Secondary | ICD-10-CM | POA: Diagnosis not present

## 2018-06-29 DIAGNOSIS — R0602 Shortness of breath: Secondary | ICD-10-CM | POA: Diagnosis not present

## 2018-06-29 DIAGNOSIS — R600 Localized edema: Secondary | ICD-10-CM

## 2018-06-29 DIAGNOSIS — I872 Venous insufficiency (chronic) (peripheral): Secondary | ICD-10-CM

## 2018-06-29 NOTE — Progress Notes (Signed)
Cardiology Office Note   Date:  06/29/2018   ID:  Aseem Sessums, DOB [birth year], MRN [id number]  PCP:  Laurann Montana, MD  Cardiologist:   Chilton Si, MD   No chief complaint on file.    History of Present Illness: Richard Sandoval is a 72 y.o. male with prior pulmonary embolism, hypertension, morbid obesity, OSA on CPAP here for follow up.  He was initially seen 05/2017 for the evaluation of chronic dyspnea on exertion.  Richard Sandoval reports many years of shortness of breath.  This dates back to 2000 after he had surgery on his back.  Since that time he continues to have pain and has not been able to be very physically active.  He has noted progressive dyspnea on exertion.  In July 2017 he had a left lower extremity DVT and pulmonary embolism.  Richard Sandoval had an echo 02/21/16 that revealed LVEF 50-55% with grade 1 diastolic dysfunction.  PASP was 52 mmHg.  He thinks that the shortness of breath has gotten worse since that time.  He still has lower extremity edema that is worse in the left than the right.  This started after developing cellulitis in that leg.  He has OSA and uses CPAP regularly for the last 25 years.  He never smoked but has been exposed to secondhand smoke.  Since his last appointment Richard Sandoval was referred for an echo 06/11/17 that revealed LVEF 55-60% with normal diastolic function.  Richard Sandoval had lumbar decompression surgery 11/2017.  This has helped his back pain.  However he has been struggling with left foot pain and had to have an injection.  At his last appointment Lasix was increased due to lower extremity edema.  He was also ordered for Unna boots.  Once he no longer had weeping edema they stopped coming out to do that [employer].  Since the last appointment he has been able to start back lying in his bed for sleep.  He thinks that elevating his legs and wearing compression socks has been very helpful.  He notes a significant improvement in his edema.  He has no  orthopnea or PND.  He stopped taking Lasix over a month ago because it was making him go to the bathroom too much and because it was making his back hurt.  He had urinary urgency and decided to stop taking it altogether.  His legs continued to improve despite stopping Lasix.  He has not been getting much formal exercise because of pain in his foot.  He denies chest pain, orthopnea, or PND.  He does get short of breath with exertion.   Past Medical History:  Diagnosis Date  . DVT (deep venous thrombosis) (HCC)    L leg in 2009  . Dyspnea   . GERD (gastroesophageal reflux disease)   . Hypertension    enlarged heart  . Morbid obesity (HCC)   . OSA (obstructive sleep apnea)    on CPAP since 1995  . Pancreatitis   . Pulmonary hypertension (HCC) 06/04/2017  . Right heart failure (HCC) 08/27/2017    Past Surgical History:  Procedure Laterality Date  . BACK SURGERY  04/09/1999  . cervical laminectomy and fusion    . CHOLECYSTECTOMY N/A 01/25/2014   Procedure: LAPAROSCOPIC CHOLECYSTECTOMY WITH INTRAOPERATIVE CHOLANGIOGRAM;  Surgeon: Velora Heckler, MD;  Location: [facility];  Service: General;  Laterality: N/A;  . COLONOSCOPY    . EUS N/A 01/24/2014   Procedure: UPPER ENDOSCOPIC ULTRASOUND (EUS) RADIAL;  Surgeon: Willis ModenaWilliam [name], MD;  [facility];  Service: Endoscopy;  Laterality: N/A;  . KNEE SURGERY  1998  . left inguinal hernia    . LUMBAR LAMINECTOMY    . TRIGGER FINGER RELEASE  08/04/2012   Procedure: MINOR RELEASE TRIGGER FINGER/A-1 PULLEY;  Surgeon: Nicki ReaperGary R Kuzma, MD;  Location: [facility];  Service: Orthopedics;  Laterality: Right;     Current Outpatient Medications  Medication Sig Dispense Refill  . apixaban (ELIQUIS) 5 MG TABS tablet Take 5 mg by mouth 2 (two) times daily.    . colchicine 0.6 MG tablet TAKE 1 TABLET BY MOUTH ONCE DAILY AS NEEDED FOR PAIN  2  . docusate sodium (COLACE) 100 MG capsule Take 100 mg by mouth daily as needed for mild constipation.    .  furosemide (LASIX) 40 MG tablet Take 40 mg by mouth daily.    [name]. gabapentin (NEURONTIN) 100 MG capsule     . methocarbamol (ROBAXIN) 500 MG tablet Take 1 tablet (500 mg total) by mouth every 6 (six) hours as needed for muscle spasms. 40 tablet 1  . oxyCODONE-acetaminophen (PERCOCET/ROXICET) 5-325 MG tablet Take 1-2 tablets by mouth every 3 (three) hours as needed for severe pain. 40 tablet 0  . OXYGEN 2lpm  with CPAP  [facility]    . pantoprazole (PROTONIX) 40 MG tablet Take 1 tablet (40 mg total) daily by mouth. 30 tablet 11  . polyethylene glycol (MIRALAX / GLYCOLAX) packet Take 17 g by mouth daily as needed for mild constipation.    . predniSONE (DELTASONE) 10 MG tablet 12 day tapering dose 48 tablet 0   No current facility-administered medications for this visit.     Allergies:   Penicillins    Social History:  The patient  reports that he has never smoked. He has never used smokeless tobacco. He reports that he does not drink alcohol or use drugs.   Family History:  The patient's family history includes Breast cancer in his mother; Diabetes in his sister; Gout in his father; Heart disease in his brother; Hypertension in his father.    ROS:  Please see the history of present illness.   Otherwise, review of systems are positive for none.   All other systems are reviewed and negative.    PHYSICAL EXAM: VS:  BP 122/72   Pulse 73   Ht 5\' 10"  (1.778 m)   Wt (!) 423 lb 12.8 oz (192.2 kg)   BMI 60.81 kg/m  , BMI Body mass index is 60.81 kg/m. GENERAL:  Well appearing HEENT: Pupils equal round and reactive, fundi not visualized, oral mucosa unremarkable NECK:  No jugular venous distention, waveform within normal limits, carotid upstroke brisk and symmetric, no bruits, no thyromegaly LYMPHATICS:  No cervical adenopathy LUNGS:  Clear to auscultation bilaterally HEART:  RRR.  PMI not displaced or sustained,S1 and S2 within normal limits, no S3, no S4, no clicks, no rubs, no murmurs ABD:  Flat,  positive bowel sounds normal in frequency in pitch, no bruits, no rebound, no guarding, no midline pulsatile mass, no hepatomegaly, no splenomegaly EXT:  2 plus pulses throughout, 2+ tense, woody edema to the upper tibia bilaterally, no cyanosis no clubbing SKIN:  No rashes no nodules NEURO:  Cranial nerves II through XII grossly intact, motor grossly intact throughout PSYCH:  Cognitively intact, oriented to person place and time   EKG:  EKG is not ordered today. The ekg ordered 06/04/17 demonstrates sinus rhythm.  Rate 68 bpm.  PACs.  LAFB.   04/20/18: Sinus rhythm.  Rate 78 bpm.  PVC.  Low voltage precordial leads.  LAFB   Recent Labs: 08/27/2017: Magnesium 2.0 12/17/2017: ALT 26; BUN 13; Creatinine, Ser 0.99; Hemoglobin 12.9; Platelets 289; Potassium 3.9; Sodium 140   02/09/17: WBC 5.1, hemoglobin 15, hematocrit 45.1, platelets 191 Sodium 139, potassium 4.5, BUN 18, creatinine 1.0 AST 23, ALT 15 Total cholesterol 165, triglycerides 47, HDL 37, LDL 119 next TSH 1.77  Lipid Panel    Component Value Date/Time   TRIG 40 01/05/2014 1759      Wt Readings from Last 3 Encounters:  06/29/18 (!) 423 lb 12.8 oz (192.2 kg)  05/31/18 (!) 423 lb 6.4 oz (192.1 kg)  05/27/18 (!) 423 lb 6.4 oz (192.1 kg)      ASSESSMENT AND PLAN:  # Shortness of breath: # Morbid obesity:  Richard Sandoval shortness of breath has improved but is still present with exertion.  Pulmonary pressures were better on his repeat echo.  I suspect that this is mostly due to morbid obesity and physical inactivity.  We discussed the importance of exercising at least 150 minutes weekly.  He is thinking about getting an exercise bike for Christmas.  # LE edema: Lower extremity edema has improved with elevation and compression socks.  Some of this is probably due to prior DVT.  He was also sleeping in a recliner due to back pain.  Now that he is lying in bed the symptoms have improved.  He continues to have significant lower  extremity edema, though improved from prior.  I have asked him to start back taking Lasix 40 mg daily instead of 80 mg.    # Pulmonary hypertension: Pulmonary pressures were moderately elevated last year in the setting of having a pulmonary embolism.  This normalized on repeat but he has some RV failure.  This may lead to underestimating his pulmonary pressure.       Current medicines are reviewed at length with the patient today.  The patient does not have concerns regarding medicines.  The following changes have been made: Resume lasix 40mg  daily.  Labs/ tests ordered today include:   No orders of the defined types were placed in this encounter.    Disposition:   FU with Evalisse Prajapati C. Duke Salvia, MD, [facility] in 1 year.  APP in 6 months.     Signed, Ramsay Bognar C. Duke Salvia, MD, [facility]  06/29/2018 6:14 PM    [facility]

## 2018-06-29 NOTE — Patient Instructions (Signed)
Medication Instructions:  RESUME FUROSEMIDE 40 MG DAILY   If you need a refill on your cardiac medications before your next appointment, please call your pharmacy.   Lab work: NONE  Testing/Procedures: NONE  Follow-Up: Your physician wants you to follow-up in: 6 MONTHS WITH PA/NP  Please call our office IN 3-4 MONTHS to schedule the follow-up appointment.     Your physician wants you to follow-up in: 12 MONTHS [facility]  Please call our office IN 9-10 MONTHS  to schedule the follow-up appointment.

## 2018-06-30 NOTE — Telephone Encounter (Signed)
Patient seen in office 12/3

## 2018-07-06 ENCOUNTER — Telehealth: Payer: Self-pay | Admitting: Cardiovascular Disease

## 2018-07-06 NOTE — Telephone Encounter (Signed)
Received orders today and will have Dr Duke Salviaandolph sign and fax back

## 2018-07-06 NOTE — Telephone Encounter (Signed)
Follow Up:    Juliette AlcideMelinda, from [facility] is checking on the orders that was last faxed here on 06-09-18. If you can not find them, she will refax them.

## 2018-07-19 ENCOUNTER — Encounter: Payer: Self-pay | Admitting: Podiatry

## 2018-07-19 ENCOUNTER — Ambulatory Visit (INDEPENDENT_AMBULATORY_CARE_PROVIDER_SITE_OTHER): Payer: 59 | Admitting: Podiatry

## 2018-07-19 DIAGNOSIS — M779 Enthesopathy, unspecified: Secondary | ICD-10-CM

## 2018-07-19 MED ORDER — TRIAMCINOLONE ACETONIDE 10 MG/ML IJ SUSP
10.0000 mg | Freq: Once | INTRAMUSCULAR | Status: AC
  Administered 2018-07-19: 10 mg

## 2018-07-20 NOTE — Progress Notes (Signed)
Subjective:   Patient ID: Richard Sandoval, male   DOB: [age] y.o.   MRN: [id number]   HPI Patient presents stating that the bottom of his left foot is starting to hurt again he feels like there is a lesion and it is hard to walk on currently.  Seem to make slight improvement but it has been quite sore again   ROS      Objective:  Physical Exam  Neurovascular status intact with discomfort in the plantar fifth metatarsal left with inflammation fluid noted with patient being extremely obese which is complicating factor     Assessment:  Inflammatory tendinitis fifth metatarsal base with discomfort into the plantar capsule with inflammation keratotic tissue formation     Plan:  H&P reviewed condition we are get a try injection again as it seems to be the only thing that gives him relief and I did carefully inject the plantar tendon complex 3 mg Dexasone Kenalog 5 mg Xylocaine debrided lesion and reappoint as needed.  We have tried immobilization we tried other modalities without relief and this is very difficult I think to get better completely

## 2018-08-12 DIAGNOSIS — I872 Venous insufficiency (chronic) (peripheral): Secondary | ICD-10-CM | POA: Diagnosis not present

## 2018-08-12 DIAGNOSIS — Z86010 Personal history of colonic polyps: Secondary | ICD-10-CM | POA: Diagnosis not present

## 2018-08-12 DIAGNOSIS — M5136 Other intervertebral disc degeneration, lumbar region: Secondary | ICD-10-CM | POA: Diagnosis not present

## 2018-08-12 DIAGNOSIS — M779 Enthesopathy, unspecified: Secondary | ICD-10-CM | POA: Diagnosis not present

## 2018-08-12 DIAGNOSIS — R609 Edema, unspecified: Secondary | ICD-10-CM | POA: Diagnosis not present

## 2018-08-30 ENCOUNTER — Ambulatory Visit (INDEPENDENT_AMBULATORY_CARE_PROVIDER_SITE_OTHER): Payer: Medicare Other | Admitting: Orthopedic Surgery

## 2018-09-09 DIAGNOSIS — Z7901 Long term (current) use of anticoagulants: Secondary | ICD-10-CM | POA: Diagnosis not present

## 2018-09-09 DIAGNOSIS — Z1211 Encounter for screening for malignant neoplasm of colon: Secondary | ICD-10-CM | POA: Diagnosis not present

## 2018-09-09 DIAGNOSIS — Z86010 Personal history of colonic polyps: Secondary | ICD-10-CM | POA: Diagnosis not present

## 2018-09-16 ENCOUNTER — Other Ambulatory Visit: Payer: Self-pay | Admitting: Neurological Surgery

## 2018-09-16 DIAGNOSIS — M48062 Spinal stenosis, lumbar region with neurogenic claudication: Secondary | ICD-10-CM

## 2018-09-28 ENCOUNTER — Ambulatory Visit
Admission: RE | Admit: 2018-09-28 | Discharge: 2018-09-28 | Disposition: A | Discharge status: Home / Self Care | Payer: Medicare Other | Source: Ambulatory Visit | Attending: Neurological Surgery | Admitting: Neurological Surgery

## 2018-09-28 DIAGNOSIS — M48062 Spinal stenosis, lumbar region with neurogenic claudication: Secondary | ICD-10-CM

## 2018-09-28 DIAGNOSIS — M4807 Spinal stenosis, lumbosacral region: Secondary | ICD-10-CM | POA: Diagnosis not present

## 2018-09-28 DIAGNOSIS — M5127 Other intervertebral disc displacement, lumbosacral region: Secondary | ICD-10-CM | POA: Diagnosis not present

## 2018-09-28 MED ORDER — GADOBENATE DIMEGLUMINE 529 MG/ML IV SOLN
20.0000 mL | Freq: Once | INTRAVENOUS | Status: AC | PRN
  Administered 2018-09-28: 20 mL via INTRAVENOUS

## 2018-09-29 ENCOUNTER — Other Ambulatory Visit: Payer: Self-pay | Admitting: Gastroenterology

## 2018-09-30 ENCOUNTER — Telehealth: Payer: Self-pay | Admitting: Cardiovascular Disease

## 2018-09-30 NOTE — Telephone Encounter (Signed)
   [facility] Pre-operative Risk Assessment    Request for surgical clearance:  1. What type of surgery is being performed? Colonoscopy   2. When is this surgery scheduled? 11/23/18    3. What type of clearance is required (medical clearance vs. Pharmacy clearance to hold med vs. Both)? Pharmacy  4. Are there any medications that need to be held prior to surgery and how long?Xarelto; not specified how long   5. Practice name and name of physician performing surgery? [facility]/ Dr. Michail Sermon   6. What is your office phone number [phone]    7.   What is your office fax number [phone]  8.   Anesthesia type (None, local, MAC, general) ? Propofol   Peyton Najjar Kyona Chauncey 09/30/2018, 5:09 PM  _________________________________________________________________   (provider comments below)

## 2018-10-01 NOTE — Telephone Encounter (Signed)
   Primary Cardiologist: Chilton Si, MD  Chart reviewed as part of pre-operative protocol coverage. Patient is on Eliquis for PE/ DVT. This is being followed and managed by PCP. Please contact PCP for guidance regarding peri procedural management of anticoagulation. Also of note, there is a discrepancy between clearance form and med list in Epic. Clearance is asking to hold Xarelto but our current med list reports pt is on Eliquis.   Robbie Lis, PA-C 10/01/2018, 8:43 AM

## 2018-11-30 ENCOUNTER — Ambulatory Visit (HOSPITAL_COMMUNITY): Admit: 2018-11-30 | Payer: 59 | Admitting: Gastroenterology

## 2018-11-30 ENCOUNTER — Encounter (HOSPITAL_COMMUNITY): Payer: Self-pay

## 2018-11-30 SURGERY — COLONOSCOPY WITH PROPOFOL
Anesthesia: Monitor Anesthesia Care

## 2019-02-01 DIAGNOSIS — Z86711 Personal history of pulmonary embolism: Secondary | ICD-10-CM | POA: Diagnosis not present

## 2019-02-01 DIAGNOSIS — M5136 Other intervertebral disc degeneration, lumbar region: Secondary | ICD-10-CM | POA: Diagnosis not present

## 2019-02-01 DIAGNOSIS — Z7901 Long term (current) use of anticoagulants: Secondary | ICD-10-CM | POA: Diagnosis not present

## 2019-02-01 DIAGNOSIS — R609 Edema, unspecified: Secondary | ICD-10-CM | POA: Diagnosis not present

## 2019-02-01 DIAGNOSIS — I872 Venous insufficiency (chronic) (peripheral): Secondary | ICD-10-CM | POA: Diagnosis not present

## 2019-02-02 ENCOUNTER — Other Ambulatory Visit: Payer: Self-pay | Admitting: Critical Care Medicine

## 2019-02-02 DIAGNOSIS — Z20822 Contact with and (suspected) exposure to covid-19: Secondary | ICD-10-CM

## 2019-02-07 LAB — NOVEL CORONAVIRUS, NAA: SARS-CoV-2, NAA: NOT DETECTED

## 2019-02-14 ENCOUNTER — Other Ambulatory Visit: Payer: Self-pay | Admitting: Gastroenterology

## 2019-02-21 DIAGNOSIS — G4733 Obstructive sleep apnea (adult) (pediatric): Secondary | ICD-10-CM | POA: Diagnosis not present

## 2019-02-28 ENCOUNTER — Other Ambulatory Visit: Payer: Self-pay | Admitting: Gastroenterology

## 2019-02-28 ENCOUNTER — Encounter (HOSPITAL_COMMUNITY): Payer: Self-pay | Admitting: *Deleted

## 2019-03-01 ENCOUNTER — Other Ambulatory Visit (HOSPITAL_COMMUNITY)
Admission: RE | Admit: 2019-03-01 | Discharge: 2019-03-01 | Disposition: A | Discharge status: Home / Self Care | Payer: 59 | Source: Ambulatory Visit | Attending: Gastroenterology | Admitting: Gastroenterology

## 2019-03-01 DIAGNOSIS — Z01812 Encounter for preprocedural laboratory examination: Secondary | ICD-10-CM | POA: Diagnosis not present

## 2019-03-01 DIAGNOSIS — Z20828 Contact with and (suspected) exposure to other viral communicable diseases: Secondary | ICD-10-CM | POA: Diagnosis not present

## 2019-03-01 LAB — SARS CORONAVIRUS 2 (TAT 6-24 HRS): SARS Coronavirus 2: NEGATIVE

## 2019-03-04 ENCOUNTER — Ambulatory Visit (HOSPITAL_COMMUNITY)
Admission: RE | Admit: 2019-03-04 | Discharge: 2019-03-04 | Disposition: A | Discharge status: Home / Self Care | Payer: 59 | Source: Ambulatory Visit | Attending: Gastroenterology | Admitting: Gastroenterology

## 2019-03-04 ENCOUNTER — Ambulatory Visit (HOSPITAL_COMMUNITY): Payer: 59 | Admitting: Certified Registered"

## 2019-03-04 ENCOUNTER — Encounter (HOSPITAL_COMMUNITY): Payer: Self-pay

## 2019-03-04 ENCOUNTER — Encounter (HOSPITAL_COMMUNITY)
Admission: RE | Disposition: A | Discharge status: Home / Self Care | Payer: Self-pay | Source: Ambulatory Visit | Attending: Gastroenterology

## 2019-03-04 DIAGNOSIS — Z1211 Encounter for screening for malignant neoplasm of colon: Secondary | ICD-10-CM | POA: Insufficient documentation

## 2019-03-04 DIAGNOSIS — Z79899 Other long term (current) drug therapy: Secondary | ICD-10-CM | POA: Diagnosis not present

## 2019-03-04 DIAGNOSIS — G4730 Sleep apnea, unspecified: Secondary | ICD-10-CM | POA: Diagnosis not present

## 2019-03-04 DIAGNOSIS — K640 First degree hemorrhoids: Secondary | ICD-10-CM | POA: Diagnosis not present

## 2019-03-04 DIAGNOSIS — I10 Essential (primary) hypertension: Secondary | ICD-10-CM | POA: Diagnosis not present

## 2019-03-04 DIAGNOSIS — Z6841 Body Mass Index (BMI) 40.0 and over, adult: Secondary | ICD-10-CM | POA: Insufficient documentation

## 2019-03-04 DIAGNOSIS — Z7901 Long term (current) use of anticoagulants: Secondary | ICD-10-CM | POA: Insufficient documentation

## 2019-03-04 DIAGNOSIS — K4090 Unilateral inguinal hernia, without obstruction or gangrene, not specified as recurrent: Secondary | ICD-10-CM | POA: Diagnosis not present

## 2019-03-04 DIAGNOSIS — Z86718 Personal history of other venous thrombosis and embolism: Secondary | ICD-10-CM | POA: Diagnosis not present

## 2019-03-04 DIAGNOSIS — K5730 Diverticulosis of large intestine without perforation or abscess without bleeding: Secondary | ICD-10-CM | POA: Diagnosis not present

## 2019-03-04 DIAGNOSIS — K649 Unspecified hemorrhoids: Secondary | ICD-10-CM | POA: Diagnosis not present

## 2019-03-04 DIAGNOSIS — Z880 Allergy status to penicillin: Secondary | ICD-10-CM | POA: Diagnosis not present

## 2019-03-04 DIAGNOSIS — Z860100 Personal history of colon polyps, unspecified: Secondary | ICD-10-CM

## 2019-03-04 DIAGNOSIS — K5790 Diverticulosis of intestine, part unspecified, without perforation or abscess without bleeding: Secondary | ICD-10-CM | POA: Diagnosis not present

## 2019-03-04 DIAGNOSIS — Z9981 Dependence on supplemental oxygen: Secondary | ICD-10-CM | POA: Diagnosis not present

## 2019-03-04 DIAGNOSIS — M1612 Unilateral primary osteoarthritis, left hip: Secondary | ICD-10-CM | POA: Diagnosis not present

## 2019-03-04 HISTORY — PX: COLONOSCOPY WITH PROPOFOL: SHX5780

## 2019-03-04 SURGERY — COLONOSCOPY WITH PROPOFOL
Anesthesia: Monitor Anesthesia Care

## 2019-03-04 MED ORDER — LACTATED RINGERS IV SOLN: INTRAVENOUS | Status: DC

## 2019-03-04 MED ORDER — SODIUM CHLORIDE 0.9 % IV SOLN
INTRAVENOUS | Status: DC
  Administered 2019-03-04: 11:00:00 via INTRAVENOUS

## 2019-03-04 MED ORDER — PROPOFOL 10 MG/ML IV BOLUS
INTRAVENOUS | Status: DC | PRN
  Administered 2019-03-04: 20 mg via INTRAVENOUS
  Administered 2019-03-04 (×3): 10 mg via INTRAVENOUS

## 2019-03-04 MED ORDER — PROPOFOL 10 MG/ML IV BOLUS
INTRAVENOUS | Status: AC
  Filled 2019-03-04: qty 20

## 2019-03-04 MED ORDER — PROPOFOL 500 MG/50ML IV EMUL
INTRAVENOUS | Status: DC | PRN
  Administered 2019-03-04: 125 ug/kg/min via INTRAVENOUS

## 2019-03-04 MED ORDER — SODIUM CHLORIDE 0.9 % IV SOLN
INTRAVENOUS | Status: DC
  Administered 2019-03-04: 10:00:00 via INTRAVENOUS

## 2019-03-04 SURGICAL SUPPLY — 22 items

## 2019-03-04 NOTE — Anesthesia Preprocedure Evaluation (Addendum)
Anesthesia Evaluation  Patient identified by MRN, date of birth, ID band Patient awake    Reviewed: Allergy & Precautions, NPO status , Patient's Chart, lab work & pertinent test results  History of Anesthesia Complications Negative for: history of anesthetic complications  Airway Mallampati: II  TM Distance: >3 FB Neck ROM: Full    Dental  (+) Dental Advisory Given, Loose,    Pulmonary sleep apnea and Continuous Positive Airway Pressure Ventilation ,    Pulmonary exam normal        Cardiovascular hypertension, + DVT  Normal cardiovascular exam   '18 TTE - Moderate concentric LVH. EF 55% to 60%. RV cavity size was moderately dilated. RV systolic function was moderately reduced. Mild TR. Normal PASP.    Neuro/Psych PSYCHIATRIC DISORDERS Depression negative neurological ROS     GI/Hepatic Neg liver ROS, GERD  Controlled,  Endo/Other  Morbid obesity  Renal/GU negative Renal ROS     Musculoskeletal negative musculoskeletal ROS (+)   Abdominal   Peds  Hematology negative hematology ROS (+)   Anesthesia Other Findings   Reproductive/Obstetrics                            Anesthesia Physical Anesthesia Plan  ASA: IV  Anesthesia Plan: MAC   Post-op Pain Management:    Induction: Intravenous  PONV Risk Score and Plan: 1 and Propofol infusion and Treatment may vary due to age or medical condition  Airway Management Planned: Natural Airway and Mask  Additional Equipment: None  Intra-op Plan:   Post-operative Plan:   Informed Consent: I have reviewed the patients History and Physical, chart, labs and discussed the procedure including the risks, benefits and alternatives for the proposed anesthesia with the patient or authorized representative who has indicated his/her understanding and acceptance.       Plan Discussed with: CRNA and Anesthesiologist  Anesthesia Plan Comments:         Anesthesia Quick Evaluation

## 2019-03-04 NOTE — Transfer of Care (Signed)
Immediate Anesthesia Transfer of Care Note  Patient: Richard Sandoval  Procedure(s) Performed: COLONOSCOPY WITH PROPOFOL (N/A )  Patient Location: PACU  Anesthesia Type:MAC  Level of Consciousness: awake, alert  and oriented  Airway & Oxygen Therapy: Patient Spontanous Breathing and Patient connected to face mask oxygen  Post-op Assessment: Report given to RN and Post -op Vital signs reviewed and stable  Post vital signs: Reviewed and stable  Last Vitals:  Vitals Value Taken Time  BP    Temp    Pulse    Resp 19 03/04/19 1136  SpO2    Vitals shown include unvalidated device data.  Last Pain:  Vitals:   03/04/19 0911  TempSrc: Oral  PainSc: 0-No pain         Complications: No apparent anesthesia complications

## 2019-03-04 NOTE — Discharge Instructions (Signed)
YOU HAD AN ENDOSCOPIC PROCEDURE TODAY: Refer to the procedure report and other information in the discharge instructions given to you for any specific questions about what was found during the examination. If this information does not answer your questions, please call [place] office at [phone] to clarify.  ° °YOU SHOULD EXPECT: Some feelings of bloating in the abdomen. Passage of more gas than usual. Walking can help get rid of the air that was put into your GI tract during the procedure and reduce the bloating. If you had a lower endoscopy (such as a colonoscopy or flexible sigmoidoscopy) you may notice spotting of blood in your stool or on the toilet paper. Some abdominal soreness may be present for a day or two, also. ° °DIET: Your first meal following the procedure should be a light meal and then it is ok to progress to your normal diet. A half-sandwich or bowl of soup is an example of a good first meal. Heavy or fried foods are harder to digest and may make you feel nauseous or bloated. Drink plenty of fluids but you should avoid alcoholic beverages for 24 hours. If you had a esophageal dilation, please see attached instructions for diet.   ° °ACTIVITY: Your care partner should take you home directly after the procedure. You should plan to take it easy, moving slowly for the rest of the day. You can resume normal activity the day after the procedure however YOU SHOULD NOT DRIVE, use power tools, machinery or perform tasks that involve climbing or major physical exertion for 24 hours (because of the sedation medicines used during the test).  ° °SYMPTOMS TO REPORT IMMEDIATELY: °A gastroenterologist can be reached at any hour. Please call [phone]  for any of the following symptoms:  °Following lower endoscopy (colonoscopy, flexible sigmoidoscopy) °Excessive amounts of blood in the stool  °Significant tenderness, worsening of abdominal pains  °Swelling of the abdomen that is new, acute  °Fever of 100° or  higher  °Following upper endoscopy (EGD, EUS, ERCP, esophageal dilation) °Vomiting of blood or coffee ground material  °New, significant abdominal pain  °New, significant chest pain or pain under the shoulder blades  °Painful or persistently difficult swallowing  °New shortness of breath  °Black, tarry-looking or red, bloody stools ° °FOLLOW UP:  °If any biopsies were taken you will be contacted by phone or by letter within the next 1-3 weeks. Call [phone]  if you have not heard about the biopsies in 3 weeks.  °Please also call with any specific questions about appointments or follow up tests. ° °

## 2019-03-04 NOTE — H&P (Signed)
Date of Initial H&P: 02/15/19  History reviewed, patient examined, no change in status, stable for surgery.

## 2019-03-04 NOTE — Op Note (Signed)
[facility] Patient Name: Richard HawkLarry Sandoval Procedure Date: 03/04/2019 MRN: [id number] Attending MD: Shirley FriarVincent C Giannina Sandoval , MD Date of Birth: Dec 04, 1945 CSN: [id number] Age: 73 Admit Type: Inpatient Procedure:                Colonoscopy Indications:              High risk colon cancer surveillance: Personal                            history of colonic polyps, Last colonoscopy:                            November 2011 Providers:                Shirley FriarVincent C. Eriberto Felch, MD, Richard SarnaPatricia Ford, RN, Richard AlimentShayla                            Sandoval, Technician Referring MD:             Richard Sandoval Medicines:                Propofol per Anesthesia, Monitored Anesthesia Care Complications:            No immediate complications. Estimated Blood Loss:     Estimated blood loss: none. Procedure:                Pre-Anesthesia Assessment:                           - Prior to the procedure, a History and Physical                            was performed, and patient medications and                            allergies were reviewed. The patient's tolerance of                            previous anesthesia was also reviewed. The risks                            and benefits of the procedure and the sedation                            options and risks were discussed with the patient.                            All questions were answered, and informed consent                            was obtained. Prior Anticoagulants: The patient has                            taken Xarelto (rivaroxaban), last dose was 2 days  prior to procedure. ASA Grade Assessment: IV - A                            patient with severe systemic disease that is a                            constant threat to life. After reviewing the risks                            and benefits, the patient was deemed in                            satisfactory condition to undergo the procedure.  After obtaining informed consent, the colonoscope                            was passed under direct vision. Throughout the                            procedure, the patient's blood pressure, pulse, and                            oxygen saturations were monitored continuously. The                            PCF-[id number] ([id number] Olympus pediatric colonscope                            was introduced through the anus and advanced to the                            the cecum, identified by appendiceal orifice and                            ileocecal valve. The colonoscopy was extremely                            difficult due to significant looping, a tortuous                            colon, the patient's body habitus and fair prep.                            Successful completion of the procedure was aided by                            straightening and shortening the scope to obtain                            bowel loop reduction, using scope torsion, applying                            abdominal pressure and lavage. The patient  tolerated the procedure fairly well. The quality of                            the bowel preparation was fair. The ileocecal                            valve, appendiceal orifice, and rectum were                            photographed. Scope In: 10:53:40 AM Scope Out: 11:27:10 AM Scope Withdrawal Time: 0 hours 8 minutes 28 seconds  Total Procedure Duration: 0 hours 33 minutes 30 seconds  Findings:      The perianal and digital rectal examinations were normal.      Scattered small-mouthed diverticula were found in the sigmoid colon.      Internal hemorrhoids were found during retroflexion. The hemorrhoids       were small and Grade I (internal hemorrhoids that do not prolapse). Impression:               - Preparation of the colon was fair.                           - Diverticulosis in the sigmoid colon.                           -  Internal hemorrhoids.                           - No specimens collected. Moderate Sedation:      Not Applicable - Patient had care per Anesthesia. Recommendation:           - Patient has a contact number available for                            emergencies. The signs and symptoms of potential                            delayed complications were discussed with the                            patient. Return to normal activities tomorrow.                            Written discharge instructions were provided to the                            patient.                           - High fiber diet.                           - Resume Xarelto (rivaroxaban) at prior dose today.                           - Repeat colonoscopy is not recommended for  screening purposes. Procedure Code(s):        --- Professional ---                           [phone], Colonoscopy, flexible; diagnostic, including                            collection of specimen(s) by brushing or washing,                            when performed (separate procedure) Diagnosis Code(s):        --- Professional ---                           Z86.010, Personal history of colonic polyps                           K64.0, First degree hemorrhoids                           K57.30, Diverticulosis of large intestine without                            perforation or abscess without bleeding CPT copyright 2019 [employer]. All rights reserved. The codes documented in this report are preliminary and upon coder review may  be revised to meet current compliance requirements. Richard Ng, MD 03/04/2019 11:39:22 AM This report has been signed electronically. Number of Addenda: 0

## 2019-03-04 NOTE — Anesthesia Postprocedure Evaluation (Signed)
Anesthesia Post Note  Patient: Richard Sandoval  Procedure(s) Performed: COLONOSCOPY WITH PROPOFOL (N/A )     Patient location during evaluation: PACU Anesthesia Type: MAC Level of consciousness: awake and alert Pain management: pain level controlled Vital Signs Assessment: post-procedure vital signs reviewed and stable Respiratory status: spontaneous breathing, nonlabored ventilation and respiratory function stable Cardiovascular status: stable and blood pressure returned to baseline Anesthetic complications: no    Last Vitals:  Vitals:   03/04/19 1145 03/04/19 1155  BP: (!) 112/43 (!) 143/87  Pulse: 79 82  Resp: 18 19  Temp:    SpO2: 100% 98%    Last Pain:  Vitals:   03/04/19 1155  TempSrc:   PainSc: 0-No pain                 Audry Pili

## 2019-03-04 NOTE — Anesthesia Procedure Notes (Signed)
Procedure Name: MAC Date/Time: 03/04/2019 10:36 AM Performed by: Niel Hummer, CRNA Pre-anesthesia Checklist: Patient identified, Emergency Drugs available, Suction available and Patient being monitored Patient Re-evaluated:Patient Re-evaluated prior to induction Oxygen Delivery Method: Circle system utilized

## 2019-03-04 NOTE — Interval H&P Note (Signed)
History and Physical Interval Note:  03/04/2019 10:30 AM  Richard Sandoval  has presented today for surgery, with the diagnosis of History of colon polyps.  The various methods of treatment have been discussed with the patient and family. After consideration of risks, benefits and other options for treatment, the patient has consented to  Procedure(s): COLONOSCOPY WITH PROPOFOL (N/A) as a surgical intervention.  The patient's history has been reviewed, patient examined, no change in status, stable for surgery.  I have reviewed the patient's chart and labs.  Questions were answered to the patient's satisfaction.     Lear Ng

## 2019-03-07 ENCOUNTER — Encounter (HOSPITAL_COMMUNITY): Payer: Self-pay | Admitting: Gastroenterology

## 2019-07-01 ENCOUNTER — Encounter: Payer: Self-pay | Admitting: Cardiovascular Disease

## 2019-07-01 ENCOUNTER — Ambulatory Visit (INDEPENDENT_AMBULATORY_CARE_PROVIDER_SITE_OTHER): Payer: 59 | Admitting: Cardiovascular Disease

## 2019-07-01 ENCOUNTER — Other Ambulatory Visit: Payer: Self-pay

## 2019-07-01 VITALS — BP 126/70 | HR 68 | Temp 97.7°F | Ht 71.0 in | Wt >= 6400 oz

## 2019-07-01 DIAGNOSIS — Z13220 Encounter for screening for lipoid disorders: Secondary | ICD-10-CM

## 2019-07-01 DIAGNOSIS — Z5181 Encounter for therapeutic drug level monitoring: Secondary | ICD-10-CM | POA: Diagnosis not present

## 2019-07-01 DIAGNOSIS — I10 Essential (primary) hypertension: Secondary | ICD-10-CM

## 2019-07-01 DIAGNOSIS — I272 Pulmonary hypertension, unspecified: Secondary | ICD-10-CM

## 2019-07-01 NOTE — Progress Notes (Signed)
Cardiology Office Note   Date:  07/01/2019   ID:  Richard Sandoval, DOB January 08, 1946, MRN [id number]  PCP:  Harlan Stains, MD  Cardiologist:   Skeet Latch, MD   No chief complaint on file.    History of Present Illness: Richard Sandoval is a 73 y.o. male with prior pulmonary embolism, hypertension, morbid obesity, OSA on CPAP here for follow up.  He was initially seen 05/2017 for the evaluation of chronic dyspnea on exertion.  Richard Sandoval reports many years of shortness of breath.  This dates back to 2000 after he had surgery on his back.  Since that time he continues to have pain and has not been able to be very physically active.  He has noted progressive dyspnea on exertion.  In July 2017 he had a left lower extremity DVT and pulmonary embolism.  Richard Sandoval 02/21/16 that revealed LVEF 50-55% with grade 1 diastolic dysfunction.  PASP was 52 mmHg.  He thinks that the shortness of breath has gotten worse since that time.  He still has lower extremity edema that is worse in the left than the right.  This started after developing cellulitis in that leg.  He has OSA and uses CPAP nightly.  He never smoked but has been exposed to secondhand smoke.  Since his last appointment Richard Sandoval was referred for an Sandoval 06/11/17 that revealed LVEF 55-60% with normal diastolic function.  Richard Sandoval had lumbar decompression surgery 11/2017.  Lately he has been feeling well physically.  For the last couple days he has had pain in his right flank.  He thinks this may be due to muscle when he was trying to get out of bed.  He has been active in his yard and cleaning out his garage.  He has no exertional chest pain and his breathing is stable.  He is not not getting any formal exercise.  He continues to have left lower extremity edema that improves with the use of Lasix.  He denies orthopnea or PND.   Past Medical History:  Diagnosis Date  . DVT (deep venous thrombosis) (HCC)    L leg in 2009  . Dyspnea    . GERD (gastroesophageal reflux disease)   . Hypertension    enlarged heart  . Morbid obesity ([place])   . OSA (obstructive sleep apnea)    on CPAP since 1995  . Pancreatitis   . Pulmonary hypertension ([place]) 06/04/2017  . Right heart failure ([place]) 08/27/2017    Past Surgical History:  Procedure Laterality Date  . BACK SURGERY  04/09/1999  . cervical laminectomy and fusion    . CHOLECYSTECTOMY N/A 01/25/2014   Procedure: LAPAROSCOPIC CHOLECYSTECTOMY WITH INTRAOPERATIVE CHOLANGIOGRAM;  Surgeon: Earnstine Regal, MD;  Location: [facility];  Service: General;  Laterality: N/A;  . COLONOSCOPY    . COLONOSCOPY WITH PROPOFOL N/A 03/04/2019   Procedure: COLONOSCOPY WITH PROPOFOL;  Surgeon: Wilford Corner, MD;  Location: [facility];  Service: Endoscopy;  Laterality: N/A;  . EUS N/A 01/24/2014   Procedure: UPPER ENDOSCOPIC ULTRASOUND (EUS) RADIAL;  Surgeon: Arta Silence, MD;  [facility];  Service: Endoscopy;  Laterality: N/A;  . KNEE SURGERY  1998  . left inguinal hernia    . LUMBAR LAMINECTOMY    . TRIGGER FINGER RELEASE  08/04/2012   Procedure: MINOR RELEASE TRIGGER FINGER/A-1 PULLEY;  Surgeon: Wynonia Sours, MD;  Location: [place];  Service: Orthopedics;  Laterality: Right;     Current Outpatient  Medications  Medication Sig Dispense Refill  . furosemide (LASIX) 40 MG tablet Take 40 mg by mouth daily as needed (fluid retention.).     [name] gabapentin (NEURONTIN) 100 MG capsule Take 100 mg by mouth daily as needed (pain.).     [name] OXYGEN 2lpm  with CPAP  [facility]    . polyethylene glycol (MIRALAX / GLYCOLAX) packet Take 17 g by mouth daily as needed for mild constipation.    Carlena Hurl 15 MG TABS tablet Take 15 mg by mouth every evening. with food     No current facility-administered medications for this visit.     Allergies:   Penicillins    Social History:  The patient  reports that he has never smoked. He has never used smokeless tobacco. He reports that he does not drink  alcohol or use drugs.   Family History:  The patient's family history includes Breast cancer in his mother; Diabetes in his sister; Gout in his father; Heart disease in his brother; Hypertension in his father.    ROS:  Please see the history of present illness.   Otherwise, review of systems are positive for none.   All other systems are reviewed and negative.    PHYSICAL EXAM: VS:  BP 126/70   Pulse 68   Temp 97.7 F (36.5 C)   Ht 5\' 11"  (1.803 m)   Wt (!) 418 lb (189.6 kg)   SpO2 99%   BMI 58.30 kg/m  , BMI Body mass index is 58.3 kg/m. GENERAL:  Well appearing HEENT: Pupils equal round and reactive, fundi not visualized, oral mucosa unremarkable NECK:  No jugular venous distention, waveform within normal limits, carotid upstroke brisk and symmetric, no bruits, no thyromegaly LYMPHATICS:  No cervical adenopathy LUNGS:  Clear to auscultation bilaterally HEART:  RRR.  PMI not displaced or sustained,S1 and S2 within normal limits, no S3, no S4, no clicks, no rubs, no murmurs ABD:  Flat, positive bowel sounds normal in frequency in pitch, no bruits, no rebound, no guarding, no midline pulsatile mass, no hepatomegaly, no splenomegaly EXT:  2 plus pulses throughout, 2+ L LE edema.  No cyanosis no clubbing SKIN:  No rashes no nodules NEURO:  Cranial nerves II through XII grossly intact, motor grossly intact throughout PSYCH:  Cognitively intact, oriented to person place and time   EKG:  EKG is ordered today. The ekg ordered 06/04/17 demonstrates sinus rhythm.  Rate 68 bpm.  PACs.  LAFB.   04/20/18: Sinus rhythm.  Rate 78 bpm.  PVC.  Low voltage precordial leads.  LAFB 07/01/19: Sinus rhythm.  Rate 68 bpm.  PACs.     Recent Labs: No results found for requested labs within last 8760 hours.   02/09/17: WBC 5.1, hemoglobin 15, hematocrit 45.1, platelets 191 Sodium 139, potassium 4.5, BUN 18, creatinine 1.0 AST 23, ALT 15 Total cholesterol 165, triglycerides 47, HDL 37, LDL 119 next  TSH 02/11/17  Lipid Panel    Component Value Date/Time   TRIG 40 01/05/2014 1759      Wt Readings from Last 3 Encounters:  07/01/19 (!) 418 lb (189.6 kg)  03/04/19 (!) 409 lb (185.5 kg)  06/29/18 (!) 423 lb 12.8 oz (192.2 kg)      ASSESSMENT AND PLAN:  # Shortness of breath: # Morbid obesity:  Mr. 14/03/19 shortness of breath has improved but is still present with exertion.  Pulmonary pressures were better on his repeat Sandoval.  I suspect that this is mostly due to morbid  obesity and physical inactivity.  Encouraged him to get more exercise.   # LE edema: Lower extremity edema has improved with elevation and compression socks.  Some of this is probably due to prior DVT.  He was also sleeping in a recliner due to back pain.  Now that he is lying in bed the symptoms have improved and controlled when he can take lasix.  # Pulmonary hypertension: Pulmonary pressures were moderately elevated last year in the setting of having a pulmonary embolism.  This normalized on repeat but he has some RV failure.  This may lead to underestimating his pulmonary pressure.    # Hypertension: BP was initially elevated but much better on repeat.  He is deconditioned and BP increases with exercise.  # OSA: On CPAP  # Prior PE: On Xarelto.  Check CBC today.  # CV Disease Prevention:  Check lipids/CMP.   Current medicines are reviewed at length with the patient today.  The patient does not have concerns regarding medicines.  The following changes have been made: none  Labs/ tests ordered today include:   Orders Placed This Encounter  Procedures  . CBC w/Diff  . Lipid Profile  . Comprehensive Metabolic Panel (CMET)  . EKG 12-Lead     Disposition:   FU with Lorraine Terriquez C. Duke Salviaandolph, MD, [facility] in 1 year.     Signed, Min Collymore C. Duke Salviaandolph, MD, [facility]  07/01/2019 1:15 PM     [facility]

## 2019-07-01 NOTE — Patient Instructions (Signed)
Medication Instructions:  Your physician recommends that you continue on your current medications as directed. Please refer to the Current Medication list given to you today.  *If you need a refill on your cardiac medications before your next appointment, please call your pharmacy*  Lab Work: LP/CMET/CBC TODAY   If you have labs (blood work) drawn today and your tests are completely normal, you will receive your results only by: [name] MyChart Message (if you have MyChart) OR . A paper copy in the mail If you have any lab test that is abnormal or we need to change your treatment, we will call you to review the results.  Testing/Procedures: NONE   Follow-Up: At [facility], you and your health needs are our priority.  As part of our continuing mission to provide you with exceptional heart care, we have created designated Provider Care Teams.  These Care Teams include your primary Cardiologist (physician) and Advanced Practice Providers (APPs -  Physician Assistants and Nurse Practitioners) who all work together to provide you with the care you need, when you need it.  Your next appointment:   12 MONTHS You will receive a reminder letter in the mail two months in advance. If you don't receive a letter, please call our office to schedule the follow-up appointment.  The format for your next appointment:   Either In Person or Virtual  Provider:   You may see Skeet Latch, MD or one of the following Advanced Practice Providers on your designated Care Team:    Kerin Ransom, PA-C  [place], [place]  Coletta Memos, [place]

## 2019-07-02 LAB — COMPREHENSIVE METABOLIC PANEL
ALT: 16 IU/L (ref 0–44)
AST: 21 IU/L (ref 0–40)
Albumin/Globulin Ratio: 1.2 (ref 1.2–2.2)
Albumin: 3.8 g/dL (ref 3.7–4.7)
Alkaline Phosphatase: 77 IU/L (ref 39–117)
BUN/Creatinine Ratio: 14 (ref 10–24)
BUN: 14 mg/dL (ref 8–27)
Bilirubin Total: 0.8 mg/dL (ref 0.0–1.2)
CO2: 25 mmol/L (ref 20–29)
Calcium: 9.4 mg/dL (ref 8.6–10.2)
Chloride: 103 mmol/L (ref 96–106)
Creatinine, Ser: 1.02 mg/dL (ref 0.76–1.27)
GFR calc Af Amer: 84 mL/min/{1.73_m2} (ref >59)
GFR calc non Af Amer: 73 mL/min/{1.73_m2} (ref >59)
Globulin, Total: 3.3 g/dL (ref 1.5–4.5)
Glucose: 101 mg/dL — ABNORMAL HIGH (ref 65–99)
Potassium: 4.8 mmol/L (ref 3.5–5.2)
Sodium: 141 mmol/L (ref 134–144)
Total Protein: 7.1 g/dL (ref 6.0–8.5)

## 2019-07-02 LAB — CBC WITH DIFFERENTIAL/PLATELET
Basophils Absolute: 0 10*3/uL (ref 0.0–0.2)
Basos: 1 %
EOS (ABSOLUTE): 0.1 10*3/uL (ref 0.0–0.4)
Eos: 3 %
Hematocrit: 45.5 % (ref 37.5–51.0)
Hemoglobin: 15 g/dL (ref 13.0–17.7)
Immature Grans (Abs): 0 10*3/uL (ref 0.0–0.1)
Immature Granulocytes: 0 %
Lymphocytes Absolute: 1.5 10*3/uL (ref 0.7–3.1)
Lymphs: 32 %
MCH: 29.2 pg (ref 26.6–33.0)
MCHC: 33 g/dL (ref 31.5–35.7)
MCV: 89 fL (ref 79–97)
Monocytes Absolute: 0.5 10*3/uL (ref 0.1–0.9)
Monocytes: 11 %
Neutrophils Absolute: 2.4 10*3/uL (ref 1.4–7.0)
Neutrophils: 53 %
Platelets: 185 10*3/uL (ref 150–450)
RBC: 5.14 x10E6/uL (ref 4.14–5.80)
RDW: 13 % (ref 11.6–15.4)
WBC: 4.6 10*3/uL (ref 3.4–10.8)

## 2019-07-02 LAB — LIPID PANEL
Chol/HDL Ratio: 4 ratio (ref 0.0–5.0)
Cholesterol, Total: 170 mg/dL (ref 100–199)
HDL: 42 mg/dL (ref >39)
LDL Chol Calc (NIH): 117 mg/dL — ABNORMAL HIGH (ref 0–99)
Triglycerides: 54 mg/dL (ref 0–149)
VLDL Cholesterol Cal: 11 mg/dL (ref 5–40)

## 2019-07-07 ENCOUNTER — Other Ambulatory Visit: Payer: Self-pay

## 2019-07-07 ENCOUNTER — Emergency Department (HOSPITAL_COMMUNITY)
Admission: EM | Admit: 2019-07-07 | Discharge: 2019-07-08 | Disposition: A | Discharge status: Home / Self Care | Payer: No Typology Code available for payment source | Attending: Emergency Medicine | Admitting: Emergency Medicine

## 2019-07-07 ENCOUNTER — Encounter (HOSPITAL_COMMUNITY): Payer: Self-pay | Admitting: Emergency Medicine

## 2019-07-07 ENCOUNTER — Emergency Department (HOSPITAL_COMMUNITY): Payer: No Typology Code available for payment source

## 2019-07-07 DIAGNOSIS — Z86718 Personal history of other venous thrombosis and embolism: Secondary | ICD-10-CM | POA: Insufficient documentation

## 2019-07-07 DIAGNOSIS — I10 Essential (primary) hypertension: Secondary | ICD-10-CM | POA: Diagnosis not present

## 2019-07-07 DIAGNOSIS — R109 Unspecified abdominal pain: Secondary | ICD-10-CM | POA: Diagnosis not present

## 2019-07-07 DIAGNOSIS — R1031 Right lower quadrant pain: Secondary | ICD-10-CM | POA: Diagnosis not present

## 2019-07-07 DIAGNOSIS — R1011 Right upper quadrant pain: Secondary | ICD-10-CM | POA: Diagnosis not present

## 2019-07-07 DIAGNOSIS — R9431 Abnormal electrocardiogram [ECG] [EKG]: Secondary | ICD-10-CM | POA: Diagnosis not present

## 2019-07-07 DIAGNOSIS — R0602 Shortness of breath: Secondary | ICD-10-CM | POA: Diagnosis not present

## 2019-07-07 LAB — COMPREHENSIVE METABOLIC PANEL
ALT: 16 U/L (ref 0–44)
AST: 27 U/L (ref 15–41)
Albumin: 3.4 g/dL — ABNORMAL LOW (ref 3.5–5.0)
Alkaline Phosphatase: 65 U/L (ref 38–126)
Anion gap: 10 (ref 5–15)
BUN: 15 mg/dL (ref 8–23)
CO2: 26 mmol/L (ref 22–32)
Calcium: 9.2 mg/dL (ref 8.9–10.3)
Chloride: 104 mmol/L (ref 98–111)
Creatinine, Ser: 1.05 mg/dL (ref 0.61–1.24)
GFR calc Af Amer: >60 mL/min (ref >60)
GFR calc non Af Amer: >60 mL/min (ref >60)
Glucose, Bld: 98 mg/dL (ref 70–99)
Potassium: 4.1 mmol/L (ref 3.5–5.1)
Sodium: 140 mmol/L (ref 135–145)
Total Bilirubin: 1.1 mg/dL (ref 0.3–1.2)
Total Protein: 7.6 g/dL (ref 6.5–8.1)

## 2019-07-07 LAB — CBC
HCT: 46.2 % (ref 39.0–52.0)
Hemoglobin: 14.6 g/dL (ref 13.0–17.0)
MCH: 28.9 pg (ref 26.0–34.0)
MCHC: 31.6 g/dL (ref 30.0–36.0)
MCV: 91.5 fL (ref 80.0–100.0)
Platelets: 200 10*3/uL (ref 150–400)
RBC: 5.05 MIL/uL (ref 4.22–5.81)
RDW: 13.3 % (ref 11.5–15.5)
WBC: 6.3 10*3/uL (ref 4.0–10.5)
nRBC: 0 % (ref 0.0–0.2)

## 2019-07-07 LAB — LIPASE, BLOOD: Lipase: 16 U/L (ref 11–51)

## 2019-07-07 MED ORDER — OXYCODONE HCL 5 MG PO TABS
5.0000 mg | ORAL_TABLET | Freq: Once | ORAL | Status: AC
  Administered 2019-07-07: 23:00:00 5 mg via ORAL
  Filled 2019-07-07: qty 1

## 2019-07-07 MED ORDER — METHOCARBAMOL 500 MG PO TABS: 500.0000 mg | ORAL_TABLET | Freq: Three times a day (TID) | ORAL | 0 refills | Status: DC | PRN

## 2019-07-07 NOTE — ED Notes (Signed)
Patient out of room, has been transported to ct

## 2019-07-07 NOTE — ED Triage Notes (Signed)
Pt endorses right side pain and some SOB. Denies CP. Pain worse with movement. Pt on xarelto from previous clots.

## 2019-07-07 NOTE — Discharge Instructions (Signed)
You were evaluated in the Emergency Department and after careful evaluation, we did not find any emergent condition requiring admission or further testing in the hospital.  Your exam/testing today is overall reassuring.  Your symptoms seem to be due to strain or strain of the muscles of your back.  We recommend Tylenol at home for discomfort.  You can also use the Robaxin muscle relaxer as needed.  Please return to the Emergency Department if you experience any worsening of your condition.  We encourage you to follow up with a primary care provider.  Thank you for allowing [place] to be a part of your care.

## 2019-07-07 NOTE — ED Provider Notes (Signed)
[facility] Emergency Department Provider Note MRN:  [id number]  Arrival date & time: 07/07/19     Chief Complaint   Abdominal Pain   History of Present Illness   Richard Sandoval is a 73 y.o. year-old male with a history of PE presenting to the ED with chief complaint of abdominal pain.  Pain is located in the right flank and at times radiates to the right upper quadrant.  Pain is not present when he is standing or sitting still.  Worse when moving.  Denies abdominal pain, no chest pain, no shortness of breath, no fever, no cough, no dysuria, no hematuria.  Pain is moderate in severity, intermittent, no other exacerbating or alleviating factors.  Patient explains that he was picking up twigs and branches with his grandchildren few days ago and had to bend down often.  Review of Systems  A complete 10 system review of systems was obtained and all systems are negative except as noted in the HPI and PMH.   Patient's Health History    Past Medical History:  Diagnosis Date  . DVT (deep venous thrombosis) (HCC)    L leg in 2009  . Dyspnea   . GERD (gastroesophageal reflux disease)   . Hypertension    enlarged heart  . Morbid obesity ([place])   . OSA (obstructive sleep apnea)    on CPAP since 1995  . Pancreatitis   . Pulmonary hypertension ([place]) 06/04/2017  . Right heart failure ([place]) 08/27/2017    Past Surgical History:  Procedure Laterality Date  . BACK SURGERY  04/09/1999  . cervical laminectomy and fusion    . CHOLECYSTECTOMY N/A 01/25/2014   Procedure: LAPAROSCOPIC CHOLECYSTECTOMY WITH INTRAOPERATIVE CHOLANGIOGRAM;  Surgeon: Earnstine Regal, MD;  [facility];  Service: General;  Laterality: N/A;  . COLONOSCOPY    . COLONOSCOPY WITH PROPOFOL N/A 03/04/2019   Procedure: COLONOSCOPY WITH PROPOFOL;  Surgeon: Wilford Corner, MD;  [facility];  Service: Endoscopy;  Laterality: N/A;  . EUS N/A 01/24/2014   Procedure: UPPER ENDOSCOPIC ULTRASOUND (EUS)  RADIAL;  Surgeon: Arta Silence, MD;  [facility];  Service: Endoscopy;  Laterality: N/A;  . KNEE SURGERY  1998  . left inguinal hernia    . LUMBAR LAMINECTOMY    . TRIGGER FINGER RELEASE  08/04/2012   Procedure: MINOR RELEASE TRIGGER FINGER/A-1 PULLEY;  Surgeon: Wynonia Sours, MD;  Location: [place];  Service: Orthopedics;  Laterality: Right;    Family History  Problem Relation Age of Onset  . Breast cancer Mother   . Gout Father   . Hypertension Father   . Diabetes Sister   . Heart disease Brother     Social History   Socioeconomic History  . Marital status: Married    Spouse name: Evette  . Number of children: Not on file  . Years of education: Not on file  . Highest education level: Not on file  Occupational History  . Occupation: on disability.    Employer: RETIRED    Comment: prev worked for [employer], and at a [employer] plant- chemical exposure x 6 yrs  Tobacco Use  . Smoking status: Never Smoker  . Smokeless tobacco: Never Used  Substance and Sexual Activity  . Alcohol use: No  . Drug use: No  . Sexual activity: Yes  Other Topics Concern  . Not on file  Social History Narrative  . Not on file   Social Determinants of Health   Financial Resource  Strain:   . Difficulty of Paying Living Expenses: Not on file  Food Insecurity:   . Worried About Programme researcher, broadcasting/film/video in the Last Year: Not on file  . Ran Out of Food in the Last Year: Not on file  Transportation Needs:   . Lack of Transportation (Medical): Not on file  . Lack of Transportation (Non-Medical): Not on file  Physical Activity:   . Days of Exercise per Week: Not on file  . Minutes of Exercise per Session: Not on file  Stress:   . Feeling of Stress : Not on file  Social Connections:   . Frequency of Communication with Friends and Family: Not on file  . Frequency of Social Gatherings with Friends and Family: Not on file  . Attends Religious Services: Not on file  . Active Member  of Clubs or Organizations: Not on file  . Attends Banker Meetings: Not on file  . Marital Status: Not on file  Intimate Partner Violence:   . Fear of Current or Ex-Partner: Not on file  . Emotionally Abused: Not on file  . Physically Abused: Not on file  . Sexually Abused: Not on file     Physical Exam  Vital Signs and Nursing Notes reviewed Vitals:   07/07/19 1748 07/07/19 2053  BP: (!) 125/51 106/73  Pulse: (!) 106 93  Resp: 16 16  Temp: 97.9 F (36.6 C) 98.8 F (37.1 C)  SpO2: 100% 99%    CONSTITUTIONAL: Well-appearing, NAD, obese NEURO:  Alert and oriented x 3, no focal deficits EYES:  eyes equal and reactive ENT/NECK:  no LAD, no JVD CARDIO: Regular rate, well-perfused, normal S1 and S2 PULM:  CTAB no wheezing or rhonchi GI/GU:  normal bowel sounds, non-distended, non-tender MSK/SPINE:  No gross deformities, no edema SKIN:  no rash, atraumatic PSYCH:  Appropriate speech and behavior  Diagnostic and Interventional Summary    EKG Interpretation  Date/Time:  Thursday July 07 2019 17:53:48 EST Ventricular Rate:  103 PR Interval:    QRS Duration: 96 QT Interval:  360 QTC Calculation: 471 R Axis:   -29 Text Interpretation: Undetermined rhythm Cannot rule out Anterior infarct , age undetermined Abnormal ECG Confirmed by Kennis Carina [id number]) on 07/07/2019 10:12:59 PM      Labs Reviewed  COMPREHENSIVE METABOLIC PANEL - Abnormal; Notable for the following components:      Result Value   Albumin 3.4 (*)    All other components within normal limits  LIPASE, BLOOD  CBC    XR Chest 2 View    (Results Pending)  CT Renal Stone    (Results Pending)    Medications  oxyCODONE (Oxy IR/ROXICODONE) immediate release tablet 5 mg (has no administration in time range)     Procedures  /  Critical Care Procedures  ED Course and Medical Decision Making  I have reviewed the triage vital signs and the nursing notes.  Pertinent labs & imaging results that  were available during my care of the patient were reviewed by me and considered in my medical decision making (see below for details).     Suspect MSK etiology of patient's pain.  No red flag symptoms to suggest myelopathy.  Normal vital signs.  Pain is not pleuritic, it is low in the right flank and I doubt pulmonary embolism.  Also considering kidney stone, screening with chest x-ray and CT renal.  If unremarkable work-up, would discharge with muscle relaxer.  Signed out to oncoming provider at shift  change.    Elmer SowMichael M. Pilar PlateBero, MD [facility] [email or web address]  Final Clinical Impressions(s) / ED Diagnoses     ICD-10-CM   1. Flank pain  R10.9 XR Chest 2 View    XR Chest 2 View    ED Discharge Orders    None       Discharge Instructions Discussed with and Provided to Patient:   Discharge Instructions   None       Sabas SousBero, Ubaldo Daywalt M, MD 07/07/19 2227

## 2019-07-08 MED ORDER — METHOCARBAMOL 500 MG PO TABS: 500.0000 mg | ORAL_TABLET | Freq: Three times a day (TID) | ORAL | 0 refills | Status: DC | PRN

## 2019-07-11 DIAGNOSIS — R609 Edema, unspecified: Secondary | ICD-10-CM | POA: Diagnosis not present

## 2019-07-11 DIAGNOSIS — R109 Unspecified abdominal pain: Secondary | ICD-10-CM | POA: Diagnosis not present

## 2019-08-23 ENCOUNTER — Ambulatory Visit: Payer: Medicare Other | Attending: Internal Medicine

## 2019-08-23 DIAGNOSIS — Z20822 Contact with and (suspected) exposure to covid-19: Secondary | ICD-10-CM

## 2019-08-24 LAB — NOVEL CORONAVIRUS, NAA: SARS-CoV-2, NAA: NOT DETECTED

## 2019-09-13 DIAGNOSIS — J45909 Unspecified asthma, uncomplicated: Secondary | ICD-10-CM | POA: Diagnosis not present

## 2019-09-19 ENCOUNTER — Other Ambulatory Visit: Payer: Self-pay | Admitting: Family Medicine

## 2019-09-19 ENCOUNTER — Ambulatory Visit
Admission: RE | Admit: 2019-09-19 | Discharge: 2019-09-19 | Disposition: A | Discharge status: Home / Self Care | Payer: 59 | Source: Ambulatory Visit | Attending: Family Medicine | Admitting: Family Medicine

## 2019-09-19 DIAGNOSIS — R05 Cough: Secondary | ICD-10-CM | POA: Diagnosis not present

## 2019-09-19 DIAGNOSIS — R059 Cough, unspecified: Secondary | ICD-10-CM

## 2019-09-19 DIAGNOSIS — R0602 Shortness of breath: Secondary | ICD-10-CM | POA: Diagnosis not present

## 2019-10-10 DIAGNOSIS — G4733 Obstructive sleep apnea (adult) (pediatric): Secondary | ICD-10-CM | POA: Diagnosis not present

## 2019-10-10 DIAGNOSIS — R609 Edema, unspecified: Secondary | ICD-10-CM | POA: Diagnosis not present

## 2019-10-10 DIAGNOSIS — R05 Cough: Secondary | ICD-10-CM | POA: Diagnosis not present

## 2019-10-10 DIAGNOSIS — K219 Gastro-esophageal reflux disease without esophagitis: Secondary | ICD-10-CM | POA: Diagnosis not present

## 2019-10-10 DIAGNOSIS — M5136 Other intervertebral disc degeneration, lumbar region: Secondary | ICD-10-CM | POA: Diagnosis not present

## 2019-10-10 DIAGNOSIS — I5032 Chronic diastolic (congestive) heart failure: Secondary | ICD-10-CM | POA: Diagnosis not present

## 2019-10-10 DIAGNOSIS — I272 Pulmonary hypertension, unspecified: Secondary | ICD-10-CM | POA: Diagnosis not present

## 2019-10-10 DIAGNOSIS — Z86711 Personal history of pulmonary embolism: Secondary | ICD-10-CM | POA: Diagnosis not present

## 2019-10-10 DIAGNOSIS — F324 Major depressive disorder, single episode, in partial remission: Secondary | ICD-10-CM | POA: Diagnosis not present

## 2020-01-10 DIAGNOSIS — N489 Disorder of penis, unspecified: Secondary | ICD-10-CM | POA: Diagnosis not present

## 2020-01-10 DIAGNOSIS — G4733 Obstructive sleep apnea (adult) (pediatric): Secondary | ICD-10-CM | POA: Diagnosis not present

## 2020-01-10 DIAGNOSIS — R609 Edema, unspecified: Secondary | ICD-10-CM | POA: Diagnosis not present

## 2020-01-10 DIAGNOSIS — M25531 Pain in right wrist: Secondary | ICD-10-CM | POA: Diagnosis not present

## 2020-02-22 DIAGNOSIS — G4733 Obstructive sleep apnea (adult) (pediatric): Secondary | ICD-10-CM | POA: Diagnosis not present

## 2020-03-16 DIAGNOSIS — M654 Radial styloid tenosynovitis [de Quervain]: Secondary | ICD-10-CM | POA: Diagnosis not present

## 2020-03-16 DIAGNOSIS — M25531 Pain in right wrist: Secondary | ICD-10-CM | POA: Diagnosis not present

## 2020-03-19 DIAGNOSIS — R3912 Poor urinary stream: Secondary | ICD-10-CM | POA: Diagnosis not present

## 2020-03-19 DIAGNOSIS — R3915 Urgency of urination: Secondary | ICD-10-CM | POA: Diagnosis not present

## 2020-04-11 DIAGNOSIS — I872 Venous insufficiency (chronic) (peripheral): Secondary | ICD-10-CM | POA: Diagnosis not present

## 2020-04-11 DIAGNOSIS — R609 Edema, unspecified: Secondary | ICD-10-CM | POA: Diagnosis not present

## 2020-04-11 DIAGNOSIS — Z23 Encounter for immunization: Secondary | ICD-10-CM | POA: Diagnosis not present

## 2020-04-11 DIAGNOSIS — F324 Major depressive disorder, single episode, in partial remission: Secondary | ICD-10-CM | POA: Diagnosis not present

## 2020-05-25 DIAGNOSIS — R3912 Poor urinary stream: Secondary | ICD-10-CM | POA: Diagnosis not present

## 2020-05-25 DIAGNOSIS — R3915 Urgency of urination: Secondary | ICD-10-CM | POA: Diagnosis not present

## 2020-07-02 DIAGNOSIS — R609 Edema, unspecified: Secondary | ICD-10-CM | POA: Diagnosis not present

## 2020-07-02 DIAGNOSIS — M654 Radial styloid tenosynovitis [de Quervain]: Secondary | ICD-10-CM | POA: Diagnosis not present

## 2020-07-02 DIAGNOSIS — Z86711 Personal history of pulmonary embolism: Secondary | ICD-10-CM | POA: Diagnosis not present

## 2020-07-02 DIAGNOSIS — M5136 Other intervertebral disc degeneration, lumbar region: Secondary | ICD-10-CM | POA: Diagnosis not present

## 2020-07-02 DIAGNOSIS — I872 Venous insufficiency (chronic) (peripheral): Secondary | ICD-10-CM | POA: Diagnosis not present

## 2020-07-02 DIAGNOSIS — R059 Cough, unspecified: Secondary | ICD-10-CM | POA: Diagnosis not present

## 2020-07-12 ENCOUNTER — Other Ambulatory Visit: Payer: Self-pay

## 2020-07-12 ENCOUNTER — Ambulatory Visit (INDEPENDENT_AMBULATORY_CARE_PROVIDER_SITE_OTHER): Payer: No Typology Code available for payment source | Admitting: Cardiovascular Disease

## 2020-07-12 ENCOUNTER — Encounter: Payer: Self-pay | Admitting: Cardiovascular Disease

## 2020-07-12 VITALS — BP 126/80 | HR 73 | Ht 70.5 in | Wt >= 6400 oz

## 2020-07-12 DIAGNOSIS — I10 Essential (primary) hypertension: Secondary | ICD-10-CM | POA: Diagnosis not present

## 2020-07-12 DIAGNOSIS — I272 Pulmonary hypertension, unspecified: Secondary | ICD-10-CM

## 2020-07-12 DIAGNOSIS — Z5181 Encounter for therapeutic drug level monitoring: Secondary | ICD-10-CM

## 2020-07-12 DIAGNOSIS — G4733 Obstructive sleep apnea (adult) (pediatric): Secondary | ICD-10-CM

## 2020-07-12 DIAGNOSIS — I2699 Other pulmonary embolism without acute cor pulmonale: Secondary | ICD-10-CM

## 2020-07-12 NOTE — Patient Instructions (Addendum)
Medication Instructions:  Your physician recommends that you continue on your current medications as directed. Please refer to the Current Medication list given to you today.  *If you need a refill on your cardiac medications before your next appointment, please call your pharmacy*  Lab Work: FASTING LP/CMET SOON   If you have labs (blood work) drawn today and your tests are completely normal, you will receive your results only by: . MyChart Message (if you have MyChart) OR . A paper copy in the mail If you have any lab test that is abnormal or we need to change your treatment, we will call you to review the results.   Testing/Procedures: NONE  Follow-Up: At [facility], you and your health needs are our priority.  As part of our continuing mission to provide you with exceptional heart care, we have created designated Provider Care Teams.  These Care Teams include your primary Cardiologist (physician) and Advanced Practice Providers (APPs -  Physician Assistants and Nurse Practitioners) who all work together to provide you with the care you need, when you need it.  We recommend signing up for the patient portal called "MyChart".  Sign up information is provided on this After Visit Summary.  MyChart is used to connect with patients for Virtual Visits (Telemedicine).  Patients are able to view lab/test results, encounter notes, upcoming appointments, etc.  Non-urgent messages can be sent to your provider as well.   To learn more about what you can do with MyChart, go to [email or web address]://www.mychart.com.    Your next appointment:   12 month(s) You will receive a reminder letter in the mail two months in advance. If you don't receive a letter, please call our office to schedule the follow-up appointment.  The format for your next appointment:   In Person  Provider:   You may see Tiffany Lovington, MD or one of the following Advanced Practice Providers on your designated Care Team:    Luke  K[place], PA-C  Callie Goodrich, PA-C  Jesse Cleaver, FNP    

## 2020-07-12 NOTE — Progress Notes (Signed)
Cardiology Office Note   Date:  07/12/2020   ID:  Richard Sandoval, DOB 10-27-1945, MRN [id number]  PCP:  Laurann Montana, MD  Cardiologist:   Chilton Si, MD   No chief complaint on file.    History of Present Illness: Richard Sandoval is a 74 y.o. male with prior pulmonary embolism, hypertension, morbid obesity, OSA on CPAP here for follow up.  He was initially seen 05/2017 for the evaluation of chronic dyspnea on exertion.  Richard Sandoval reports many years of shortness of breath.  This dates back to 2000 after he had surgery on his back.  Since that time he continues to have pain and has not been able to be very physically active.  He has noted progressive dyspnea on exertion.  In July 2017 he had a left lower extremity DVT and pulmonary embolism.  Richard Sandoval had an echo 02/21/16 that revealed LVEF 50-55% with grade 1 diastolic dysfunction.  PASP was 52 mmHg.  He thinks that the shortness of breath has gotten worse since that time.  He still has lower extremity edema that is worse in the left than the right.  This started after developing cellulitis in that leg.  He has OSA and uses CPAP nightly.  He never smoked but has been exposed to secondhand smoke.  Since his last appointment Richard Sandoval was referred for an echo 06/11/17 that revealed LVEF 55-60% with normal diastolic function.  Richard Sandoval had lumbar decompression surgery 11/2017.  Since his last appointment he has been doing well.  He has no chest pain and his breathing has been stable.  His grandchildren keep him busy and he does yard work. He has to sit down and rest after every couple of minutes.  This is chronic and he has no chest pain. He notes that his appetite is decreasing and he is losing weight.  He has a lot of indigestion and gas.  He denies abdominal pain.  He doesn't take lasix often because it makes him have to go to the bathroom to often.  He denies orthopnea or PND.  His sster passed yesterday of sudden cardiac death.  She had  ESRD.  Past Medical History:  Diagnosis Date  . DVT (deep venous thrombosis) (HCC)    L leg in 2009  . Dyspnea   . GERD (gastroesophageal reflux disease)   . Hypertension    enlarged heart  . Morbid obesity (HCC)   . OSA (obstructive sleep apnea)    on CPAP since 1995  . Pancreatitis   . Pulmonary hypertension (HCC) 06/04/2017  . Right heart failure (HCC) 08/27/2017    Past Surgical History:  Procedure Laterality Date  . BACK SURGERY  04/09/1999  . cervical laminectomy and fusion    . CHOLECYSTECTOMY N/A 01/25/2014   Procedure: LAPAROSCOPIC CHOLECYSTECTOMY WITH INTRAOPERATIVE CHOLANGIOGRAM;  Surgeon: Velora Heckler, MD;  Location: [facility];  Service: General;  Laterality: N/A;  . COLONOSCOPY    . COLONOSCOPY WITH PROPOFOL N/A 03/04/2019   Procedure: COLONOSCOPY WITH PROPOFOL;  Surgeon: Charlott Rakes, MD;  Location: [facility];  Service: Endoscopy;  Laterality: N/A;  . EUS N/A 01/24/2014   Procedure: UPPER ENDOSCOPIC ULTRASOUND (EUS) RADIAL;  Surgeon: Willis Modena, MD;  Location: [facility];  Service: Endoscopy;  Laterality: N/A;  . KNEE SURGERY  1998  . left inguinal hernia    . LUMBAR LAMINECTOMY    . TRIGGER FINGER RELEASE  08/04/2012   Procedure: MINOR RELEASE TRIGGER FINGER/A-1 PULLEY;  Surgeon: Jillyn Hidden  Georges Lynch, MD;  Location: [facility];  Service: Orthopedics;  Laterality: Right;     Current Outpatient Medications  Medication Sig Dispense Refill  . furosemide (LASIX) 40 MG tablet Take 40 mg by mouth daily as needed (fluid retention.).     [name] gabapentin (NEURONTIN) 100 MG capsule Take 100 mg by mouth daily as needed (pain.).     [name] methocarbamol (ROBAXIN) 500 MG tablet Take 1 tablet (500 mg total) by mouth every 8 (eight) hours as needed for muscle spasms. 30 tablet 0  . OXYGEN 2lpm  with CPAP  AHC    . polyethylene glycol (MIRALAX / GLYCOLAX) packet Take 17 g by mouth daily as needed for mild constipation.    Carlena Hurl 15 MG TABS tablet Take 15 mg by mouth  every evening. with food     No current facility-administered medications for this visit.    Allergies:   Penicillins    Social History:  The patient  reports that he has never smoked. He has never used smokeless tobacco. He reports that he does not drink alcohol and does not use drugs.   Family History:  The patient's family history includes Breast cancer in his mother; Diabetes in his sister; Gout in his father; Heart disease in his brother; Hypertension in his father.    ROS:  Please see the history of present illness.   Otherwise, review of systems are positive for none.   All other systems are reviewed and negative.    PHYSICAL EXAM: VS:  BP 126/80   Pulse 73   Ht 5' 10.5" (1.791 m)   Wt (!) 412 lb (186.9 kg)   SpO2 99%   BMI 58.28 kg/m  , BMI Body mass index is 58.28 kg/m. GENERAL:  Well appearing HEENT: Pupils equal round and reactive, fundi not visualized, oral mucosa unremarkable NECK:  No jugular venous distention, waveform within normal limits, carotid upstroke brisk and symmetric, no bruits LUNGS:  Clear to auscultation bilaterally HEART:  RRR.  PMI not displaced or sustained,S1 and S2 within normal limits, no S3, no S4, no clicks, no rubs, no murmurs ABD:  Flat, positive bowel sounds normal in frequency in pitch, no bruits, no rebound, no guarding, no midline pulsatile mass, no hepatomegaly, no splenomegaly EXT:  2 plus pulses throughout, 2+ L LE edema.  No cyanosis no clubbing SKIN:  No rashes no nodules NEURO:  Cranial nerves II through XII grossly intact, motor grossly intact throughout PSYCH:  Cognitively intact, oriented to person place and time   EKG:  EKG is ordered today. The ekg ordered 06/04/17 demonstrates sinus rhythm.  Rate 68 bpm.  PACs.  LAFB.   04/20/18: Sinus rhythm.  Rate 78 bpm.  PVC.  Low voltage precordial leads.  LAFB 07/01/19: Sinus rhythm.  Rate 68 bpm.  PACs.   07/12/20: Sinus rhythm.  Rate  74 bpm.  PACs.  LAD.  Poor R wave  progression.   Recent Labs: No results found for requested labs within last 8760 hours.   02/09/17: WBC 5.1, hemoglobin 15, hematocrit 45.1, platelets 191 Sodium 139, potassium 4.5, BUN 18, creatinine 1.0 AST 23, ALT 15 Total cholesterol 165, triglycerides 47, HDL 37, LDL 119 next TSH 1.77  Lipid Panel    Component Value Date/Time   CHOL 170 07/01/2019 1220   TRIG 54 07/01/2019 1220   HDL 42 07/01/2019 1220   CHOLHDL 4.0 07/01/2019 1220   LDLCALC 117 (H) 07/01/2019 1220      Wt  Readings from Last 3 Encounters:  07/12/20 (!) 412 lb (186.9 kg)  07/07/19 (!) 418 lb (189.6 kg)  07/01/19 (!) 418 lb (189.6 kg)      ASSESSMENT AND PLAN:  # Shortness of breath: # Morbid obesity:  Richard Sandoval shortness of breath has improved but is still present with exertion.  Pulmonary pressures were better on his repeat echo.  I suspect that this is mostly due to morbid obesity and physical inactivity.  Encouraged him to continue trying to get more exercise.   # LE edema: Lower extremity edema has improved with elevation and compression socks.  Some of this is probably due to prior DVT.  He was also sleeping in a recliner due to back pain.  Now that he is lying in bed the symptoms have improved and controlled when he can take lasix.  Recommended he try taking it more regularly.  # Pulmonary hypertension: Pulmonary pressures were moderately elevated last year in the setting of having a pulmonary embolism.  This normalized on repeat but he has some RV failure.  This may lead to underestimating his pulmonary pressure.    # Hypertension: BP well-controlled.  # OSA: On CPAP  # Prior PE: On Xarelto.  Check CBC today.  # CV Disease Prevention:  Check lipids/CMP.   Current medicines are reviewed at length with the patient today.  The patient does not have concerns regarding medicines.  The following changes have been made: none  Labs/ tests ordered today include:   Orders Placed This  Encounter  Procedures  . Lipid panel  . Comprehensive metabolic panel  . EKG 12-Lead     Disposition:   FU with Kinsey Cowsert C. Duke Salvia, MD, [facility] in 1 year.     Signed, Kikuye Korenek C. Duke Salvia, MD, [facility]  07/12/2020 6:03 PM     [facility]

## 2021-02-20 DIAGNOSIS — G4733 Obstructive sleep apnea (adult) (pediatric): Secondary | ICD-10-CM | POA: Diagnosis not present

## 2021-03-17 DIAGNOSIS — R0902 Hypoxemia: Secondary | ICD-10-CM | POA: Diagnosis not present

## 2021-05-21 DIAGNOSIS — M25511 Pain in right shoulder: Secondary | ICD-10-CM | POA: Diagnosis not present

## 2021-05-27 DIAGNOSIS — M25511 Pain in right shoulder: Secondary | ICD-10-CM | POA: Diagnosis not present

## 2021-06-14 DIAGNOSIS — E785 Hyperlipidemia, unspecified: Secondary | ICD-10-CM | POA: Diagnosis not present

## 2021-06-14 DIAGNOSIS — M79672 Pain in left foot: Secondary | ICD-10-CM | POA: Diagnosis not present

## 2021-06-14 DIAGNOSIS — Z23 Encounter for immunization: Secondary | ICD-10-CM | POA: Diagnosis not present

## 2021-06-14 DIAGNOSIS — R609 Edema, unspecified: Secondary | ICD-10-CM | POA: Diagnosis not present

## 2021-06-14 DIAGNOSIS — M79671 Pain in right foot: Secondary | ICD-10-CM | POA: Diagnosis not present

## 2021-06-14 DIAGNOSIS — Z86711 Personal history of pulmonary embolism: Secondary | ICD-10-CM | POA: Diagnosis not present

## 2021-06-14 DIAGNOSIS — K5900 Constipation, unspecified: Secondary | ICD-10-CM | POA: Diagnosis not present

## 2021-06-14 DIAGNOSIS — M25511 Pain in right shoulder: Secondary | ICD-10-CM | POA: Diagnosis not present

## 2021-06-14 DIAGNOSIS — I272 Pulmonary hypertension, unspecified: Secondary | ICD-10-CM | POA: Diagnosis not present

## 2021-07-12 ENCOUNTER — Ambulatory Visit (INDEPENDENT_AMBULATORY_CARE_PROVIDER_SITE_OTHER): Payer: No Typology Code available for payment source | Admitting: Cardiovascular Disease

## 2021-07-12 ENCOUNTER — Other Ambulatory Visit: Payer: Self-pay

## 2021-07-12 ENCOUNTER — Encounter (HOSPITAL_BASED_OUTPATIENT_CLINIC_OR_DEPARTMENT_OTHER): Payer: Self-pay | Admitting: Cardiovascular Disease

## 2021-07-12 VITALS — BP 126/80 | HR 92 | Ht 70.5 in | Wt 382.2 lb

## 2021-07-12 DIAGNOSIS — I272 Pulmonary hypertension, unspecified: Secondary | ICD-10-CM

## 2021-07-12 DIAGNOSIS — R0609 Other forms of dyspnea: Secondary | ICD-10-CM | POA: Diagnosis not present

## 2021-07-12 DIAGNOSIS — G4733 Obstructive sleep apnea (adult) (pediatric): Secondary | ICD-10-CM

## 2021-07-12 DIAGNOSIS — Z01812 Encounter for preprocedural laboratory examination: Secondary | ICD-10-CM

## 2021-07-12 DIAGNOSIS — I2699 Other pulmonary embolism without acute cor pulmonale: Secondary | ICD-10-CM

## 2021-07-12 MED ORDER — METOPROLOL TARTRATE 100 MG PO TABS: ORAL_TABLET | ORAL | 0 refills | Status: DC

## 2021-07-12 NOTE — Assessment & Plan Note (Signed)
Continue Xarelto 

## 2021-07-12 NOTE — Progress Notes (Signed)
Cardiology Office Note   Date:  07/12/2021   ID:  Richard Sandoval, DOB 19-Mar-1946, MRN [id number]  PCP:  Harlan Stains, MD  Cardiologist:   Skeet Latch, MD   No chief complaint on file.    History of Present Illness: Richard Sandoval is a 75 y.o. male with prior pulmonary embolism, hypertension, morbid obesity, OSA on CPAP here for follow up.  He was initially seen 05/2017 for the evaluation of chronic dyspnea on exertion.  [name] reports many years of shortness of breath.  This dates back to 2000 after he had surgery on his back.  Since that time he continues to have pain and has not been able to be very physically active.  He has noted progressive dyspnea on exertion.  In July 2017 he had a left lower extremity DVT and pulmonary embolism.  [name] had an echo 02/21/16 that revealed LVEF 50-55% with grade 1 diastolic dysfunction.  PASP was 52 mmHg.  He thinks that the shortness of breath has gotten worse since that time.  He still has lower extremity edema that is worse in the left than the right.  This started after developing cellulitis in that leg.  He has OSA and uses CPAP nightly.  He never smoked but has been exposed to secondhand smoke.  Since his last appointment [name] was referred for an echo 06/11/17 that revealed LVEF 55-60% with normal diastolic function.  [name] had lumbar decompression surgery 11/2017.  At his last appointment he was doing well.  He had stable exertional dyspnea and lower extremity edema that with due to obesity and deconditioning as well as venous insufficiency.  He has been struggling with R shoulder pain.  He had an injection and it is getting a little better.  He has imaging upcoming to see if there is a tear.  Lately he has been losing weight.  He is eating less due to loss of appetite.  He also switch from tea to water.  Despite this he continues to have exertional dyspnea.  The symptoms seem to be getting worse despite weight loss.  He has  chronic lower extremity edema that is stable.  He has no orthopnea or PND.  He has been using his CPAP regularly.  Past Medical History:  Diagnosis Date   DVT (deep venous thrombosis) (HCC)    L leg in 2009   Dyspnea    GERD (gastroesophageal reflux disease)    Hypertension    enlarged heart   Morbid obesity ([place])    OSA (obstructive sleep apnea)    on CPAP since 1995   Pancreatitis    Pulmonary hypertension ([place]) 06/04/2017   Right heart failure ([place]) 08/27/2017    Past Surgical History:  Procedure Laterality Date   BACK SURGERY  04/09/1999   cervical laminectomy and fusion     CHOLECYSTECTOMY N/A 01/25/2014   Procedure: LAPAROSCOPIC CHOLECYSTECTOMY WITH INTRAOPERATIVE CHOLANGIOGRAM;  Surgeon: Earnstine Regal, MD;  Location: [facility];  Service: General;  Laterality: N/A;   COLONOSCOPY     COLONOSCOPY WITH PROPOFOL N/A 03/04/2019   Procedure: COLONOSCOPY WITH PROPOFOL;  Surgeon: Wilford Corner, MD;  [facility]: [facility];  Service: Endoscopy;  Laterality: N/A;   EUS N/A 01/24/2014   Procedure: UPPER ENDOSCOPIC ULTRASOUND (EUS) RADIAL;  Surgeon: Arta Silence, MD;  [facility];  Service: Endoscopy;  Laterality: N/A;   KNEE SURGERY  1998   left inguinal hernia     LUMBAR LAMINECTOMY  TRIGGER FINGER RELEASE  08/04/2012   Procedure: MINOR RELEASE TRIGGER FINGER/A-1 PULLEY;  Surgeon: Nicki Reaper, MD;  Location: [facility];  Service: Orthopedics;  Laterality: Right;     Current Outpatient Medications  Medication Sig Dispense Refill   furosemide (LASIX) 40 MG tablet Take 40 mg by mouth daily as needed (fluid retention.).      OXYGEN 2lpm  with CPAP  AHC     polyethylene glycol (MIRALAX / GLYCOLAX) packet Take 17 g by mouth daily as needed for mild constipation.     XARELTO 15 MG TABS tablet Take 15 mg by mouth every evening. with food     methocarbamol (ROBAXIN) 500 MG tablet Take 1 tablet (500 mg total) by mouth every 8 (eight) hours as needed for muscle  spasms. (Patient not taking: Reported on 07/12/2021) 30 tablet 0   No current facility-administered medications for this visit.    Allergies:   Penicillins    Social History:  The patient  reports that he has never smoked. He has never used smokeless tobacco. He reports that he does not drink alcohol and does not use drugs.   Family History:  The patient's family history includes Breast cancer in his mother; Diabetes in his sister; Gout in his father; Heart disease in his brother; Hypertension in his father.    ROS:  Please see the history of present illness.   Otherwise, review of systems are positive for none.   All other systems are reviewed and negative.    PHYSICAL EXAM: VS:  BP 126/80 (BP Location: Left Arm, Patient Position: Sitting, Cuff Size: Large)    Pulse 92    Ht 5' 10.5" (1.791 m)    Wt (!) 382 lb 3.2 oz (173.4 kg)    BMI 54.06 kg/m  , BMI Body mass index is 54.06 kg/m. GENERAL:  Well appearing HEENT: Pupils equal round and reactive, fundi not visualized, oral mucosa unremarkable NECK:  No jugular venous distention, waveform within normal limits, carotid upstroke brisk and symmetric, no bruits LUNGS:  Clear to auscultation bilaterally HEART:  RRR.  PMI not displaced or sustained,S1 and S2 within normal limits, no S3, no S4, no clicks, no rubs, no murmurs ABD:  Flat, positive bowel sounds normal in frequency in pitch, no bruits, no rebound, no guarding, no midline pulsatile mass, no hepatomegaly, no splenomegaly EXT:  2 plus pulses throughout, 2+ L LE edema to mid tibia bilaterally.  No cyanosis no clubbing SKIN:  No rashes no nodules NEURO:  Cranial nerves II through XII grossly intact, motor grossly intact throughout PSYCH:  Cognitively intact, oriented to person place and time   EKG:  EKG is ordered today. The ekg ordered 06/04/17 demonstrates sinus rhythm.  Rate 68 bpm.  PACs.  LAFB.   04/20/18: Sinus rhythm.  Rate 78 bpm.  PVC.  Low voltage precordial leads.   LAFB 07/01/19: Sinus rhythm.  Rate 68 bpm.  PACs.   07/12/20: Sinus rhythm.  Rate  74 bpm.  PACs.  LAD.  Poor R wave progression. 07/12/2021: Sinus rhythm.  Rate 92 bpm.  LAFB.   Recent Labs: No results found for requested labs within last 8760 hours.   02/09/17: WBC 5.1, hemoglobin 15, hematocrit 45.1, platelets 191 Sodium 139, potassium 4.5, BUN 18, creatinine 1.0 AST 23, ALT 15 Total cholesterol 165, triglycerides 47, HDL 37, LDL 119 next TSH 1.77  Lipid Panel    Component Value Date/Time   CHOL 170 07/01/2019 1220   TRIG  54 07/01/2019 1220   HDL 42 07/01/2019 1220   CHOLHDL 4.0 07/01/2019 1220   LDLCALC 117 (H) 07/01/2019 1220      Wt Readings from Last 3 Encounters:  07/12/21 (!) 382 lb 3.2 oz (173.4 kg)  07/12/20 (!) 412 lb (186.9 kg)  07/07/19 (!) 418 lb (189.6 kg)      ASSESSMENT AND PLAN:  Pulmonary hypertension (HCC) Pulmonary pressures were elevated after echo.  Lately he is been noticing increasing shortness of breath despite the fact that he has been losing weight.  He has chronic lower extremity edema that is stable.  We will get an echo to better assess.  Continue Lasix.  Dyspnea [name] continues to have exertional dyspnea.  He has lost nearly 60 pounds and continues to have dyspnea that seems to be getting worse.  I reviewed his chest CT from when he had a pulmonary embolism.  He did have some coronary calcification at that time.  We will get a coronary CTA to evaluate for obstructive coronary disease.  We will need to reevaluate his lipid goals based on these results.  He is unable to walk on a treadmill.  OSA (obstructive sleep apnea) Continue using CPAP.  Morbid (severe) obesity due to excess calories ([place]) We did discuss the PREP program at the [facility].  I do think that he would benefit.  Right now he struggling with discomfort in his shoulder and is not ready.  Bilateral pulmonary embolism (HCC) Continue Xarelto   Current medicines are reviewed  at length with the patient today.  The patient does not have concerns regarding medicines.  The following changes have been made: none  Labs/ tests ordered today include:   Orders Placed This Encounter  Procedures   CT CORONARY MORPH W/CTA COR W/SCORE W/CA W/CM &/OR WO/CM   Basic metabolic panel   EKG [id number]   ECHOCARDIOGRAM COMPLETE     Disposition:   FU with Rorey Bisson C. Oval Linsey, MD, [facility] in 2 months    Signed, Mahir Prabhakar C. Oval Linsey, MD, [facility]  07/12/2021 10:05 AM    [place]

## 2021-07-12 NOTE — Assessment & Plan Note (Signed)
We did discuss the PREP program at the [facility].  I do think that he would benefit.  Right now he struggling with discomfort in his shoulder and is not ready.

## 2021-07-12 NOTE — Assessment & Plan Note (Addendum)
[name] continues to have exertional dyspnea.  He has lost nearly 60 pounds and continues to have dyspnea that seems to be getting worse.  I reviewed his chest CT from when he had a pulmonary embolism.  He did have some coronary calcification at that time.  We will get a coronary CTA to evaluate for obstructive coronary disease.  We will need to reevaluate his lipid goals based on these results.  He is unable to walk on a treadmill.

## 2021-07-12 NOTE — Assessment & Plan Note (Signed)
Continue using CPAP

## 2021-07-12 NOTE — Assessment & Plan Note (Addendum)
Pulmonary pressures were elevated after echo.  Lately he is been noticing increasing shortness of breath despite the fact that he has been losing weight.  He has chronic lower extremity edema that is stable.  We will get an echo to better assess.  Continue Lasix.

## 2021-07-12 NOTE — Patient Instructions (Addendum)
Medication Instructions:  TAKE METOPROLOL 100 MG 1 TABLET 2 HOURS PRIOR TO CARDIAC CT   *If you need a refill on your cardiac medications before your next appointment, please call your pharmacy*   Lab Work: BMET 1 WEEK PRIOR TO CT   If you have labs (blood work) drawn today and your tests are completely normal, you will receive your results only by: MyChart Message (if you have MyChart) OR A paper copy in the mail If you have any lab test that is abnormal or we need to change your treatment, we will call you to review the results.  Testing/Procedures: Your physician has requested that you have cardiac CT. Cardiac computed tomography (CT) is a painless test that uses an x-ray machine to take clear, detailed pictures of your heart. For further information please visit [email or web address] Please follow instruction sheet as given.  Your physician has requested that you have an echocardiogram. Echocardiography is a painless test that uses sound waves to create images of your heart. It provides your doctor with information about the size and shape of your heart and how well your hearts chambers and valves are working. This procedure takes approximately one hour. There are no restrictions for this procedure.   Follow-Up: At [facility], you and your health needs are our priority.  As part of our continuing mission to provide you with exceptional heart care, we have created designated Provider Care Teams.  These Care Teams include your primary Cardiologist (physician) and Advanced Practice Providers (APPs -  Physician Assistants and Nurse Practitioners) who all work together to provide you with the care you need, when you need it.  We recommend signing up for the patient portal called "MyChart".  Sign up information is provided on this After Visit Summary.  MyChart is used to connect with patients for Virtual Visits (Telemedicine).  Patients are able to view lab/test results, encounter notes,  upcoming appointments, etc.  Non-urgent messages can be sent to your provider as well.   To learn more about what you can do with MyChart, go to [email or web address].    Your next appointment:   2 month(s)  The format for your next appointment:   In Person  Provider:   Chilton Si, MD   Other Instructions   Your cardiac CT will be scheduled at one of the below locations:   [facility] [address] [place], [place] [address] [phone]  OR  [facility] [address] [place], [place] [address] [phone]  If scheduled at [facility], please arrive at the [facility] main entrance (entrance A) of [facility] 30 minutes prior to test start time. You can use the FREE valet parking offered at the main entrance (encouraged to control the heart rate for the test) Proceed to the [facility] Radiology Department (first floor) to check-in and test prep.  If scheduled at [facility], please arrive 15 mins early for check-in and test prep.  Please follow these instructions carefully (unless otherwise directed):  Hold all erectile dysfunction medications at least 3 days (72 hrs) prior to test.  On the Night Before the Test: Be sure to Drink plenty of water. Do not consume any caffeinated/decaffeinated beverages or chocolate 12 hours prior to your test. Do not take any antihistamines 12 hours prior to your test. If the patient has contrast allergy: Patient will need a prescription for Prednisone and very clear instructions (as follows): Prednisone 50 mg -  take 13 hours prior to test Take another Prednisone 50 mg 7 hours prior to test Take another Prednisone 50 mg 1 hour prior to test Take Benadryl 50 mg 1 hour prior to test Patient must complete all four doses of above prophylactic medications. Patient will need a ride after test due to Benadryl.  On the Day of the  Test: Drink plenty of water until 1 hour prior to the test. Do not eat any food 4 hours prior to the test. You may take your regular medications prior to the test.  Take metoprolol (Lopressor) two hours prior to test. HOLD Furosemide/Hydrochlorothiazide morning of the test. FEMALES- please wear underwire-free bra if available, avoid dresses & tight clothing      After the Test: Drink plenty of water. After receiving IV contrast, you may experience a mild flushed feeling. This is normal. On occasion, you may experience a mild rash up to 24 hours after the test. This is not dangerous. If this occurs, you can take Benadryl 25 mg and increase your fluid intake. If you experience trouble breathing, this can be serious. If it is severe call 911 IMMEDIATELY. If it is mild, please call our office. If you take any of these medications: Glipizide/Metformin, Avandament, Glucavance, please do not take 48 hours after completing test unless otherwise instructed.  Please allow 2-4 weeks for scheduling of routine cardiac CTs. Some insurance companies require a pre-authorization which may delay scheduling of this test.   For non-scheduling related questions, please contact the cardiac imaging nurse navigator should you have any questions/concerns: Rockwell Alexandria, Cardiac Imaging Nurse Navigator Larey Brick, Cardiac Imaging Nurse Navigator [facility] Direct Office Dial: [phone]   For scheduling needs, including cancellations and rescheduling, please call [place], [phone].  Cardiac CT Angiogram A cardiac CT angiogram is a procedure to look at the heart and the area around the heart. It may be done to help find the cause of chest pains or other symptoms of heart disease. During this procedure, a substance called contrast dye is injected into the blood vessels in the area to be checked. A large X-ray machine, called a CT scanner, then takes detailed pictures of the heart  and the surrounding area. The procedure is also sometimes called a coronary CT angiogram, coronary artery scanning, or CTA. A cardiac CT angiogram allows the health care provider to see how well blood is flowing to and from the heart. The health care provider will be able to see if there are any problems, such as: Blockage or narrowing of the coronary arteries in the heart. Fluid around the heart. Signs of weakness or disease in the muscles, valves, and tissues of the heart. Tell a health care provider about: Any allergies you have. This is especially important if you have had a previous allergic reaction to contrast dye. All medicines you are taking, including vitamins, herbs, eye drops, creams, and over-the-counter medicines. Any blood disorders you have. Any surgeries you have had. Any medical conditions you have. Whether you are pregnant or may be pregnant. Any anxiety disorders, chronic pain, or other conditions you have that may increase your stress or prevent you from lying still. What are the risks? Generally, this is a safe procedure. However, problems may occur, including: Bleeding. Infection. Allergic reactions to medicines or dyes. Damage to other structures or organs. Kidney damage from the contrast dye that is used. Increased risk of cancer from radiation exposure. This risk is low. Talk with your health  care provider about: The risks and benefits of testing. How you can receive the lowest dose of radiation. What happens before the procedure? Wear comfortable clothing and remove any jewelry, glasses, dentures, and hearing aids. Follow instructions from your health care provider about eating and drinking. This may include: For 12 hours before the procedure -- avoid caffeine. This includes tea, coffee, soda, energy drinks, and diet pills. Drink plenty of water or other fluids that do not have caffeine in them. Being well hydrated can prevent complications. For 4-6 hours before  the procedure -- stop eating and drinking. The contrast dye can cause nausea, but this is less likely if your stomach is empty. Ask your health care provider about changing or stopping your regular medicines. This is especially important if you are taking diabetes medicines, blood thinners, or medicines to treat problems with erections (erectile dysfunction). What happens during the procedure?  Hair on your chest may need to be removed so that small sticky patches called electrodes can be placed on your chest. These will transmit information that helps to monitor your heart during the procedure. An IV will be inserted into one of your veins. You might be given a medicine to control your heart rate during the procedure. This will help to ensure that good images are obtained. You will be asked to lie on an exam table. This table will slide in and out of the CT machine during the procedure. Contrast dye will be injected into the IV. You might feel warm, or you may get a metallic taste in your mouth. You will be given a medicine called nitroglycerin. This will relax or dilate the arteries in your heart. The table that you are lying on will move into the CT machine tunnel for the scan. The person running the machine will give you instructions while the scans are being done. You may be asked to: Keep your arms above your head. Hold your breath. Stay very still, even if the table is moving. When the scanning is complete, you will be moved out of the machine. The IV will be removed. The procedure may vary among health care providers and hospitals. What can I expect after the procedure? After your procedure, it is common to have: A metallic taste in your mouth from the contrast dye. A feeling of warmth. A headache from the nitroglycerin. Follow these instructions at home: Take over-the-counter and prescription medicines only as told by your health care provider. If you are told, drink enough fluid to  keep your urine pale yellow. This will help to flush the contrast dye out of your body. Most people can return to their normal activities right after the procedure. Ask your health care provider what activities are safe for you. It is up to you to get the results of your procedure. Ask your health care provider, or the department that is doing the procedure, when your results will be ready. Keep all follow-up visits as told by your health care provider. This is important. Contact a health care provider if: You have any symptoms of allergy to the contrast dye. These include: Shortness of breath. Rash or hives. A racing heartbeat. Summary A cardiac CT angiogram is a procedure to look at the heart and the area around the heart. It may be done to help find the cause of chest pains or other symptoms of heart disease. During this procedure, a large X-ray machine, called a CT scanner, takes detailed pictures of the heart and the  surrounding area after a contrast dye has been injected into blood vessels in the area. Ask your health care provider about changing or stopping your regular medicines before the procedure. This is especially important if you are taking diabetes medicines, blood thinners, or medicines to treat erectile dysfunction. If you are told, drink enough fluid to keep your urine pale yellow. This will help to flush the contrast dye out of your body. This information is not intended to replace advice given to you by your health care provider. Make sure you discuss any questions you have with your health care provider. Document Revised: 03/27/2021 Document Reviewed: 03/09/2019 Elsevier Patient Education  2022 [employer].

## 2021-07-30 ENCOUNTER — Telehealth (HOSPITAL_COMMUNITY): Payer: Self-pay | Admitting: *Deleted

## 2021-07-30 DIAGNOSIS — Z01812 Encounter for preprocedural laboratory examination: Secondary | ICD-10-CM | POA: Diagnosis not present

## 2021-07-30 DIAGNOSIS — R0609 Other forms of dyspnea: Secondary | ICD-10-CM | POA: Diagnosis not present

## 2021-07-30 NOTE — Telephone Encounter (Signed)
Attempted to call patient regarding upcoming cardiac CT appointment and to remind patient that he will need labs prior to appointment. Left message on voicemail with name and callback number  Gordy Clement RN Navigator Cardiac [facility] [phone] Office [phone] Cell

## 2021-07-31 ENCOUNTER — Telehealth (HOSPITAL_COMMUNITY): Payer: Self-pay | Admitting: *Deleted

## 2021-07-31 ENCOUNTER — Other Ambulatory Visit: Payer: Self-pay

## 2021-07-31 ENCOUNTER — Ambulatory Visit (INDEPENDENT_AMBULATORY_CARE_PROVIDER_SITE_OTHER): Payer: No Typology Code available for payment source

## 2021-07-31 DIAGNOSIS — R0609 Other forms of dyspnea: Secondary | ICD-10-CM | POA: Diagnosis not present

## 2021-07-31 LAB — ECHOCARDIOGRAM COMPLETE
AR max vel: 1.65 cm2
AV Area VTI: 1.79 cm2
AV Area mean vel: 1.73 cm2
AV Mean grad: 4 mmHg
AV Peak grad: 8.1 mmHg
Ao pk vel: 1.42 m/s
Area-P 1/2: 3.08 cm2
S' Lateral: 4.97 cm

## 2021-07-31 LAB — BASIC METABOLIC PANEL
BUN/Creatinine Ratio: 18 (ref 10–24)
BUN: 19 mg/dL (ref 8–27)
CO2: 22 mmol/L (ref 20–29)
Calcium: 9.3 mg/dL (ref 8.6–10.2)
Chloride: 104 mmol/L (ref 96–106)
Creatinine, Ser: 1.06 mg/dL (ref 0.76–1.27)
Glucose: 96 mg/dL (ref 70–99)
Potassium: 4.9 mmol/L (ref 3.5–5.2)
Sodium: 142 mmol/L (ref 134–144)
eGFR: 74 mL/min/{1.73_m2} (ref >59)

## 2021-07-31 MED ORDER — PERFLUTREN LIPID MICROSPHERE
1.0000 mL | INTRAVENOUS | Status: AC | PRN
  Administered 2021-07-31: 3 mL via INTRAVENOUS

## 2021-07-31 NOTE — Telephone Encounter (Signed)
Attempted to call patient regarding upcoming cardiac CT appointment. °Left message on voicemail with name and callback number ° °Balthazar Dooly RN Navigator Cardiac Imaging °[facility] °[phone] Office °[phone] Cell ° °

## 2021-07-31 NOTE — Telephone Encounter (Signed)
Reaching out to patient to offer assistance regarding upcoming cardiac imaging study; pt verbalizes understanding of appt date/time, parking situation and where to check in, pre-test NPO status and medications ordered, and verified current allergies; name and call back number provided for further questions should they arise  Richard Tomich RN Navigator Cardiac Imaging [facility] [phone] office [phone] cell  Patient to take 100mg metoprolol tartrate two hours prior to cardiac CT scan. He is aware to arrive at 9am for his 9:30am scan. 

## 2021-08-01 ENCOUNTER — Ambulatory Visit (HOSPITAL_COMMUNITY)
Admission: RE | Admit: 2021-08-01 | Discharge: 2021-08-01 | Disposition: A | Discharge status: Home / Self Care | Payer: No Typology Code available for payment source | Source: Ambulatory Visit | Attending: Cardiovascular Disease | Admitting: Cardiovascular Disease

## 2021-08-01 ENCOUNTER — Encounter (HOSPITAL_COMMUNITY): Payer: Self-pay

## 2021-08-01 DIAGNOSIS — R0609 Other forms of dyspnea: Secondary | ICD-10-CM | POA: Diagnosis not present

## 2021-08-01 DIAGNOSIS — I2699 Other pulmonary embolism without acute cor pulmonale: Secondary | ICD-10-CM | POA: Insufficient documentation

## 2021-08-01 MED ORDER — IOHEXOL 350 MG/ML SOLN
100.0000 mL | Freq: Once | INTRAVENOUS | Status: AC | PRN
  Administered 2021-08-01: 100 mL via INTRAVENOUS

## 2021-08-01 MED ORDER — NITROGLYCERIN 0.4 MG SL SUBL
0.8000 mg | SUBLINGUAL_TABLET | Freq: Once | SUBLINGUAL | Status: AC
  Administered 2021-08-01: 0.8 mg via SUBLINGUAL

## 2021-08-01 MED ORDER — NITROGLYCERIN 0.4 MG SL SUBL
SUBLINGUAL_TABLET | SUBLINGUAL | Status: AC
  Filled 2021-08-01: qty 2

## 2021-08-01 NOTE — Progress Notes (Signed)
Patient tolerated CT well.  Vital signs stable encourage to drink water throughout day.Reasons explained and verbalized understanding.   

## 2021-08-02 ENCOUNTER — Ambulatory Visit (HOSPITAL_COMMUNITY)
Admission: RE | Admit: 2021-08-02 | Discharge: 2021-08-02 | Disposition: A | Discharge status: Home / Self Care | Payer: No Typology Code available for payment source | Source: Ambulatory Visit | Attending: Cardiology | Admitting: Cardiology

## 2021-08-02 ENCOUNTER — Other Ambulatory Visit (HOSPITAL_COMMUNITY): Payer: Self-pay | Admitting: Emergency Medicine

## 2021-08-02 DIAGNOSIS — R931 Abnormal findings on diagnostic imaging of heart and coronary circulation: Secondary | ICD-10-CM | POA: Insufficient documentation

## 2021-08-02 DIAGNOSIS — R079 Chest pain, unspecified: Secondary | ICD-10-CM

## 2021-09-04 ENCOUNTER — Telehealth (HOSPITAL_BASED_OUTPATIENT_CLINIC_OR_DEPARTMENT_OTHER): Payer: Self-pay | Admitting: *Deleted

## 2021-09-04 NOTE — Telephone Encounter (Signed)
-----   Message from Chilton Si, MD sent at 09/04/2021  1:19 PM EST ----- Echo shows that his heart pumping function is a little worse than before.  It is pumping 45 to 50% and should be greater than 55%.  This is new compared to his echo in 2018.  Can we add him onto my schedule Friday to discuss further in the office?

## 2021-09-04 NOTE — Telephone Encounter (Signed)
-----   Message from Chilton Si, MD sent at 09/04/2021  1:17 PM EST ----- Normal kidney function and electrolytes.

## 2021-09-04 NOTE — Telephone Encounter (Signed)
-----   Message from Chilton Si, MD sent at 09/04/2021  1:20 PM EST ----- Study shows that he has some plaque in his coronary arteries.  Recommend follow-up in the office on Friday to discuss next steps.

## 2021-09-04 NOTE — Telephone Encounter (Signed)
Left message to call back  

## 2021-09-04 NOTE — Telephone Encounter (Signed)
Left message to call back and scheduled appointment for 2/10 at 10:00 am

## 2021-09-06 ENCOUNTER — Other Ambulatory Visit: Payer: Self-pay

## 2021-09-06 ENCOUNTER — Ambulatory Visit (INDEPENDENT_AMBULATORY_CARE_PROVIDER_SITE_OTHER): Payer: No Typology Code available for payment source | Admitting: Cardiovascular Disease

## 2021-09-06 ENCOUNTER — Encounter (HOSPITAL_BASED_OUTPATIENT_CLINIC_OR_DEPARTMENT_OTHER): Payer: Self-pay | Admitting: Cardiovascular Disease

## 2021-09-06 VITALS — BP 124/72 | HR 71 | Ht 70.5 in | Wt 384.4 lb

## 2021-09-06 DIAGNOSIS — G4733 Obstructive sleep apnea (adult) (pediatric): Secondary | ICD-10-CM | POA: Diagnosis not present

## 2021-09-06 DIAGNOSIS — I2510 Atherosclerotic heart disease of native coronary artery without angina pectoris: Secondary | ICD-10-CM | POA: Diagnosis not present

## 2021-09-06 DIAGNOSIS — I5042 Chronic combined systolic (congestive) and diastolic (congestive) heart failure: Secondary | ICD-10-CM

## 2021-09-06 DIAGNOSIS — Z01812 Encounter for preprocedural laboratory examination: Secondary | ICD-10-CM

## 2021-09-06 DIAGNOSIS — I5043 Acute on chronic combined systolic (congestive) and diastolic (congestive) heart failure: Secondary | ICD-10-CM

## 2021-09-06 HISTORY — DX: Acute on chronic combined systolic (congestive) and diastolic (congestive) heart failure: I50.43

## 2021-09-06 HISTORY — DX: Chronic combined systolic (congestive) and diastolic (congestive) heart failure: I50.42

## 2021-09-06 HISTORY — DX: Atherosclerotic heart disease of native coronary artery without angina pectoris: I25.10

## 2021-09-06 LAB — BASIC METABOLIC PANEL
BUN/Creatinine Ratio: 14 (ref 10–24)
BUN: 14 mg/dL (ref 8–27)
CO2: 24 mmol/L (ref 20–29)
Calcium: 9.4 mg/dL (ref 8.6–10.2)
Chloride: 104 mmol/L (ref 96–106)
Creatinine, Ser: 0.97 mg/dL (ref 0.76–1.27)
Glucose: 102 mg/dL — ABNORMAL HIGH (ref 70–99)
Potassium: 4.6 mmol/L (ref 3.5–5.2)
Sodium: 140 mmol/L (ref 134–144)
eGFR: 82 mL/min/{1.73_m2} (ref >59)

## 2021-09-06 LAB — CBC WITH DIFFERENTIAL/PLATELET
Basophils Absolute: 0 10*3/uL (ref 0.0–0.2)
Basos: 1 %
EOS (ABSOLUTE): 0.1 10*3/uL (ref 0.0–0.4)
Eos: 2 %
Hematocrit: 45.4 % (ref 37.5–51.0)
Hemoglobin: 15.3 g/dL (ref 13.0–17.7)
Immature Grans (Abs): 0 10*3/uL (ref 0.0–0.1)
Immature Granulocytes: 0 %
Lymphocytes Absolute: 1.6 10*3/uL (ref 0.7–3.1)
Lymphs: 35 %
MCH: 29.8 pg (ref 26.6–33.0)
MCHC: 33.7 g/dL (ref 31.5–35.7)
MCV: 88 fL (ref 79–97)
Monocytes Absolute: 0.5 10*3/uL (ref 0.1–0.9)
Monocytes: 11 %
Neutrophils Absolute: 2.3 10*3/uL (ref 1.4–7.0)
Neutrophils: 51 %
Platelets: 184 10*3/uL (ref 150–450)
RBC: 5.14 x10E6/uL (ref 4.14–5.80)
RDW: 12.8 % (ref 11.6–15.4)
WBC: 4.5 10*3/uL (ref 3.4–10.8)

## 2021-09-06 MED ORDER — FUROSEMIDE 40 MG PO TABS: 40.0000 mg | ORAL_TABLET | Freq: Every day | ORAL | 3 refills | Status: DC

## 2021-09-06 MED ORDER — ROSUVASTATIN CALCIUM 20 MG PO TABS: 20.0000 mg | ORAL_TABLET | Freq: Every day | ORAL | 3 refills | Status: DC

## 2021-09-06 NOTE — Assessment & Plan Note (Signed)
Continue CPAP.  

## 2021-09-06 NOTE — Assessment & Plan Note (Signed)
Coronary CT-A was concerning for LM and LAD stenoses.  Referring for cath as above.

## 2021-09-06 NOTE — Progress Notes (Signed)
Cardiology Office Note   Date:  09/06/2021   ID:  Richard, Sandoval 1946-01-12, MRN [id number]  PCP:  Harlan Stains, MD  Cardiologist:   Skeet Latch, MD   No chief complaint on file.    History of Present Illness: Richard Sandoval is a 76 y.o. male with prior pulmonary embolism, hypertension, morbid obesity, OSA on CPAP here for follow up.  He was initially seen 05/2017 for the evaluation of chronic dyspnea on exertion.  [name] reports many years of shortness of breath.  This dates back to 2000 after he had surgery on his back.  Since that time he continues to have pain and has not been able to be very physically active.  He has noted progressive dyspnea on exertion.  In July 2017 he had a left lower extremity DVT and pulmonary embolism.  [name] had an echo 02/21/16 that revealed LVEF 50-55% with grade 1 diastolic dysfunction.  PASP was 52 mmHg.  He thinks that the shortness of breath has gotten worse since that time.  He still has lower extremity edema that is worse in the left than the right.  This started after developing cellulitis in that leg.  He has OSA and uses CPAP nightly.  He never smoked but has been exposed to secondhand smoke.  Since his last appointment [name] was referred for an echo 06/11/17 that revealed LVEF 55-60% with normal diastolic function.  [name] had lumbar decompression surgery 11/2017.  At his last appointment he was doing well.  He reported success with losing weight (60 pounds) but continued to have exertional dyspnea that seem to be getting worse.  He had an echo 07/2021 that revealed LVEF 45 to 50% and grade 1 diastolic dysfunction.  His right ventricular function and pressure were normal.  Ascending aorta was mildly dilated at 4.3 cm.  He had a coronary CTA that was technically difficult.  However there was concern for moderate mid LAD stenosis and 50 to 60% left main stenosis.  There is also mild proximal RCA stenosis.  The study was not high  enough quality for FFR analysis.  He has noted that the toes on his L foot have been darker.  He also gets pain in his feet when he first stands in the AM.  He denies claudication.  He has increased LE edema but no orthopnea or PND.  He hasn't been getting much exercise but is continuing to limit his portions.  He continues to note exertional dyspnea that makes it hard for him to exercise.  He denies chest pain or pressure.  I have her answer in the OR on accident now I see CV  Past Medical History:  Diagnosis Date   Acute on chronic combined systolic and diastolic CHF (congestive heart failure) ([place]) 09/06/2021   CAD in native artery 09/06/2021   DVT (deep venous thrombosis) (HCC)    L leg in 2009   Dyspnea    GERD (gastroesophageal reflux disease)    Hypertension    enlarged heart   Morbid obesity ([place])    OSA (obstructive sleep apnea)    on CPAP since 1995   Pancreatitis    Pulmonary hypertension ([place]) 06/04/2017   Right heart failure ([place]) 08/27/2017    Past Surgical History:  Procedure Laterality Date   BACK SURGERY  04/09/1999   cervical laminectomy and fusion     CHOLECYSTECTOMY N/A 01/25/2014   Procedure: LAPAROSCOPIC CHOLECYSTECTOMY WITH INTRAOPERATIVE CHOLANGIOGRAM;  Surgeon: Earnstine Regal,  MD;  [facility]: [facility];  Service: General;  Laterality: N/A;  ° COLONOSCOPY    ° COLONOSCOPY WITH PROPOFOL N/A 03/04/2019  ° Procedure: COLONOSCOPY WITH PROPOFOL;  Surgeon: Schooler, Vincent, MD;  [facility];  Service: Endoscopy;  Laterality: N/A;  ° EUS N/A 01/24/2014  ° Procedure: UPPER ENDOSCOPIC ULTRASOUND (EUS) RADIAL;  Surgeon: William [name], MD;  [facility];  Service: Endoscopy;  Laterality: N/A;  ° KNEE SURGERY  1998  ° left inguinal hernia    ° LUMBAR LAMINECTOMY    ° TRIGGER FINGER RELEASE  08/04/2012  ° Procedure: MINOR RELEASE TRIGGER FINGER/A-1 PULLEY;  Surgeon: Gary R Kuzma, MD;  Location: [facility];  Service: Orthopedics;  Laterality: Right;   ° ° ° °Current Outpatient Medications  °Medication Sig Dispense Refill  ° polyethylene glycol (MIRALAX / GLYCOLAX) packet Take 17 g by mouth daily as needed for mild constipation.    ° rosuvastatin (CRESTOR) 20 MG tablet Take 1 tablet (20 mg total) by mouth daily. 90 tablet 3  ° XARELTO 15 MG TABS tablet Take 15 mg by mouth every evening. with food    ° furosemide (LASIX) 40 MG tablet Take 1 tablet (40 mg total) by mouth daily. 90 tablet 3  ° °No current facility-administered medications for this visit.  ° ° °Allergies:   Penicillins  ° ° °Social History:  The patient  reports that he has never smoked. He has never used smokeless tobacco. He reports that he does not drink alcohol and does not use drugs.  ° °Family History:  The patient's family history includes Breast cancer in his mother; Diabetes in his sister; Gout in his father; Heart disease in his brother; Hypertension in his father.  ° ° °ROS:  Please see the history of present illness.   Otherwise, review of systems are positive for none.   All other systems are reviewed and negative.  ° ° °PHYSICAL EXAM: °VS:  BP 124/72 (BP Location: Left Arm, Patient Position: Sitting, Cuff Size: Large)    Pulse 71    Ht 5' 10.5" (1.791 m)    Wt (!) 384 lb 6.4 oz (174.4 kg)    SpO2 99%    BMI 54.38 kg/m²  , BMI Body mass index is 54.38 kg/m². °GENERAL:  Well appearing °HEENT: Pupils equal round and reactive, fundi not visualized, oral mucosa unremarkable °NECK:  No jugular venous distention, waveform within normal limits, carotid upstroke brisk and symmetric, no bruits °LUNGS:  Clear to auscultation bilaterally °HEART:  RRR.  PMI not displaced or sustained,S1 and S2 within normal limits, no S3, no S4, no clicks, no rubs, no murmurs °ABD:  Flat, positive bowel sounds normal in frequency in pitch, no bruits, no rebound, no guarding, no midline pulsatile mass, no hepatomegaly, no splenomegaly °EXT:  2 plus pulses throughout, 2+ L LE edema to above the ankle bilaterally.  No  cyanosis no clubbing °SKIN: Hyperpigmentation of the left foot and left anterior tibia °NEURO:  Cranial nerves II through XII grossly intact, motor grossly intact throughout °PSYCH:  Cognitively intact, oriented to person place and time ° ° °EKG:  EKG is ordered today. °The ekg ordered 06/04/17 demonstrates sinus rhythm.  Rate 68 bpm.  PACs.  LAFB.   °04/20/18: Sinus rhythm.  Rate 78 bpm.  PVC.  Low voltage precordial leads.  LAFB °07/01/19: Sinus rhythm.  Rate 68 bpm.  PACs.   °07/12/20: Sinus rhythm.  Rate  74 bpm.  PACs.  LAD.  Poor R wave   progression. 07/12/2021: Sinus rhythm.  Rate 92 bpm.  LAFB.  Echo 07/31/21: 1. Left ventricular ejection fraction, by estimation, is 45 to 50%. The  left ventricle has mildly decreased function. Left ventricular endocardial  border not optimally defined to evaluate regional wall motion. The left  ventricular internal cavity size  was mildly dilated. Left ventricular diastolic parameters are consistent  with Grade I diastolic dysfunction (impaired relaxation).   2. Right ventricular systolic function is normal. The right ventricular  size is normal. There is normal pulmonary artery systolic pressure.   3. Left atrial size was moderately dilated.   4. The mitral valve was not well visualized. No evidence of mitral valve  regurgitation. No evidence of mitral stenosis.   5. The aortic valve was not well visualized. Aortic valve regurgitation  is not visualized. No aortic stenosis is present.   6. Aortic dilatation noted. There is mild dilatation of the ascending  aorta, measuring 43 mm.   Coronary CT-A 08/01/21: IMPRESSION: 1. Coronary artery calcium score 1476 Agatston units. This places the patient in the 86th percentile for age and gender, suggesting high risk for future cardiac events.   2. Technically difficult study significantly affected by motion artifact. Quality of study too low for FFR analysis. The LCx and RCA do not appear to have signficant  stenoses. The left main and proximal LAD may have moderate stenoses. Would suggest cardiolite to assess for ischemia.  Recent Labs: 07/30/2021: BUN 19; Creatinine, Ser 1.06; Potassium 4.9; Sodium 142   02/09/17: WBC 5.1, hemoglobin 15, hematocrit 45.1, platelets 191 Sodium 139, potassium 4.5, BUN 18, creatinine 1.0 AST 23, ALT 15 Total cholesterol 165, triglycerides 47, HDL 37, LDL 119 next TSH 1.77  Lipid Panel    Component Value Date/Time   CHOL 170 07/01/2019 1220   TRIG 54 07/01/2019 1220   HDL 42 07/01/2019 1220   CHOLHDL 4.0 07/01/2019 1220   LDLCALC 117 (H) 07/01/2019 1220      Wt Readings from Last 3 Encounters:  09/06/21 (!) 384 lb 6.4 oz (174.4 kg)  07/12/21 (!) 382 lb 3.2 oz (173.4 kg)  07/12/20 (!) 412 lb (186.9 kg)      ASSESSMENT AND PLAN:  Right heart failure (HCC) Primary heart failure in the setting of pulmonary embolism.  Right heart function was much better on his most recent echo and his pulmonary pressures were normal.  We will get a right heart cath when he gets his left heart cath.  He does have some lower extremity edema and it is unclear how much of it is due to heart failure versus venous insufficiency.  He also has not been very compliant with his Lasix because it makes him up to go the bathroom so much when he goes out.  OSA (obstructive sleep apnea) Continue CPAP.  Acute on chronic combined systolic and diastolic CHF (congestive heart failure) (HCC) LVEF is 40 to 45%.  He also has exertional dyspnea and lower extremity edema.  He is going to start back taking his Lasix every day.  We will also get a left and right heart cath to evaluate for obstructive coronary disease.  He did have left main and LAD disease on his CT that looked at least moderate.  However it was not a sufficient study to send for FFR.  He is agreeable with cath.  We will have him hold his Xarelto 2 days prior to the procedure.  We will add rosuvastatin 20 mg daily and repeat lipids  and  a CMP in 2 to 3 months.  Risks and benefits of cardiac catheterization have been discussed with the patient.  The patient understands that risks included but are not limited to stroke (1 in 1000), death (1 in 90), kidney failure [usually temporary] (1 in 500), bleeding (1 in 200), allergic reaction [possibly serious] (1 in 200). The patient understands and agrees to proceed.    CAD in native artery Coronary CT-A was concerning for LM and LAD stenoses.  Referring for cath as above.     Current medicines are reviewed at length with the patient today.  The patient does not have concerns regarding medicines.  The following changes have been made: none  Labs/ tests ordered today include:   Orders Placed This Encounter  Procedures   CBC with Differential/Platelet   Basic metabolic panel     Disposition:   FU with Meleni Delahunt C. Oval Linsey, MD, [facility] in 2 months    Signed, Pike Scantlebury C. Oval Linsey, MD, [facility]  09/06/2021 12:19 PM    [place]

## 2021-09-06 NOTE — Assessment & Plan Note (Signed)
Primary heart failure in the setting of pulmonary embolism.  Right heart function was much better on his most recent echo and his pulmonary pressures were normal.  We will get a right heart cath when he gets his left heart cath.  He does have some lower extremity edema and it is unclear how much of it is due to heart failure versus venous insufficiency.  He also has not been very compliant with his Lasix because it makes him up to go the bathroom so much when he goes out.

## 2021-09-06 NOTE — H&P (View-Only) (Signed)
Cardiology Office Note   Date:  09/06/2021   ID:  Richard Sandoval [birth year], MRN [id number]  PCP:  Richard Stains, MD  Cardiologist:   Richard Latch, MD   No chief complaint on file.    History of Present Illness: Richard Sandoval is a 76 y.o. male with prior pulmonary embolism, hypertension, morbid obesity, OSA on CPAP here for follow up.  He was initially seen 05/2017 for the evaluation of chronic dyspnea on exertion.  Richard Sandoval reports many years of shortness of breath.  This dates back to 2000 after he had surgery on his back.  Since that time he continues to have pain and has not been able to be very physically active.  He has noted progressive dyspnea on exertion.  In July 2017 he had a left lower extremity DVT and pulmonary embolism.  Richard Sandoval had an echo 02/21/16 that revealed LVEF 50-55% with grade 1 diastolic dysfunction.  PASP was 52 mmHg.  He thinks that the shortness of breath has gotten worse since that time.  He still has lower extremity edema that is worse in the left than the right.  This started after developing cellulitis in that leg.  He has OSA and uses CPAP nightly.  He never smoked but has been exposed to secondhand smoke.  Since his last appointment Richard Sandoval was referred for an echo 06/11/17 that revealed LVEF 55-60% with normal diastolic function.  Richard Sandoval had lumbar decompression surgery 11/2017.  At his last appointment he was doing well.  He reported success with losing weight (60 pounds) but continued to have exertional dyspnea that seem to be getting worse.  He had an echo 07/2021 that revealed LVEF 45 to 50% and grade 1 diastolic dysfunction.  His right ventricular function and pressure were normal.  Ascending aorta was mildly dilated at 4.3 cm.  He had a coronary CTA that was technically difficult.  However there was concern for moderate mid LAD stenosis and 50 to 60% left main stenosis.  There is also mild proximal RCA stenosis.  The study was not high  enough quality for FFR analysis.  He has noted that the toes on his L foot have been darker.  He also gets pain in his feet when he first stands in the AM.  He denies claudication.  He has increased LE edema but no orthopnea or PND.  He hasn't been getting much exercise but is continuing to limit his portions.  He continues to note exertional dyspnea that makes it hard for him to exercise.  He denies chest pain or pressure.  I have her answer in the OR on accident now I see CV  Past Medical History:  Diagnosis Date   Acute on chronic combined systolic and diastolic CHF (congestive heart failure) ([place]) 09/06/2021   CAD in native artery 09/06/2021   DVT (deep venous thrombosis) (HCC)    L leg in 2009   Dyspnea    GERD (gastroesophageal reflux disease)    Hypertension    enlarged heart   Morbid obesity ([place])    OSA (obstructive sleep apnea)    on CPAP since 1995   Pancreatitis    Pulmonary hypertension ([place]) 06/04/2017   Right heart failure ([place]) 08/27/2017    Past Surgical History:  Procedure Laterality Date   BACK SURGERY  04/09/1999   cervical laminectomy and fusion     CHOLECYSTECTOMY N/A 01/25/2014   Procedure: LAPAROSCOPIC CHOLECYSTECTOMY WITH INTRAOPERATIVE CHOLANGIOGRAM;  Surgeon: Richard Regal,  MD;  [facility];  Service: General;  Laterality: N/A;   COLONOSCOPY     COLONOSCOPY WITH PROPOFOL N/A 03/04/2019   Procedure: COLONOSCOPY WITH PROPOFOL;  Surgeon: Richard Corner, MD;  [facility];  Service: Endoscopy;  Laterality: N/A;   EUS N/A 01/24/2014   Procedure: UPPER ENDOSCOPIC ULTRASOUND (EUS) RADIAL;  Surgeon: Richard Silence, MD;  [facility];  Service: Endoscopy;  Laterality: N/A;   KNEE SURGERY  1998   left inguinal hernia     LUMBAR LAMINECTOMY     TRIGGER FINGER RELEASE  08/04/2012   Procedure: MINOR RELEASE TRIGGER FINGER/A-1 PULLEY;  Surgeon: Richard Sours, MD;  Location: [facility];  Service: Orthopedics;  Laterality: Right;      Current Outpatient Medications  Medication Sig Dispense Refill   polyethylene glycol (MIRALAX / GLYCOLAX) packet Take 17 g by mouth daily as needed for mild constipation.     rosuvastatin (CRESTOR) 20 MG tablet Take 1 tablet (20 mg total) by mouth daily. 90 tablet 3   XARELTO 15 MG TABS tablet Take 15 mg by mouth every evening. with food     furosemide (LASIX) 40 MG tablet Take 1 tablet (40 mg total) by mouth daily. 90 tablet 3   No current facility-administered medications for this visit.    Allergies:   Penicillins    Social History:  The patient  reports that he has never smoked. He has never used smokeless tobacco. He reports that he does not drink alcohol and does not use drugs.   Family History:  The patient's family history includes Breast cancer in his mother; Diabetes in his sister; Gout in his father; Heart disease in his brother; Hypertension in his father.    ROS:  Please see the history of present illness.   Otherwise, review of systems are positive for none.   All other systems are reviewed and negative.    PHYSICAL EXAM: VS:  BP 124/72 (BP Location: Left Arm, Patient Position: Sitting, Cuff Size: Large)    Pulse 71    Ht 5' 10.5" (1.791 m)    Wt (!) 384 lb 6.4 oz (174.4 kg)    SpO2 99%    BMI 54.38 kg/m  , BMI Body mass index is 54.38 kg/m. GENERAL:  Well appearing HEENT: Pupils equal round and reactive, fundi not visualized, oral mucosa unremarkable NECK:  No jugular venous distention, waveform within normal limits, carotid upstroke brisk and symmetric, no bruits LUNGS:  Clear to auscultation bilaterally HEART:  RRR.  PMI not displaced or sustained,S1 and S2 within normal limits, no S3, no S4, no clicks, no rubs, no murmurs ABD:  Flat, positive bowel sounds normal in frequency in pitch, no bruits, no rebound, no guarding, no midline pulsatile mass, no hepatomegaly, no splenomegaly EXT:  2 plus pulses throughout, 2+ L LE edema to above the ankle bilaterally.  No  cyanosis no clubbing SKIN: Hyperpigmentation of the left foot and left anterior tibia NEURO:  Cranial nerves II through XII grossly intact, motor grossly intact throughout PSYCH:  Cognitively intact, oriented to person place and time   EKG:  EKG is ordered today. The ekg ordered 06/04/17 demonstrates sinus rhythm.  Rate 68 bpm.  PACs.  LAFB.   04/20/18: Sinus rhythm.  Rate 78 bpm.  PVC.  Low voltage precordial leads.  LAFB 07/01/19: Sinus rhythm.  Rate 68 bpm.  PACs.   07/12/20: Sinus rhythm.  Rate  74 bpm.  PACs.  LAD.  Poor R wave  progression. 07/12/2021: Sinus rhythm.  Rate 92 bpm.  LAFB.  Echo 07/31/21: 1. Left ventricular ejection fraction, by estimation, is 45 to 50%. The  left ventricle has mildly decreased function. Left ventricular endocardial  border not optimally defined to evaluate regional wall motion. The left  ventricular internal cavity size  was mildly dilated. Left ventricular diastolic parameters are consistent  with Grade I diastolic dysfunction (impaired relaxation).   2. Right ventricular systolic function is normal. The right ventricular  size is normal. There is normal pulmonary artery systolic pressure.   3. Left atrial size was moderately dilated.   4. The mitral valve was not well visualized. No evidence of mitral valve  regurgitation. No evidence of mitral stenosis.   5. The aortic valve was not well visualized. Aortic valve regurgitation  is not visualized. No aortic stenosis is present.   6. Aortic dilatation noted. There is mild dilatation of the ascending  aorta, measuring 43 mm.   Coronary CT-A 08/01/21: IMPRESSION: 1. Coronary artery calcium score 1476 Agatston units. This places the patient in the 86th percentile for age and gender, suggesting high risk for future cardiac events.   2. Technically difficult study significantly affected by motion artifact. Quality of study too low for FFR analysis. The LCx and RCA do not appear to have signficant  stenoses. The left main and proximal LAD may have moderate stenoses. Would suggest cardiolite to assess for ischemia.  Recent Labs: 07/30/2021: BUN 19; Creatinine, Ser 1.06; Potassium 4.9; Sodium 142   02/09/17: WBC 5.1, hemoglobin 15, hematocrit 45.1, platelets 191 Sodium 139, potassium 4.5, BUN 18, creatinine 1.0 AST 23, ALT 15 Total cholesterol 165, triglycerides 47, HDL 37, LDL 119 next TSH 1.77  Lipid Panel    Component Value Date/Time   CHOL 170 07/01/2019 1220   TRIG 54 07/01/2019 1220   HDL 42 07/01/2019 1220   CHOLHDL 4.0 07/01/2019 1220   LDLCALC 117 (H) 07/01/2019 1220      Wt Readings from Last 3 Encounters:  09/06/21 (!) 384 lb 6.4 oz (174.4 kg)  07/12/21 (!) 382 lb 3.2 oz (173.4 kg)  07/12/20 (!) 412 lb (186.9 kg)      ASSESSMENT AND PLAN:  Right heart failure (HCC) Primary heart failure in the setting of pulmonary embolism.  Right heart function was much better on his most recent echo and his pulmonary pressures were normal.  We will get a right heart cath when he gets his left heart cath.  He does have some lower extremity edema and it is unclear how much of it is due to heart failure versus venous insufficiency.  He also has not been very compliant with his Lasix because it makes him up to go the bathroom so much when he goes out.  OSA (obstructive sleep apnea) Continue CPAP.  Acute on chronic combined systolic and diastolic CHF (congestive heart failure) (HCC) LVEF is 40 to 45%.  He also has exertional dyspnea and lower extremity edema.  He is going to start back taking his Lasix every day.  We will also get a left and right heart cath to evaluate for obstructive coronary disease.  He did have left main and LAD disease on his CT that looked at least moderate.  However it was not a sufficient study to send for FFR.  He is agreeable with cath.  We will have him hold his Xarelto 2 days prior to the procedure.  We will add rosuvastatin 20 mg daily and repeat lipids  and  a CMP in 2 to 3 months.  Risks and benefits of cardiac catheterization have been discussed with the patient.  The patient understands that risks included but are not limited to stroke (1 in 1000), death (1 in 38), kidney failure [usually temporary] (1 in 500), bleeding (1 in 200), allergic reaction [possibly serious] (1 in 200). The patient understands and agrees to proceed.    CAD in native artery Coronary CT-A was concerning for LM and LAD stenoses.  Referring for cath as above.     Current medicines are reviewed at length with the patient today.  The patient does not have concerns regarding medicines.  The following changes have been made: none  Labs/ tests ordered today include:   Orders Placed This Encounter  Procedures   CBC with Differential/Platelet   Basic metabolic panel     Disposition:   FU with Tariyah Pendry C. Oval Linsey, MD, [facility] in 2 months    Signed, Elienai Gailey C. Oval Linsey, MD, [facility]  09/06/2021 12:19 PM    [place]

## 2021-09-06 NOTE — Assessment & Plan Note (Addendum)
LVEF is 40 to 45%.  He also has exertional dyspnea and lower extremity edema.  He is going to start back taking his Lasix every day.  We will also get a left and right heart cath to evaluate for obstructive coronary disease.  He did have left main and LAD disease on his CT that looked at least moderate.  However it was not a sufficient study to send for FFR.  He is agreeable with cath.  We will have him hold his Xarelto 2 days prior to the procedure.  We will add rosuvastatin 20 mg daily and repeat lipids and a CMP in 2 to 3 months.  Risks and benefits of cardiac catheterization have been discussed with the patient.  The patient understands that risks included but are not limited to stroke (1 in 1000), death (1 in 1000), kidney failure [usually temporary] (1 in 500), bleeding (1 in 200), allergic reaction [possibly serious] (1 in 200). The patient understands and agrees to proceed.

## 2021-09-06 NOTE — Telephone Encounter (Signed)
Patient seen in office today. 

## 2021-09-06 NOTE — Patient Instructions (Addendum)
We need to do a left heart catheterization to look at your coronary arteries.  Your echo showed that your heart pumping function is weaker than before.  It is pumping 40-45% and in the past it was 60-65%.  This may be contributing to your swelling, so it is important to start back taking lasix every day.  Your CT scan showed that you have some plaque in your coronary arteries, so this procedure will show [place] exactly how severe the plaque is and whether you need medication, stents or possibly surgery to improve the blood flow to your heart.   START ROSUVASTATIN 20  MG DAILY   TAKE THE LASIX (FUROSEMIDE) DAILY   YOU NEED TO UPSTAIRS TODAY FOR LABS  FOLLOW UP 1 MONTH WITH NP   [facility] MED[facility] CARDIOLOGY [address] [place] [address] Dept: [phone]  Jashua Knaak  09/06/2021  You are scheduled for a Cardiac Catheterization on Wednesday, February 15 with Dr. Alverda Skeans.  1. Please arrive at the [facility] (Main Entrance A) at [facility]: [address] [place], [place] [address] at 6:30 AM (This time is two hours before your procedure to ensure your preparation). Free valet parking service is available.   Special note: Every effort is made to have your procedure done on time. Please understand that emergencies sometimes delay scheduled procedures.  2. Diet: Do not eat solid foods after midnight.  The patient may have clear liquids until 5am upon the day of the procedure.  3. Labs: You will need to have blood drawn TODAY   4. Medication instructions in preparation for your procedure:   Contrast Allergy: No  DO NOT TAKE YOUR XARELTO 2 DAYS PRIOR TO CATH   DO NOT TAKE LASIX (FUROSEMIDE) MORNING OF CATH   On the morning of your procedure, take any morning medicines NOT listed above.  You may use sips of water.  5. Plan for one night stay--bring personal belongings. 6. Bring a current list of your  medications and current insurance cards. 7. You MUST have a responsible person to drive you home. 8. Someone MUST be with you the first 24 hours after you arrive home or your discharge will be delayed. 9. Please wear clothes that are easy to get on and off and wear slip-on shoes.  Thank you for allowing [place] to care for you!   -- [facility]

## 2021-09-10 ENCOUNTER — Telehealth: Payer: Self-pay | Admitting: *Deleted

## 2021-09-10 NOTE — Telephone Encounter (Signed)
Follow Up:    Patient is returning your call from today. 

## 2021-09-10 NOTE — Telephone Encounter (Signed)
Cardiac catheterization scheduled at [facility] for:  Wednesday September 11, 2021 8:30 AM [facility] Main Entrance A [facility]) at: 6:30 AM   Diet-no solid food after midnight prior to cath, clear liquids until 5 AM day of procedure.  Medication instructions for procedure: - Hold:   Xarelto-none 09/09/21 until post procedure   Lasix-AM of procedure -Except hold medications usual morning medications can be taken pre-cath with sips of water including aspirin 81 mg.    Must have responsible adult to drive home post procedure and be with patient first 24 hours after arriving home.  [facility] does allow one visitor to wait in the waiting room during the time you are there.  Reviewed procedure instructions with patient.

## 2021-09-11 ENCOUNTER — Encounter (HOSPITAL_COMMUNITY): Payer: Self-pay | Admitting: Internal Medicine

## 2021-09-11 ENCOUNTER — Ambulatory Visit (HOSPITAL_COMMUNITY)
Admission: RE | Admit: 2021-09-11 | Discharge: 2021-09-11 | Disposition: A | Discharge status: Home / Self Care | Payer: No Typology Code available for payment source | Source: Ambulatory Visit | Attending: Internal Medicine | Admitting: Internal Medicine

## 2021-09-11 ENCOUNTER — Other Ambulatory Visit: Payer: Self-pay

## 2021-09-11 ENCOUNTER — Encounter (HOSPITAL_COMMUNITY)
Admission: RE | Disposition: A | Discharge status: Home / Self Care | Payer: Self-pay | Source: Ambulatory Visit | Attending: Internal Medicine

## 2021-09-11 DIAGNOSIS — I2510 Atherosclerotic heart disease of native coronary artery without angina pectoris: Secondary | ICD-10-CM | POA: Diagnosis not present

## 2021-09-11 DIAGNOSIS — I509 Heart failure, unspecified: Secondary | ICD-10-CM

## 2021-09-11 DIAGNOSIS — I429 Cardiomyopathy, unspecified: Secondary | ICD-10-CM | POA: Insufficient documentation

## 2021-09-11 DIAGNOSIS — Z7901 Long term (current) use of anticoagulants: Secondary | ICD-10-CM | POA: Insufficient documentation

## 2021-09-11 DIAGNOSIS — I5043 Acute on chronic combined systolic (congestive) and diastolic (congestive) heart failure: Secondary | ICD-10-CM | POA: Diagnosis not present

## 2021-09-11 DIAGNOSIS — I5082 Biventricular heart failure: Secondary | ICD-10-CM | POA: Insufficient documentation

## 2021-09-11 DIAGNOSIS — Z6841 Body Mass Index (BMI) 40.0 and over, adult: Secondary | ICD-10-CM | POA: Diagnosis not present

## 2021-09-11 DIAGNOSIS — I110 Hypertensive heart disease with heart failure: Secondary | ICD-10-CM | POA: Insufficient documentation

## 2021-09-11 DIAGNOSIS — G4733 Obstructive sleep apnea (adult) (pediatric): Secondary | ICD-10-CM | POA: Diagnosis not present

## 2021-09-11 DIAGNOSIS — Z86711 Personal history of pulmonary embolism: Secondary | ICD-10-CM | POA: Insufficient documentation

## 2021-09-11 HISTORY — PX: CORONARY PRESSURE/FFR STUDY: CATH118243

## 2021-09-11 HISTORY — PX: RIGHT/LEFT HEART CATH AND CORONARY ANGIOGRAPHY: CATH118266

## 2021-09-11 HISTORY — PX: INTRAVASCULAR PRESSURE WIRE/FFR STUDY: CATH118243

## 2021-09-11 LAB — POCT I-STAT EG7
Acid-Base Excess: 1 mmol/L (ref 0.0–2.0)
Acid-Base Excess: 1 mmol/L (ref 0.0–2.0)
Acid-Base Excess: 1 mmol/L (ref 0.0–2.0)
Acid-Base Excess: 1 mmol/L (ref 0.0–2.0)
Bicarbonate: 26.3 mmol/L (ref 20.0–28.0)
Bicarbonate: 26.7 mmol/L (ref 20.0–28.0)
Bicarbonate: 27 mmol/L (ref 20.0–28.0)
Bicarbonate: 27.3 mmol/L (ref 20.0–28.0)
Calcium, Ion: 1.15 mmol/L (ref 1.15–1.40)
Calcium, Ion: 1.15 mmol/L (ref 1.15–1.40)
Calcium, Ion: 1.18 mmol/L (ref 1.15–1.40)
Calcium, Ion: 1.19 mmol/L (ref 1.15–1.40)
HCT: 42 % (ref 39.0–52.0)
HCT: 43 % (ref 39.0–52.0)
HCT: 43 % (ref 39.0–52.0)
HCT: 43 % (ref 39.0–52.0)
Hemoglobin: 14.3 g/dL (ref 13.0–17.0)
Hemoglobin: 14.6 g/dL (ref 13.0–17.0)
Hemoglobin: 14.6 g/dL (ref 13.0–17.0)
Hemoglobin: 14.6 g/dL (ref 13.0–17.0)
O2 Saturation: 74 %
O2 Saturation: 75 %
O2 Saturation: 75 %
O2 Saturation: 75 %
Potassium: 3.7 mmol/L (ref 3.5–5.1)
Potassium: 3.7 mmol/L (ref 3.5–5.1)
Potassium: 3.9 mmol/L (ref 3.5–5.1)
Potassium: 3.9 mmol/L (ref 3.5–5.1)
Sodium: 140 mmol/L (ref 135–145)
Sodium: 140 mmol/L (ref 135–145)
Sodium: 140 mmol/L (ref 135–145)
Sodium: 140 mmol/L (ref 135–145)
TCO2: 28 mmol/L (ref 22–32)
TCO2: 28 mmol/L (ref 22–32)
TCO2: 28 mmol/L (ref 22–32)
TCO2: 29 mmol/L (ref 22–32)
pCO2, Ven: 45.4 mmHg (ref 44–60)
pCO2, Ven: 45.7 mmHg (ref 44–60)
pCO2, Ven: 46.6 mmHg (ref 44–60)
pCO2, Ven: 46.9 mmHg (ref 44–60)
pH, Ven: 7.366 (ref 7.25–7.43)
pH, Ven: 7.371 (ref 7.25–7.43)
pH, Ven: 7.373 (ref 7.25–7.43)
pH, Ven: 7.379 (ref 7.25–7.43)
pO2, Ven: 41 mmHg (ref 32–45)
pO2, Ven: 41 mmHg (ref 32–45)
pO2, Ven: 41 mmHg (ref 32–45)
pO2, Ven: 42 mmHg (ref 32–45)

## 2021-09-11 LAB — POCT I-STAT 7, (LYTES, BLD GAS, ICA,H+H)
Acid-Base Excess: 0 mmol/L (ref 0.0–2.0)
Bicarbonate: 25.1 mmol/L (ref 20.0–28.0)
Calcium, Ion: 1.11 mmol/L — ABNORMAL LOW (ref 1.15–1.40)
HCT: 42 % (ref 39.0–52.0)
Hemoglobin: 14.3 g/dL (ref 13.0–17.0)
O2 Saturation: 97 %
Potassium: 3.8 mmol/L (ref 3.5–5.1)
Sodium: 141 mmol/L (ref 135–145)
TCO2: 26 mmol/L (ref 22–32)
pCO2 arterial: 40.9 mmHg (ref 32–48)
pH, Arterial: 7.396 (ref 7.35–7.45)
pO2, Arterial: 87 mmHg (ref 83–108)

## 2021-09-11 SURGERY — RIGHT/LEFT HEART CATH AND CORONARY ANGIOGRAPHY
Anesthesia: LOCAL

## 2021-09-11 MED ORDER — HEPARIN SODIUM (PORCINE) 1000 UNIT/ML IJ SOLN
INTRAMUSCULAR | Status: AC
  Filled 2021-09-11: qty 10

## 2021-09-11 MED ORDER — ONDANSETRON HCL 4 MG/2ML IJ SOLN: 4.0000 mg | Freq: Four times a day (QID) | INTRAMUSCULAR | Status: DC | PRN

## 2021-09-11 MED ORDER — SODIUM CHLORIDE 0.9% FLUSH: 3.0000 mL | INTRAVENOUS | Status: DC | PRN

## 2021-09-11 MED ORDER — SODIUM CHLORIDE 0.9 % WEIGHT BASED INFUSION
1.0000 mL/kg/h | INTRAVENOUS | Status: DC
  Administered 2021-09-11: 1 mL/kg/h via INTRAVENOUS

## 2021-09-11 MED ORDER — MIDAZOLAM HCL 2 MG/2ML IJ SOLN
INTRAMUSCULAR | Status: AC
  Filled 2021-09-11: qty 2

## 2021-09-11 MED ORDER — SODIUM CHLORIDE 0.9 % IV SOLN: 250.0000 mL | INTRAVENOUS | Status: DC | PRN

## 2021-09-11 MED ORDER — HEPARIN (PORCINE) IN NACL 1000-0.9 UT/500ML-% IV SOLN
INTRAVENOUS | Status: DC | PRN
  Administered 2021-09-11 (×2): 500 mL

## 2021-09-11 MED ORDER — HEPARIN (PORCINE) IN NACL 1000-0.9 UT/500ML-% IV SOLN
INTRAVENOUS | Status: AC
  Filled 2021-09-11: qty 1000

## 2021-09-11 MED ORDER — HEPARIN SODIUM (PORCINE) 1000 UNIT/ML IJ SOLN
INTRAMUSCULAR | Status: DC | PRN
  Administered 2021-09-11: 10000 [IU] via INTRAVENOUS
  Administered 2021-09-11: 5000 [IU] via INTRAVENOUS
  Administered 2021-09-11: 2000 [IU] via INTRAVENOUS

## 2021-09-11 MED ORDER — SODIUM CHLORIDE 0.9 % WEIGHT BASED INFUSION: 3.0000 mL/kg/h | INTRAVENOUS | Status: AC

## 2021-09-11 MED ORDER — SODIUM CHLORIDE 0.9% FLUSH: 3.0000 mL | Freq: Two times a day (BID) | INTRAVENOUS | Status: DC

## 2021-09-11 MED ORDER — LABETALOL HCL 5 MG/ML IV SOLN: 10.0000 mg | INTRAVENOUS | Status: DC | PRN

## 2021-09-11 MED ORDER — VERAPAMIL HCL 2.5 MG/ML IV SOLN
INTRAVENOUS | Status: AC
  Filled 2021-09-11: qty 2

## 2021-09-11 MED ORDER — VERAPAMIL HCL 2.5 MG/ML IV SOLN
INTRAVENOUS | Status: DC | PRN
  Administered 2021-09-11: 10 mL via INTRA_ARTERIAL

## 2021-09-11 MED ORDER — ASPIRIN 81 MG PO CHEW: 81.0000 mg | CHEWABLE_TABLET | ORAL | Status: DC

## 2021-09-11 MED ORDER — IOHEXOL 350 MG/ML SOLN
INTRAVENOUS | Status: DC | PRN
  Administered 2021-09-11: 120 mg

## 2021-09-11 MED ORDER — HYDRALAZINE HCL 20 MG/ML IJ SOLN: 10.0000 mg | INTRAMUSCULAR | Status: DC | PRN

## 2021-09-11 MED ORDER — MIDAZOLAM HCL 2 MG/2ML IJ SOLN
INTRAMUSCULAR | Status: DC | PRN
  Administered 2021-09-11 (×4): 1 mg via INTRAVENOUS

## 2021-09-11 MED ORDER — LIDOCAINE HCL (PF) 1 % IJ SOLN
INTRAMUSCULAR | Status: AC
  Filled 2021-09-11: qty 30

## 2021-09-11 MED ORDER — FENTANYL CITRATE (PF) 100 MCG/2ML IJ SOLN
INTRAMUSCULAR | Status: DC | PRN
  Administered 2021-09-11 (×4): 25 ug via INTRAVENOUS

## 2021-09-11 MED ORDER — LIDOCAINE HCL (PF) 1 % IJ SOLN
INTRAMUSCULAR | Status: DC | PRN
  Administered 2021-09-11: 5 mL

## 2021-09-11 MED ORDER — FENTANYL CITRATE (PF) 100 MCG/2ML IJ SOLN
INTRAMUSCULAR | Status: AC
  Filled 2021-09-11: qty 2

## 2021-09-11 MED ORDER — ACETAMINOPHEN 325 MG PO TABS
650.0000 mg | ORAL_TABLET | ORAL | Status: DC | PRN
  Administered 2021-09-11: 650 mg via ORAL
  Filled 2021-09-11: qty 2

## 2021-09-11 SURGICAL SUPPLY — 21 items
CATH BALLN WEDGE 5F 110CM (CATHETERS) ×1 IMPLANT
CATH INFINITI 6F ANG MULTIPACK (CATHETERS) ×1 IMPLANT
CATH INFINITI 6F FL3.5 (CATHETERS) ×1 IMPLANT
CATH INFINITI JR4 5F (CATHETERS) ×1 IMPLANT
CATH LAUNCHER 6FR EBU3.5 (CATHETERS) ×2 IMPLANT
DEVICE RAD COMP TR BAND LRG (VASCULAR PRODUCTS) ×1 IMPLANT
GLIDESHEATH SLEND SS 6F .021 (SHEATH) ×1 IMPLANT
GUIDEWIRE .025 260CM (WIRE) ×1 IMPLANT
GUIDEWIRE INQWIRE 1.5J.035X260 (WIRE) IMPLANT
GUIDEWIRE PRESSURE X 175 (WIRE) ×1 IMPLANT
GUIDEWIRE TIGER .035X300 (WIRE) ×1 IMPLANT
INQWIRE 1.5J .035X260CM (WIRE) ×4
KIT ESSENTIALS PG (KITS) ×1 IMPLANT
KIT HEART LEFT (KITS) ×2 IMPLANT
PACK CARDIAC CATHETERIZATION (CUSTOM PROCEDURE TRAY) ×2 IMPLANT
SHEATH GLIDE SLENDER 4/5FR (SHEATH) ×1 IMPLANT
SHEATH PROBE COVER 6X72 (BAG) ×1 IMPLANT
TRANSDUCER W/STOPCOCK (MISCELLANEOUS) ×2 IMPLANT
TUBING ART PRESS 72  MALE/MALE (TUBING) ×1 IMPLANT
TUBING CIL FLEX 10 FLL-RA (TUBING) ×2 IMPLANT
WIRE MICROINTRODUCER 60CM (WIRE) ×1 IMPLANT

## 2021-09-11 NOTE — Discharge Instructions (Addendum)
Resume Xarelto on 2/16

## 2021-09-11 NOTE — Interval H&P Note (Signed)
History and Physical Interval Note:  09/11/2021 7:39 AM  Richard Sandoval  has presented today for surgery, with the diagnosis of now onset systolic heart failure.  The various methods of treatment have been discussed with the patient and family. After consideration of risks, benefits and other options for treatment, the patient has consented to  Procedure(s): RIGHT/LEFT HEART CATH AND CORONARY ANGIOGRAPHY (N/A) as a surgical intervention.  The patient's history has been reviewed, patient examined, no change in status, stable for surgery.  I have reviewed the patient's chart and labs.  Questions were answered to the patient's satisfaction.    Cath Lab Visit (complete for each Cath Lab visit)  Clinical Evaluation Leading to the Procedure:   ACS: No.  Non-ACS:    Anginal Classification: CCS II  Anti-ischemic medical therapy: Minimal Therapy (1 class of medications)  Non-Invasive Test Results: No non-invasive testing performed  Prior CABG: No previous CABG        Orbie Pyo

## 2021-09-23 ENCOUNTER — Ambulatory Visit (HOSPITAL_BASED_OUTPATIENT_CLINIC_OR_DEPARTMENT_OTHER): Payer: Medicare Other | Admitting: Cardiovascular Disease

## 2021-10-11 ENCOUNTER — Other Ambulatory Visit: Payer: Self-pay

## 2021-10-11 ENCOUNTER — Ambulatory Visit (INDEPENDENT_AMBULATORY_CARE_PROVIDER_SITE_OTHER): Payer: No Typology Code available for payment source | Admitting: Family

## 2021-10-11 ENCOUNTER — Encounter (HOSPITAL_BASED_OUTPATIENT_CLINIC_OR_DEPARTMENT_OTHER): Payer: Self-pay | Admitting: Family

## 2021-10-11 VITALS — BP 118/68 | HR 75 | Ht 70.5 in | Wt 380.8 lb

## 2021-10-11 DIAGNOSIS — I5042 Chronic combined systolic (congestive) and diastolic (congestive) heart failure: Secondary | ICD-10-CM

## 2021-10-11 DIAGNOSIS — Z86711 Personal history of pulmonary embolism: Secondary | ICD-10-CM

## 2021-10-11 DIAGNOSIS — E785 Hyperlipidemia, unspecified: Secondary | ICD-10-CM | POA: Diagnosis not present

## 2021-10-11 DIAGNOSIS — Z86718 Personal history of other venous thrombosis and embolism: Secondary | ICD-10-CM | POA: Diagnosis not present

## 2021-10-11 DIAGNOSIS — G4733 Obstructive sleep apnea (adult) (pediatric): Secondary | ICD-10-CM

## 2021-10-11 DIAGNOSIS — I25118 Atherosclerotic heart disease of native coronary artery with other forms of angina pectoris: Secondary | ICD-10-CM | POA: Diagnosis not present

## 2021-10-11 DIAGNOSIS — D6859 Other primary thrombophilia: Secondary | ICD-10-CM

## 2021-10-11 MED ORDER — METOPROLOL SUCCINATE ER 25 MG PO TB24: 25.0000 mg | ORAL_TABLET | Freq: Every day | ORAL | 1 refills | Status: DC

## 2021-10-11 MED ORDER — FUROSEMIDE 20 MG PO TABS: 20.0000 mg | ORAL_TABLET | Freq: Every day | ORAL | 1 refills | Status: AC

## 2021-10-11 NOTE — Progress Notes (Signed)
? ?Office Visit  ?  ?Patient Name: Richard Sandoval ?Date of Encounter: 10/11/2021 ? ?PCP:  Laurann Montana, MD ?  ?[facility]  ?Cardiologist:  Chilton Si, MD  ?Advanced Practice Provider:  No care team member to display ?Electrophysiologist:  None  ?   ? ?Chief Complaint  ?  ?Richard Sandoval is a 76 y.o. male with a hx of coronary disease, pulmonary embolism, hypertension, morbid obesity, OSA on CPAP, combined systolic and diastolic heart failure  presents today for follow-up after cardiac catheterization ? ?Past Medical History  ?  ?Past Medical History:  ?Diagnosis Date  ? Acute on chronic combined systolic and diastolic CHF (congestive heart failure) (HCC) 09/06/2021  ? CAD in native artery 09/06/2021  ? DVT (deep venous thrombosis) (HCC)   ? L leg in 2009  ? Dyspnea   ? GERD (gastroesophageal reflux disease)   ? Hypertension   ? enlarged heart  ? Morbid obesity (HCC)   ? OSA (obstructive sleep apnea)   ? on CPAP since 1995  ? Pancreatitis   ? Pulmonary hypertension (HCC) 06/04/2017  ? Right heart failure (HCC) 08/27/2017  ? ?Past Surgical History:  ?Procedure Laterality Date  ? BACK SURGERY  04/09/1999  ? cervical laminectomy and fusion    ? CHOLECYSTECTOMY N/A 01/25/2014  ? Procedure: LAPAROSCOPIC CHOLECYSTECTOMY WITH INTRAOPERATIVE CHOLANGIOGRAM;  Surgeon: Velora Heckler, MD;  Location: [facility]S;  Service: General;  Laterality: N/A;  ? COLONOSCOPY    ? COLONOSCOPY WITH PROPOFOL N/A 03/04/2019  ? Procedure: COLONOSCOPY WITH PROPOFOL;  Surgeon: Charlott Rakes, MD;  Location: [facility] ENDOSCOPY;  Service: Endoscopy;  Laterality: N/A;  ? EUS N/A 01/24/2014  ? Procedure: UPPER ENDOSCOPIC ULTRASOUND (EUS) RADIAL;  Surgeon: Willis Modena, MD;  Location: [facility] ENDOS[facility];  Service: Endoscopy;  Laterality: N/A;  ? INTRAVASCULAR PRESSURE WIRE/FFR STUDY N/A 09/11/2021  ? Procedure: INTRAVASCULAR PRESSURE WIRE/FFR STUDY;  Surgeon: Orbie Pyo, MD;  Location: [facility];  Service: Cardiovascular;   Laterality: N/A;  ? KNEE SURGERY  1998  ? left inguinal hernia    ? LUMBAR LAMINECTOMY    ? RIGHT/LEFT HEART CATH AND CORONARY ANGIOGRAPHY N/A 09/11/2021  ? Procedure: RIGHT/LEFT HEART CATH AND CORONARY ANGIOGRAPHY;  Surgeon: Orbie Pyo, MD;  Location: [facility]VASIVE CV LAB;  Service: Cardiovascular;  Laterality: N/A;  ? TRIGGER FINGER RELEASE  08/04/2012  ? Procedure: MINOR RELEASE TRIGGER FINGER/A-1 PULLEY;  Surgeon: Nicki Reaper, MD;  Location: [facility];  Service: Orthopedics;  Laterality: Right;  ? ? ?Allergies ? ?Allergies  ?Allergen Reactions  ? Penicillins Itching and Other (See Comments)  ?  Has patient had a PCN reaction causing immediate rash, facial/tongue/throat swelling, SOB or lightheadedness with hypotension: No ?Has patient had a PCN reaction causing severe rash involving mucus membranes or skin necrosis: No ?Has patient had a PCN reaction that required hospitalization No ?Has patient had a PCN reaction occurring within the last 10 years: No ?If all of the above answers are "NO", then may proceed with Cephalosporin use.  ? ? ?History of Present Illness  ?  ?Richard Sandoval is a 76 y.o. male with a hx of coronary disease, pulmonary embolism, hypertension, morbid obesity, OSA on CPAP, combined systolic and diastolic heart failure  last seen for cardiac catheterization 09/11/2021. ? ?He was initially seen in November 2018 for exertional dyspnea which was ongoing since back surgery in 2000.  July 2017 lower extremity DVT and PE.  Echo July 2017 LVEF 50 to  [id number], grade 1 diastolic dysfunction, PASP 52 mmHg.  Echo November 2018 LVEF 55 to [id number], normal diastolic function.  He was seen in clinic noting successful weight loss of 60 pounds with exertional dyspnea and was recommended for echo performed January 2023 revealing LVEF 45 to [id number], grade 1 diastolic dysfunction, ascending aorta mildly dilated at 4.3 cm.  Coronary CTA was technically difficult the concern for moderate mid LAD stenosis and left  main stenosis.  He was recommended for cardiac catheterization performed 09/11/2021 revealing mild coronary disease (first March 30%, proximal LAD 20%, distal LAD 30%, mid LM 30%) recommended for medical therapy. His cardiac output was 8 L/min, wedge pressure 12 mmHg, LVEDP unable to be assessed due to radial and brachial vasospasm.  ? ?He presents today for follow-up. Reports no shortness of breath at rest and stable dyspnea on exertion. Reports no chest pain, pressure, or tightness. No edema, orthopnea, PND. Reports no palpitations.  Drinking less than 2L fluid per day. Following a mostly low sodium diet and eats most of his meals at home. No formal exercise routine but does enjoy working in his yard and spending time with his grandsons who are 67, 19, and 69 years old.  ? ?EKGs/Labs/Other Studies Reviewed:  ? ?The following studies were reviewed today: ? ?Right/left heart cath 09/11/2021 ?1st Mrg lesion is 30% stenosed. ?  Prox LAD lesion is 20% stenosed. ?  Dist LAD lesion is 30% stenosed. ?  Mid LM lesion is 30% stenosed. ?  ?1.  Mild obstructive coronary artery disease with RFR negative left main disease. ?2.  Cardiac output of 8 L/min and an index of 2.9 L/min with wedge pressure of 12 mmHg and RA pressure of 13 mmHg; LVEDP was unable to be assessed due to radial and brachial vasospasm. ?  ?Recommendations: Medical therapy. ?  ?Echo 07/31/21: ?1. Left ventricular ejection fraction, by estimation, is 45 to 50%. The  ?left ventricle has mildly decreased function. Left ventricular endocardial  ?border not optimally defined to evaluate regional wall motion. The left  ?ventricular internal cavity size  ?was mildly dilated. Left ventricular diastolic parameters are consistent  ?with Grade I diastolic dysfunction (impaired relaxation).  ? 2. Right ventricular systolic function is normal. The right ventricular  ?size is normal. There is normal pulmonary artery systolic pressure.  ? 3. Left atrial size was moderately  dilated.  ? 4. The mitral valve was not well visualized. No evidence of mitral valve  ?regurgitation. No evidence of mitral stenosis.  ? 5. The aortic valve was not well visualized. Aortic valve regurgitation  ?is not visualized. No aortic stenosis is present.  ? 6. Aortic dilatation noted. There is mild dilatation of the ascending  ?aorta, measuring 43 mm.  ?  ?Coronary CT-A 08/01/21: ?IMPRESSION: ?1. Coronary artery calcium score 1476 Agatston units. This places ?the patient in the 86th percentile for age and gender, suggesting ?high risk for future cardiac events. ?  ?2. Technically difficult study significantly affected by motion ?artifact. Quality of study too low for FFR analysis. The LCx and RCA ?do not appear to have signficant stenoses. The left main and ?proximal LAD may have moderate stenoses. Would suggest cardiolite to ?assess for ischemia. ? ?EKG: No EKG today ? ?Recent Labs: ?09/06/2021: BUN 14; Creatinine, Ser 0.97; Platelets 184 ?09/11/2021: Hemoglobin 14.6; Potassium 3.7; Sodium 140  ?Recent Lipid Panel ?   ?Component Value Date/Time  ? CHOL 170 07/01/2019 1220  ? TRIG 54 07/01/2019 1220  ? HDL 42 07/01/2019 1220  ?  CHOLHDL 4.0 07/01/2019 1220  ? LDLCALC 117 (H) 07/01/2019 1220  ? ?Home Medications  ? ?Current Meds  ?Medication Sig  ? docusate sodium (COLACE) 100 MG capsule Take 100 mg by mouth 2 (two) times a week.  ? furosemide (LASIX) 20 MG tablet Take 1 tablet (20 mg total) by mouth daily.  ? metoprolol succinate (TOPROL XL) 25 MG 24 hr tablet Take 1 tablet (25 mg total) by mouth daily.  ? polyethylene glycol (MIRALAX / GLYCOLAX) packet Take 17 g by mouth daily as needed for mild constipation.  ? rosuvastatin (CRESTOR) 20 MG tablet Take 1 tablet (20 mg total) by mouth daily.  ? XARELTO 15 MG TABS tablet Take 15 mg by mouth daily with supper. with food  ? [DISCONTINUED] furosemide (LASIX) 40 MG tablet Take 1 tablet (40 mg total) by mouth daily.  ?  ? ?Review of Systems  ?    ?All other systems  reviewed and are otherwise negative except as noted above. ? ?Physical Exam  ?  ?VS:  BP 118/68 (BP Location: Right Arm, Patient Position: Sitting, Cuff Size: Large)   Pulse 75   Ht 5' 10.5" (1.791 m)   Wt (!) 380 lb 12

## 2021-10-11 NOTE — Patient Instructions (Addendum)
Medication Instructions:  ?Your physician has recommended you make the following change in your medication:  ? ?CHANGE Furosemide (Lasix) to 20mg  daily ? ?START Metoprolol Succinate (Toprol) one 25mg  tablet daily ? ?These medication changes are to help strengthen your heart muscle. ? ?*If you need a refill on your cardiac medications before your next appointment, please call your pharmacy* ? ? ?Lab Work: ?Your physician recommends that you return for lab work in 2 weeks for fasting lipid panel, CMET ? ?If you have labs (blood work) drawn today and your tests are completely normal, you will receive your results only by: ?MyChart Message (if you have MyChart) OR ?A paper copy in the mail ?If you have any lab test that is abnormal or we need to change your treatment, we will call you to review the results. ? ? ?Testing/Procedures: ?Your EKG today showed normal sinus rhythm. ? ?Your echocardiogram showed your heart muscle is a little weak. We have made changes today to help strengthen the heart muscle. ? ?Your cardiac catheterization showed only mild plaque in your heart but no significant blockage. We prevent this from worsening by treating your cholesterol with Rosuvastatin (Crestor). ? ?Follow-Up: ?At [facility], you and your health needs are our priority.  As part of our continuing mission to provide you with exceptional heart care, we have created designated Provider Care Teams.  These Care Teams include your primary Cardiologist (physician) and Advanced Practice Providers (APPs -  Physician Assistants and Nurse Practitioners) who all work together to provide you with the care you need, when you need it. ? ?We recommend signing up for the patient portal called "MyChart".  Sign up information is provided on this After Visit Summary.  MyChart is used to connect with patients for Virtual Visits (Telemedicine).  Patients are able to view lab/test results, encounter notes, upcoming appointments, etc.  Non-urgent  messages can be sent to your provider as well.   ?To learn more about what you can do with MyChart, go to [email or web address].   ? ?Your next appointment:   ?2 month(s) ? ?The format for your next appointment:   ?In Person ? ?Provider:   ?Skeet Latch, MD or Loel Dubonnet, NP   ? ? ?Other Instructions ? ?Heart Healthy Diet Recommendations: ?A low-salt diet is recommended. Meats should be grilled, baked, or boiled. Avoid fried foods. Focus on lean protein sources like fish or chicken with vegetables and fruits. The American Heart Association is a Microbiologist!  American Heart Association Diet and Lifeystyle Recommendations   ?Drink less than 2 liters (64 oz) of fluid per day. ? ?Recommend weighing daily and keeping a log. Please call our office if you have weight gain of 2 pounds overnight or 5 pounds in 1 week. This would be a sign that you are holding onto extra fluid and we want to know.  ? ?Date ? Time Weight  ? ?    ? ?    ? ?    ? ?    ? ?    ? ?    ? ?    ? ?    ?   ? ? ?

## 2021-10-14 DIAGNOSIS — U071 COVID-19: Secondary | ICD-10-CM | POA: Diagnosis not present

## 2021-10-25 ENCOUNTER — Telehealth (HOSPITAL_BASED_OUTPATIENT_CLINIC_OR_DEPARTMENT_OTHER): Payer: Self-pay

## 2021-10-25 LAB — LIPID PANEL
Chol/HDL Ratio: 2.6 ratio (ref 0.0–5.0)
Cholesterol, Total: 113 mg/dL (ref 100–199)
HDL: 44 mg/dL (ref >39)
LDL Chol Calc (NIH): 58 mg/dL (ref 0–99)
Triglycerides: 47 mg/dL (ref 0–149)
VLDL Cholesterol Cal: 11 mg/dL (ref 5–40)

## 2021-10-25 LAB — COMPREHENSIVE METABOLIC PANEL
ALT: 12 IU/L (ref 0–44)
AST: 20 IU/L (ref 0–40)
Albumin/Globulin Ratio: 1.2 (ref 1.2–2.2)
Albumin: 3.6 g/dL — ABNORMAL LOW (ref 3.7–4.7)
Alkaline Phosphatase: 61 IU/L (ref 44–121)
BUN/Creatinine Ratio: 18 (ref 10–24)
BUN: 16 mg/dL (ref 8–27)
Bilirubin Total: 1.1 mg/dL (ref 0.0–1.2)
CO2: 26 mmol/L (ref 20–29)
Calcium: 9.1 mg/dL (ref 8.6–10.2)
Chloride: 104 mmol/L (ref 96–106)
Creatinine, Ser: 0.88 mg/dL (ref 0.76–1.27)
Globulin, Total: 3 g/dL (ref 1.5–4.5)
Glucose: 107 mg/dL — ABNORMAL HIGH (ref 70–99)
Potassium: 4.4 mmol/L (ref 3.5–5.2)
Sodium: 140 mmol/L (ref 134–144)
Total Protein: 6.6 g/dL (ref 6.0–8.5)
eGFR: 90 mL/min/{1.73_m2} (ref >59)

## 2021-10-25 NOTE — Telephone Encounter (Addendum)
Called results to patient and left results on VM (ok per DPR), instructions left to call office back if patient has any questions!  ? ? ? ?----- Message from Loel Dubonnet, NP sent at 10/25/2021 12:30 PM EDT ----- ?Normal kidneys, liver, electrolytes.  Lipid panel shows LDL at goal of less than 70.  Continue current medications. ?

## 2021-11-23 DIAGNOSIS — Z20822 Contact with and (suspected) exposure to covid-19: Secondary | ICD-10-CM | POA: Diagnosis not present

## 2021-12-05 DIAGNOSIS — U071 COVID-19: Secondary | ICD-10-CM | POA: Diagnosis not present

## 2021-12-15 NOTE — Progress Notes (Signed)
Office Visit    Patient Name: Richard Sandoval Date of Encounter: 12/16/2021  PCP:  Laurann MontanaWhite, Cynthia, MD   [facility]  Cardiologist:  Chilton Siiffany [place], MD  Advanced Practice Provider:  No care team member to display Electrophysiologist:  None      Chief Complaint    Richard Sandoval is a 76 y.o. male with a hx of coronary disease, pulmonary embolism, hypertension, morbid obesity, OSA on CPAP, combined systolic and diastolic heart failure  presents today for heart failure follow-up   Past Medical History    Past Medical History:  Diagnosis Date   Acute on chronic combined systolic and diastolic CHF (congestive heart failure) (HCC) 09/06/2021   CAD in native artery 09/06/2021   DVT (deep venous thrombosis) (HCC)    L leg in 2009   Dyspnea    GERD (gastroesophageal reflux disease)    Hypertension    enlarged heart   Morbid obesity (HCC)    OSA (obstructive sleep apnea)    on CPAP since 1995   Pancreatitis    Pulmonary hypertension (HCC) 06/04/2017   Right heart failure (HCC) 08/27/2017   Past Surgical History:  Procedure Laterality Date   BACK SURGERY  04/09/1999   cervical laminectomy and fusion     CHOLECYSTECTOMY N/A 01/25/2014   Procedure: LAPAROSCOPIC CHOLECYSTECTOMY WITH INTRAOPERATIVE CHOLANGIOGRAM;  Surgeon: [name] M Gerkin, MD;  Location: [facility];  Service: General;  Laterality: N/A;   COLONOSCOPY     COLONOSCOPY WITH PROPOFOL N/A 03/04/2019   Procedure: COLONOSCOPY WITH PROPOFOL;  Surgeon: Charlott RakesSchooler, Vincent, MD;  Location: [facility];  Service: Endoscopy;  Laterality: N/A;   EUS N/A 01/24/2014   Procedure: UPPER ENDOSCOPIC ULTRASOUND (EUS) RADIAL;  Surgeon: Willis ModenaWilliam [name], MD;  Location: [facility];  Service: Endoscopy;  Laterality: N/A;   INTRAVASCULAR PRESSURE WIRE/FFR STUDY N/A 09/11/2021   Procedure: INTRAVASCULAR PRESSURE WIRE/FFR STUDY;  Surgeon: Orbie Pyohukkani, Arun K, MD;  Location: [facility];  Service: Cardiovascular;  Laterality: N/A;    KNEE SURGERY  1998   left inguinal hernia     LUMBAR LAMINECTOMY     RIGHT/LEFT HEART CATH AND CORONARY ANGIOGRAPHY N/A 09/11/2021   Procedure: RIGHT/LEFT HEART CATH AND CORONARY ANGIOGRAPHY;  Surgeon: Orbie Pyohukkani, Arun K, MD;  Location: [facility];  Service: Cardiovascular;  Laterality: N/A;   TRIGGER FINGER RELEASE  08/04/2012   Procedure: MINOR RELEASE TRIGGER FINGER/A-1 PULLEY;  Surgeon: Nicki ReaperGary R Kuzma, MD;  Location: [facility];  Service: Orthopedics;  Laterality: Right;    Allergies  Allergies  Allergen Reactions   Ace Inhibitors     Other reaction(s): cough   Penicillins Itching and Other (See Comments)    Has patient had a PCN reaction causing immediate rash, facial/tongue/throat swelling, SOB or lightheadedness with hypotension: No Has patient had a PCN reaction causing severe rash involving mucus membranes or skin necrosis: No Has patient had a PCN reaction that required hospitalization No Has patient had a PCN reaction occurring within the last 10 years: No If all of the above answers are "NO", then may proceed with Cephalosporin use.    History of Present Illness    Richard Sandoval is a 76 y.o. male with a hx of coronary disease, pulmonary embolism, hypertension, morbid obesity, OSA on CPAP, combined systolic and diastolic heart failure  last seen 10/24/21.  He was initially seen in November 2018 for exertional dyspnea which was ongoing since back surgery in 2000.  July 2017 lower extremity DVT and  PE.  Echo July 2017 LVEF 50 to 55%, grade 1 diastolic dysfunction, PASP 52 mmHg.  Echo November 2018 LVEF 55 to 60%, normal diastolic function.  He was seen in clinic noting successful weight loss of 60 pounds with exertional dyspnea and was recommended for echo performed January 2023 revealing LVEF 45 to 50%, grade 1 diastolic dysfunction, ascending aorta mildly dilated at 4.3 cm.  Coronary CTA was technically difficult the concern for moderate mid LAD stenosis and left  main stenosis.  He was recommended for cardiac catheterization performed 09/11/2021 revealing mild coronary disease (first March 30%, proximal LAD 20%, distal LAD 30%, mid LM 30%) recommended for medical therapy. His cardiac output was 8 L/min, wedge pressure 12 mmHg, LVEDP unable to be assessed due to radial and brachial vasospasm.   At last clinic visit he was doing well from a cardiac perspective with weight down 4 pounds. However, only taking Lasix intermittently due to frequency. Education provided and dose reduced to  daily. Metoprolol succinate initiated for dual heart failure and coronary disease benefit.   He presents today for follow up. Notes his wife has had some health difficulties since last seen so he has been focusing on taking care of her.  Weight up 3 pounds compared to clinic visit 2 months ago.  He is taking his Lasix intermittently due to urinary frequency about twice per week.  He notes stable exertional dyspnea which is improved when he ambulates with Flavius Repsher instead of cane.  Notes mild lower extremity edema when he does not take his Lasix for a few days..  No orthopnea, PND, palpitations.  Saw PCP last week for melena with Hb 14.  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  Follows a low-sodium diet and eats most of his meals at home.  He is drinking 3 to 4 sixteen ounce bottles of water per day-following less than 2 L of fluid intake recommendation.   EKGs/Labs/Other Studies Reviewed:   The following studies were reviewed today:  Right/left heart cath 09/11/2021 1st Mrg lesion is 30% stenosed.   Prox LAD lesion is 20% stenosed.   Dist LAD lesion is 30% stenosed.   Mid LM lesion is 30% stenosed.   1.  Mild obstructive coronary artery disease with RFR negative left main disease. 2.  Cardiac output of 8 L/min and an index of 2.9 L/min with wedge pressure of 12 mmHg and RA pressure of 13 mmHg; LVEDP was unable to be assessed due to  radial and brachial vasospasm.   Recommendations: Medical therapy.   Echo 07/31/21: 1. Left ventricular ejection fraction, by estimation, is 45 to 50%. The  left ventricle has mildly decreased function. Left ventricular endocardial  border not optimally defined to evaluate regional wall motion. The left  ventricular internal cavity size  was mildly dilated. Left ventricular diastolic parameters are consistent  with Grade I diastolic dysfunction (impaired relaxation).   2. Right ventricular systolic function is normal. The right ventricular  size is normal. There is normal pulmonary artery systolic pressure.   3. Left atrial size was moderately dilated.   4. The mitral valve was not well visualized. No evidence of mitral valve  regurgitation. No evidence of mitral stenosis.   5. The aortic valve was not well visualized. Aortic valve regurgitation  is not visualized. No aortic stenosis is present.   6. Aortic dilatation noted. There is mild dilatation of the ascending  aorta, measuring 43 mm.  Coronary CT-A 08/01/21: IMPRESSION: 1. Coronary artery calcium score 1476 Agatston units. This places the patient in the 86th percentile for age and gender, suggesting high risk for future cardiac events.   2. Technically difficult study significantly affected by motion artifact. Quality of study too low for FFR analysis. The LCx and RCA do not appear to have signficant stenoses. The left main and proximal LAD may have moderate stenoses. Would suggest cardiolite to assess for ischemia.  EKG: No EKG today  Recent Labs: 09/06/2021: Platelets 184 09/11/2021: Hemoglobin 14.6 10/24/2021: ALT 12; BUN 16; Creatinine, Ser 0.88; Potassium 4.4; Sodium 140  Recent Lipid Panel    Component Value Date/Time   CHOL 113 10/24/2021 1312   TRIG 47 10/24/2021 1312   HDL 44 10/24/2021 1312   CHOLHDL 2.6 10/24/2021 1312   LDLCALC 58 10/24/2021 1312   Home Medications   Current Meds  Medication Sig    buPROPion (WELLBUTRIN XL) 150 MG 24 hr tablet Take 1 tablet by mouth daily.   docusate sodium (COLACE) 100 MG capsule Take 100 mg by mouth 2 (two) times a week.   furosemide (LASIX) 20 MG tablet Take 1 tablet (20 mg total) by mouth daily.   metoprolol succinate (TOPROL XL) 25 MG 24 hr tablet Take 1 tablet (25 mg total) by mouth daily.   NON FORMULARY CPAP MACHINE QHS   polyethylene glycol (MIRALAX / GLYCOLAX) packet Take 17 g by mouth daily as needed for mild constipation.   XARELTO 15 MG TABS tablet Take 15 mg by mouth daily with supper. with food     Review of Systems      All other systems reviewed and are otherwise negative except as noted above.  Physical Exam    VS:  BP 104/64   Pulse 74   Ht 5' 10.5" (1.791 m)   Wt (!) 383 lb (173.7 kg)   SpO2 94%   BMI 54.18 kg/m  , BMI Body mass index is 54.18 kg/m.  Wt Readings from Last 3 Encounters:  12/16/21 (!) 383 lb (173.7 kg)  10/11/21 (!) 380 lb 12.8 oz (172.7 kg)  09/11/21 (!) 384 lb (174.2 kg)    GEN: Well nourished, overweight, well developed, in no acute distress. HEENT: normal. Neck: Supple, no JVD, carotid bruits, or masses. Cardiac: RRR, no murmurs, rubs, or gallops. No clubbing, cyanosis.  Nonpitting bilateral lower extremity edema.  Radials/PT 2+ and equal bilaterally.  Respiratory:  Respirations regular and unlabored, clear to auscultation bilaterally. GI: Soft, nontender, nondistended. MS: No deformity or atrophy. Skin: Warm and dry, no rash. Neuro:  Strength and sensation are intact. Psych: Normal affect.  Assessment & Plan    CAD -cardiac catheterization 09/11/2021 with nonobstructive disease. Continue rosuvastatin, metoprolol.  No chest pain.  No aspirin due to chronic anticoagulation.  Heart healthy diet and regular cardiovascular exercise encouraged.    Combined systolic and diastolic heart failure - Volume status difficult to ascertain due to body habitus.  His weight is up 3 pounds compared to last clinic  visit 2 mos ago.  GDMT will be limited by relative hypotension. GDMT includes Lasix 20 mg daily.  He is taking intermittently and encouraged to take daily as prescribed.  We will start spironolactone 25 mg daily with repeat BMP in 1 week.  Future considerations include SGLT2.  Low suspicion his blood pressure will tolerate Entresto. Low sodium diet, fluid restriction <2L, and daily weights encouraged. Educated to contact our office for weight gain of 2 lbs overnight or  5 lbs in one week.   Hx of PE and DVT / Hypercoagulable state - On Xarelto without bleeding complication.  Managed by PCP  HLD, LDL goal <70 - 10/24/21 total cholesterol 113, HDL 44, LDL 58, triglycerides 47. Continue  Crestor 20mg  QD. Denies myalgias.   OSA - CPAP compliance encouraged. Endorses regularly using.   Obesity - Weight loss via diet and exercise encouraged. Discussed the impact being overweight would have on cardiovascular risk. Unable to utilize [facility] as Medicare does not cover. No known hx of DM2, update A1c with follow-up labs in 1 week.  Disposition: Follow up in 2 month(s)with Skeet Latch, MD or APP.  Signed, Loel Dubonnet, NP 12/16/2021, 10:00 AM [place]

## 2021-12-16 ENCOUNTER — Ambulatory Visit (INDEPENDENT_AMBULATORY_CARE_PROVIDER_SITE_OTHER): Payer: No Typology Code available for payment source | Admitting: Family

## 2021-12-16 ENCOUNTER — Encounter (HOSPITAL_BASED_OUTPATIENT_CLINIC_OR_DEPARTMENT_OTHER): Payer: Self-pay | Admitting: Family

## 2021-12-16 VITALS — BP 104/64 | HR 74 | Ht 70.5 in | Wt 383.0 lb

## 2021-12-16 DIAGNOSIS — I2510 Atherosclerotic heart disease of native coronary artery without angina pectoris: Secondary | ICD-10-CM | POA: Diagnosis not present

## 2021-12-16 DIAGNOSIS — D6859 Other primary thrombophilia: Secondary | ICD-10-CM | POA: Diagnosis not present

## 2021-12-16 DIAGNOSIS — R739 Hyperglycemia, unspecified: Secondary | ICD-10-CM

## 2021-12-16 DIAGNOSIS — I5042 Chronic combined systolic (congestive) and diastolic (congestive) heart failure: Secondary | ICD-10-CM

## 2021-12-16 DIAGNOSIS — I25118 Atherosclerotic heart disease of native coronary artery with other forms of angina pectoris: Secondary | ICD-10-CM | POA: Diagnosis not present

## 2021-12-16 MED ORDER — SPIRONOLACTONE 25 MG PO TABS: 25.0000 mg | ORAL_TABLET | Freq: Every day | ORAL | 3 refills | Status: AC

## 2021-12-16 NOTE — Patient Instructions (Signed)
Medication Instructions:  Your physician has recommended you make the following change in your medication:   Start: Spironolactone 25mg  daily    *If you need a refill on your cardiac medications before your next appointment, please call your pharmacy*   Lab Work: Please return for Lab work in 1 week for BMET and A1C. You may come to the...   [facility] (3rd floor) [address], [place], [place]  Open: 8am-Noon and 1pm-4:30pm    [facility] at [facility] [address] [facility]- Any location  **no appointments needed**  If you have labs (blood work) drawn today and your tests are completely normal, you will receive your results only by: [place] (if you have MyChart) OR A paper copy in the mail If you have any lab test that is abnormal or we need to change your treatment, we will call you to review the results.   Testing/Procedures: None ordered today    Follow-Up: At [facility], you and your health needs are our priority.  As part of our continuing mission to provide you with exceptional heart care, we have created designated Provider Care Teams.  These Care Teams include your primary Cardiologist (physician) and Advanced Practice Providers (APPs -  Physician Assistants and Nurse Practitioners) who all work together to provide you with the care you need, when you need it.  We recommend signing up for the patient portal called "MyChart".  Sign up information is provided on this After Visit Summary.  MyChart is used to connect with patients for Virtual Visits (Telemedicine).  Patients are able to view lab/test results, encounter notes, upcoming appointments, etc.  Non-urgent messages can be sent to your provider as well.   To learn more about what you can do with MyChart, go to [facility].    Your next appointment:   2-3 month(s)  The format for your next appointment:   In Person  Provider:    [email or web address], MD or Chilton Si, NP{  Other Instructions Heart Healthy Diet Recommendations: A low-salt diet is recommended. Meats should be grilled, baked, or boiled. Avoid fried foods. Focus on lean protein sources like fish or chicken with vegetables and fruits. The American Heart Association is a Gillian Shields!  American Heart Association Diet and Lifeystyle Recommendations   Exercise recommendations: The American Heart Association recommends 150 minutes of moderate intensity exercise weekly. Try 30 minutes of moderate intensity exercise 4-5 times per week. This could include walking, jogging, or swimming.   Important Information About Sugar

## 2021-12-26 ENCOUNTER — Telehealth (HOSPITAL_BASED_OUTPATIENT_CLINIC_OR_DEPARTMENT_OTHER): Payer: Self-pay

## 2021-12-26 LAB — BASIC METABOLIC PANEL
BUN/Creatinine Ratio: 16 (ref 10–24)
BUN: 18 mg/dL (ref 8–27)
CO2: 22 mmol/L (ref 20–29)
Calcium: 9.2 mg/dL (ref 8.6–10.2)
Chloride: 104 mmol/L (ref 96–106)
Creatinine, Ser: 1.12 mg/dL (ref 0.76–1.27)
Glucose: 99 mg/dL (ref 70–99)
Potassium: 4.6 mmol/L (ref 3.5–5.2)
Sodium: 141 mmol/L (ref 134–144)
eGFR: 69 mL/min/{1.73_m2} (ref >59)

## 2021-12-26 LAB — HEMOGLOBIN A1C
Est. average glucose Bld gHb Est-mCnc: 123 mg/dL
Hgb A1c MFr Bld: 5.9 % — ABNORMAL HIGH (ref 4.8–5.6)

## 2021-12-26 NOTE — Telephone Encounter (Addendum)
Called results to patient and left results on VM (ok per DPR), instructions left to call office back if patient has any questions!      ----- Message from Loel Dubonnet, NP sent at 12/26/2021  7:11 AM EDT ----- A1c reveals prediabetes. Recommend increasing physical activity and reducing intake of carbohydrates, sugars. The American Diabetes Association has some fantastic low carb recipes on their website! Stable kidney function. Normal electrolytes. Good result! Continue current medications.

## 2022-02-16 NOTE — Progress Notes (Unsigned)
Office Visit    Patient Name: Richard Sandoval Date of Encounter: 02/18/2022  PCP:  Laurann Montana, MD   [facility]  Cardiologist:  Chilton Si, MD  Advanced Practice Provider:  No care team member to display Electrophysiologist:  None     Chief Complaint    Richard Sandoval is a 76 y.o. male with a hx of coronary disease, pulmonary embolism, hypertension, morbid obesity, OSA on CPAP, combined systolic and diastolic heart failure  presents today for heart failure follow-up   Past Medical History    Past Medical History:  Diagnosis Date   Acute on chronic combined systolic and diastolic CHF (congestive heart failure) (HCC) 09/06/2021   CAD in native artery 09/06/2021   DVT (deep venous thrombosis) (HCC)    L leg in 2009   Dyspnea    GERD (gastroesophageal reflux disease)    Hypertension    enlarged heart   Morbid obesity (HCC)    OSA (obstructive sleep apnea)    on CPAP since 1995   Pancreatitis    Pulmonary hypertension (HCC) 06/04/2017   Right heart failure (HCC) 08/27/2017   Past Surgical History:  Procedure Laterality Date   BACK SURGERY  04/09/1999   cervical laminectomy and fusion     CHOLECYSTECTOMY N/A 01/25/2014   Procedure: LAPAROSCOPIC CHOLECYSTECTOMY WITH INTRAOPERATIVE CHOLANGIOGRAM;  Surgeon: Velora Heckler, MD;  Location: [facility];  Service: General;  Laterality: N/A;   COLONOSCOPY     COLONOSCOPY WITH PROPOFOL N/A 03/04/2019   Procedure: COLONOSCOPY WITH PROPOFOL;  Surgeon: Charlott Rakes, MD;  Location: [facility];  Service: Endoscopy;  Laterality: N/A;   EUS N/A 01/24/2014   Procedure: UPPER ENDOSCOPIC ULTRASOUND (EUS) RADIAL;  Surgeon: Willis Modena, MD;  Location: [facility];  Service: Endoscopy;  Laterality: N/A;   INTRAVASCULAR PRESSURE WIRE/FFR STUDY N/A 09/11/2021   Procedure: INTRAVASCULAR PRESSURE WIRE/FFR STUDY;  Surgeon: Orbie Pyo, MD;  Location: [facility];  Service: Cardiovascular;  Laterality: N/A;   KNEE  SURGERY  1998   left inguinal hernia     LUMBAR LAMINECTOMY     RIGHT/LEFT HEART CATH AND CORONARY ANGIOGRAPHY N/A 09/11/2021   Procedure: RIGHT/LEFT HEART CATH AND CORONARY ANGIOGRAPHY;  Surgeon: Orbie Pyo, MD;  Location: [facility];  Service: Cardiovascular;  Laterality: N/A;   TRIGGER FINGER RELEASE  08/04/2012   Procedure: MINOR RELEASE TRIGGER FINGER/A-1 PULLEY;  Surgeon: Nicki Reaper, MD;  Location: [facility];  Service: Orthopedics;  Laterality: Right;    Allergies  Allergies  Allergen Reactions   Ace Inhibitors     Other reaction(s): cough   Penicillins Itching and Other (See Comments)    Has patient had a PCN reaction causing immediate rash, facial/tongue/throat swelling, SOB or lightheadedness with hypotension: No Has patient had a PCN reaction causing severe rash involving mucus membranes or skin necrosis: No Has patient had a PCN reaction that required hospitalization No Has patient had a PCN reaction occurring within the last 10 years: No If all of the above answers are "NO", then may proceed with Cephalosporin use.    History of Present Illness    Richard Sandoval is a 76 y.o. male with a hx of coronary disease, pulmonary embolism, hypertension, morbid obesity, OSA on CPAP, combined systolic and diastolic heart failure  last seen 12/16/21  He was initially seen in November 2018 for exertional dyspnea which was ongoing since back surgery in 2000.  July 2017 lower extremity DVT and PE.  Echo July 2017 LVEF 50 to 55%, grade 1 diastolic dysfunction, PASP 52 mmHg.  Echo November 2018 LVEF 55 to 60%, normal diastolic function.  He was seen in clinic noting successful weight loss of 60 pounds with exertional dyspnea and was recommended for echo performed January 2023 revealing LVEF 45 to 50%, grade 1 diastolic dysfunction, ascending aorta mildly dilated at 4.3 cm.  Coronary CTA was technically difficult the concern for moderate mid LAD stenosis and left main  stenosis.  He was recommended for cardiac catheterization performed 09/11/2021 revealing mild coronary disease (first March 30%, proximal LAD 20%, distal LAD 30%, mid LM 30%) recommended for medical therapy. His cardiac output was 8 L/min, wedge pressure 12 mmHg, LVEDP unable to be assessed due to radial and brachial vasospasm.   Clinic visit 10/11/21 noted to be taking Lasix sporadically due to frequency and it was adjusted to 20mg  QD. Metoprolol succinate initiated for dual heart failure and coronary disease benefit.   At follow up 12/16/21 noted stable exertional dyspnea, mild LE edema if he missed doses of Lasix. Spironolactone added for optimization of medical therapy.   Pleasant gentleman who presents today for follow up. 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  Follows a low-sodium diet and eats most of his meals at home.  He is drinking 3 to 4 sixteen ounce bottles of water per day-following less than 2 L of fluid intake recommendation. Tells me he started Spironolactone and it was working fine with his schedule, but has not taken it in the last month. Did not understand why he was taking two fluid pills and we reviewed Spironolactone and Lasix in detail. He is taking Lasix only on days he is able to be at home, about 2-3 times per week. Has been focusing on his wife's health as well as helping watch his grandchildren who are 94, 58, and 72 years old.   EKGs/Labs/Other Studies Reviewed:   The following studies were reviewed today:  Right/left heart cath 09/11/2021 1st Mrg lesion is 30% stenosed.   Prox LAD lesion is 20% stenosed.   Dist LAD lesion is 30% stenosed.   Mid LM lesion is 30% stenosed.   1.  Mild obstructive coronary artery disease with RFR negative left main disease. 2.  Cardiac output of 8 L/min and an index of 2.9 L/min with wedge pressure of 12 mmHg and RA pressure of 13 mmHg; LVEDP was unable to be assessed due to radial and brachial  vasospasm.   Recommendations: Medical therapy.   Echo 07/31/21: 1. Left ventricular ejection fraction, by estimation, is 45 to 50%. The  left ventricle has mildly decreased function. Left ventricular endocardial  border not optimally defined to evaluate regional wall motion. The left  ventricular internal cavity size  was mildly dilated. Left ventricular diastolic parameters are consistent  with Grade I diastolic dysfunction (impaired relaxation).   2. Right ventricular systolic function is normal. The right ventricular  size is normal. There is normal pulmonary artery systolic pressure.   3. Left atrial size was moderately dilated.   4. The mitral valve was not well visualized. No evidence of mitral valve  regurgitation. No evidence of mitral stenosis.   5. The aortic valve was not well visualized. Aortic valve regurgitation  is not visualized. No aortic stenosis is present.   6. Aortic dilatation noted. There is mild dilatation of the ascending  aorta, measuring 43 mm.    Coronary CT-A 08/01/21: IMPRESSION: 1.  Coronary artery calcium score 1476 Agatston units. This places the patient in the 86th percentile for age and gender, suggesting high risk for future cardiac events.   2. Technically difficult study significantly affected by motion artifact. Quality of study too low for FFR analysis. The LCx and RCA do not appear to have signficant stenoses. The left main and proximal LAD may have moderate stenoses. Would suggest cardiolite to assess for ischemia.  EKG: No EKG today  Recent Labs: 09/06/2021: Platelets 184 09/11/2021: Hemoglobin 14.6 10/24/2021: ALT 12 12/25/2021: BUN 18; Creatinine, Ser 1.12; Potassium 4.6; Sodium 141  Recent Lipid Panel    Component Value Date/Time   CHOL 113 10/24/2021 1312   TRIG 47 10/24/2021 1312   HDL 44 10/24/2021 1312   CHOLHDL 2.6 10/24/2021 1312   LDLCALC 58 10/24/2021 1312   Home Medications   Current Meds  Medication Sig   docusate  sodium (COLACE) 100 MG capsule Take 100 mg by mouth 2 (two) times a week.   furosemide (LASIX) 20 MG tablet Take 1 tablet (20 mg total) by mouth daily.   metoprolol succinate (TOPROL XL) 25 MG 24 hr tablet Take 1 tablet (25 mg total) by mouth daily.   NON FORMULARY CPAP MACHINE QHS   polyethylene glycol (MIRALAX / GLYCOLAX) packet Take 17 g by mouth daily as needed for mild constipation.   XARELTO 15 MG TABS tablet Take 15 mg by mouth daily with supper. with food     Review of Systems      All other systems reviewed and are otherwise negative except as noted above.  Physical Exam    VS:  BP 118/70   Pulse 77   Ht 5' 10.5" (1.791 m)   Wt (!) 382 lb (173.3 kg)   BMI 54.04 kg/m  , BMI Body mass index is 54.04 kg/m.  Wt Readings from Last 3 Encounters:  02/17/22 (!) 382 lb (173.3 kg)  12/16/21 (!) 383 lb (173.7 kg)  10/11/21 (!) 380 lb 12.8 oz (172.7 kg)    GEN: Well nourished, overweight, well developed, in no acute distress. HEENT: normal. Neck: Supple, no JVD, carotid bruits, or masses. Cardiac: RRR, no murmurs, rubs, or gallops. No clubbing, cyanosis.  Nonpitting bilateral lower extremity edema.  Radials/PT 2+ and equal bilaterally.  Respiratory:  Respirations regular and unlabored, clear to auscultation bilaterally. GI: Soft, nontender, nondistended. MS: No deformity or atrophy. Skin: Warm and dry, no rash. Neuro:  Strength and sensation are intact. Psych: Normal affect.  Assessment & Plan    CAD -cardiac catheterization 09/11/2021 with nonobstructive disease. Continue rosuvastatin, metoprolol.  No chest pain.  No aspirin due to chronic anticoagulation.  Heart healthy diet and regular cardiovascular exercise encouraged.    Combined systolic and diastolic heart failure - Echo 07/6107 LVEF 45-50%. Volume status difficult to ascertain due to body habitus.  Weight down 1 pound compared to clinic visit 2 mos ago. Not taking Spironolactone. Taking Lasix only a few times per week.   Reiterated the importance of taking both spironolactone and Lasix regularly to maintain volume status.   Future considerations include SGLT2.  Low suspicion his blood pressure will tolerate Entresto. Low sodium diet, fluid restriction <2L, and daily weights encouraged. Educated to contact our office for weight gain of 2 lbs overnight or 5 lbs in one week.   Hx of PE and DVT / Hypercoagulable state - On Xarelto without bleeding complication.  Managed by PCP. Denies bleeding complications.   HLD, LDL goal <70 - 10/24/21  total cholesterol 113, HDL 44, LDL 58, triglycerides 47. Continue  Crestor 20mg  QD. Denies myalgias.   OSA - CPAP compliance encouraged. Endorses regularly using.   Obesity - Weight loss via diet and exercise encouraged. Discussed the impact being overweight would have on cardiovascular risk. Unable to utilize [facility] as Medicare does not cover. Does not qualify for Ozempic as he is not diabetic.   Disposition: Follow up in 3-4 months with [facility], MD or APP.  Signed, Chilton Si, NP 02/18/2022, 7:53 AM [facility]

## 2022-02-17 ENCOUNTER — Encounter (HOSPITAL_BASED_OUTPATIENT_CLINIC_OR_DEPARTMENT_OTHER): Payer: Self-pay | Admitting: Family

## 2022-02-17 ENCOUNTER — Ambulatory Visit (INDEPENDENT_AMBULATORY_CARE_PROVIDER_SITE_OTHER): Payer: No Typology Code available for payment source | Admitting: Family

## 2022-02-17 VITALS — BP 118/70 | HR 77 | Ht 70.5 in | Wt 382.0 lb

## 2022-02-17 DIAGNOSIS — I25118 Atherosclerotic heart disease of native coronary artery with other forms of angina pectoris: Secondary | ICD-10-CM | POA: Diagnosis not present

## 2022-02-17 DIAGNOSIS — I5042 Chronic combined systolic (congestive) and diastolic (congestive) heart failure: Secondary | ICD-10-CM

## 2022-02-17 DIAGNOSIS — Z86711 Personal history of pulmonary embolism: Secondary | ICD-10-CM

## 2022-02-17 DIAGNOSIS — Z86718 Personal history of other venous thrombosis and embolism: Secondary | ICD-10-CM | POA: Diagnosis not present

## 2022-02-17 DIAGNOSIS — D6859 Other primary thrombophilia: Secondary | ICD-10-CM | POA: Diagnosis not present

## 2022-02-17 DIAGNOSIS — G4733 Obstructive sleep apnea (adult) (pediatric): Secondary | ICD-10-CM | POA: Diagnosis not present

## 2022-02-17 NOTE — Patient Instructions (Signed)
Medication Instructions:  Your physician has recommended you make the following change in your medication:    RESUME Spironolactone one 25mg  tablet daily  CONTINUE Furosemide (Lasix) one 20mg  tablet daily  These two  medications work together to help keep fluid off, help your heart work more efficiently, prevent shortness of breath, prevent swelling.   *If you need a refill on your cardiac medications before your next appointment, please call your pharmacy*   Lab Work: None ordered today.   Testing/Procedures: None ordered today.   Follow-Up: At [facility], you and your health needs are our priority.  As part of our continuing mission to provide you with exceptional heart care, we have created designated Provider Care Teams.  These Care Teams include your primary Cardiologist (physician) and Advanced Practice Providers (APPs -  Physician Assistants and Nurse Practitioners) who all work together to provide you with the care you need, when you need it.  We recommend signing up for the patient portal called "MyChart".  Sign up information is provided on this After Visit Summary.  MyChart is used to connect with patients for Virtual Visits (Telemedicine).  Patients are able to view lab/test results, encounter notes, upcoming appointments, etc.  Non-urgent messages can be sent to your provider as well.   To learn more about what you can do with MyChart, go to .    Your next appointment:   3-4 month(s)  The format for your next appointment:   In Person  Provider:   [facility], MD    Other Instructions  Heart Healthy Diet Recommendations: A low-salt diet is recommended. Meats should be grilled, baked, or boiled. Avoid fried foods. Focus on lean protein sources like fish or chicken with vegetables and fruits. The American Heart Association is a [employer]!  American Heart Association Diet and Lifeystyle Recommendations  -Drink less than 2 liters of  fluid intake per day (64 oz)  Exercise recommendations: The American Heart Association recommends 150 minutes of moderate intensity exercise weekly. Try 30 minutes of moderate intensity exercise 4-5 times per week. This could include walking, jogging, or swimming.  Important Information About Sugar

## 2022-02-18 ENCOUNTER — Encounter (HOSPITAL_BASED_OUTPATIENT_CLINIC_OR_DEPARTMENT_OTHER): Payer: Self-pay | Admitting: Family

## 2022-02-20 DIAGNOSIS — G4733 Obstructive sleep apnea (adult) (pediatric): Secondary | ICD-10-CM | POA: Diagnosis not present

## 2022-03-14 DIAGNOSIS — E785 Hyperlipidemia, unspecified: Secondary | ICD-10-CM | POA: Diagnosis not present

## 2022-03-14 DIAGNOSIS — F331 Major depressive disorder, recurrent, moderate: Secondary | ICD-10-CM | POA: Diagnosis not present

## 2022-03-14 DIAGNOSIS — Z7901 Long term (current) use of anticoagulants: Secondary | ICD-10-CM | POA: Diagnosis not present

## 2022-03-14 DIAGNOSIS — Z86711 Personal history of pulmonary embolism: Secondary | ICD-10-CM | POA: Diagnosis not present

## 2022-03-14 DIAGNOSIS — R609 Edema, unspecified: Secondary | ICD-10-CM | POA: Diagnosis not present

## 2022-03-14 DIAGNOSIS — I5042 Chronic combined systolic (congestive) and diastolic (congestive) heart failure: Secondary | ICD-10-CM | POA: Diagnosis not present

## 2022-03-14 DIAGNOSIS — I2510 Atherosclerotic heart disease of native coronary artery without angina pectoris: Secondary | ICD-10-CM | POA: Diagnosis not present

## 2022-03-14 DIAGNOSIS — I272 Pulmonary hypertension, unspecified: Secondary | ICD-10-CM | POA: Diagnosis not present

## 2022-04-10 ENCOUNTER — Other Ambulatory Visit (HOSPITAL_BASED_OUTPATIENT_CLINIC_OR_DEPARTMENT_OTHER): Payer: Self-pay | Admitting: Family

## 2022-04-10 DIAGNOSIS — I5042 Chronic combined systolic (congestive) and diastolic (congestive) heart failure: Secondary | ICD-10-CM

## 2022-04-10 NOTE — Telephone Encounter (Signed)
Rx request sent to pharmacy.  

## 2022-05-21 ENCOUNTER — Ambulatory Visit (INDEPENDENT_AMBULATORY_CARE_PROVIDER_SITE_OTHER): Payer: No Typology Code available for payment source | Admitting: Cardiovascular Disease

## 2022-05-21 ENCOUNTER — Encounter (HOSPITAL_BASED_OUTPATIENT_CLINIC_OR_DEPARTMENT_OTHER): Payer: Self-pay | Admitting: Cardiovascular Disease

## 2022-05-21 DIAGNOSIS — I2510 Atherosclerotic heart disease of native coronary artery without angina pectoris: Secondary | ICD-10-CM

## 2022-05-21 DIAGNOSIS — I5042 Chronic combined systolic (congestive) and diastolic (congestive) heart failure: Secondary | ICD-10-CM

## 2022-05-21 MED ORDER — ROSUVASTATIN CALCIUM 20 MG PO TABS: 20.0000 mg | ORAL_TABLET | Freq: Every day | ORAL | 3 refills | Status: DC

## 2022-05-21 NOTE — Assessment & Plan Note (Signed)
He continues to lose weight.  He notes that his appetite has been very low lately.  He is on Wellbutrin which is likely contributing.  Encouraged him to try to exercise is much as possible.  He is limited by back pain and gait instability.

## 2022-05-21 NOTE — Assessment & Plan Note (Signed)
LVEF 45 to 50%.  Nonischemic cardiomyopathy.  He had minimal disease at cath.  Volume status has been stable.  Continue metoprolol, spironolactone and Lasix.  Recommend repeat echo at follow-up.  Did not discuss ARB.  Will need to consider at f/u.

## 2022-05-21 NOTE — Assessment & Plan Note (Signed)
Non-obstructive CAD on cath.  He is asymptomatic.  Unclear why his rosuvastatin was stopped.  Lipids were well controlled.  He will resume.  He is not on aspirin given that he is on Xarelto.

## 2022-05-21 NOTE — Progress Notes (Signed)
Cardiology Office Note  Date:  05/21/2022   ID:  Richard Sandoval, DOB April 10, 1946, MRN [id number]  PCP:  Laurann Montana, MD  Cardiologist:   Chilton Si, MD   No chief complaint on file.   History of Present Illness: Richard Sandoval is a 76 y.o. male with prior pulmonary embolism, hypertension, morbid obesity, OSA on CPAP here for follow up.  He was initially seen 05/2017 for the evaluation of chronic dyspnea on exertion.  [name] reports many years of shortness of breath.  This dates back to 2000 after he had surgery on his back.  Since that time he continues to have pain and has not been able to be very physically active.  He has noted progressive dyspnea on exertion.  In July 2017 he had a left lower extremity DVT and pulmonary embolism.  [name] had an echo 02/21/16 that revealed LVEF 50-55% with grade 1 diastolic dysfunction.  PASP was 52 mmHg.  He thinks that the shortness of breath has gotten worse since that time.  He still has lower extremity edema that is worse in the left than the right.  This started after developing cellulitis in that leg.  He has OSA and uses CPAP nightly.  He never smoked but has been exposed to secondhand smoke.  Since his last appointment [name] was referred for an echo 06/11/17 that revealed LVEF 55-60% with normal diastolic function.  [name] had lumbar decompression surgery 11/2017.  He reported success with losing weight (60 pounds) but continued to have exertional dyspnea that seem to be getting worse.  He had an echo 07/2021 that revealed LVEF 45 to 50% and grade 1 diastolic dysfunction.  His right ventricular function and pressure were normal.  Ascending aorta was mildly dilated at 4.3 cm.  He had a coronary CTA that was technically difficult.  However there was concern for moderate mid LAD stenosis and 50 to 60% left main stenosis.  There is also mild proximal RCA stenosis.  The study was not high enough quality for FFR analysis.  He had noted that  the toes on his L foot have been darker.  He also gets pain in his feet when he first stands in the AM. He was referred for left and right heart cath 08/2021 which revealed 20-30% stenoses in all arteries. Right atrial pressure was 13 mmHg, RV 34/8, PA 30/11, PA mean 24, PCWP 12. They were unable to obtain LVEDP. Cardiac output was 8 liters, and cardiac index was 2.9. He followed up with Gillian Shields, NP and was doing well. Metoprolol was added and Lasix was reduced to 20 mg because he was struggling with frequent urination. At follow-up he continued to take his Lasix only intermittently so he was switched to spironolactone.  Today, he complains of constipation that he believes is caused by his medications. He takes them all daily and is not sure which is the culprit. For 2 days he may have normal bowel movements, but then have none for 5 days. He denies any hematochezia, pain, or straining. He has tried using stool softeners and drinks plenty of water. Additionally he endorses ongoing but stable LE edema (L>R). Also he complains of back pain. Lately he has had near falls, usually mechanical due to tripping over objects. For long walks he uses his walker which helps to alleviate some of his pain. Otherwise he will walk with a cane. He confirms he is staying busy and active. He has lost weight from his  peak of 448 lbs. In clinic today he weighs 378 lbs. Of note, he has noticed a loss of appetite. If he orders a 6 inch sub, he will only eat half before satiety. On a few occasions he has woken up gasping for breath, most recently one month ago. He believes this may be associated with sleeping in a certain position or dreaming. Reportedly his last sleep study was 5 years ago. Usually he does feel well rested while using his CPAP. He denies any palpitations, chest pain, or shortness of breath. No lightheadedness, headaches, syncope.  Past Medical History:  Diagnosis Date   Acute on chronic combined systolic and  diastolic CHF (congestive heart failure) ([place]) 09/06/2021   CAD in native artery 09/06/2021   Chronic combined systolic and diastolic heart failure ([place]) 09/06/2021   DVT (deep venous thrombosis) (H[facility])    L leg in 2009   Dyspnea    GERD (gastroesophageal reflux disease)    Hypertension    enlarged heart   Morbid obesity ([place])    OSA (obstructive sleep apnea)    on CPAP since 1995   Pancreatitis    Pulmonary hypertension ([place]) 06/04/2017   Right heart failure ([place]) 08/27/2017    Past Surgical History:  Procedure Laterality Date   BACK SURGERY  04/09/1999   cervical laminectomy and fusion     CHOLECYSTECTOMY N/A 01/25/2014   Procedure: LAPAROSCOPIC CHOLECYSTECTOMY WITH INTRAOPERATIVE CHOLANGIOGRAM;  Surgeon: Earnstine Regal, MD;  Location: [facility];  Service: General;  Laterality: N/A;   COLONOSCOPY     COLONOSCOPY WITH PROPOFOL N/A 03/04/2019   Procedure: COLONOSCOPY WITH PROPOFOL;  Surgeon: Wilford Corner, MD;  Location: [facility];  Service: Endoscopy;  Laterality: N/A;   EUS N/A 01/24/2014   Procedure: UPPER ENDOSCOPIC ULTRASOUND (EUS) RADIAL;  Surgeon: Arta Silence, MD;  Location: [facility];  Service: Endoscopy;  Laterality: N/A;   INTRAVASCULAR PRESSURE WIRE/FFR STUDY N/A 09/11/2021   Procedure: INTRAVASCULAR PRESSURE WIRE/FFR STUDY;  Surgeon: Early Osmond, MD;  Location: [facility];  Service: Cardiovascular;  Laterality: N/A;   KNEE SURGERY  1998   left inguinal hernia     LUMBAR LAMINECTOMY     RIGHT/LEFT HEART CATH AND CORONARY ANGIOGRAPHY N/A 09/11/2021   Procedure: RIGHT/LEFT HEART CATH AND CORONARY ANGIOGRAPHY;  Surgeon: Early Osmond, MD;  Location: [facility];  Service: Cardiovascular;  Laterality: N/A;   TRIGGER FINGER RELEASE  08/04/2012   Procedure: MINOR RELEASE TRIGGER FINGER/A-1 PULLEY;  Surgeon: Wynonia Sours, MD;  Location: [facility];  Service: Orthopedics;  Laterality: Right;     Current Outpatient Medications  Medication Sig  Dispense Refill   buPROPion (WELLBUTRIN XL) 150 MG 24 hr tablet Take 1 tablet by mouth daily.     NON FORMULARY CPAP MACHINE QHS     polyethylene glycol (MIRALAX / GLYCOLAX) packet Take 17 g by mouth daily as needed for mild constipation.     rosuvastatin (CRESTOR) 20 MG tablet Take 1 tablet (20 mg total) by mouth daily. 90 tablet 3   spironolactone (ALDACTONE) 25 MG tablet Take 1 tablet (25 mg total) by mouth daily. 90 tablet 3   XARELTO 15 MG TABS tablet Take 15 mg by mouth daily with supper. with food     furosemide (LASIX) 20 MG tablet Take 1 tablet (20 mg total) by mouth daily. 90 tablet 1   No current facility-administered medications for this visit.    Allergies:   Ace inhibitors and Penicillins  Social History:  The patient  reports that he has never smoked. He has never used smokeless tobacco. He reports that he does not drink alcohol and does not use drugs.   Family History:  The patient's family history includes Breast cancer in his mother; Diabetes in his sister; Gout in his father; Heart disease in his brother; Hypertension in his father.   ROS:   Please see the history of present illness.    (+) Constipation (+) LE edema (+) Back pain (+) Near falls, mechanical (+) Loss of appetite (+) Rare PND All other systems are reviewed and negative.    PHYSICAL EXAM:  VS:  BP 122/78   Pulse (!) 56   Ht 5' 10.5" (1.791 m)   Wt (!) 378 lb (171.5 kg)   BMI 53.47 kg/m  , BMI Body mass index is 53.47 kg/m. GENERAL:  Well appearing HEENT: Pupils equal round and reactive, fundi not visualized, oral mucosa unremarkable NECK:  No jugular venous distention, waveform within normal limits, carotid upstroke brisk and symmetric, no bruits LUNGS:  Clear to auscultation bilaterally HEART:  RRR.  PMI not displaced or sustained,S1 and S2 within normal limits, no S3, no S4, no clicks, no rubs, no murmurs ABD:  Flat, positive bowel sounds normal in frequency in pitch, no bruits, no  rebound, no guarding, no midline pulsatile mass, no hepatomegaly, no splenomegaly EXT:  2 plus pulses throughout, 2+ L LE edema to above the ankle bilaterally.  No cyanosis no clubbing SKIN: Hyperpigmentation of the left foot and left anterior tibia NEURO:  Cranial nerves II through XII grossly intact, motor grossly intact throughout PSYCH:  Cognitively intact, oriented to person place and time   EKG:  EKG is personally reviewed. 05/21/2022:  EKG was not ordered. 07/12/2021: Sinus rhythm.  Rate 92 bpm.  LAFB. 07/12/20: Sinus rhythm.  Rate  74 bpm.  PACs.  LAD.  Poor R wave progression. 07/01/19: Sinus rhythm.  Rate 68 bpm.  PACs.  04/20/18: Sinus rhythm.  Rate 78 bpm.  PVC.  Low voltage precordial leads.  LAFB 06/04/17: sinus rhythm.  Rate 68 bpm.  PACs.  LAFB.     Right/Left Heart Cath  09/11/2021:   1st Mrg lesion is 30% stenosed.   Prox LAD lesion is 20% stenosed.   Dist LAD lesion is 30% stenosed.   Mid LM lesion is 30% stenosed.   1.  Mild obstructive coronary artery disease with RFR negative left main disease. 2.  Cardiac output of 8 L/min and an index of 2.9 L/min with wedge pressure of 12 mmHg and RA pressure of 13 mmHg; LVEDP was unable to be assessed due to radial and brachial vasospasm.   Recommendations: Medical therapy.  Diagnostic Dominance: Right   Coronary CT-A 08/01/21: IMPRESSION: 1. Coronary artery calcium score 1476 Agatston units. This places the patient in the 86th percentile for age and gender, suggesting high risk for future cardiac events.   2. Technically difficult study significantly affected by motion artifact. Quality of study too low for FFR analysis. The LCx and RCA do not appear to have signficant stenoses. The left main and proximal LAD may have moderate stenoses. Would suggest cardiolite to assess for ischemia.  Echo 07/31/21: 1. Left ventricular ejection fraction, by estimation, is 45 to 50%. The  left ventricle has mildly decreased function.  Left ventricular endocardial  border not optimally defined to evaluate regional wall motion. The left  ventricular internal cavity size  was mildly dilated. Left ventricular diastolic parameters  are consistent  with Grade I diastolic dysfunction (impaired relaxation).   2. Right ventricular systolic function is normal. The right ventricular  size is normal. There is normal pulmonary artery systolic pressure.   3. Left atrial size was moderately dilated.   4. The mitral valve was not well visualized. No evidence of mitral valve  regurgitation. No evidence of mitral stenosis.   5. The aortic valve was not well visualized. Aortic valve regurgitation  is not visualized. No aortic stenosis is present.   6. Aortic dilatation noted. There is mild dilatation of the ascending  aorta, measuring 43 mm.   Recent Labs: 09/06/2021: Platelets 184 09/11/2021: Hemoglobin 14.6 10/24/2021: ALT 12 12/25/2021: BUN 18; Creatinine, Ser 1.12; Potassium 4.6; Sodium 141   02/09/17: WBC 5.1, hemoglobin 15, hematocrit 45.1, platelets 191 Sodium 139, potassium 4.5, BUN 18, creatinine 1.0 AST 23, ALT 15 Total cholesterol 165, triglycerides 47, HDL 37, LDL 119 next TSH 1.77  Lipid Panel    Component Value Date/Time   CHOL 113 10/24/2021 1312   TRIG 47 10/24/2021 1312   HDL 44 10/24/2021 1312   CHOLHDL 2.6 10/24/2021 1312   LDLCALC 58 10/24/2021 1312      Wt Readings from Last 3 Encounters:  05/21/22 (!) 378 lb (171.5 kg)  02/17/22 (!) 382 lb (173.3 kg)  12/16/21 (!) 383 lb (173.7 kg)      ASSESSMENT AND PLAN:  CAD in native artery Non-obstructive CAD on cath.  He is asymptomatic.  Unclear why his rosuvastatin was stopped.  Lipids were well controlled.  He will resume.  He is not on aspirin given that he is on Xarelto.  Chronic combined systolic and diastolic heart failure (HCC) LVEF 45 to 50%.  Nonischemic cardiomyopathy.  He had minimal disease at cath.  Volume status has been stable.  Continue  metoprolol, spironolactone and Lasix.  Recommend repeat echo at follow-up.  Did not discuss ARB.  Will need to consider at f/u.  Morbid obesity (HCC) He continues to lose weight.  He notes that his appetite has been very low lately.  He is on Wellbutrin which is likely contributing.  Encouraged him to try to exercise is much as possible.  He is limited by back pain and gait instability.   Disposition:   FU with Lorenzo Pereyra C. Duke Salvia, MD, [facility]  PRN.  Current medicines are reviewed at length with the patient today.  The patient does not have concerns regarding medicines.  The following changes have been made: none  Labs/ tests ordered today include:   No orders of the defined types were placed in this encounter.   I,Mathew Stumpf,acting as a Neurosurgeon for Chilton Si, MD.,have documented all relevant documentation on the behalf of Chilton Si, MD,as directed by  Chilton Si, MD while in the presence of Chilton Si, MD.  I, Janyla Biscoe C. Duke Salvia, MD have reviewed all documentation for this visit.  The documentation of the exam, diagnosis, procedures, and orders on 05/21/2022 are all accurate and complete.   Signed, Kashus Karlen C. Duke Salvia, MD, [facility]  05/21/2022 10:48 AM    [facility]

## 2022-05-21 NOTE — Patient Instructions (Signed)
Medication Instructions:  Your physician has recommended you make the following change in your medication:   Start: Rosuvastatin 20mg  daily   *If you need a refill on your cardiac medications before your next appointment, please call your pharmacy*  Follow-Up: At [facility], you and your health needs are our priority.  As part of our continuing mission to provide you with exceptional heart care, we have created designated Provider Care Teams.  These Care Teams include your primary Cardiologist (physician) and Advanced Practice Providers (APPs -  Physician Assistants and Nurse Practitioners) who all work together to provide you with the care you need, when you need it.  We recommend signing up for the patient portal called "MyChart".  Sign up information is provided on this After Visit Summary.  MyChart is used to connect with patients for Virtual Visits (Telemedicine).  Patients are able to view lab/test results, encounter notes, upcoming appointments, etc.  Non-urgent messages can be sent to your provider as well.   To learn more about what you can do with MyChart, go to [email or web address].    Your next appointment:   Please follow up in one year with Dr. Oval Linsey- We will send you a letter when it is time to schedule

## 2022-06-23 DIAGNOSIS — I5042 Chronic combined systolic (congestive) and diastolic (congestive) heart failure: Secondary | ICD-10-CM | POA: Diagnosis not present

## 2022-06-23 DIAGNOSIS — F331 Major depressive disorder, recurrent, moderate: Secondary | ICD-10-CM | POA: Diagnosis not present

## 2022-06-23 DIAGNOSIS — R609 Edema, unspecified: Secondary | ICD-10-CM | POA: Diagnosis not present

## 2022-06-23 DIAGNOSIS — Z86711 Personal history of pulmonary embolism: Secondary | ICD-10-CM | POA: Diagnosis not present

## 2022-06-23 DIAGNOSIS — I2510 Atherosclerotic heart disease of native coronary artery without angina pectoris: Secondary | ICD-10-CM | POA: Diagnosis not present

## 2022-06-23 DIAGNOSIS — K429 Umbilical hernia without obstruction or gangrene: Secondary | ICD-10-CM | POA: Diagnosis not present

## 2022-06-23 DIAGNOSIS — E785 Hyperlipidemia, unspecified: Secondary | ICD-10-CM | POA: Diagnosis not present

## 2022-06-23 DIAGNOSIS — H539 Unspecified visual disturbance: Secondary | ICD-10-CM | POA: Diagnosis not present

## 2022-06-23 DIAGNOSIS — Z7901 Long term (current) use of anticoagulants: Secondary | ICD-10-CM | POA: Diagnosis not present

## 2023-01-02 DIAGNOSIS — R413 Other amnesia: Secondary | ICD-10-CM | POA: Diagnosis not present

## 2023-01-02 DIAGNOSIS — I5042 Chronic combined systolic (congestive) and diastolic (congestive) heart failure: Secondary | ICD-10-CM | POA: Diagnosis not present

## 2023-01-02 DIAGNOSIS — R3911 Hesitancy of micturition: Secondary | ICD-10-CM | POA: Diagnosis not present

## 2023-01-02 DIAGNOSIS — M5136 Other intervertebral disc degeneration, lumbar region: Secondary | ICD-10-CM | POA: Diagnosis not present

## 2023-01-02 DIAGNOSIS — N401 Enlarged prostate with lower urinary tract symptoms: Secondary | ICD-10-CM | POA: Diagnosis not present

## 2023-01-02 DIAGNOSIS — K5909 Other constipation: Secondary | ICD-10-CM | POA: Diagnosis not present

## 2023-01-02 DIAGNOSIS — I2510 Atherosclerotic heart disease of native coronary artery without angina pectoris: Secondary | ICD-10-CM | POA: Diagnosis not present

## 2023-01-02 DIAGNOSIS — F331 Major depressive disorder, recurrent, moderate: Secondary | ICD-10-CM | POA: Diagnosis not present

## 2023-02-25 DIAGNOSIS — G4733 Obstructive sleep apnea (adult) (pediatric): Secondary | ICD-10-CM | POA: Diagnosis not present

## 2023-03-18 DIAGNOSIS — G4733 Obstructive sleep apnea (adult) (pediatric): Secondary | ICD-10-CM | POA: Diagnosis not present

## 2023-04-02 DIAGNOSIS — F331 Major depressive disorder, recurrent, moderate: Secondary | ICD-10-CM | POA: Diagnosis not present

## 2023-04-02 DIAGNOSIS — K5900 Constipation, unspecified: Secondary | ICD-10-CM | POA: Diagnosis not present

## 2023-04-02 DIAGNOSIS — I5042 Chronic combined systolic (congestive) and diastolic (congestive) heart failure: Secondary | ICD-10-CM | POA: Diagnosis not present

## 2023-04-02 DIAGNOSIS — N400 Enlarged prostate without lower urinary tract symptoms: Secondary | ICD-10-CM | POA: Diagnosis not present

## 2023-04-02 DIAGNOSIS — Z23 Encounter for immunization: Secondary | ICD-10-CM | POA: Diagnosis not present

## 2023-04-22 DIAGNOSIS — Z23 Encounter for immunization: Secondary | ICD-10-CM | POA: Diagnosis not present

## 2023-04-27 ENCOUNTER — Emergency Department (HOSPITAL_COMMUNITY): Payer: No Typology Code available for payment source

## 2023-04-27 ENCOUNTER — Emergency Department (HOSPITAL_COMMUNITY)
Admission: EM | Admit: 2023-04-27 | Discharge: 2023-04-27 | Disposition: A | Discharge status: Home / Self Care | Payer: No Typology Code available for payment source | Attending: Emergency Medicine | Admitting: Emergency Medicine

## 2023-04-27 ENCOUNTER — Other Ambulatory Visit: Payer: Self-pay

## 2023-04-27 ENCOUNTER — Encounter (HOSPITAL_COMMUNITY): Payer: Self-pay

## 2023-04-27 DIAGNOSIS — I2510 Atherosclerotic heart disease of native coronary artery without angina pectoris: Secondary | ICD-10-CM | POA: Insufficient documentation

## 2023-04-27 DIAGNOSIS — R0602 Shortness of breath: Secondary | ICD-10-CM | POA: Insufficient documentation

## 2023-04-27 DIAGNOSIS — Z1152 Encounter for screening for COVID-19: Secondary | ICD-10-CM | POA: Diagnosis not present

## 2023-04-27 DIAGNOSIS — E669 Obesity, unspecified: Secondary | ICD-10-CM | POA: Diagnosis not present

## 2023-04-27 DIAGNOSIS — K219 Gastro-esophageal reflux disease without esophagitis: Secondary | ICD-10-CM | POA: Diagnosis not present

## 2023-04-27 DIAGNOSIS — I493 Ventricular premature depolarization: Secondary | ICD-10-CM | POA: Insufficient documentation

## 2023-04-27 DIAGNOSIS — Z86711 Personal history of pulmonary embolism: Secondary | ICD-10-CM | POA: Diagnosis not present

## 2023-04-27 DIAGNOSIS — R079 Chest pain, unspecified: Secondary | ICD-10-CM | POA: Insufficient documentation

## 2023-04-27 DIAGNOSIS — I700 Atherosclerosis of aorta: Secondary | ICD-10-CM | POA: Insufficient documentation

## 2023-04-27 DIAGNOSIS — Z7901 Long term (current) use of anticoagulants: Secondary | ICD-10-CM | POA: Diagnosis not present

## 2023-04-27 DIAGNOSIS — R002 Palpitations: Secondary | ICD-10-CM | POA: Insufficient documentation

## 2023-04-27 DIAGNOSIS — I509 Heart failure, unspecified: Secondary | ICD-10-CM | POA: Insufficient documentation

## 2023-04-27 DIAGNOSIS — R0789 Other chest pain: Secondary | ICD-10-CM | POA: Diagnosis not present

## 2023-04-27 DIAGNOSIS — Z6841 Body Mass Index (BMI) 40.0 and over, adult: Secondary | ICD-10-CM | POA: Insufficient documentation

## 2023-04-27 DIAGNOSIS — Z86718 Personal history of other venous thrombosis and embolism: Secondary | ICD-10-CM | POA: Insufficient documentation

## 2023-04-27 LAB — BASIC METABOLIC PANEL
Anion gap: 7 (ref 5–15)
BUN: 13 mg/dL (ref 8–23)
CO2: 22 mmol/L (ref 22–32)
Calcium: 8.9 mg/dL (ref 8.9–10.3)
Chloride: 108 mmol/L (ref 98–111)
Creatinine, Ser: 1.04 mg/dL (ref 0.61–1.24)
GFR, Estimated: >60 mL/min (ref >60)
Glucose, Bld: 103 mg/dL — ABNORMAL HIGH (ref 70–99)
Potassium: 3.9 mmol/L (ref 3.5–5.1)
Sodium: 137 mmol/L (ref 135–145)

## 2023-04-27 LAB — CBC
HCT: 45.9 % (ref 39.0–52.0)
Hemoglobin: 14.8 g/dL (ref 13.0–17.0)
MCH: 30.1 pg (ref 26.0–34.0)
MCHC: 32.2 g/dL (ref 30.0–36.0)
MCV: 93.5 fL (ref 80.0–100.0)
Platelets: 156 10*3/uL (ref 150–400)
RBC: 4.91 MIL/uL (ref 4.22–5.81)
RDW: 13.5 % (ref 11.5–15.5)
WBC: 3.6 10*3/uL — ABNORMAL LOW (ref 4.0–10.5)
nRBC: 0 % (ref 0.0–0.2)

## 2023-04-27 LAB — TROPONIN I (HIGH SENSITIVITY)
Troponin I (High Sensitivity): 16 ng/L (ref <18)
Troponin I (High Sensitivity): 21 ng/L — ABNORMAL HIGH (ref <18)
Troponin I (High Sensitivity): 19 ng/L — ABNORMAL HIGH (ref <18)
Troponin I (High Sensitivity): 17 ng/L (ref <18)

## 2023-04-27 LAB — RESP PANEL BY RT-PCR (RSV, FLU A&B, COVID)  RVPGX2
Influenza A by PCR: NEGATIVE
Influenza B by PCR: NEGATIVE
Resp Syncytial Virus by PCR: NEGATIVE
SARS Coronavirus 2 by RT PCR: NEGATIVE

## 2023-04-27 LAB — D-DIMER, QUANTITATIVE: D-Dimer, Quant: <0.27 ug{FEU}/mL (ref 0.00–0.50)

## 2023-04-27 LAB — BRAIN NATRIURETIC PEPTIDE: B Natriuretic Peptide: 136.5 pg/mL — ABNORMAL HIGH (ref 0.0–100.0)

## 2023-04-27 MED ORDER — ALUM & MAG HYDROXIDE-SIMETH 200-200-20 MG/5ML PO SUSP
30.0000 mL | Freq: Once | ORAL | Status: AC
  Administered 2023-04-27: 30 mL via ORAL
  Filled 2023-04-27: qty 30

## 2023-04-27 MED ORDER — FAMOTIDINE IN NACL 20-0.9 MG/50ML-% IV SOLN
20.0000 mg | Freq: Once | INTRAVENOUS | Status: AC
  Administered 2023-04-27: 20 mg via INTRAVENOUS
  Filled 2023-04-27: qty 50

## 2023-04-27 MED ORDER — FAMOTIDINE 40 MG PO TABS: 40.0000 mg | ORAL_TABLET | Freq: Every day | ORAL | 0 refills | Status: AC

## 2023-04-27 MED ORDER — PANTOPRAZOLE SODIUM 20 MG PO TBEC
20.0000 mg | DELAYED_RELEASE_TABLET | Freq: Every day | ORAL | 0 refills | Status: DC
Start: 2023-04-27 — End: 2024-05-30

## 2023-04-27 MED ORDER — LIDOCAINE VISCOUS HCL 2 % MT SOLN
15.0000 mL | Freq: Once | OROMUCOSAL | Status: AC
  Administered 2023-04-27: 15 mL via ORAL
  Filled 2023-04-27: qty 15

## 2023-04-27 NOTE — Discharge Instructions (Addendum)
Your cardiac workup was overall reassuring.  Continue to take your home Xarelto.  Your symptoms are consistent with likely bad gastric reflux for which we will start you on Pepcid and Protonix and have you follow-up with your primary care provider.  You can also try over-the-counter Maalox, return for any severe worsening symptoms.

## 2023-04-27 NOTE — ED Notes (Signed)
Pt prefers to sit on edge of bed d/t back cramping.

## 2023-04-27 NOTE — ED Provider Triage Note (Signed)
Emergency Medicine Provider Triage Evaluation Note  Richard Sandoval , a 77 y.o. male  was evaluated in triage.  Pt complains of chest pain, palpitations, shortness of breath.  Patient states that he has been having left-sided chest pain described as burning sensation as well as feelings of palpitations at night when he is lying down.  Reports associated shortness of breath when lying flat.  States symptoms seem to resolve when he gets up in the morning after a few hours.  Currently without symptoms..  Review of Systems  Positive: See above Negative:   Physical Exam  BP (!) 143/87   Pulse 78   Temp 98.1 F (36.7 C) (Oral)   Resp 17   Ht 5' 10.5" (1.791 m)   Wt (!) 171.5 kg   SpO2 99%   BMI 53.48 kg/m  Gen:   Awake, no distress   Resp:  Normal effort  MSK:   Moves extremities without difficulty  Other:  Lower extremity edema  Medical Decision Making  Medically screening exam initiated at 1:52 PM.  Appropriate orders placed.  Richard Sandoval was informed that the remainder of the evaluation will be completed by another provider, this initial triage assessment does not replace that evaluation, and the importance of remaining in the ED until their evaluation is complete.     Peter Garter, [place] 04/27/23 1353

## 2023-04-27 NOTE — ED Triage Notes (Signed)
Pt c/o left sided chest pain and SOBx3d. Pt is eupneic.

## 2023-04-27 NOTE — ED Provider Notes (Signed)
[place] EMERGENCY DEPARTMENT AT [facility] Provider Note   CSN: [id number] Arrival date & time: 04/27/23  1332     History {Add pertinent medical, surgical, social history, OB history to HPI:1} Chief Complaint  Patient presents with   Chest Pain    Abhimanyu Cruces is a 77 y.o. male.   Chest Pain      Home Medications Prior to Admission medications   Medication Sig Start Date End Date Taking? Authorizing Provider  buPROPion (WELLBUTRIN XL) 150 MG 24 hr tablet Take 1 tablet by mouth daily. 12/11/21   [provider]  furosemide (LASIX) 20 MG tablet Take 1 tablet (20 mg total) by mouth daily. 10/11/21 04/09/22  Alver Sorrow, NP  NON FORMULARY CPAP MACHINE QHS    [provider]  polyethylene glycol (MIRALAX / GLYCOLAX) packet Take 17 g by mouth daily as needed for mild constipation.    [provider]  rosuvastatin (CRESTOR) 20 MG tablet Take 1 tablet (20 mg total) by mouth daily. 05/21/22 05/16/23  Chilton Si, MD  spironolactone (ALDACTONE) 25 MG tablet Take 1 tablet (25 mg total) by mouth daily. 12/16/21 12/11/22  Alver Sorrow, NP  XARELTO 15 MG TABS tablet Take 15 mg by mouth daily with supper. with food 02/20/19   [provider]      Allergies    Ace inhibitors and Penicillins    Review of Systems   Review of Systems  Cardiovascular:  Positive for chest pain.    Physical Exam Updated Vital Signs BP (!) 143/87   Pulse 78   Temp 98.1 F (36.7 C) (Oral)   Resp 17   Ht 5' 10.5" (1.791 m)   Wt (!) 171.5 kg   SpO2 99%   BMI 53.48 kg/m  Physical Exam  ED Results / Procedures / Treatments   Labs (all labs ordered are listed, but only abnormal results are displayed) Labs Reviewed  BASIC METABOLIC PANEL - Abnormal; Notable for the following components:      Result Value   Glucose, Bld 103 (*)    All other components within normal limits  CBC - Abnormal; Notable for the following components:   WBC 3.6  (*)    All other components within normal limits  BRAIN NATRIURETIC PEPTIDE - Abnormal; Notable for the following components:   B Natriuretic Peptide 136.5 (*)    All other components within normal limits  TROPONIN I (HIGH SENSITIVITY)  TROPONIN I (HIGH SENSITIVITY)    EKG None  Radiology No results found.  Procedures Procedures  {Document cardiac monitor, telemetry assessment procedure when appropriate:1}  Medications Ordered in ED Medications - No data to display  ED Course/ Medical Decision Making/ A&P   {   Click here for ABCD2, HEART and other calculatorsREFRESH Note before signing :1}                              Medical Decision Making Amount and/or Complexity of Data Reviewed Labs: ordered. Radiology: ordered.   ***  {Document critical care time when appropriate:1} {Document review of labs and clinical decision tools ie heart score, Chads2Vasc2 etc:1}  {Document your independent review of radiology images, and any outside records:1} {Document your discussion with family members, caretakers, and with consultants:1} {Document social determinants of health affecting pt's care:1} {Document your decision making why or why not admission, treatments were needed:1} Final Clinical Impression(s) / ED Diagnoses Final diagnoses:  None  Rx / DC Orders ED Discharge Orders     None

## 2023-04-27 NOTE — ED Notes (Signed)
EM provider at bedside.

## 2023-05-11 DIAGNOSIS — K219 Gastro-esophageal reflux disease without esophagitis: Secondary | ICD-10-CM | POA: Diagnosis not present

## 2023-05-11 DIAGNOSIS — Z7901 Long term (current) use of anticoagulants: Secondary | ICD-10-CM | POA: Diagnosis not present

## 2023-05-11 DIAGNOSIS — R6881 Early satiety: Secondary | ICD-10-CM | POA: Diagnosis not present

## 2023-05-11 DIAGNOSIS — I5042 Chronic combined systolic (congestive) and diastolic (congestive) heart failure: Secondary | ICD-10-CM | POA: Diagnosis not present

## 2023-05-11 DIAGNOSIS — Z86711 Personal history of pulmonary embolism: Secondary | ICD-10-CM | POA: Diagnosis not present

## 2023-05-11 DIAGNOSIS — I2510 Atherosclerotic heart disease of native coronary artery without angina pectoris: Secondary | ICD-10-CM | POA: Diagnosis not present

## 2023-07-15 DIAGNOSIS — K5900 Constipation, unspecified: Secondary | ICD-10-CM | POA: Diagnosis not present

## 2023-07-15 DIAGNOSIS — K219 Gastro-esophageal reflux disease without esophagitis: Secondary | ICD-10-CM | POA: Diagnosis not present

## 2023-08-14 DIAGNOSIS — I5042 Chronic combined systolic (congestive) and diastolic (congestive) heart failure: Secondary | ICD-10-CM | POA: Diagnosis not present

## 2023-08-14 DIAGNOSIS — F331 Major depressive disorder, recurrent, moderate: Secondary | ICD-10-CM | POA: Diagnosis not present

## 2023-08-14 DIAGNOSIS — I272 Pulmonary hypertension, unspecified: Secondary | ICD-10-CM | POA: Diagnosis not present

## 2023-08-14 DIAGNOSIS — E785 Hyperlipidemia, unspecified: Secondary | ICD-10-CM | POA: Diagnosis not present

## 2023-08-14 DIAGNOSIS — R609 Edema, unspecified: Secondary | ICD-10-CM | POA: Diagnosis not present

## 2023-08-14 DIAGNOSIS — I872 Venous insufficiency (chronic) (peripheral): Secondary | ICD-10-CM | POA: Diagnosis not present

## 2023-08-14 DIAGNOSIS — R7303 Prediabetes: Secondary | ICD-10-CM | POA: Diagnosis not present

## 2023-08-14 DIAGNOSIS — M25511 Pain in right shoulder: Secondary | ICD-10-CM | POA: Diagnosis not present

## 2023-08-14 DIAGNOSIS — K219 Gastro-esophageal reflux disease without esophagitis: Secondary | ICD-10-CM | POA: Diagnosis not present

## 2023-08-14 DIAGNOSIS — K5900 Constipation, unspecified: Secondary | ICD-10-CM | POA: Diagnosis not present

## 2023-11-27 ENCOUNTER — Other Ambulatory Visit: Payer: Self-pay | Admitting: Family Medicine

## 2023-11-27 DIAGNOSIS — R634 Abnormal weight loss: Secondary | ICD-10-CM

## 2023-12-07 ENCOUNTER — Encounter: Payer: Self-pay | Admitting: Family Medicine

## 2023-12-11 ENCOUNTER — Ambulatory Visit
Admission: RE | Admit: 2023-12-11 | Discharge: 2023-12-11 | Disposition: A | Discharge status: Home / Self Care | Source: Ambulatory Visit | Attending: Family Medicine | Admitting: Family Medicine

## 2023-12-11 ENCOUNTER — Encounter: Payer: Self-pay | Admitting: Family Medicine

## 2023-12-11 DIAGNOSIS — R634 Abnormal weight loss: Secondary | ICD-10-CM

## 2023-12-14 ENCOUNTER — Ambulatory Visit
Admission: RE | Admit: 2023-12-14 | Discharge: 2023-12-14 | Disposition: A | Discharge status: Home / Self Care | Source: Ambulatory Visit | Attending: Family Medicine | Admitting: Family Medicine

## 2023-12-14 MED ORDER — IOPAMIDOL (ISOVUE-300) INJECTION 61%
125.0000 mL | Freq: Once | INTRAVENOUS | Status: AC | PRN
  Administered 2023-12-14: 125 mL via INTRAVENOUS

## 2024-02-17 ENCOUNTER — Encounter: Payer: Self-pay | Admitting: Neurology

## 2024-02-22 NOTE — Progress Notes (Unsigned)
 Assessment/Plan:   Probable parkinsons disease  -We discussed the diagnosis as well as pathophysiology of the disease.  We discussed treatment options as well as prognostic indicators.  Patient education was provided.  -We discussed that it used to be thought that levodopa  would increase risk of melanoma but now it is believed that Parkinsons itself likely increases risk of melanoma. he is to get regular skin checks.  -Greater than 50% of the 60 minute visit was spent in counseling answering questions and talking about what to expect now as well as in the future.  We talked about medication options as well as potential future surgical options.  We talked about safety in the home.  -We decided to add carbidopa /levodopa  25/100.  1/2 tab tid x 1 wk, then 1/2 in am & noon & 1 at night for a week, then 1/2 in am &1 at noon &night for a week, then 1 po tid.  Risks, benefits, side effects and alternative therapies were discussed.  The opportunity to ask questions was given and they were answered to the best of my ability.  The patient expressed understanding and willingness to follow the outlined treatment protocols.  -I strongly encouraged PT but at this point he declined referral  -We discussed community resources in the area including patient support groups and community exercise programs for PD and pt education was provided to the patient.   -discussed day/night schedule.  Would like to see him awake by 10am.  Gave him levodopa  schedule based on this  2.  Hx of back/neck surgery  -sees Dr. Colon  -has utilized hospital bed since 2020  3. Morbid Obesity  -limits ability to ambulate as does #2.    4. Constipation  -discussed nature and pathophysiology and association with PD  -discussed importance of hydration.  Pt is to increase water intake  -pt is given a copy of the rancho recipe  -recommended daily colace  -recommended miralax  prn   Subjective:   Richard Sandoval was seen today in the  movement disorders clinic for neurologic consultation at the request of [name], MD.  The consultation is for the evaluation of gait abnormality, tremor and to rule out Parkinson's disease.  Medical records made available to me are reviewed.  Recent thyroid  testing by primary care in January, 2025 was normal with a TSH of 2.05.  His liver function tests were normal Nov 26, 2023.   Specific Symptoms:  Tremor: started few years ago in the L hand; intermittent; at rest; never in the L leg or on the R side Family hx of similar:  No. Voice: no change Sleep: on cpap faithfully  Vivid Dreams:  No.  Acting out dreams:  some sleep talking; in hospital bed x 10 years; started using it after back and neck surgery and stayed in yet Wet Pillows: No. Postural symptoms:  Yes.  , uses walker/cane and the longer he walks the more trouble he has.  Uses cane in the house.  Has used a cane since back surgery in 2000  Falls?  No. Bradykinesia symptoms: difficulty getting out of a chair (little easier since lost 80 lbs over the last few years); some shuffle; no drool Loss of smell:  Yes.   Loss of taste:  Yes.   Urinary Incontinence:  No. Difficulty Swallowing:  No. But states he is careful as has poor dentition Handwriting, micrographia: just tremulous Trouble with ADL's:  No. But mostly wears athletic gear as easy to get  on/off; has bad right shoulder and does need help getting jackets on on the right  Trouble buttoning clothing: No. Depression:  Yes.   And GAD (daughter moved back in with her 3 children) Memory changes:  ok but not as good as used to be (some trouble with names) N/V:  No. Lightheaded:  No.  Syncope: No. Diplopia:  No. But its blurry   Neuroimaging of the brain has not previously been performed in the recent years.    ALLERGIES:   Allergies  Allergen Reactions   Ace Inhibitors     Other reaction(s): cough   Penicillins Itching and Other (See Comments)    Has patient had a PCN  reaction causing immediate rash, facial/tongue/throat swelling, SOB or lightheadedness with hypotension: No Has patient had a PCN reaction causing severe rash involving mucus membranes or skin necrosis: No Has patient had a PCN reaction that required hospitalization No Has patient had a PCN reaction occurring within the last 10 years: No If all of the above answers are NO, then may proceed with Cephalosporin use.    CURRENT MEDICATIONS:  Current Meds  Medication Sig   buPROPion (WELLBUTRIN XL) 150 MG 24 hr tablet Take 1 tablet by mouth daily.   famotidine  (PEPCID ) 40 MG tablet Take 1 tablet (40 mg total) by mouth daily.   furosemide  (LASIX ) 20 MG tablet Take 1 tablet (20 mg total) by mouth daily.   gabapentin (NEURONTIN) 100 MG capsule Take 100 mg by mouth at bedtime as needed.   NON FORMULARY CPAP MACHINE QHS   pantoprazole  (PROTONIX ) 20 MG tablet Take 1 tablet (20 mg total) by mouth daily.   polyethylene glycol (MIRALAX  / GLYCOLAX ) packet Take 17 g by mouth daily as needed for mild constipation.   Respiratory Therapy Supplies (CARETOUCH 2 CPAP HOSE HANGER) MISC at bedtime   rosuvastatin  (CRESTOR ) 20 MG tablet Take 1 tablet (20 mg total) by mouth daily.   spironolactone  (ALDACTONE ) 25 MG tablet Take 1 tablet (25 mg total) by mouth daily.   tamsulosin (FLOMAX) 0.4 MG CAPS capsule Take 0.8 mg by mouth daily.   XARELTO 15 MG TABS tablet Take 15 mg by mouth daily with supper. with food     Objective:   VITALS:   Vitals:   02/24/24 1013  BP: 139/72  Pulse: 68  SpO2: 98%  Weight: (!) 324 lb (147 kg)  Height: 5' 10.5 (1.791 m)    GEN:  The patient appears stated age and is in NAD. HEENT:  Normocephalic, atraumatic.  The mucous membranes are moist. The superficial temporal arteries are without ropiness or tenderness. CV:  RRR Lungs:  CTAB Neck/HEME:  There are no carotid bruits bilaterally.  Neurological examination:  Orientation: The patient is alert and oriented x3.   Cranial nerves: There is good facial symmetry. There is facial hypomimia.  Extraocular muscles are intact. The visual fields are full to confrontational testing. The speech is fluent and clear but hypophonic. Soft palate rises symmetrically and there is no tongue deviation. Hearing is intact to conversational tone. Sensation: Sensation is intact to light and pinprick throughout (facial, trunk, extremities). Vibration is intact at the bilateral ankle. There is no extinction with double simultaneous stimulation. There is no sensory dermatomal level identified. Motor: Strength is 5/5 in the bilateral upper and lower extremities.   Shoulder shrug is equal and symmetric.  There is no pronator drift. Deep tendon reflexes: Deep tendon reflexes are 0-1/4 at the bilateral biceps, triceps, brachioradialis, patella and achilles. Plantar  responses are downgoing bilaterally.  Movement examination: Tone: There is mild increased tone in the LUE Abnormal movements: there is no tremor even with distraction procedures Coordination:  There is mild decremation with RAM's, with finger taps bilaterally, heel taps on the L Gait and Station: The patient pushes off to arise.  He is given his walker.  He ambulates well with a walker.  With the cane, he is just a bit short stepped and wide-based. I have reviewed and interpreted the following labs independently   Chemistry      Component Value Date/Time   NA 137 04/27/2023 1346   NA 141 12/25/2021 1131   K 3.9 04/27/2023 1346   CL 108 04/27/2023 1346   CO2 22 04/27/2023 1346   BUN 13 04/27/2023 1346   BUN 18 12/25/2021 1131   CREATININE 1.04 04/27/2023 1346      Component Value Date/Time   CALCIUM  8.9 04/27/2023 1346   ALKPHOS 61 10/24/2021 1312   AST 20 10/24/2021 1312   ALT 12 10/24/2021 1312   BILITOT 1.1 10/24/2021 1312      No results found for: TSH Lab Results  Component Value Date   WBC 3.6 (L) 04/27/2023   HGB 14.8 04/27/2023   HCT 45.9  04/27/2023   MCV 93.5 04/27/2023   PLT 156 04/27/2023     Total time spent on today's visit was 70 minutes, including both face-to-face time and nonface-to-face time.  Time included that spent on review of records (prior notes available to me/labs/imaging if pertinent), discussing treatment and goals, answering patient's questions and coordinating care.  Cc:  [name], MD

## 2024-02-24 ENCOUNTER — Ambulatory Visit (INDEPENDENT_AMBULATORY_CARE_PROVIDER_SITE_OTHER): Admitting: Neurology

## 2024-02-24 ENCOUNTER — Encounter: Payer: Self-pay | Admitting: Neurology

## 2024-02-24 VITALS — BP 139/72 | HR 68 | Ht 70.5 in | Wt 324.0 lb

## 2024-02-24 DIAGNOSIS — K5901 Slow transit constipation: Secondary | ICD-10-CM | POA: Diagnosis not present

## 2024-02-24 DIAGNOSIS — G20A1 Parkinson's disease without dyskinesia, without mention of fluctuations: Secondary | ICD-10-CM | POA: Diagnosis not present

## 2024-02-24 MED ORDER — CARBIDOPA-LEVODOPA 25-100 MG PO TABS: 1.0000 | ORAL_TABLET | Freq: Three times a day (TID) | ORAL | 1 refills | Status: AC

## 2024-02-24 NOTE — Patient Instructions (Addendum)
 [employer]!  Start Carbidopa  Levodopa  as follows: Take 1/2 tablet three times daily, at least 30 minutes before meals (approximately 10am/2pm/6pm), for one week Then take 1/2 tablet in the morning, 1/2 tablet in the afternoon at 2pm, 1 tablet in the evening at 6pm, at least 30 minutes before meals, for one week Then take 1/2 tablet in the morning, 1 tablet in the afternoon at 2pm, 1 tablet in the evening at 6pm, at least 30 minutes before meals, for one week Then take 1 tablet three times daily at 10am/2pm/6pm, at least 30 minutes before meals   As a reminder, carbidopa /levodopa  can be taken at the same time as a carbohydrate, but we like to have you take your pill either 30 minutes before a protein source or 1 hour after as protein can interfere with carbidopa /levodopa  absorption.   Constipation and Parkinson's disease:  1.Rancho recipe for constipation in Parkinsons Disease:  -1 cup of unprocessed bran (need to get this at [employer], [employer] or similar type of store), 2 cups of applesauce in 1 cup of prune juice 2.  Increase fiber intake (Metamucil,vegetables) 3.  Regular, moderate exercise can be beneficial. 4.  Avoid medications causing constipation, such as medications like antacids with calcium  or magnesium 5.  It's okay to take daily Miralax , and taper if stools become too loose or you experience diarrhea 6.  Stool softeners (Colace) can help with chronic constipation and I recommend you take this daily. 7.  Increase water intake.  You should be drinking 1/2 gallon of water a day as long as you have not been diagnosed with congestive heart failure or renal/kidney failure.  This is probably the single greatest thing that you can do to help your constipation.

## 2024-03-10 ENCOUNTER — Telehealth: Payer: Self-pay | Admitting: Neurology

## 2024-03-10 NOTE — Telephone Encounter (Signed)
 Left a message with the after hour service on 03-10-24   Caller states that he has about mediation Carbidopa levodopa and would like to speak to someone about the support groups and PT

## 2024-03-11 NOTE — Telephone Encounter (Signed)
 Left patient a detailed message regarding the support group at [facility] and PT  groups. I alos said I would call him back to discuss further on Monday

## 2024-05-27 ENCOUNTER — Emergency Department (HOSPITAL_COMMUNITY)

## 2024-05-27 ENCOUNTER — Other Ambulatory Visit: Payer: Self-pay

## 2024-05-27 ENCOUNTER — Emergency Department (HOSPITAL_COMMUNITY)
Admission: EM | Admit: 2024-05-27 | Discharge: 2024-05-27 | Disposition: A | Discharge status: Home / Self Care | Attending: Emergency Medicine | Admitting: Emergency Medicine

## 2024-05-27 DIAGNOSIS — R600 Localized edema: Secondary | ICD-10-CM | POA: Diagnosis not present

## 2024-05-27 DIAGNOSIS — I509 Heart failure, unspecified: Secondary | ICD-10-CM | POA: Insufficient documentation

## 2024-05-27 DIAGNOSIS — G20C Parkinsonism, unspecified: Secondary | ICD-10-CM | POA: Diagnosis not present

## 2024-05-27 DIAGNOSIS — W1839XA Other fall on same level, initial encounter: Secondary | ICD-10-CM | POA: Insufficient documentation

## 2024-05-27 DIAGNOSIS — I110 Hypertensive heart disease with heart failure: Secondary | ICD-10-CM | POA: Insufficient documentation

## 2024-05-27 DIAGNOSIS — S62322A Displaced fracture of shaft of third metacarpal bone, right hand, initial encounter for closed fracture: Secondary | ICD-10-CM | POA: Insufficient documentation

## 2024-05-27 DIAGNOSIS — Z7901 Long term (current) use of anticoagulants: Secondary | ICD-10-CM | POA: Diagnosis not present

## 2024-05-27 DIAGNOSIS — S6991XA Unspecified injury of right wrist, hand and finger(s), initial encounter: Secondary | ICD-10-CM | POA: Diagnosis present

## 2024-05-27 NOTE — Discharge Instructions (Signed)
 The x-ray showed a fracture of the third metacarpal.  There may also be a tiny chip fracture in the wrist (the ulnar styloid process).  Keep the splint on.  Call Dr. Reina office to schedule an appointment

## 2024-05-27 NOTE — ED Provider Notes (Signed)
 [place] EMERGENCY DEPARTMENT AT [facility] Provider Note   CSN: [id number] Arrival date & time: 05/27/24  1836     Patient presents with: Fall and Wrist Pain   Richard Sandoval is a 78 y.o. male.    Fall  Wrist Pain     Patient has a history of DVT hypertension pancreatitis obesity pulmonary hypertension heart failure, Parkinson's disease.  Patient states he fell about a week ago.  He landed primarily on his right side.  Patient states since that time he has noted some persistent swelling primarily in his right wrist and right hand.  He has also had a little bit of soreness in his right rib area.  Patient states he felt like he was able to manage it at home.  However when he continued to have persistent swelling in his wrist and hand he decided come in today for evaluation.  He is not having difficulty breathing.  No abdominal pain.  No shortness of breath.  No headache.  Prior to Admission medications   Medication Sig Start Date End Date Taking? Authorizing Provider  buPROPion (WELLBUTRIN XL) 150 MG 24 hr tablet Take 1 tablet by mouth daily. 12/11/21   [provider]  carbidopa -levodopa  (SINEMET  IR) 25-100 MG tablet Take 1 tablet by mouth 3 (three) times daily. 10am/2pm/6pm 02/24/24   Tat, Asberry RAMAN, DO  famotidine  (PEPCID ) 40 MG tablet Take 1 tablet (40 mg total) by mouth daily. 04/27/23 02/24/24  Jerrol Agent, MD  furosemide  (LASIX ) 20 MG tablet Take 1 tablet (20 mg total) by mouth daily. 10/11/21 02/24/24  Walker, Caitlin S, NP  gabapentin (NEURONTIN) 100 MG capsule Take 100 mg by mouth at bedtime as needed.    [provider]  NON FORMULARY CPAP MACHINE QHS    [provider]  pantoprazole  (PROTONIX ) 20 MG tablet Take 1 tablet (20 mg total) by mouth daily. 04/27/23 02/24/24  Jerrol Agent, MD  polyethylene glycol (MIRALAX  / GLYCOLAX ) packet Take 17 g by mouth daily as needed for mild constipation.    [provider]  Respiratory  Therapy Supplies (CARETOUCH 2 CPAP HOSE HANGER) MISC at bedtime    [provider]  rosuvastatin  (CRESTOR ) 20 MG tablet Take 1 tablet (20 mg total) by mouth daily. 05/21/22 02/24/24  Raford Riggs, MD  spironolactone  (ALDACTONE ) 25 MG tablet Take 1 tablet (25 mg total) by mouth daily. 12/16/21 02/24/24  Walker, Caitlin S, NP  tamsulosin (FLOMAX) 0.4 MG CAPS capsule Take 0.8 mg by mouth daily.    [provider]  XARELTO 15 MG TABS tablet Take 15 mg by mouth daily with supper. with food 02/20/19   [provider]    Allergies: Ace inhibitors and Penicillins    Review of Systems  Updated Vital Signs BP (!) 146/92 (BP Location: Left Arm)   Pulse 77   Temp 97.9 F (36.6 C) (Oral)   Resp 18   SpO2 99%   Physical Exam Vitals and nursing note reviewed.  Constitutional:      General: He is not in acute distress.    Appearance: He is well-developed.  HENT:     Head: Normocephalic and atraumatic.     Right Ear: External ear normal.     Left Ear: External ear normal.  Eyes:     General: No scleral icterus.       Right eye: No discharge.        Left eye: No discharge.     Conjunctiva/sclera: Conjunctivae normal.  Neck:     Trachea: No tracheal deviation.  Cardiovascular:     Rate and Rhythm: Normal rate and regular rhythm.  Pulmonary:     Effort: Pulmonary effort is normal. No respiratory distress.     Breath sounds: Normal breath sounds. No stridor. No wheezing or rales.  Chest:     Comments: Mild tenderness palpation right chest wall, no crepitus Abdominal:     General: Bowel sounds are normal. There is no distension.     Palpations: Abdomen is soft.     Tenderness: There is no abdominal tenderness. There is no guarding or rebound.  Musculoskeletal:        General: Tenderness present. No deformity.     Cervical back: Neck supple.     Right lower leg: Edema present.     Left lower leg: Edema present.     Comments: Tenderness palpation right wrist and  hand, swelling noted, no tenderness in the shoulder or elbow; small abrasion noted right knee but no tenderness full range of motion  Skin:    General: Skin is warm and dry.     Findings: No rash.  Neurological:     General: No focal deficit present.     Mental Status: He is alert.     Cranial Nerves: No cranial nerve deficit, dysarthria or facial asymmetry.     Sensory: No sensory deficit.     Motor: No abnormal muscle tone or seizure activity.     Coordination: Coordination normal.  Psychiatric:        Mood and Affect: Mood normal.     (all labs ordered are listed, but only abnormal results are displayed) Labs Reviewed - No data to display  EKG: None  Radiology: DG Hand Complete Right Result Date: 05/27/2024 CLINICAL DATA:  Fall with pain EXAM: RIGHT HAND - COMPLETE 3+ VIEW COMPARISON:  05/27/2024 FINDINGS: Acute oblique fracture involving the shaft of the third metacarpal with mild dorsal displacement. No subluxation. Possible tiny ulnar styloid process fracture IMPRESSION: Acute mildly displaced third metacarpal shaft fracture. Possible tiny ulnar styloid process fracture. Electronically Signed   By: Luke Bun M.D.   On: 05/27/2024 20:10   DG Wrist Complete Right Result Date: 05/27/2024 CLINICAL DATA:  Fall wrist pain EXAM: RIGHT WRIST - COMPLETE 3+ VIEW COMPARISON:  None Available. FINDINGS: Possible tiny fracture at the tip of ulnar styloid process. No malalignment. IMPRESSION: Possible tiny fracture at the tip of ulnar styloid process. See separately dictated hand radiographs Electronically Signed   By: Luke Bun M.D.   On: 05/27/2024 20:09   DG Ribs Unilateral W/Chest Right Result Date: 05/27/2024 CLINICAL DATA:  Fall, pain EXAM: RIGHT RIBS AND CHEST - 3+ VIEW COMPARISON:  None Available. FINDINGS: Single view chest demonstrates cardiomegaly. No acute airspace disease, pleural effusion or pneumothorax. Aortic atherosclerosis. Right rib series demonstrates no acute  displaced right rib fracture. IMPRESSION: No active disease. Cardiomegaly. No acute displaced right rib fracture. Electronically Signed   By: Luke Bun M.D.   On: 05/27/2024 20:08     Procedures   Medications Ordered in the ED - No data to display  Clinical Course as of 05/27/24 2113  Fri May 27, 2024  2019 X-ray shows a third metacarpal shaft fracture [JK]  2019 Possible tiny fracture of the ulnar styloid [JK]  2019 Chest x-ray without acute findings [JK]    Clinical Course User Index [JK] Randol Simmonds, MD  Medical Decision Making Amount and/or Complexity of Data Reviewed Radiology: ordered.  Patient presented with primarily persistent hand swelling after fall couple weeks ago.  Patient did not hit his head has not been having any headaches.  He was complaining of some rib pain but no difficulty breathing.  X-rays did not show any signs of rib fracture.  Hand and wrist x-rays do show possibly a small ulnar styloid process fracture.  He also has a third metacarpal fracture.  Will place patient in a splint and have him follow-up with orthopedics as an outpatient      Final diagnoses:  Closed displaced fracture of shaft of third metacarpal bone of right hand, initial encounter    ED Discharge Orders     None          Randol Simmonds, MD 05/27/24 2114

## 2024-05-27 NOTE — Progress Notes (Signed)
 Orthopedic Tech Progress Note Patient Details:  Richard Sandoval 05/27/1946 [id number]  Ortho Devices Type of Ortho Device: Volar splint Ortho Device/Splint Location: R WRIST Ortho Device/Splint Interventions: Ordered, Application, Adjustment   Post Interventions Patient Tolerated: Well Instructions Provided: Care of device  Yochanan Eddleman L [name] 05/27/2024, 11:32 PM

## 2024-05-27 NOTE — ED Triage Notes (Signed)
 PT arrives to triage via wheelchair. PT reports that he fell down 1 week ago and injured his RIGHT hand and wrist. PT states that most of his RIGHT side is sore including his knee, elbow, and ribs. PT denies striking head.

## 2024-05-30 ENCOUNTER — Ambulatory Visit (INDEPENDENT_AMBULATORY_CARE_PROVIDER_SITE_OTHER): Admitting: Cardiovascular Disease

## 2024-05-30 ENCOUNTER — Encounter (HOSPITAL_BASED_OUTPATIENT_CLINIC_OR_DEPARTMENT_OTHER): Payer: Self-pay | Admitting: Cardiovascular Disease

## 2024-05-30 VITALS — BP 118/78 | HR 80 | Ht 70.0 in | Wt 328.0 lb

## 2024-05-30 DIAGNOSIS — I272 Pulmonary hypertension, unspecified: Secondary | ICD-10-CM

## 2024-05-30 DIAGNOSIS — I2699 Other pulmonary embolism without acute cor pulmonale: Secondary | ICD-10-CM

## 2024-05-30 DIAGNOSIS — I2510 Atherosclerotic heart disease of native coronary artery without angina pectoris: Secondary | ICD-10-CM | POA: Diagnosis not present

## 2024-05-30 DIAGNOSIS — G4733 Obstructive sleep apnea (adult) (pediatric): Secondary | ICD-10-CM

## 2024-05-30 DIAGNOSIS — I5042 Chronic combined systolic (congestive) and diastolic (congestive) heart failure: Secondary | ICD-10-CM

## 2024-05-30 NOTE — Patient Instructions (Signed)
 Medication Instructions:  Your physician recommends that you continue on your current medications as directed. Please refer to the Current Medication list given to you today.   *If you need a refill on your cardiac medications before your next appointment, please call your pharmacy*  Lab Work: NONE  Testing/Procedures: Your physician has requested that you have an echocardiogram. Echocardiography is a painless test that uses sound waves to create images of your heart. It provides your doctor with information about the size and shape of your heart and how well your heart's chambers and valves are working. This procedure takes approximately one hour. There are no restrictions for this procedure. Please do NOT wear cologne, perfume, aftershave, or lotions (deodorant is allowed). Please arrive 15 minutes prior to your appointment time.  Please note: We ask at that you not bring children with you during ultrasound (echo/ vascular) testing. Due to room size and safety concerns, children are not allowed in the ultrasound rooms during exams. Our front office staff cannot provide observation of children in our lobby area while testing is being conducted. An adult accompanying a patient to their appointment will only be allowed in the ultrasound room at the discretion of the ultrasound technician under special circumstances. We apologize for any inconvenience.  Follow-Up: At [facility], you and your health needs are our priority.  As part of our continuing mission to provide you with exceptional heart care, our providers are all part of one team.  This team includes your primary Cardiologist (physician) and Advanced Practice Providers or APPs (Physician Assistants and Nurse Practitioners) who all work together to provide you with the care you need, when you need it.  Your next appointment:   12 month(s)  Provider:   Maudine Sos, MD, Slater Duncan, NP, or Neomi Banks, NP     We  recommend signing up for the patient portal called "MyChart".  Sign up information is provided on this After Visit Summary.  MyChart is used to connect with patients for Virtual Visits (Telemedicine).  Patients are able to view lab/test results, encounter notes, upcoming appointments, etc.  Non-urgent messages can be sent to your provider as well.   To learn more about what you can do with MyChart, go to [email or web address].

## 2024-05-30 NOTE — Progress Notes (Unsigned)
 Assessment/Plan:   1.  Probable parkinsons disease, diagnosed July, 2025             -We discussed the diagnosis as well as pathophysiology of the disease.  We discussed treatment options as well as prognostic indicators.  Patient education was provided.             -Restart carbidopa /levodopa  25/100 tid.  Titration schedule given.  The importance of taking the medication regularly was discussed.  He is having a hard time waking up in the morning, even by 10 AM, to take the medication, but I discussed that he really needed to do this.             -I strongly encouraged PT and he was agreeable.  I sent a referral to [facility].  He is trying to get involved with the support group as well.  Information given to him about this.    2.  Hx of back/neck surgery             -sees Dr. Colon             -has utilized hospital bed since 2020   3. Morbid Obesity             -limits ability to ambulate as does #2.     4. Constipation             - Has information about MiraLAX , rancho recipe, increasing hydration.   Subjective:   Richard Sandoval was seen today in follow up for Parkinsons disease.  My previous records were reviewed prior to todays visit as well as outside records available to me.  Patient was diagnosed last visit and started on levodopa .  He reports he is not taking it as he had both diarrhea and constipation and he wasn't sure if it was from the medication and he had trouble getting OOB in the AM. We encouraged him last visit to attend physical therapy for balance, but unfortunately he had declined the referral.  Pt just went to the emergency room October 31 after a fall a week prior.  Patient noted increasing wrist pain.  He had x-rays which showed possibly a small ulnar and styloid process fracture and a third metacarpal fracture.  Pt denies lightheadedness, near syncope.  No hallucinations.  Mood has been good.  He was just seen by Dr. Annabella Scarce for shortness of breath and  dyspnea on exertion.  Current prescribed movement disorder medications: Carbidopa /levodopa  25/100, 1 tablet 3 times per day (started last visit)    ALLERGIES:   Allergies  Allergen Reactions   Lisinopril     Other Reaction(s): Not available   Ace Inhibitors     Other reaction(s): cough   Penicillins Itching and Other (See Comments)    Has patient had a PCN reaction causing immediate rash, facial/tongue/throat swelling, SOB or lightheadedness with hypotension: No Has patient had a PCN reaction causing severe rash involving mucus membranes or skin necrosis: No Has patient had a PCN reaction that required hospitalization No Has patient had a PCN reaction occurring within the last 10 years: No If all of the above answers are NO, then may proceed with Cephalosporin use.    CURRENT MEDICATIONS:  Current Meds  Medication Sig   bismuth subsalicylate (PEPTO BISMOL) 262 MG/15ML suspension Take 30 mLs by mouth every 6 (six) hours as needed for diarrhea or loose stools.   buPROPion (WELLBUTRIN XL) 150 MG 24 hr tablet Take 1 tablet by  mouth daily.   carbidopa -levodopa  (SINEMET  IR) 25-100 MG tablet Take 1 tablet by mouth 3 (three) times daily. 10am/2pm/6pm   famotidine  (PEPCID ) 40 MG tablet Take 1 tablet (40 mg total) by mouth daily.   furosemide  (LASIX ) 20 MG tablet Take 1 tablet (20 mg total) by mouth daily.   gabapentin (NEURONTIN) 100 MG capsule Take 100 mg by mouth at bedtime as needed.   NON FORMULARY CPAP MACHINE QHS   polyethylene glycol (MIRALAX  / GLYCOLAX ) packet Take 17 g by mouth daily as needed for mild constipation.   Respiratory Therapy Supplies (CARETOUCH 2 CPAP HOSE HANGER) MISC at bedtime   rosuvastatin  (CRESTOR ) 20 MG tablet Take 1 tablet (20 mg total) by mouth daily.   spironolactone  (ALDACTONE ) 25 MG tablet Take 1 tablet (25 mg total) by mouth daily.   tamsulosin (FLOMAX) 0.4 MG CAPS capsule Take 0.8 mg by mouth daily.   XARELTO 15 MG TABS tablet Take 15 mg by mouth  daily with supper. with food     Objective:   PHYSICAL EXAMINATION:    VITALS:   Vitals:   06/02/24 1339  BP: 136/88  Pulse: 86  SpO2: 98%  Weight: 296 lb (134.3 kg)    GEN:  The patient appears stated age and is in NAD. HEENT:  Normocephalic, atraumatic.  The mucous membranes are moist. The superficial temporal arteries are without ropiness or tenderness. CV:  RRR Lungs:  CTAB Neck/HEME:  There are no carotid bruits bilaterally.  Neurological examination:  Orientation: The patient is alert and oriented x3. Cranial nerves: There is good facial symmetry with minimal facial hypomimia. The speech is fluent and clear. Soft palate rises symmetrically and there is no tongue deviation. Hearing is intact to conversational tone. Sensation: Sensation is intact to light touch throughout Motor: Strength is at least antigravity x4.  Movement examination: Tone: There is mild to moderate increased tone in the LUE Abnormal movements: there is no tremor even with distraction procedures Coordination:  There is mild decremation with RAM's, with finger taps bilaterally, heel taps on the L Gait and Station: Not tested today  I have reviewed and interpreted the following labs independently    Chemistry      Component Value Date/Time   NA 137 04/27/2023 1346   NA 141 12/25/2021 1131   K 3.9 04/27/2023 1346   CL 108 04/27/2023 1346   CO2 22 04/27/2023 1346   BUN 13 04/27/2023 1346   BUN 18 12/25/2021 1131   CREATININE 1.04 04/27/2023 1346      Component Value Date/Time   CALCIUM  8.9 04/27/2023 1346   ALKPHOS 61 10/24/2021 1312   AST 20 10/24/2021 1312   ALT 12 10/24/2021 1312   BILITOT 1.1 10/24/2021 1312       Lab Results  Component Value Date   WBC 3.6 (L) 04/27/2023   HGB 14.8 04/27/2023   HCT 45.9 04/27/2023   MCV 93.5 04/27/2023   PLT 156 04/27/2023    No results found for: TSH   Total time spent on today's visit was 40 minutes, including both face-to-face time  and nonface-to-face time.  Time included that spent on review of records (prior notes available to me/labs/imaging if pertinent), discussing treatment and goals, answering patient's questions and coordinating care.  Cc:  [name], MD

## 2024-05-30 NOTE — Progress Notes (Signed)
 Cardiology Office Note:  .   Date:  05/30/2024  ID:  [name], DOB 03-14-1946, MRN [id number] PCP: [name], MD  [facility] Providers Cardiologist:  Annabella Scarce, MD    History of Present Illness: .    Richard Sandoval is a 78 y.o. male with HFmrEF, non-obstructive CAD, mild ascending aorta aneurysm,  prior pulmonary embolism, hypertension, morbid obesity, OSA on CPAP and Parkinson's diease here for follow up.  He was initially seen 05/2017 for the evaluation of chronic dyspnea on exertion.  Richard Sandoval reports many years of shortness of breath.  This dates back to 2000 after he had surgery on his back.  Since that time he continues to have pain and has not been able to be very physically active.  He has noted progressive dyspnea on exertion.  In July 2017 he had a left lower extremity DVT and pulmonary embolism.  Richard Sandoval had an echo 02/21/16 that revealed LVEF 50-55% with grade 1 diastolic dysfunction.  PASP was 52 mmHg.  He thinks that the shortness of breath has gotten worse since that time.  He still has lower extremity edema that is worse in the left than the right.  This started after developing cellulitis in that leg.  He has OSA and uses CPAP nightly.  He never smoked but has been exposed to secondhand smoke.  Since his last appointment Richard Sandoval was referred for an echo 06/11/17 that revealed LVEF 55-60% with normal diastolic function.   Richard Sandoval had lumbar decompression surgery 11/2017.  He reported success with losing weight (60 pounds) but continued to have exertional dyspnea that seem to be getting worse.  He had an echo 07/2021 that revealed LVEF 45 to 50% and grade 1 diastolic dysfunction.  His right ventricular function and pressure were normal.  Ascending aorta was mildly dilated at 4.3 cm.  He had a coronary CTA that was technically difficult.  However there was concern for moderate mid LAD stenosis and 50 to 60% left main stenosis.  There is also mild proximal RCA  stenosis.  The study was not high enough quality for FFR analysis.  He had noted that the toes on his L foot have been darker.  He also gets pain in his feet when he first stands in the AM. He was referred for left and right heart cath 08/2021 which revealed 20-30% stenoses in all arteries. Right atrial pressure was 13 mmHg, RV 34/8, PA 30/11, PA mean 24, PCWP 12. They were unable to obtain LVEDP. Cardiac output was 8 liters, and cardiac index was 2.9. He followed up with [facility], NP and was doing well. Metoprolol  was added and Lasix  was reduced to 20 mg because he was struggling with frequent urination. At follow-up he continued to take his Lasix  only intermittently so he was switched to spironolactone .  At his visit 04/2022 Richard Sandoval was doing well.   Discussed the use of AI scribe software for clinical note transcription with the patient, who gave verbal consent to proceed.  History of Present Illness Approximately two weeks ago, he experienced a fall while stepping up on a step at a corner store, resulting in two small fractures in his hand. He initially did not realize the extent of his injuries until a week later. He describes limited movement and pain in the affected hand.  He has Parkinson's disease and has been attempting to attend support/exercise classes, but has faced issues with class cancellations and lack of communication. He met  another individual with Parkinson's who shared insights about the disease, including changes in voice and breathing difficulties. He experiences shortness of breath, which he was informed could be related to Parkinson's.  He notes a significant weight loss of about eighty pounds, attributing it to a decreased appetite and dietary changes, such as switching to diet drinks and water. He also mentions a lack of interest in activities he previously enjoyed, like attending football games.  He reports ongoing swelling, which he describes as 'still pretty tight,'  but notes some improvement. He mentions difficulty wearing regular shoes due to tightness and uses a rubber band to keep his clog from sliding off.  He is currently taking Lasix , spironolactone , rosuvastatin , and Xarelto. He also uses a CPAP machine, which he feels is working well for him. He has an upcoming appointment with an orthopedic doctor and a neurologist.  He shares a past incident where he was left at an eye doctor's office after being dilated, which made driving home difficult and frightening. He has not returned to that doctor since.  No shortness of breath when lying down and his CPAP machine is working fine. No new swelling and his current swelling is better than usual.  ROS:  As per HPI  Studies Reviewed: SABRA   EKG Interpretation Date/Time:  Monday May 30 2024 11:33:11 EST Ventricular Rate:  80 PR Interval:  186 QRS Duration:  114 QT Interval:  418 QTC Calculation: 482 R Axis:   -21  Text Interpretation: Sinus rhythm with Premature atrial complexes with Abberant conduction Prolonged QT No significant change since last tracing Confirmed by Raford Riggs ([id number]) on 05/30/2024 11:40:01 AM   LHC/RHC 09/11/21:   1st Mrg lesion is 30% stenosed.   Prox LAD lesion is 20% stenosed.   Dist LAD lesion is 30% stenosed.   Mid LM lesion is 30% stenosed.   1.  Mild obstructive coronary artery disease with RFR negative left main disease. 2.  Cardiac output of 8 L/min and an index of 2.9 L/min with wedge pressure of 12 mmHg and RA pressure of 13 mmHg; LVEDP was unable to be assessed due to radial and brachial vasospasm.   Echo 07/2021:  1. Left ventricular ejection fraction, by estimation, is 45 to 50%. The  left ventricle has mildly decreased function. Left ventricular endocardial  border not optimally defined to evaluate regional wall motion. The left  ventricular internal cavity size  was mildly dilated. Left ventricular diastolic parameters are consistent  with Grade I  diastolic dysfunction (impaired relaxation).   2. Right ventricular systolic function is normal. The right ventricular  size is normal. There is normal pulmonary artery systolic pressure.   3. Left atrial size was moderately dilated.   4. The mitral valve was not well visualized. No evidence of mitral valve  regurgitation. No evidence of mitral stenosis.   5. The aortic valve was not well visualized. Aortic valve regurgitation  is not visualized. No aortic stenosis is present.   6. Aortic dilatation noted. There is mild dilatation of the ascending  aorta, measuring 43 mm.   Risk Assessment/Calculations:             Physical Exam:   VS:  BP 118/78 (BP Location: Left Arm, Patient Position: Sitting, Cuff Size: Large)   Pulse 80   Ht 5' 10 (1.778 m)   Wt (!) 328 lb (148.8 kg)   SpO2 95%   BMI 47.06 kg/m  , BMI Body mass index is 47.06 kg/m. GENERAL:  Well appearing HEENT: Pupils equal round and reactive, fundi not visualized, oral mucosa unremarkable NECK:  No jugular venous distention, waveform within normal limits, carotid upstroke brisk and symmetric, no bruits, no thyromegaly LUNGS:  Clear to auscultation bilaterally HEART:  RRR.  PMI not displaced or sustained,S1 and S2 within normal limits, no S3, no S4, no clicks, no rubs, no murmurs ABD:  Flat, positive bowel sounds normal in frequency in pitch, no bruits, no rebound, no guarding, no midline pulsatile mass, no hepatomegaly, no splenomegaly EXT:  2 plus pulses throughout, no edema, no cyanosis no clubbing SKIN:  No rashes no nodules NEURO:  Cranial nerves II through XII grossly intact, motor grossly intact throughout PSYCH:  Cognitively intact, oriented to person place and time   ASSESSMENT AND PLAN: .    Assessment & Plan # HFmrEF:  LVEF 45-50%.  He has LE edema that is chronic.  Respiratory status is stable.  Blood pressure controlled.  - Ordered echocardiogram to assess cardiac function and aortic status. - Continue  Lasix  for fluid management. - Continue spironolactone  for blood pressure control. - Continue CPAP therapy. - Consider SGLT2i.  # Peripheral edema.  Chronic lymphedema.   - Continue current management with Lasix  and spironolactone .  Better now than it has been in the past.   # Obstructive sleep apnea Well-managed with CPAP therapy. - Continue CPAP therapy.  # Atherosclerotic heart disease of native coronary artery without angina - Continue rosuvastatin  for cholesterol management. - Continue Xarelto for anticoagulation.        Dispo: f/u in 1 year  Signed, Annabella Scarce, MD

## 2024-06-02 ENCOUNTER — Ambulatory Visit (INDEPENDENT_AMBULATORY_CARE_PROVIDER_SITE_OTHER): Admitting: Neurology

## 2024-06-02 VITALS — BP 136/88 | HR 86 | Wt 296.0 lb

## 2024-06-02 DIAGNOSIS — G20A1 Parkinson's disease without dyskinesia, without mention of fluctuations: Secondary | ICD-10-CM | POA: Diagnosis not present

## 2024-06-02 NOTE — Patient Instructions (Signed)
 Restart carbidopa /levodopa  25/100, 1 at 10am x 1 week and then 1 at 10am and 2pm x 1 week and then take carbidopa /levodopa  25/100, 1 at 10am/2pm/6pm    As a reminder, carbidopa /levodopa  can be taken at the same time as a carbohydrate, but we like to have you take your pill either 30 minutes before a protein source or 1 hour after as protein can interfere with carbidopa /levodopa  absorption.   GO BUCKEYES!

## 2024-06-27 ENCOUNTER — Ambulatory Visit (INDEPENDENT_AMBULATORY_CARE_PROVIDER_SITE_OTHER)

## 2024-06-27 DIAGNOSIS — I5042 Chronic combined systolic (congestive) and diastolic (congestive) heart failure: Secondary | ICD-10-CM

## 2024-06-27 LAB — ECHOCARDIOGRAM COMPLETE
Area-P 1/2: 5.13 cm2
S' Lateral: 3.73 cm

## 2024-06-27 MED ORDER — PERFLUTREN LIPID MICROSPHERE
1.0000 mL | INTRAVENOUS | Status: AC | PRN
  Administered 2024-06-27: 1 mL via INTRAVENOUS

## 2024-07-06 NOTE — Therapy (Signed)
 OUTPATIENT PHYSICAL THERAPY NEURO EVALUATION   Patient Name: Richard Sandoval MRN: [id number] DOB:April 27, 1946, 78 y.o., male Today's Date: 07/07/2024   PCP: [name], MD  REFERRING PROVIDER: Tat, Asberry RAMAN, DO  END OF SESSION:  PT End of Session - 07/07/24 1624     Visit Number 1    Number of Visits 17    Date for Recertification  09/01/24    Authorization Type Medicare/Aetna    PT Start Time 1540   pt late   PT Stop Time 1618    PT Time Calculation (min) 38 min    Activity Tolerance Patient tolerated treatment well    Behavior During Therapy [facility] for tasks assessed/performed          Past Medical History:  Diagnosis Date   Acute on chronic combined systolic and diastolic CHF (congestive heart failure) (HCC) 09/06/2021   CAD in native artery 09/06/2021   Chronic combined systolic and diastolic heart failure (HCC) 09/06/2021   DVT (deep venous thrombosis) (HCC)    L leg in 2009   Dyspnea    GERD (gastroesophageal reflux disease)    Hypertension    enlarged heart   Morbid obesity (HCC)    OSA (obstructive sleep apnea)    on CPAP since 1995   Pancreatitis    Pulmonary hypertension (HCC) 06/04/2017   Right heart failure (HCC) 08/27/2017   Past Surgical History:  Procedure Laterality Date   BACK SURGERY  04/09/1999   cervical laminectomy and fusion     CHOLECYSTECTOMY N/A 01/25/2014   Procedure: LAPAROSCOPIC CHOLECYSTECTOMY WITH INTRAOPERATIVE CHOLANGIOGRAM;  Surgeon: Krystal CHRISTELLA Spinner, MD;  [facility]: [facility];  Service: General;  Laterality: N/A;   COLONOSCOPY     COLONOSCOPY WITH PROPOFOL  N/A 03/04/2019   Procedure: COLONOSCOPY WITH PROPOFOL ;  Surgeon: Dianna Specking, MD;  [facility];  Service: Endoscopy;  Laterality: N/A;   CORONARY PRESSURE/FFR STUDY N/A 09/11/2021   Procedure: INTRAVASCULAR PRESSURE WIRE/FFR STUDY;  Surgeon: Wendel Lurena POUR, MD;  [facility];  Service: Cardiovascular;  Laterality: N/A;   EUS N/A 01/24/2014   Procedure: UPPER  ENDOSCOPIC ULTRASOUND (EUS) RADIAL;  Surgeon: Elsie Cree, MD;  [facility];  Service: Endoscopy;  Laterality: N/A;   KNEE SURGERY  1998   left inguinal hernia     LUMBAR LAMINECTOMY     RIGHT/LEFT HEART CATH AND CORONARY ANGIOGRAPHY N/A 09/11/2021   Procedure: RIGHT/LEFT HEART CATH AND CORONARY ANGIOGRAPHY;  Surgeon: Wendel Lurena POUR, MD;  [facility];  Service: Cardiovascular;  Laterality: N/A;   TRIGGER FINGER RELEASE  08/04/2012   Procedure: MINOR RELEASE TRIGGER FINGER/A-1 PULLEY;  Surgeon: Arley JONELLE Curia, MD;  Location: [facility];  Service: Orthopedics;  Laterality: Right;   Patient Active Problem List   Diagnosis Date Noted   Chronic combined systolic and diastolic heart failure (HCC) 09/06/2021   CAD in native artery 09/06/2021   History of colonic polyps 03/04/2019   Lumbar stenosis with neurogenic claudication 12/07/2017   Right heart failure (HCC) 08/27/2017   Pulmonary hypertension (HCC) 06/04/2017   GERD (gastroesophageal reflux disease) 02/21/2016   Bilateral pulmonary embolism (HCC) 02/20/2016   Hypokalemia 01/25/2014   Pancreatitis, gallstone 01/25/2014   Morbid obesity (HCC)    Pancreatitis 01/05/2014   Abnormal transaminases 01/05/2014   Dyspnea 12/15/2013   Upper airway cough syndrome 09/27/2013   Cellulitis and abscess 05/10/2012   Morbid (severe) obesity due to excess calories (HCC) 09/24/2006   DEPRESSIVE DISORDER, NOS 09/24/2006  BACK PAIN, LOW 09/24/2006   OSA (obstructive sleep apnea) 09/24/2006    ONSET DATE: pt diagnosed 3 months ago   REFERRING DIAG: G20.A1 (ICD-10-CM) - Parkinson's disease without dyskinesia or fluctuating manifestations (HCC)  THERAPY DIAG:  Parkinson's disease without dyskinesia or fluctuating manifestations (HCC)  Other abnormalities of gait and mobility  Muscle weakness (generalized)  Rationale for Evaluation and Treatment: Rehabilitation  SUBJECTIVE:                                                                                                                                                                                              SUBJECTIVE STATEMENT: Pt reports that he had a fall, fracturing the R hand however the L hand hurts worse. Reports difficulty transferring into tub shower. Some difficulty getting out of his hospital bed. Reports that his MD did not tell me what I can and cannot do as far as R hand fx. Pt reports that he takes small steps; denies freezing episodes. Notes that his grandkids have a hard time hearing him.  Pt accompanied by: self  PERTINENT HISTORY: CHF, CAD, HTN, pulmonary HTN, cervical laminectomy and fusion, lumbar laminectomy, knee surgery  ED imaging 05/27/24 showed R hand/wrist fx.  Per 06/14/24 Dr. Reina note Today the findings were reviewed with the patient after program in detail we talked about the reason the rationale for weaning out of the brace. Gradual use and activity of the right hand. The patient was reassured of the findings of the left hand.  PAIN:  Are you having pain? No  PRECAUTIONS: Fall  RED FLAGS: None   WEIGHT BEARING RESTRICTIONS: Patient reports that he was not given any precautions for the R hand. Pt is wearing R wrist lace up brace today  FALLS: Has patient fallen in last 6 months? Yes. Number of falls 1  LIVING ENVIRONMENT: Lives with: lives with their spouse and lives with their daughter Lives in: House/apartment Stairs: 1 step to enter Has following equipment at home: Counselling psychologist, Environmental Consultant - 2 wheeled, and hospital bed  PLOF: Independent with basic ADLs, Independent with household mobility with device, and Vocation/Vocational requirements: retired  PATIENT GOALS: improve ability to get out of bed and get into tub   OBJECTIVE:  Note: Objective measures were completed at Evaluation unless otherwise noted.   *pt had difficulty standing up from waiting room chair. Eval was performed  with pt sitting on elevated mat.   DIAGNOSTIC FINDINGS:  05/27/24 hand xray: Acute mildly displaced third metacarpal shaft fracture. Possible tiny ulnar styloid process fracture.   05/27/24 wrist xray:Possible tiny fracture at the tip of  ulnar styloid process. See separately dictated hand radiographs   COGNITION: Overall cognitive status: Within functional limits for tasks assessed   SENSATION: Pt denies N/T in UEs/LEs  COORDINATION: Alternating pronation/supination: slowed B Alternating toe tap: WNL Finger to nose: slowed on L   MUSCLE TONE: WFL B LEs    POSTURE: rounded shoulders and forward head, flexed trunk  LOWER EXTREMITY ROM:     Active  Right Eval Left Eval  Hip flexion    Hip extension    Hip abduction    Hip adduction    Hip internal rotation    Hip external rotation    Knee flexion    Knee extension    Ankle dorsiflexion 9 11  Ankle plantarflexion    Ankle inversion    Ankle eversion     (Blank rows = not tested)  LOWER EXTREMITY MMT:    MMT Right Eval Left Eval  Hip flexion 5 5  Hip extension    Hip abduction 5 5  Hip adduction 4+ 4+  Hip internal rotation    Hip external rotation    Knee flexion 4+ 4+  Knee extension 4+ 5  Ankle dorsiflexion 4+ 4+  Ankle plantarflexion    Ankle inversion    Ankle eversion    (Blank rows = not tested)  GAIT: Findings: Assistive device utilized:Quad cane small base, Level of assistance: SBA, and Comments: step-to/step through with short B step length and flexed trunk   FUNCTIONAL TESTS:  5xSTS from hi-low table on lowest setting and with B UE support: 39.21 sec  89M walk: 31.98 sec with quad cane (1.03 ft/sec)                                                                                                                             TREATMENT DATE: 07/07/24    PATIENT EDUCATION: Education details: prognosis, POC, edu on exam findings and how they relate to functional impairments; answered pt's  questions on how to modify. Educated on pt's low voice volume and how ST could help with that Person educated: Patient Education method: Explanation Education comprehension: verbalized understanding  HOME EXERCISE PROGRAM: Not yet initiated   GOALS: Goals reviewed with patient? Yes  SHORT TERM GOALS: Target date: 08/04/2024  Patient to be independent with initial HEP. Baseline: HEP initiated Goal status: INITIAL    LONG TERM GOALS: Target date: 09/01/2024  Patient to be independent with advanced HEP. Baseline: Not yet initiated  Goal status: INITIAL  Patient to verbalize understanding of local Parkinson's Disease community resources including community fitness post D/C.   Baseline: Not yet initiated  Goal status: INITIAL  Patient to improve Berg score to at least 45/56 to decrease risk of falls.  Baseline: NT Goal status: INITIAL  Patient to obtain tub bench to improve safety/confidence with bathing.  Baseline: does not have adaptive equipment for this Goal status: INITIAL  Patient to demonstrate gait speed of at least 1.9 ft/sec in  order to reduce falls risk.  Baseline: 1.03 ft/sec Goal status: INITIAL  Patient to report 40% improvement in ease of performing bed mobility.  Baseline: - Goal status: INITIAL  Patient to demonstrate 5xSTS test in <20 sec in order to decrease risk of falls.   Baseline: 39.21 sec from hi-low table on lowest setting and with B UE support Goal status: INITIAL  Patient to verbalize understanding of fall prevention in home environment information. Baseline: Not yet initiated Goal status: INITIAL    ASSESSMENT:  CLINICAL IMPRESSION:  Patient is a 78 y/o F presenting to OPPT with c/o limited mobility and recent Parkinson's diagnosis. Patient also with fall occurring 05/27/24 resulting in R hand/wrist fx. Patient today presenting with Slowed coordination testing, rounded/flexed posture, gait deviations, difficulty completing transfers, and  slowed gait speed. Patients gait speed and transfer speed indicate an increased risk of falls. Would benefit from skilled PT services 2 x/week for 8 weeks to address aforementioned impairments in order to optimize level of function.    OBJECTIVE IMPAIRMENTS: Abnormal gait, decreased activity tolerance, decreased balance, decreased coordination, decreased knowledge of use of DME, difficulty walking, decreased strength, postural dysfunction, and pain.   ACTIVITY LIMITATIONS: carrying, lifting, bending, sitting, standing, squatting, sleeping, stairs, transfers, bed mobility, bathing, toileting, dressing, reach over head, hygiene/grooming, and locomotion level  PARTICIPATION LIMITATIONS: meal prep, cleaning, laundry, shopping, community activity, and church  PERSONAL FACTORS: Age, Fitness, Time since onset of injury/illness/exacerbation, and 3+ comorbidities: CHF, CAD, HTN, pulmonary HTN, cervical laminectomy and fusion, lumbar laminectomy, knee surgery  are also affecting patient's functional outcome.   REHAB POTENTIAL: Good  CLINICAL DECISION MAKING: Evolving/moderate complexity  EVALUATION COMPLEXITY: Moderate  PLAN:  PT FREQUENCY: 2x/week  PT DURATION: 8 weeks  PLANNED INTERVENTIONS: 97164- PT Re-evaluation, 97110-Therapeutic exercises, 97530- Therapeutic activity, 97112- Neuromuscular re-education, 97535- Self Care, 02859- Manual therapy, [phone]- Gait training, [phone]- Canalith repositioning, [id number]- Aquatic Therapy, [phone] (1-2 muscles), [phone] (3+ muscles)- Dry Needling, Patient/Family education, Balance training, Stair training, Taping, Joint mobilization, Spinal mobilization, Vestibular training, DME instructions, Cryotherapy, and Moist heat  PLAN FOR NEXT SESSION: Berg, edu on tub bench,  initiate HEP for functional mobility and transfers; pt is newly diagnosed and needs edu on PD symptoms and etiology as well as community resources    Louana Terrilyn Christians, [place], DPT 07/07/2024 4:41  PM  [facility] at [facility] [address] [place], [place] [address] Phone # [phone] Fax # [phone]

## 2024-07-07 ENCOUNTER — Other Ambulatory Visit: Payer: Self-pay

## 2024-07-07 ENCOUNTER — Encounter: Payer: Self-pay | Admitting: Physical Therapy

## 2024-07-07 ENCOUNTER — Ambulatory Visit: Admitting: Physical Therapy

## 2024-07-07 DIAGNOSIS — M6281 Muscle weakness (generalized): Secondary | ICD-10-CM

## 2024-07-07 DIAGNOSIS — R2689 Other abnormalities of gait and mobility: Secondary | ICD-10-CM

## 2024-07-07 DIAGNOSIS — G20A1 Parkinson's disease without dyskinesia, without mention of fluctuations: Secondary | ICD-10-CM | POA: Insufficient documentation

## 2024-07-12 NOTE — Therapy (Incomplete)
 OUTPATIENT PHYSICAL THERAPY NEURO TREATMENT   Patient Name: Richard Sandoval MRN: [id number] DOB:March 08, 1946, 78 y.o., male Today's Date: 07/12/2024   PCP: [name], MD  REFERRING PROVIDER: Tat, Asberry RAMAN, DO  END OF SESSION:    Past Medical History:  Diagnosis Date   Acute on chronic combined systolic and diastolic CHF (congestive heart failure) (HCC) 09/06/2021   CAD in native artery 09/06/2021   Chronic combined systolic and diastolic heart failure (HCC) 09/06/2021   DVT (deep venous thrombosis) (HCC)    L leg in 2009   Dyspnea    GERD (gastroesophageal reflux disease)    Hypertension    enlarged heart   Morbid obesity (HCC)    OSA (obstructive sleep apnea)    on CPAP since 1995   Pancreatitis    Pulmonary hypertension (HCC) 06/04/2017   Right heart failure (HCC) 08/27/2017   Past Surgical History:  Procedure Laterality Date   BACK SURGERY  04/09/1999   cervical laminectomy and fusion     CHOLECYSTECTOMY N/A 01/25/2014   Procedure: LAPAROSCOPIC CHOLECYSTECTOMY WITH INTRAOPERATIVE CHOLANGIOGRAM;  Surgeon: Krystal CHRISTELLA Spinner, MD;  [facility];  Service: General;  Laterality: N/A;   COLONOSCOPY     COLONOSCOPY WITH PROPOFOL  N/A 03/04/2019   Procedure: COLONOSCOPY WITH PROPOFOL ;  Surgeon: Dianna Specking, MD;  [facility];  Service: Endoscopy;  Laterality: N/A;   CORONARY PRESSURE/FFR STUDY N/A 09/11/2021   Procedure: INTRAVASCULAR PRESSURE WIRE/FFR STUDY;  Surgeon: Wendel Lurena POUR, MD;  [facility];  Service: Cardiovascular;  Laterality: N/A;   EUS N/A 01/24/2014   Procedure: UPPER ENDOSCOPIC ULTRASOUND (EUS) RADIAL;  Surgeon: Elsie Cree, MD;  [facility];  Service: Endoscopy;  Laterality: N/A;   KNEE SURGERY  1998   left inguinal hernia     LUMBAR LAMINECTOMY     RIGHT/LEFT HEART CATH AND CORONARY ANGIOGRAPHY N/A 09/11/2021   Procedure: RIGHT/LEFT HEART CATH AND CORONARY ANGIOGRAPHY;  Surgeon: Wendel Lurena POUR, MD;  [facility];  Service: Cardiovascular;  Laterality: N/A;   TRIGGER FINGER RELEASE  08/04/2012   Procedure: MINOR RELEASE TRIGGER FINGER/A-1 PULLEY;  Surgeon: Arley JONELLE Curia, MD;  Location: [facility];  Service: Orthopedics;  Laterality: Right;   Patient Active Problem List   Diagnosis Date Noted   Chronic combined systolic and diastolic heart failure (HCC) 09/06/2021   CAD in native artery 09/06/2021   History of colonic polyps 03/04/2019   Lumbar stenosis with neurogenic claudication 12/07/2017   Right heart failure (HCC) 08/27/2017   Pulmonary hypertension (HCC) 06/04/2017   GERD (gastroesophageal reflux disease) 02/21/2016   Bilateral pulmonary embolism (HCC) 02/20/2016   Hypokalemia 01/25/2014   Pancreatitis, gallstone 01/25/2014   Morbid obesity (HCC)    Pancreatitis 01/05/2014   Abnormal transaminases 01/05/2014   Dyspnea 12/15/2013   Upper airway cough syndrome 09/27/2013   Cellulitis and abscess 05/10/2012   Morbid (severe) obesity due to excess calories (HCC) 09/24/2006   DEPRESSIVE DISORDER, NOS 09/24/2006   BACK PAIN, LOW 09/24/2006   OSA (obstructive sleep apnea) 09/24/2006    ONSET DATE: pt diagnosed 3 months ago   REFERRING DIAG: G20.A1 (ICD-10-CM) - Parkinson's disease without dyskinesia or fluctuating manifestations (HCC)  THERAPY DIAG:  No diagnosis found.  Rationale for Evaluation and Treatment: Rehabilitation  SUBJECTIVE:  SUBJECTIVE STATEMENT: Pt reports that he had a fall, fracturing the R hand however the L hand hurts worse. Reports difficulty transferring into tub shower. Some difficulty getting out of his hospital bed. Reports that his MD did not tell me what I can and cannot do as far as R hand fx. Pt reports that he takes small steps; denies freezing  episodes. Notes that his grandkids have a hard time hearing him.  Pt accompanied by: self  PERTINENT HISTORY: CHF, CAD, HTN, pulmonary HTN, cervical laminectomy and fusion, lumbar laminectomy, knee surgery  ED imaging 05/27/24 showed R hand/wrist fx.  Per 06/14/24 Dr. Reina note Today the findings were reviewed with the patient after program in detail we talked about the reason the rationale for weaning out of the brace. Gradual use and activity of the right hand. The patient was reassured of the findings of the left hand.  PAIN:  Are you having pain? No  PRECAUTIONS: Fall  RED FLAGS: None   WEIGHT BEARING RESTRICTIONS: Patient reports that he was not given any precautions for the R hand. Pt is wearing R wrist lace up brace today  FALLS: Has patient fallen in last 6 months? Yes. Number of falls 1  LIVING ENVIRONMENT: Lives with: lives with their spouse and lives with their daughter Lives in: House/apartment Stairs: 1 step to enter Has following equipment at home: Counselling psychologist, Environmental Consultant - 2 wheeled, and hospital bed  PLOF: Independent with basic ADLs, Independent with household mobility with device, and Vocation/Vocational requirements: retired  PATIENT GOALS: improve ability to get out of bed and get into tub   OBJECTIVE:      TODAY'S TREATMENT: 07/13/24 Activity Comments                            Note: Objective measures were completed at Evaluation unless otherwise noted.   *pt had difficulty standing up from waiting room chair. Eval was performed with pt sitting on elevated mat.   DIAGNOSTIC FINDINGS:  05/27/24 hand xray: Acute mildly displaced third metacarpal shaft fracture. Possible tiny ulnar styloid process fracture.   05/27/24 wrist xray:Possible tiny fracture at the tip of ulnar styloid process. See separately dictated hand radiographs   COGNITION: Overall cognitive status: Within functional limits for tasks  assessed   SENSATION: Pt denies N/T in UEs/LEs  COORDINATION: Alternating pronation/supination: slowed B Alternating toe tap: WNL Finger to nose: slowed on L   MUSCLE TONE: WFL B LEs    POSTURE: rounded shoulders and forward head, flexed trunk  LOWER EXTREMITY ROM:     Active  Right Eval Left Eval  Hip flexion    Hip extension    Hip abduction    Hip adduction    Hip internal rotation    Hip external rotation    Knee flexion    Knee extension    Ankle dorsiflexion 9 11  Ankle plantarflexion    Ankle inversion    Ankle eversion     (Blank rows = not tested)  LOWER EXTREMITY MMT:    MMT Right Eval Left Eval  Hip flexion 5 5  Hip extension    Hip abduction 5 5  Hip adduction 4+ 4+  Hip internal rotation    Hip external rotation    Knee flexion 4+ 4+  Knee extension 4+ 5  Ankle dorsiflexion 4+ 4+  Ankle plantarflexion    Ankle inversion    Ankle eversion    (  Blank rows = not tested)  GAIT: Findings: Assistive device utilized:Quad cane small base, Level of assistance: SBA, and Comments: step-to/step through with short B step length and flexed trunk   FUNCTIONAL TESTS:  5xSTS from hi-low table on lowest setting and with B UE support: 39.21 sec  43M walk: 31.98 sec with quad cane (1.03 ft/sec)                                                                                                                             TREATMENT DATE: 07/07/24    PATIENT EDUCATION: Education details: prognosis, POC, edu on exam findings and how they relate to functional impairments; answered pt's questions on how to modify. Educated on pt's low voice volume and how ST could help with that Person educated: Patient Education method: Explanation Education comprehension: verbalized understanding  HOME EXERCISE PROGRAM: Not yet initiated   GOALS: Goals reviewed with patient? Yes  SHORT TERM GOALS: Target date: 08/04/2024  Patient to be independent with initial  HEP. Baseline: HEP initiated Goal status: IN PROGRESS    LONG TERM GOALS: Target date: 09/01/2024  Patient to be independent with advanced HEP. Baseline: Not yet initiated  Goal status: IN PROGRESS  Patient to verbalize understanding of local Parkinson's Disease community resources including community fitness post D/C.   Baseline: Not yet initiated  Goal status: IN PROGRESS  Patient to improve Berg score to at least 45/56 to decrease risk of falls.  Baseline: NT Goal status: IN PROGRESS  Patient to obtain tub bench to improve safety/confidence with bathing.  Baseline: does not have adaptive equipment for this Goal status: IN PROGRESS  Patient to demonstrate gait speed of at least 1.9 ft/sec in order to reduce falls risk.  Baseline: 1.03 ft/sec Goal status: IN PROGRESS  Patient to report 40% improvement in ease of performing bed mobility.  Baseline: - Goal status: IN PROGRESS  Patient to demonstrate 5xSTS test in <20 sec in order to decrease risk of falls.   Baseline: 39.21 sec from hi-low table on lowest setting and with B UE support Goal status: IN PROGRESS  Patient to verbalize understanding of fall prevention in home environment information. Baseline: Not yet initiated Goal status: IN PROGRESS    ASSESSMENT:  CLINICAL IMPRESSION:  Patient is a 78 y/o F presenting to OPPT with c/o limited mobility and recent Parkinson's diagnosis. Patient also with fall occurring 05/27/24 resulting in R hand/wrist fx. Patient today presenting with Slowed coordination testing, rounded/flexed posture, gait deviations, difficulty completing transfers, and slowed gait speed. Patients gait speed and transfer speed indicate an increased risk of falls. Would benefit from skilled PT services 2 x/week for 8 weeks to address aforementioned impairments in order to optimize level of function.    OBJECTIVE IMPAIRMENTS: Abnormal gait, decreased activity tolerance, decreased balance, decreased  coordination, decreased knowledge of use of DME, difficulty walking, decreased strength, postural dysfunction, and pain.   ACTIVITY LIMITATIONS: carrying, lifting, bending, sitting, standing, squatting,  sleeping, stairs, transfers, bed mobility, bathing, toileting, dressing, reach over head, hygiene/grooming, and locomotion level  PARTICIPATION LIMITATIONS: meal prep, cleaning, laundry, shopping, community activity, and church  PERSONAL FACTORS: Age, Fitness, Time since onset of injury/illness/exacerbation, and 3+ comorbidities: CHF, CAD, HTN, pulmonary HTN, cervical laminectomy and fusion, lumbar laminectomy, knee surgery  are also affecting patient's functional outcome.   REHAB POTENTIAL: Good  CLINICAL DECISION MAKING: Evolving/moderate complexity  EVALUATION COMPLEXITY: Moderate  PLAN:  PT FREQUENCY: 2x/week  PT DURATION: 8 weeks  PLANNED INTERVENTIONS: 97164- PT Re-evaluation, 97110-Therapeutic exercises, 97530- Therapeutic activity, 97112- Neuromuscular re-education, 97535- Self Care, 02859- Manual therapy, [phone]- Gait training, [phone]- Canalith repositioning, [id number]- Aquatic Therapy, [phone] (1-2 muscles), 20561 (3+ muscles)- Dry Needling, Patient/Family education, Balance training, Stair training, Taping, Joint mobilization, Spinal mobilization, Vestibular training, DME instructions, Cryotherapy, and Moist heat  PLAN FOR NEXT SESSION: Berg, edu on tub bench,  initiate HEP for functional mobility and transfers; pt is newly diagnosed and needs edu on PD symptoms and etiology as well as community resources    Louana Terrilyn Christians, [place], DPT 07/12/2024 8:10 AM  [facility] at [facility] [address] [place], [place] [address] Phone # [phone] Fax # [phone]

## 2024-07-13 ENCOUNTER — Ambulatory Visit: Admitting: Physical Therapy

## 2024-07-13 ENCOUNTER — Telehealth: Payer: Self-pay | Admitting: Physical Therapy

## 2024-07-13 NOTE — Telephone Encounter (Signed)
 Called patient and left VM according to DPR in chart. Notified pt of missed appointment today and reminded of upcoming appointment day/time.  Louana Terrilyn Christians, PT, DPT 07/13/2024 8:33 AM  [facility] at [facility] [address] [place], [address] [place], [place] [address] Phone # [phone] Fax # [phone]

## 2024-07-18 ENCOUNTER — Encounter: Payer: Self-pay | Admitting: Physical Therapy

## 2024-07-18 ENCOUNTER — Ambulatory Visit: Admitting: Physical Therapy

## 2024-07-18 DIAGNOSIS — R2689 Other abnormalities of gait and mobility: Secondary | ICD-10-CM

## 2024-07-18 DIAGNOSIS — M6281 Muscle weakness (generalized): Secondary | ICD-10-CM

## 2024-07-18 DIAGNOSIS — G20A1 Parkinson's disease without dyskinesia, without mention of fluctuations: Secondary | ICD-10-CM | POA: Diagnosis not present

## 2024-07-18 NOTE — Therapy (Signed)
 " OUTPATIENT PHYSICAL THERAPY NEURO TREATMENT   Patient Name: Richard Sandoval MRN: [id number] DOB:[birth year], 78 y.o., male Today's Date: 07/18/2024   PCP: [name], MD  REFERRING PROVIDER: Tat, Asberry RAMAN, DO  END OF SESSION:  PT End of Session - 07/18/24 1336     Visit Number 2    Number of Visits 17    Date for Recertification  09/01/24    Authorization Type Medicare/[employer]    PT Start Time 1336   pt arrives late; agreeable to shorter PT time   PT Stop Time 1406    PT Time Calculation (min) 30 min    Activity Tolerance Patient tolerated treatment well    Behavior During Therapy [facility] for tasks assessed/performed           Past Medical History:  Diagnosis Date   Acute on chronic combined systolic and diastolic CHF (congestive heart failure) (HCC) 09/06/2021   CAD in native artery 09/06/2021   Chronic combined systolic and diastolic heart failure (HCC) 09/06/2021   DVT (deep venous thrombosis) (HCC)    L leg in 2009   Dyspnea    GERD (gastroesophageal reflux disease)    Hypertension    enlarged heart   Morbid obesity (HCC)    OSA (obstructive sleep apnea)    on CPAP since 1995   Pancreatitis    Pulmonary hypertension (HCC) 06/04/2017   Right heart failure (HCC) 08/27/2017   Past Surgical History:  Procedure Laterality Date   BACK SURGERY  04/09/1999   cervical laminectomy and fusion     CHOLECYSTECTOMY N/A 01/25/2014   Procedure: LAPAROSCOPIC CHOLECYSTECTOMY WITH INTRAOPERATIVE CHOLANGIOGRAM;  Surgeon: Krystal CHRISTELLA Spinner, MD;  Location: [facility];  Service: General;  Laterality: N/A;   COLONOSCOPY     COLONOSCOPY WITH PROPOFOL  N/A 03/04/2019   Procedure: COLONOSCOPY WITH PROPOFOL ;  Surgeon: Dianna Specking, MD;  Location: [facility];  Service: Endoscopy;  Laterality: N/A;   CORONARY PRESSURE/FFR STUDY N/A 09/11/2021   Procedure: INTRAVASCULAR PRESSURE WIRE/FFR STUDY;  Surgeon: Wendel Lurena POUR, MD;  Location: [facility];  Service: Cardiovascular;  Laterality: N/A;    EUS N/A 01/24/2014   Procedure: UPPER ENDOSCOPIC ULTRASOUND (EUS) RADIAL;  Surgeon: Elsie Cree, MD;  Location: [facility];  Service: Endoscopy;  Laterality: N/A;   KNEE SURGERY  1998   left inguinal hernia     LUMBAR LAMINECTOMY     RIGHT/LEFT HEART CATH AND CORONARY ANGIOGRAPHY N/A 09/11/2021   Procedure: RIGHT/LEFT HEART CATH AND CORONARY ANGIOGRAPHY;  Surgeon: Wendel Lurena POUR, MD;  Location: [facility];  Service: Cardiovascular;  Laterality: N/A;   TRIGGER FINGER RELEASE  08/04/2012   Procedure: MINOR RELEASE TRIGGER FINGER/A-1 PULLEY;  Surgeon: Arley JONELLE Curia, MD;  Location: [facility];  Service: Orthopedics;  Laterality: Right;   Patient Active Problem List   Diagnosis Date Noted   Chronic combined systolic and diastolic heart failure (HCC) 09/06/2021   CAD in native artery 09/06/2021   History of colonic polyps 03/04/2019   Lumbar stenosis with neurogenic claudication 12/07/2017   Right heart failure (HCC) 08/27/2017   Pulmonary hypertension (HCC) 06/04/2017   GERD (gastroesophageal reflux disease) 02/21/2016   Bilateral pulmonary embolism (HCC) 02/20/2016   Hypokalemia 01/25/2014   Pancreatitis, gallstone 01/25/2014   Morbid obesity (HCC)    Pancreatitis 01/05/2014   Abnormal transaminases 01/05/2014   Dyspnea 12/15/2013   Upper airway cough syndrome 09/27/2013   Cellulitis and abscess 05/10/2012   Morbid (severe) obesity due to excess calories (HCC)  09/24/2006   DEPRESSIVE DISORDER, NOS 09/24/2006   BACK PAIN, LOW 09/24/2006   OSA (obstructive sleep apnea) 09/24/2006    ONSET DATE: pt diagnosed 3 months ago   REFERRING DIAG: G20.A1 (ICD-10-CM) - Parkinson's disease without dyskinesia or fluctuating manifestations (HCC)  THERAPY DIAG:  Other abnormalities of gait and mobility  Muscle weakness (generalized)  Rationale for Evaluation and Treatment: Rehabilitation  SUBJECTIVE:                                                                                                                                                                                              SUBJECTIVE STATEMENT: Awaiting the sleep disorders MD visit.  Couldn't find this place-only my second time coming here. Pt accompanied by: self  PERTINENT HISTORY: CHF, CAD, HTN, pulmonary HTN, cervical laminectomy and fusion, lumbar laminectomy, knee surgery  ED imaging 05/27/24 showed R hand/wrist fx.  Per 06/14/24 Dr. Reina note Today the findings were reviewed with the patient after program in detail we talked about the reason the rationale for weaning out of the brace. Gradual use and activity of the right hand. The patient was reassured of the findings of the left hand.  PAIN:  Are you having pain? Yes: NPRS scale: 8/10 Pain location: R shoulder  Pain description: old age pain Aggravating factors: falling on it Relieving factors: heating pad  PRECAUTIONS: Fall  RED FLAGS: None   WEIGHT BEARING RESTRICTIONS: Patient reports that he was not given any precautions for the R hand. Pt is wearing R wrist lace up brace today  FALLS: Has patient fallen in last 6 months? Yes. Number of falls 1  LIVING ENVIRONMENT: Lives with: lives with their spouse and lives with their daughter Lives in: House/apartment Stairs: 1 step to enter Has following equipment at home: Counselling psychologist, Environmental Consultant - 2 wheeled, and hospital bed  PLOF: Independent with basic ADLs, Independent with household mobility with device, and Vocation/Vocational requirements: retired  PATIENT GOALS: improve ability to get out of bed and get into tub   OBJECTIVE:      TODAY'S TREATMENT: 07/18/24 Activity Comments  Berg Balance test:  26/56 Scores <45/56 indicate increased fall risk  Seated LAQ x 5 reps Seated march x 5 reps Attempted seated forward lean to upright posture, x 3 reps (pt doesn't like the discomfort in his neck)   Sit to stand transfer technique practice At edge of 20  mat-cues for scooting forward, foot placement, increased forward lean and boost forward and up with hands (as opposed to pushing down through hands)  [facility] PT Assessment - 07/18/24 1346       Standardized Balance Assessment   Standardized Balance Assessment Berg Balance Test      Berg Balance Test   Sit to Stand Able to stand  independently using hands    Standing Unsupported Able to stand 2 minutes with supervision    Sitting with Back Unsupported but Feet Supported on Floor or Stool Able to sit safely and securely 2 minutes    Stand to Sit Controls descent by using hands    Transfers Able to transfer safely, definite need of hands    Standing Unsupported with Eyes Closed Able to stand 10 seconds with supervision    Standing Unsupported with Feet Together Needs help to attain position but able to stand for 30 seconds with feet together    From Standing, Reach Forward with Outstretched Arm Can reach forward >12 cm safely (5)    From Standing Position, Pick up Object from Floor Unable to try/needs assist to keep balance    From Standing Position, Turn to Look Behind Over each Shoulder Turn sideways only but maintains balance    Turn 360 Degrees Needs assistance while turning    Standing Unsupported, Alternately Place Feet on Step/Stool Needs assistance to keep from falling or unable to try    Standing Unsupported, One Foot in Front Needs help to step but can hold 15 seconds    Standing on One Leg Unable to try or needs assist to prevent fall    Total Score 26            Access Code: [email or web address] URL: [email or web address] Date: 07/18/2024 Prepared by: [facility] - Outpatient  Rehab - [facility]  Exercises - Seated Long Arc Quad  - 1 x daily - 7 x weekly - 3 sets - 10 reps - Seated March  - 1 x daily - 7 x weekly - 3 sets - 10 reps   PATIENT EDUCATION: Education details: Hospital doctor results, transfer technique, HEP initiated Person  educated: Patient Education method: Explanation, Demonstration, Verbal cues, and Handouts Education comprehension: verbalized understanding, returned demonstration, verbal cues required, and needs further education  --------------------------------------------------- Note: Objective measures were completed at Evaluation unless otherwise noted.   *pt had difficulty standing up from waiting room chair. Eval was performed with pt sitting on elevated mat.   DIAGNOSTIC FINDINGS:  05/27/24 hand xray: Acute mildly displaced third metacarpal shaft fracture. Possible tiny ulnar styloid process fracture.   05/27/24 wrist xray:Possible tiny fracture at the tip of ulnar styloid process. See separately dictated hand radiographs   COGNITION: Overall cognitive status: Within functional limits for tasks assessed   SENSATION: Pt denies N/T in UEs/LEs  COORDINATION: Alternating pronation/supination: slowed B Alternating toe tap: WNL Finger to nose: slowed on L   MUSCLE TONE: WFL B LEs    POSTURE: rounded shoulders and forward head, flexed trunk  LOWER EXTREMITY ROM:     Active  Right Eval Left Eval  Hip flexion    Hip extension    Hip abduction    Hip adduction    Hip internal rotation    Hip external rotation    Knee flexion    Knee extension    Ankle dorsiflexion 9 11  Ankle plantarflexion    Ankle inversion    Ankle eversion     (Blank rows = not tested)  LOWER EXTREMITY MMT:    MMT Right Eval Left Eval  Hip flexion 5 5  Hip extension  Hip abduction 5 5  Hip adduction 4+ 4+  Hip internal rotation    Hip external rotation    Knee flexion 4+ 4+  Knee extension 4+ 5  Ankle dorsiflexion 4+ 4+  Ankle plantarflexion    Ankle inversion    Ankle eversion    (Blank rows = not tested)  GAIT: Findings: Assistive device utilized:Quad cane small base, Level of assistance: SBA, and Comments: step-to/step through with short B step length and flexed trunk   FUNCTIONAL  TESTS:  5xSTS from hi-low table on lowest setting and with B UE support: 39.21 sec  21M walk: 31.98 sec with quad cane (1.03 ft/sec)                                                                                                                             TREATMENT DATE: 07/07/24    PATIENT EDUCATION: Education details: prognosis, POC, edu on exam findings and how they relate to functional impairments; answered pt's questions on how to modify. Educated on pt's low voice volume and how ST could help with that Person educated: Patient Education method: Explanation Education comprehension: verbalized understanding  HOME EXERCISE PROGRAM: Not yet initiated   GOALS: Goals reviewed with patient? Yes  SHORT TERM GOALS: Target date: 08/04/2024  Patient to be independent with initial HEP. Baseline: HEP initiated Goal status: IN PROGRESS    LONG TERM GOALS: Target date: 09/01/2024  Patient to be independent with advanced HEP. Baseline: Not yet initiated  Goal status: IN PROGRESS  Patient to verbalize understanding of local Parkinson's Disease community resources including community fitness post D/C.   Baseline: Not yet initiated  Goal status: IN PROGRESS  Patient to improve Berg score to at least 45/56 to decrease risk of falls.  Baseline:  26/56 Goal status: IN PROGRESS  Patient to obtain tub bench to improve safety/confidence with bathing.  Baseline: does not have adaptive equipment for this Goal status: IN PROGRESS  Patient to demonstrate gait speed of at least 1.9 ft/sec in order to reduce falls risk.  Baseline: 1.03 ft/sec Goal status: IN PROGRESS  Patient to report 40% improvement in ease of performing bed mobility.  Baseline: - Goal status: IN PROGRESS  Patient to demonstrate 5xSTS test in <20 sec in order to decrease risk of falls.   Baseline: 39.21 sec from hi-low table on lowest setting and with B UE support Goal status: IN PROGRESS  Patient to verbalize  understanding of fall prevention in home environment information. Baseline: Not yet initiated Goal status: IN PROGRESS    ASSESSMENT:  CLINICAL IMPRESSION: Pt presents today, >15 minutes late for session, but agreeable to stay for shortened PT session.  Skilled PT session focused on assessing Berg Balance test, with score 26/56 indicating increased fall risk.  Worked on sit to energy manager, as pt reports that his L hand hurts from pushing so much; tried to problem-solve with practice at a higher surface, but no elevating mats  available in gym during pt's session to try.  Provided seated leg strengthening exercises to initiate HEP and will continue to work to address posture and balance and transfers in coming visits.    OBJECTIVE IMPAIRMENTS: Abnormal gait, decreased activity tolerance, decreased balance, decreased coordination, decreased knowledge of use of DME, difficulty walking, decreased strength, postural dysfunction, and pain.   ACTIVITY LIMITATIONS: carrying, lifting, bending, sitting, standing, squatting, sleeping, stairs, transfers, bed mobility, bathing, toileting, dressing, reach over head, hygiene/grooming, and locomotion level  PARTICIPATION LIMITATIONS: meal prep, cleaning, laundry, shopping, community activity, and church  PERSONAL FACTORS: Age, Fitness, Time since onset of injury/illness/exacerbation, and 3+ comorbidities: CHF, CAD, HTN, pulmonary HTN, cervical laminectomy and fusion, lumbar laminectomy, knee surgery  are also affecting patient's functional outcome.   REHAB POTENTIAL: Good  CLINICAL DECISION MAKING: Evolving/moderate complexity  EVALUATION COMPLEXITY: Moderate  PLAN:  PT FREQUENCY: 2x/week  PT DURATION: 8 weeks  PLANNED INTERVENTIONS: 97164- PT Re-evaluation, 97110-Therapeutic exercises, 97530- Therapeutic activity, [id number]- Neuromuscular re-education, 97535- Self Care, [id number]- Manual therapy, [id number]- Gait training, [phone]- Canalith  repositioning, [id number]- Aquatic Therapy, [phone] (1-2 muscles), [id number] (3+ muscles)- Dry Needling, Patient/Family education, Balance training, Stair training, Taping, Joint mobilization, Spinal mobilization, Vestibular training, DME instructions, Cryotherapy, and Moist heat  PLAN FOR NEXT SESSION: ?Schedule more appts to match POC.  Edu on tub bench,  review initial HEP and progress for functional mobility and transfers; pt is newly diagnosed and needs edu on PD symptoms and etiology as well as community resources    Greig Anon, [place] 07/18/2024 3:06 PM Phone: [phone] Fax: [phone]  [facility] at [facility] [address] [place], [place] [address] Phone # [phone] Fax # [phone]         "

## 2024-07-20 ENCOUNTER — Ambulatory Visit

## 2024-07-20 DIAGNOSIS — M6281 Muscle weakness (generalized): Secondary | ICD-10-CM

## 2024-07-20 DIAGNOSIS — G20A1 Parkinson's disease without dyskinesia, without mention of fluctuations: Secondary | ICD-10-CM | POA: Diagnosis not present

## 2024-07-20 DIAGNOSIS — R2689 Other abnormalities of gait and mobility: Secondary | ICD-10-CM

## 2024-07-20 NOTE — Therapy (Signed)
 " OUTPATIENT PHYSICAL THERAPY NEURO TREATMENT   Patient Name: Richard Sandoval MRN: [id number] DOB:15-Feb-1946, 78 y.o., male Today's Date: 07/20/2024   PCP: [name], MD  REFERRING PROVIDER: Tat, Asberry RAMAN, DO  END OF SESSION:  PT End of Session - 07/20/24 1616     Visit Number 3    Number of Visits 17    Date for Recertification  09/01/24    Authorization Type Medicare/[employer]    PT Start Time 1615    PT Stop Time 1700    PT Time Calculation (min) 45 min    Activity Tolerance Patient tolerated treatment well    Behavior During Therapy [name] for tasks assessed/performed           Past Medical History:  Diagnosis Date   Acute on chronic combined systolic and diastolic CHF (congestive heart failure) (HCC) 09/06/2021   CAD in native artery 09/06/2021   Chronic combined systolic and diastolic heart failure (HCC) 09/06/2021   DVT (deep venous thrombosis) (HCC)    L leg in 2009   Dyspnea    GERD (gastroesophageal reflux disease)    Hypertension    enlarged heart   Morbid obesity (HCC)    OSA (obstructive sleep apnea)    on CPAP since 1995   Pancreatitis    Pulmonary hypertension (HCC) 06/04/2017   Right heart failure (HCC) 08/27/2017   Past Surgical History:  Procedure Laterality Date   BACK SURGERY  04/09/1999   cervical laminectomy and fusion     CHOLECYSTECTOMY N/A 01/25/2014   Procedure: LAPAROSCOPIC CHOLECYSTECTOMY WITH INTRAOPERATIVE CHOLANGIOGRAM;  Surgeon: Krystal CHRISTELLA Spinner, MD;  Location: [facility];  Service: General;  Laterality: N/A;   COLONOSCOPY     COLONOSCOPY WITH PROPOFOL  N/A 03/04/2019   Procedure: COLONOSCOPY WITH PROPOFOL ;  Surgeon: Dianna Specking, MD;  Location: [facility];  Service: Endoscopy;  Laterality: N/A;   CORONARY PRESSURE/FFR STUDY N/A 09/11/2021   Procedure: INTRAVASCULAR PRESSURE WIRE/FFR STUDY;  Surgeon: Wendel Lurena POUR, MD;  Location: [facility];  Service: Cardiovascular;  Laterality: N/A;   EUS N/A 01/24/2014   Procedure: UPPER  ENDOSCOPIC ULTRASOUND (EUS) RADIAL;  Surgeon: Elsie Cree, MD;  Location: [facility];  Service: Endoscopy;  Laterality: N/A;   KNEE SURGERY  1998   left inguinal hernia     LUMBAR LAMINECTOMY     RIGHT/LEFT HEART CATH AND CORONARY ANGIOGRAPHY N/A 09/11/2021   Procedure: RIGHT/LEFT HEART CATH AND CORONARY ANGIOGRAPHY;  Surgeon: Wendel Lurena POUR, MD;  Location: [facility];  Service: Cardiovascular;  Laterality: N/A;   TRIGGER FINGER RELEASE  08/04/2012   Procedure: MINOR RELEASE TRIGGER FINGER/A-1 PULLEY;  Surgeon: Arley JONELLE Curia, MD;  Location: [facility];  Service: Orthopedics;  Laterality: Right;   Patient Active Problem List   Diagnosis Date Noted   Chronic combined systolic and diastolic heart failure (HCC) 09/06/2021   CAD in native artery 09/06/2021   History of colonic polyps 03/04/2019   Lumbar stenosis with neurogenic claudication 12/07/2017   Right heart failure (HCC) 08/27/2017   Pulmonary hypertension (HCC) 06/04/2017   GERD (gastroesophageal reflux disease) 02/21/2016   Bilateral pulmonary embolism (HCC) 02/20/2016   Hypokalemia 01/25/2014   Pancreatitis, gallstone 01/25/2014   Morbid obesity (HCC)    Pancreatitis 01/05/2014   Abnormal transaminases 01/05/2014   Dyspnea 12/15/2013   Upper airway cough syndrome 09/27/2013   Cellulitis and abscess 05/10/2012   Morbid (severe) obesity due to excess calories (HCC) 09/24/2006   DEPRESSIVE DISORDER, NOS 09/24/2006  BACK PAIN, LOW 09/24/2006   OSA (obstructive sleep apnea) 09/24/2006    ONSET DATE: pt diagnosed 3 months ago   REFERRING DIAG: G20.A1 (ICD-10-CM) - Parkinson's disease without dyskinesia or fluctuating manifestations (HCC)  THERAPY DIAG:  Other abnormalities of gait and mobility  Muscle weakness (generalized)  Parkinson's disease without dyskinesia or fluctuating manifestations (HCC)  Rationale for Evaluation and Treatment: Rehabilitation  SUBJECTIVE:                                                                                                                                                                                              SUBJECTIVE STATEMENT:  Pt accompanied by: self  PERTINENT HISTORY: CHF, CAD, HTN, pulmonary HTN, cervical laminectomy and fusion, lumbar laminectomy, knee surgery  ED imaging 05/27/24 showed R hand/wrist fx.  Per 06/14/24 Dr. Reina note Today the findings were reviewed with the patient after program in detail we talked about the reason the rationale for weaning out of the brace. Gradual use and activity of the right hand. The patient was reassured of the findings of the left hand.  PAIN:  Are you having pain? Yes: NPRS scale: 8/10 Pain location: R shoulder  Pain description: old age pain Aggravating factors: falling on it Relieving factors: heating pad  PRECAUTIONS: Fall  RED FLAGS: None   WEIGHT BEARING RESTRICTIONS: Patient reports that he was not given any precautions for the R hand. Pt is wearing R wrist lace up brace today  FALLS: Has patient fallen in last 6 months? Yes. Number of falls 1  LIVING ENVIRONMENT: Lives with: lives with their spouse and lives with their daughter Lives in: House/apartment Stairs: 1 step to enter Has following equipment at home: Counselling psychologist, Environmental Consultant - 2 wheeled, and hospital bed  PLOF: Independent with basic ADLs, Independent with household mobility with device, and Vocation/Vocational requirements: retired  PATIENT GOALS: improve ability to get out of bed and get into tub   OBJECTIVE:    TODAY'S TREATMENT: 07/20/24 Activity Comments  Supine PWR moves -demonstration video -trials of supine PWR: Up, Rock, Twist, Step                      TODAY'S TREATMENT: 07/18/24 Activity Comments  Berg Balance test:  26/56 Scores <45/56 indicate increased fall risk  Seated LAQ x 5 reps Seated march x 5 reps Attempted seated forward lean to upright posture, x 3 reps (pt  doesn't like the discomfort in his neck)   Sit to stand transfer technique practice At edge of 20 mat-cues for scooting forward, foot placement, increased forward lean and boost  forward and up with hands (as opposed to pushing down through hands)                 Access Code: [email or web address] URL: [email or web address] Date: 07/18/2024 Prepared by: [facility] - [facility]  Rehab - [facility]  Exercises - Seated Long Arc Quad  - 1 x daily - 7 x weekly - 3 sets - 10 reps - Seated March  - 1 x daily - 7 x weekly - 3 sets - 10 reps   PATIENT EDUCATION: Education details: Hospital doctor results, transfer technique, HEP initiated Person educated: Patient Education method: Explanation, Demonstration, Verbal cues, and Handouts Education comprehension: verbalized understanding, returned demonstration, verbal cues required, and needs further education  --------------------------------------------------- Note: Objective measures were completed at Evaluation unless otherwise noted.   *pt had difficulty standing up from waiting room chair. Eval was performed with pt sitting on elevated mat.   DIAGNOSTIC FINDINGS:  05/27/24 hand xray: Acute mildly displaced third metacarpal shaft fracture. Possible tiny ulnar styloid process fracture.   05/27/24 wrist xray:Possible tiny fracture at the tip of ulnar styloid process. See separately dictated hand radiographs   COGNITION: Overall cognitive status: Within functional limits for tasks assessed   SENSATION: Pt denies N/T in UEs/LEs  COORDINATION: Alternating pronation/supination: slowed B Alternating toe tap: WNL Finger to nose: slowed on L   MUSCLE TONE: WFL B LEs    POSTURE: rounded shoulders and forward head, flexed trunk  LOWER EXTREMITY ROM:     Active  Right Eval Left Eval  Hip flexion    Hip extension    Hip abduction    Hip adduction    Hip internal rotation    Hip external rotation    Knee  flexion    Knee extension    Ankle dorsiflexion 9 11  Ankle plantarflexion    Ankle inversion    Ankle eversion     (Blank rows = not tested)  LOWER EXTREMITY MMT:    MMT Right Eval Left Eval  Hip flexion 5 5  Hip extension    Hip abduction 5 5  Hip adduction 4+ 4+  Hip internal rotation    Hip external rotation    Knee flexion 4+ 4+  Knee extension 4+ 5  Ankle dorsiflexion 4+ 4+  Ankle plantarflexion    Ankle inversion    Ankle eversion    (Blank rows = not tested)  GAIT: Findings: Assistive device utilized:Quad cane small base, Level of assistance: SBA, and Comments: step-to/step through with short B step length and flexed trunk   FUNCTIONAL TESTS:  5xSTS from hi-low table on lowest setting and with B UE support: 39.21 sec  75M walk: 31.98 sec with quad cane (1.03 ft/sec)                                                                                                                             TREATMENT DATE: 07/07/24    PATIENT EDUCATION:  Education details: prognosis, POC, edu on exam findings and how they relate to functional impairments; answered pt's questions on how to modify. Educated on pt's low voice volume and how ST could help with that Person educated: Patient Education method: Explanation Education comprehension: verbalized understanding  HOME EXERCISE PROGRAM: Not yet initiated   GOALS: Goals reviewed with patient? Yes  SHORT TERM GOALS: Target date: 08/04/2024  Patient to be independent with initial HEP. Baseline: HEP initiated Goal status: IN PROGRESS    LONG TERM GOALS: Target date: 09/01/2024  Patient to be independent with advanced HEP. Baseline: Not yet initiated  Goal status: IN PROGRESS  Patient to verbalize understanding of local Parkinson's Disease community resources including community fitness post D/C.   Baseline: Not yet initiated  Goal status: IN PROGRESS  Patient to improve Berg score to at least 45/56 to decrease risk of  falls.  Baseline:  26/56 Goal status: IN PROGRESS  Patient to obtain tub bench to improve safety/confidence with bathing.  Baseline: does not have adaptive equipment for this Goal status: IN PROGRESS  Patient to demonstrate gait speed of at least 1.9 ft/sec in order to reduce falls risk.  Baseline: 1.03 ft/sec Goal status: IN PROGRESS  Patient to report 40% improvement in ease of performing bed mobility.  Baseline: - Goal status: IN PROGRESS  Patient to demonstrate 5xSTS test in <20 sec in order to decrease risk of falls.   Baseline: 39.21 sec from hi-low table on lowest setting and with B UE support Goal status: IN PROGRESS  Patient to verbalize understanding of fall prevention in home environment information. Baseline: Not yet initiated Goal status: IN PROGRESS    ASSESSMENT:  CLINICAL IMPRESSION: Instructed in supine large amplitude movements to improve coordination, power, and flexibility for bed mobility tasks.  Initially requiring significant hand-over-hand guidance for sequence with improved carryover and then teach-back by end of session. Reinforced rationale and techniques as pt initially reports difficulty repositioning independently in bed and with use of large amplitude able to achieve sidelying and spuine to sit without assits. Tolerated activities well and reports decreased stiffness by end of session.  OBJECTIVE IMPAIRMENTS: Abnormal gait, decreased activity tolerance, decreased balance, decreased coordination, decreased knowledge of use of DME, difficulty walking, decreased strength, postural dysfunction, and pain.   ACTIVITY LIMITATIONS: carrying, lifting, bending, sitting, standing, squatting, sleeping, stairs, transfers, bed mobility, bathing, toileting, dressing, reach over head, hygiene/grooming, and locomotion level  PARTICIPATION LIMITATIONS: meal prep, cleaning, laundry, shopping, community activity, and church  PERSONAL FACTORS: Age, Fitness, Time since  onset of injury/illness/exacerbation, and 3+ comorbidities: CHF, CAD, HTN, pulmonary HTN, cervical laminectomy and fusion, lumbar laminectomy, knee surgery  are also affecting patient's functional outcome.   REHAB POTENTIAL: Good  CLINICAL DECISION MAKING: Evolving/moderate complexity  EVALUATION COMPLEXITY: Moderate  PLAN:  PT FREQUENCY: 2x/week  PT DURATION: 8 weeks  PLANNED INTERVENTIONS: [id number]- PT Re-evaluation, 97110-Therapeutic exercises, 97530- Therapeutic activity, [id number]- Neuromuscular re-education, 97535- Self Care, [id number]- Manual therapy, [id number]- Gait training, [phone]- Canalith repositioning, [id number]- Aquatic Therapy, [phone] (1-2 muscles), [id number] (3+ muscles)- Dry Needling, Patient/Family education, Balance training, Stair training, Taping, Joint mobilization, Spinal mobilization, Vestibular training, DME instructions, Cryotherapy, and Moist heat  PLAN FOR NEXT SESSION: ?Schedule more appts to match POC.  Edu on tub bench,  review initial HEP and progress for functional mobility and transfers; pt is newly diagnosed and needs edu on PD symptoms and etiology as well as community resources    5:11 PM, 07/20/2024 M. Kelly Eliora Nienhuis, PT, DPT Physical Therapist-  [place] Office Number: [phone]          "

## 2024-07-22 NOTE — Therapy (Incomplete)
 " OUTPATIENT PHYSICAL THERAPY NEURO TREATMENT   Patient Name: Richard Sandoval MRN: [id number] DOB:February 26, 1946, 78 y.o., male Today's Date: 07/22/2024   PCP: [name], MD  REFERRING PROVIDER: Tat, Asberry RAMAN, DO  END OF SESSION:     Past Medical History:  Diagnosis Date   Acute on chronic combined systolic and diastolic CHF (congestive heart failure) (HCC) 09/06/2021   CAD in native artery 09/06/2021   Chronic combined systolic and diastolic heart failure (HCC) 09/06/2021   DVT (deep venous thrombosis) (HCC)    L leg in 2009   Dyspnea    GERD (gastroesophageal reflux disease)    Hypertension    enlarged heart   Morbid obesity (HCC)    OSA (obstructive sleep apnea)    on CPAP since 1995   Pancreatitis    Pulmonary hypertension (HCC) 06/04/2017   Right heart failure (HCC) 08/27/2017   Past Surgical History:  Procedure Laterality Date   BACK SURGERY  04/09/1999   cervical laminectomy and fusion     CHOLECYSTECTOMY N/A 01/25/2014   Procedure: LAPAROSCOPIC CHOLECYSTECTOMY WITH INTRAOPERATIVE CHOLANGIOGRAM;  Surgeon: Krystal CHRISTELLA Spinner, MD;  Location: [facility];  Service: General;  Laterality: N/A;   COLONOSCOPY     COLONOSCOPY WITH PROPOFOL  N/A 03/04/2019   Procedure: COLONOSCOPY WITH PROPOFOL ;  Surgeon: Dianna Specking, MD;  Location: [facility];  Service: Endoscopy;  Laterality: N/A;   CORONARY PRESSURE/FFR STUDY N/A 09/11/2021   Procedure: INTRAVASCULAR PRESSURE WIRE/FFR STUDY;  Surgeon: Wendel Lurena POUR, MD;  Location: [facility];  Service: Cardiovascular;  Laterality: N/A;   EUS N/A 01/24/2014   Procedure: UPPER ENDOSCOPIC ULTRASOUND (EUS) RADIAL;  Surgeon: Elsie Cree, MD;  Location: [facility];  Service: Endoscopy;  Laterality: N/A;   KNEE SURGERY  1998   left inguinal hernia     LUMBAR LAMINECTOMY     RIGHT/LEFT HEART CATH AND CORONARY ANGIOGRAPHY N/A 09/11/2021   Procedure: RIGHT/LEFT HEART CATH AND CORONARY ANGIOGRAPHY;  Surgeon: Wendel Lurena POUR, MD;  Location: [facility];  Service: Cardiovascular;  Laterality: N/A;   TRIGGER FINGER RELEASE  08/04/2012   Procedure: MINOR RELEASE TRIGGER FINGER/A-1 PULLEY;  Surgeon: Arley JONELLE Curia, MD;  Location: [facility];  Service: Orthopedics;  Laterality: Right;   Patient Active Problem List   Diagnosis Date Noted   Chronic combined systolic and diastolic heart failure (HCC) 09/06/2021   CAD in native artery 09/06/2021   History of colonic polyps 03/04/2019   Lumbar stenosis with neurogenic claudication 12/07/2017   Right heart failure (HCC) 08/27/2017   Pulmonary hypertension (HCC) 06/04/2017   GERD (gastroesophageal reflux disease) 02/21/2016   Bilateral pulmonary embolism (HCC) 02/20/2016   Hypokalemia 01/25/2014   Pancreatitis, gallstone 01/25/2014   Morbid obesity (HCC)    Pancreatitis 01/05/2014   Abnormal transaminases 01/05/2014   Dyspnea 12/15/2013   Upper airway cough syndrome 09/27/2013   Cellulitis and abscess 05/10/2012   Morbid (severe) obesity due to excess calories (HCC) 09/24/2006   DEPRESSIVE DISORDER, NOS 09/24/2006   BACK PAIN, LOW 09/24/2006   OSA (obstructive sleep apnea) 09/24/2006    ONSET DATE: pt diagnosed 3 months ago   REFERRING DIAG: G20.A1 (ICD-10-CM) - Parkinson's disease without dyskinesia or fluctuating manifestations (HCC)  THERAPY DIAG:  No diagnosis found.  Rationale for Evaluation and Treatment: Rehabilitation  SUBJECTIVE:  SUBJECTIVE STATEMENT:  Pt accompanied by: self  PERTINENT HISTORY: CHF, CAD, HTN, pulmonary HTN, cervical laminectomy and fusion, lumbar laminectomy, knee surgery  ED imaging 05/27/24 showed R hand/wrist fx.  Per 06/14/24 Dr. Reina note Today the findings were reviewed with the patient after program in detail we talked about the  reason the rationale for weaning out of the brace. Gradual use and activity of the right hand. The patient was reassured of the findings of the left hand.  PAIN:  Are you having pain? Yes: NPRS scale: 8/10 Pain location: R shoulder  Pain description: old age pain Aggravating factors: falling on it Relieving factors: heating pad  PRECAUTIONS: Fall  RED FLAGS: None   WEIGHT BEARING RESTRICTIONS: Patient reports that he was not given any precautions for the R hand. Pt is wearing R wrist lace up brace today  FALLS: Has patient fallen in last 6 months? Yes. Number of falls 1  LIVING ENVIRONMENT: Lives with: lives with their spouse and lives with their daughter Lives in: House/apartment Stairs: 1 step to enter Has following equipment at home: Counselling psychologist, Environmental Consultant - 2 wheeled, and hospital bed  PLOF: Independent with basic ADLs, Independent with household mobility with device, and Vocation/Vocational requirements: retired  PATIENT GOALS: improve ability to get out of bed and get into tub   OBJECTIVE:     TODAY'S TREATMENT: 07/25/24 Activity Comments                        TODAY'S TREATMENT: 07/20/24 Activity Comments  Supine PWR moves -demonstration video -trials of supine PWR: Up, Rock, Twist, Step                      TODAY'S TREATMENT: 07/18/24 Activity Comments  Berg Balance test:  26/56 Scores <45/56 indicate increased fall risk  Seated LAQ x 5 reps Seated march x 5 reps Attempted seated forward lean to upright posture, x 3 reps (pt doesn't like the discomfort in his neck)   Sit to stand transfer technique practice At edge of 20 mat-cues for scooting forward, foot placement, increased forward lean and boost forward and up with hands (as opposed to pushing down through hands)                 Access Code: [email or web address] URL: [email or web address] Date: 07/18/2024 Prepared by: [facility] - Outpatient  Re[facility] - [facility]  Exercises - Seated Long Arc Quad  - 1 x daily - 7 x weekly - 3 sets - 10 reps - Seated March  - 1 x daily - 7 x weekly - 3 sets - 10 reps   PATIENT EDUCATION: Education details: Hospital doctor results, transfer technique, HEP initiated Person educated: Patient Education method: Explanation, Demonstration, Verbal cues, and Handouts Education comprehension: verbalized understanding, returned demonstration, verbal cues required, and needs further education  --------------------------------------------------- Note: Objective measures were completed at Evaluation unless otherwise noted.   *pt had difficulty standing up from waiting room chair. Eval was performed with pt sitting on elevated mat.   DIAGNOSTIC FINDINGS:  05/27/24 hand xray: Acute mildly displaced third metacarpal shaft fracture. Possible tiny ulnar styloid process fracture.   05/27/24 wrist xray:Possible tiny fracture at the tip of ulnar styloid process. See separately dictated hand radiographs   COGNITION: Overall cognitive status: Within functional limits for tasks assessed   SENSATION: Pt denies N/T in UEs/LEs  COORDINATION: Alternating pronation/supination: slowed B Alternating toe tap: WNL Finger  to nose: slowed on L   MUSCLE TONE: WFL B LEs    POSTURE: rounded shoulders and forward head, flexed trunk  LOWER EXTREMITY ROM:     Active  Right Eval Left Eval  Hip flexion    Hip extension    Hip abduction    Hip adduction    Hip internal rotation    Hip external rotation    Knee flexion    Knee extension    Ankle dorsiflexion 9 11  Ankle plantarflexion    Ankle inversion    Ankle eversion     (Blank rows = not tested)  LOWER EXTREMITY MMT:    MMT Right Eval Left Eval  Hip flexion 5 5  Hip extension    Hip abduction 5 5  Hip adduction 4+ 4+  Hip internal rotation    Hip external rotation    Knee flexion 4+ 4+  Knee extension 4+ 5  Ankle dorsiflexion 4+ 4+  Ankle  plantarflexion    Ankle inversion    Ankle eversion    (Blank rows = not tested)  GAIT: Findings: Assistive device utilized:Quad cane small base, Level of assistance: SBA, and Comments: step-to/step through with short B step length and flexed trunk   FUNCTIONAL TESTS:  5xSTS from hi-low table on lowest setting and with B UE support: 39.21 sec  52M walk: 31.98 sec with quad cane (1.03 ft/sec)                                                                                                                             TREATMENT DATE: 07/07/24    PATIENT EDUCATION: Education details: prognosis, POC, edu on exam findings and how they relate to functional impairments; answered pt's questions on how to modify. Educated on pt's low voice volume and how ST could help with that Person educated: Patient Education method: Explanation Education comprehension: verbalized understanding  HOME EXERCISE PROGRAM: Not yet initiated   GOALS: Goals reviewed with patient? Yes  SHORT TERM GOALS: Target date: 08/04/2024  Patient to be independent with initial HEP. Baseline: HEP initiated Goal status: IN PROGRESS    LONG TERM GOALS: Target date: 09/01/2024  Patient to be independent with advanced HEP. Baseline: Not yet initiated  Goal status: IN PROGRESS  Patient to verbalize understanding of local Parkinson's Disease community resources including community fitness post D/C.   Baseline: Not yet initiated  Goal status: IN PROGRESS  Patient to improve Berg score to at least 45/56 to decrease risk of falls.  Baseline:  26/56 Goal status: IN PROGRESS  Patient to obtain tub bench to improve safety/confidence with bathing.  Baseline: does not have adaptive equipment for this Goal status: IN PROGRESS  Patient to demonstrate gait speed of at least 1.9 ft/sec in order to reduce falls risk.  Baseline: 1.03 ft/sec Goal status: IN PROGRESS  Patient to report 40% improvement in ease of performing bed  mobility.  Baseline: - Goal status: IN PROGRESS  Patient to demonstrate 5xSTS test in <20 sec in order to decrease risk of falls.   Baseline: 39.21 sec from hi-low table on lowest setting and with B UE support Goal status: IN PROGRESS  Patient to verbalize understanding of fall prevention in home environment information. Baseline: Not yet initiated Goal status: IN PROGRESS    ASSESSMENT:  CLINICAL IMPRESSION: Instructed in supine large amplitude movements to improve coordination, power, and flexibility for bed mobility tasks.  Initially requiring significant hand-over-hand guidance for sequence with improved carryover and then teach-back by end of session. Reinforced rationale and techniques as pt initially reports difficulty repositioning independently in bed and with use of large amplitude able to achieve sidelying and spuine to sit without assits. Tolerated activities well and reports decreased stiffness by end of session.  OBJECTIVE IMPAIRMENTS: Abnormal gait, decreased activity tolerance, decreased balance, decreased coordination, decreased knowledge of use of DME, difficulty walking, decreased strength, postural dysfunction, and pain.   ACTIVITY LIMITATIONS: carrying, lifting, bending, sitting, standing, squatting, sleeping, stairs, transfers, bed mobility, bathing, toileting, dressing, reach over head, hygiene/grooming, and locomotion level  PARTICIPATION LIMITATIONS: meal prep, cleaning, laundry, shopping, community activity, and church  PERSONAL FACTORS: Age, Fitness, Time since onset of injury/illness/exacerbation, and 3+ comorbidities: CHF, CAD, HTN, pulmonary HTN, cervical laminectomy and fusion, lumbar laminectomy, knee surgery  are also affecting patient's functional outcome.   REHAB POTENTIAL: Good  CLINICAL DECISION MAKING: Evolving/moderate complexity  EVALUATION COMPLEXITY: Moderate  PLAN:  PT FREQUENCY: 2x/week  PT DURATION: 8 weeks  PLANNED INTERVENTIONS:  [id number]- PT Re-evaluation, 97110-Therapeutic exercises, 97530- Therapeutic activity, [id number]- Neuromuscular re-education, 97535- Self Care, [id number]- Manual therapy, [id number]- Gait training, [id number]- Canalith repositioning, [id number]- Aquatic Therapy, [phone] (1-2 muscles), [id number] (3+ muscles)- Dry Needling, Patient/Family education, Balance training, Stair training, Taping, Joint mobilization, Spinal mobilization, Vestibular training, DME instructions, Cryotherapy, and Moist heat  PLAN FOR NEXT SESSION: ?Schedule more appts to match POC.  Edu on tub bench,  review initial HEP and progress for functional mobility and transfers; pt is newly diagnosed and needs edu on PD symptoms and etiology as well as community resources           "

## 2024-07-25 ENCOUNTER — Ambulatory Visit: Admitting: Physical Therapy

## 2024-07-26 ENCOUNTER — Ambulatory Visit: Payer: Self-pay | Admitting: Cardiovascular Disease

## 2024-07-27 ENCOUNTER — Ambulatory Visit: Admitting: Physical Therapy

## 2024-07-27 NOTE — Therapy (Incomplete)
 " OUTPATIENT PHYSICAL THERAPY NEURO TREATMENT   Patient Name: Richard Sandoval MRN: [id number] DOB:May 12, 1946, 78 y.o., male Today's Date: 07/27/2024   PCP: [name], MD  REFERRING PROVIDER: Tat, Asberry RAMAN, DO  END OF SESSION:     Past Medical History:  Diagnosis Date   Acute on chronic combined systolic and diastolic CHF (congestive heart failure) (HCC) 09/06/2021   CAD in native artery 09/06/2021   Chronic combined systolic and diastolic heart failure (HCC) 09/06/2021   DVT (deep venous thrombosis) (HCC)    L leg in 2009   Dyspnea    GERD (gastroesophageal reflux disease)    Hypertension    enlarged heart   Morbid obesity (HCC)    OSA (obstructive sleep apnea)    on CPAP since 1995   Pancreatitis    Pulmonary hypertension (HCC) 06/04/2017   Right heart failure (HCC) 08/27/2017   Past Surgical History:  Procedure Laterality Date   BACK SURGERY  04/09/1999   cervical laminectomy and fusion     CHOLECYSTECTOMY N/A 01/25/2014   Procedure: LAPAROSCOPIC CHOLECYSTECTOMY WITH INTRAOPERATIVE CHOLANGIOGRAM;  Surgeon: Krystal CHRISTELLA Spinner, MD;  Location: [facility];  Service: General;  Laterality: N/A;   COLONOSCOPY     COLONOSCOPY WITH PROPOFOL  N/A 03/04/2019   Procedure: COLONOSCOPY WITH PROPOFOL ;  Surgeon: Dianna Specking, MD;  Location: [facility];  Service: Endoscopy;  Laterality: N/A;   CORONARY PRESSURE/FFR STUDY N/A 09/11/2021   Procedure: INTRAVASCULAR PRESSURE WIRE/FFR STUDY;  Surgeon: Wendel Lurena POUR, MD;  Location: [facility];  Service: Cardiovascular;  Laterality: N/A;   EUS N/A 01/24/2014   Procedure: UPPER ENDOSCOPIC ULTRASOUND (EUS) RADIAL;  Surgeon: Elsie Cree, MD;  Location: [facility];  Service: Endoscopy;  Laterality: N/A;   KNEE SURGERY  1998   left inguinal hernia     LUMBAR LAMINECTOMY     RIGHT/LEFT HEART CATH AND CORONARY ANGIOGRAPHY N/A 09/11/2021   Procedure: RIGHT/LEFT HEART CATH AND CORONARY ANGIOGRAPHY;  Surgeon: Wendel Lurena POUR, MD;  Location: [facility];  Service: Cardiovascular;  Laterality: N/A;   TRIGGER FINGER RELEASE  08/04/2012   Procedure: MINOR RELEASE TRIGGER FINGER/A-1 PULLEY;  Surgeon: Arley JONELLE Curia, MD;  Location: [facility];  Service: Orthopedics;  Laterality: Right;   Patient Active Problem List   Diagnosis Date Noted   Chronic combined systolic and diastolic heart failure (HCC) 09/06/2021   CAD in native artery 09/06/2021   History of colonic polyps 03/04/2019   Lumbar stenosis with neurogenic claudication 12/07/2017   Right heart failure (HCC) 08/27/2017   Pulmonary hypertension (HCC) 06/04/2017   GERD (gastroesophageal reflux disease) 02/21/2016   Bilateral pulmonary embolism (HCC) 02/20/2016   Hypokalemia 01/25/2014   Pancreatitis, gallstone 01/25/2014   Morbid obesity (HCC)    Pancreatitis 01/05/2014   Abnormal transaminases 01/05/2014   Dyspnea 12/15/2013   Upper airway cough syndrome 09/27/2013   Cellulitis and abscess 05/10/2012   Morbid (severe) obesity due to excess calories (HCC) 09/24/2006   DEPRESSIVE DISORDER, NOS 09/24/2006   BACK PAIN, LOW 09/24/2006   OSA (obstructive sleep apnea) 09/24/2006    ONSET DATE: pt diagnosed 3 months ago   REFERRING DIAG: G20.A1 (ICD-10-CM) - Parkinson's disease without dyskinesia or fluctuating manifestations (HCC)  THERAPY DIAG:  No diagnosis found.  Rationale for Evaluation and Treatment: Rehabilitation  SUBJECTIVE:  SUBJECTIVE STATEMENT: *** Pt accompanied by: self  PERTINENT HISTORY: CHF, CAD, HTN, pulmonary HTN, cervical laminectomy and fusion, lumbar laminectomy, knee surgery  ED imaging 05/27/24 showed R hand/wrist fx.  Per 06/14/24 Dr. Reina note Today the findings were reviewed with the patient after program in detail we talked about the  reason the rationale for weaning out of the brace. Gradual use and activity of the right hand. The patient was reassured of the findings of the left hand.  PAIN:  Are you having pain? Yes: NPRS scale: 8/10 Pain location: R shoulder  Pain description: old age pain Aggravating factors: falling on it Relieving factors: heating pad  PRECAUTIONS: Fall  RED FLAGS: None   WEIGHT BEARING RESTRICTIONS: Patient reports that he was not given any precautions for the R hand. Pt is wearing R wrist lace up brace today  FALLS: Has patient fallen in last 6 months? Yes. Number of falls 1  LIVING ENVIRONMENT: Lives with: lives with their spouse and lives with their daughter Lives in: House/apartment Stairs: 1 step to enter Has following equipment at home: Counselling psychologist, Environmental Consultant - 2 wheeled, and hospital bed  PLOF: Independent with basic ADLs, Independent with household mobility with device, and Vocation/Vocational requirements: retired  PATIENT GOALS: improve ability to get out of bed and get into tub   OBJECTIVE:     TODAY'S TREATMENT: 07/27/24 Activity Comments                        TODAY'S TREATMENT: 07/20/24 Activity Comments  Supine PWR moves -demonstration video -trials of supine PWR: Up, Rock, Twist, Step                      TODAY'S TREATMENT: 07/18/24 Activity Comments  Berg Balance test:  26/56 Scores <45/56 indicate increased fall risk  Seated LAQ x 5 reps Seated march x 5 reps Attempted seated forward lean to upright posture, x 3 reps (pt doesn't like the discomfort in his neck)   Sit to stand transfer technique practice At edge of 20 mat-cues for scooting forward, foot placement, increased forward lean and boost forward and up with hands (as opposed to pushing down through hands)                 Access Code: [email or web address] URL: [email or web address] Date: 07/18/2024 Prepared by: [facility] - [facility]  Exercises - Seated Long Arc Quad  - 1 x daily - 7 x weekly - 3 sets - 10 reps - Seated March  - 1 x daily - 7 x weekly - 3 sets - 10 reps   PATIENT EDUCATION: Education details: Hospital doctor results, transfer technique, HEP initiated Person educated: Patient Education method: Explanation, Demonstration, Verbal cues, and Handouts Education comprehension: verbalized understanding, returned demonstration, verbal cues required, and needs further education  --------------------------------------------------- Note: Objective measures were completed at Evaluation unless otherwise noted.   *pt had difficulty standing up from waiting room chair. Eval was performed with pt sitting on elevated mat.   DIAGNOSTIC FINDINGS:  05/27/24 hand xray: Acute mildly displaced third metacarpal shaft fracture. Possible tiny ulnar styloid process fracture.   05/27/24 wrist xray:Possible tiny fracture at the tip of ulnar styloid process. See separately dictated hand radiographs   COGNITION: Overall cognitive status: Within functional limits for tasks assessed   SENSATION: Pt denies N/T in UEs/LEs  COORDINATION: Alternating pronation/supination: slowed B Alternating toe tap: WNL Finger  to nose: slowed on L   MUSCLE TONE: WFL B LEs    POSTURE: rounded shoulders and forward head, flexed trunk  LOWER EXTREMITY ROM:     Active  Right Eval Left Eval  Hip flexion    Hip extension    Hip abduction    Hip adduction    Hip internal rotation    Hip external rotation    Knee flexion    Knee extension    Ankle dorsiflexion 9 11  Ankle plantarflexion    Ankle inversion    Ankle eversion     (Blank rows = not tested)  LOWER EXTREMITY MMT:    MMT Right Eval Left Eval  Hip flexion 5 5  Hip extension    Hip abduction 5 5  Hip adduction 4+ 4+  Hip internal rotation    Hip external rotation    Knee flexion 4+ 4+  Knee extension 4+ 5  Ankle dorsiflexion 4+ 4+  Ankle  plantarflexion    Ankle inversion    Ankle eversion    (Blank rows = not tested)  GAIT: Findings: Assistive device utilized:Quad cane small base, Level of assistance: SBA, and Comments: step-to/step through with short B step length and flexed trunk   FUNCTIONAL TESTS:  5xSTS from hi-low table on lowest setting and with B UE support: 39.21 sec  46M walk: 31.98 sec with quad cane (1.03 ft/sec)                                                                                                                             TREATMENT DATE: 07/07/24    PATIENT EDUCATION: Education details: prognosis, POC, edu on exam findings and how they relate to functional impairments; answered pt's questions on how to modify. Educated on pt's low voice volume and how ST could help with that Person educated: Patient Education method: Explanation Education comprehension: verbalized understanding  HOME EXERCISE PROGRAM: Not yet initiated   GOALS: Goals reviewed with patient? Yes  SHORT TERM GOALS: Target date: 08/04/2024  Patient to be independent with initial HEP. Baseline: HEP initiated Goal status: IN PROGRESS    LONG TERM GOALS: Target date: 09/01/2024  Patient to be independent with advanced HEP. Baseline: Not yet initiated  Goal status: IN PROGRESS  Patient to verbalize understanding of local Parkinson's Disease community resources including community fitness post D/C.   Baseline: Not yet initiated  Goal status: IN PROGRESS  Patient to improve Berg score to at least 45/56 to decrease risk of falls.  Baseline:  26/56 Goal status: IN PROGRESS  Patient to obtain tub bench to improve safety/confidence with bathing.  Baseline: does not have adaptive equipment for this Goal status: IN PROGRESS  Patient to demonstrate gait speed of at least 1.9 ft/sec in order to reduce falls risk.  Baseline: 1.03 ft/sec Goal status: IN PROGRESS  Patient to report 40% improvement in ease of performing bed  mobility.  Baseline: - Goal status: IN PROGRESS  Patient to demonstrate 5xSTS test in <20 sec in order to decrease risk of falls.   Baseline: 39.21 sec from hi-low table on lowest setting and with B UE support Goal status: IN PROGRESS  Patient to verbalize understanding of fall prevention in home environment information. Baseline: Not yet initiated Goal status: IN PROGRESS    ASSESSMENT:  CLINICAL IMPRESSION: Pt presents today ***. Skilled PT session focused on ***. Pt needs ***. Pt will continue to benefit from skilled PT towards goals for improved functional mobility and decreased fall risk.  Instructed in supine large amplitude movements to improve coordination, power, and flexibility for bed mobility tasks.  Initially requiring significant hand-over-hand guidance for sequence with improved carryover and then teach-back by end of session. Reinforced rationale and techniques as pt initially reports difficulty repositioning independently in bed and with use of large amplitude able to achieve sidelying and spuine to sit without assits. Tolerated activities well and reports decreased stiffness by end of session.  OBJECTIVE IMPAIRMENTS: Abnormal gait, decreased activity tolerance, decreased balance, decreased coordination, decreased knowledge of use of DME, difficulty walking, decreased strength, postural dysfunction, and pain.   ACTIVITY LIMITATIONS: carrying, lifting, bending, sitting, standing, squatting, sleeping, stairs, transfers, bed mobility, bathing, toileting, dressing, reach over head, hygiene/grooming, and locomotion level  PARTICIPATION LIMITATIONS: meal prep, cleaning, laundry, shopping, community activity, and church  PERSONAL FACTORS: Age, Fitness, Time since onset of injury/illness/exacerbation, and 3+ comorbidities: CHF, CAD, HTN, pulmonary HTN, cervical laminectomy and fusion, lumbar laminectomy, knee surgery  are also affecting patient's functional outcome.   REHAB  POTENTIAL: Good  CLINICAL DECISION MAKING: Evolving/moderate complexity  EVALUATION COMPLEXITY: Moderate  PLAN:  PT FREQUENCY: 2x/week  PT DURATION: 8 weeks  PLANNED INTERVENTIONS: 97164- PT Re-evaluation, 97110-Therapeutic exercises, 97530- Therapeutic activity, [id number]- Neuromuscular re-education, 97535- Self Care, [id number]- Manual therapy, [id number]- Gait training, [phone]- Canalith repositioning, [id number]- Aquatic Therapy, [phone] (1-2 muscles), [id number] (3+ muscles)- Dry Needling, Patient/Family education, Balance training, Stair training, Taping, Joint mobilization, Spinal mobilization, Vestibular training, DME instructions, Cryotherapy, and Moist heat  PLAN FOR NEXT SESSION: ***?Schedule more appts to match POC.  Edu on tub bench,  review initial HEP and progress for functional mobility and transfers; pt is newly diagnosed and needs edu on PD symptoms and etiology as well as community resources     Greig Anon, [place] 07/27/2024 1:06 PM Phone: [phone] Fax: [phone]   [facility] at [facility] [address] [place], [place] [address] Phone # [phone] Fax # [phone]       "

## 2024-08-03 ENCOUNTER — Ambulatory Visit: Attending: Neurology

## 2024-08-03 DIAGNOSIS — R2689 Other abnormalities of gait and mobility: Secondary | ICD-10-CM | POA: Insufficient documentation

## 2024-08-03 DIAGNOSIS — M6281 Muscle weakness (generalized): Secondary | ICD-10-CM | POA: Insufficient documentation

## 2024-08-03 DIAGNOSIS — G20A1 Parkinson's disease without dyskinesia, without mention of fluctuations: Secondary | ICD-10-CM | POA: Insufficient documentation

## 2024-08-03 NOTE — Therapy (Signed)
 " OUTPATIENT PHYSICAL THERAPY NEURO TREATMENT   Patient Name: Richard Sandoval MRN: [id number] DOB:1945-12-24, 79 y.o., male Today's Date: 08/03/2024   PCP: [name], MD  REFERRING PROVIDER: Tat, Asberry RAMAN, DO  END OF SESSION:  PT End of Session - 08/03/24 1451     Visit Number 4    Number of Visits 17    Date for Recertification  09/01/24    Authorization Type Medicare/[employer]    PT Start Time 1451    PT Stop Time 1530    PT Time Calculation (min) 39 min    Activity Tolerance Patient tolerated treatment well    Behavior During Therapy [facility] for tasks assessed/performed           Past Medical History:  Diagnosis Date   Acute on chronic combined systolic and diastolic CHF (congestive heart failure) (HCC) 09/06/2021   CAD in native artery 09/06/2021   Chronic combined systolic and diastolic heart failure (HCC) 09/06/2021   DVT (deep venous thrombosis) (HCC)    L leg in 2009   Dyspnea    GERD (gastroesophageal reflux disease)    Hypertension    enlarged heart   Morbid obesity (HCC)    OSA (obstructive sleep apnea)    on CPAP since 1995   Pancreatitis    Pulmonary hypertension (HCC) 06/04/2017   Right heart failure (HCC) 08/27/2017   Past Surgical History:  Procedure Laterality Date   BACK SURGERY  04/09/1999   cervical laminectomy and fusion     CHOLECYSTECTOMY N/A 01/25/2014   Procedure: LAPAROSCOPIC CHOLECYSTECTOMY WITH INTRAOPERATIVE CHOLANGIOGRAM;  Surgeon: Krystal CHRISTELLA Spinner, MD;  Location: [facility];  Service: General;  Laterality: N/A;   COLONOSCOPY     COLONOSCOPY WITH PROPOFOL  N/A 03/04/2019   Procedure: COLONOSCOPY WITH PROPOFOL ;  Surgeon: Dianna Specking, MD;  Location: [facility];  Service: Endoscopy;  Laterality: N/A;   CORONARY PRESSURE/FFR STUDY N/A 09/11/2021   Procedure: INTRAVASCULAR PRESSURE WIRE/FFR STUDY;  Surgeon: Wendel Lurena POUR, MD;  Location: [facility];  Service: Cardiovascular;  Laterality: N/A;   EUS N/A 01/24/2014   Procedure: UPPER ENDOSCOPIC  ULTRASOUND (EUS) RADIAL;  Surgeon: Elsie Cree, MD;  Location: [facility];  Service: Endoscopy;  Laterality: N/A;   KNEE SURGERY  1998   left inguinal hernia     LUMBAR LAMINECTOMY     RIGHT/LEFT HEART CATH AND CORONARY ANGIOGRAPHY N/A 09/11/2021   Procedure: RIGHT/LEFT HEART CATH AND CORONARY ANGIOGRAPHY;  Surgeon: Wendel Lurena POUR, MD;  Location: [facility];  Service: Cardiovascular;  Laterality: N/A;   TRIGGER FINGER RELEASE  08/04/2012   Procedure: MINOR RELEASE TRIGGER FINGER/A-1 PULLEY;  Surgeon: Arley JONELLE Curia, MD;  Location: [facility];  Service: Orthopedics;  Laterality: Right;   Patient Active Problem List   Diagnosis Date Noted   Chronic combined systolic and diastolic heart failure (HCC) 09/06/2021   CAD in native artery 09/06/2021   History of colonic polyps 03/04/2019   Lumbar stenosis with neurogenic claudication 12/07/2017   Right heart failure (HCC) 08/27/2017   Pulmonary hypertension (HCC) 06/04/2017   GERD (gastroesophageal reflux disease) 02/21/2016   Bilateral pulmonary embolism (HCC) 02/20/2016   Hypokalemia 01/25/2014   Pancreatitis, gallstone 01/25/2014   Morbid obesity (HCC)    Pancreatitis 01/05/2014   Abnormal transaminases 01/05/2014   Dyspnea 12/15/2013   Upper airway cough syndrome 09/27/2013   Cellulitis and abscess 05/10/2012   Morbid (severe) obesity due to excess calories (HCC) 09/24/2006   DEPRESSIVE DISORDER, NOS 09/24/2006  BACK PAIN, LOW 09/24/2006   OSA (obstructive sleep apnea) 09/24/2006    ONSET DATE: pt diagnosed 3 months ago   REFERRING DIAG: G20.A1 (ICD-10-CM) - Parkinson's disease without dyskinesia or fluctuating manifestations (HCC)  THERAPY DIAG:  Other abnormalities of gait and mobility  Muscle weakness (generalized)  Parkinson's disease without dyskinesia or fluctuating manifestations (HCC)  Rationale for Evaluation and Treatment: Rehabilitation  SUBJECTIVE:                                                                                                                                                                                              SUBJECTIVE STATEMENT: Doing ok, no new issues. The right shoulder has been  Pt accompanied by: self  PERTINENT HISTORY: CHF, CAD, HTN, pulmonary HTN, cervical laminectomy and fusion, lumbar laminectomy, knee surgery  ED imaging 05/27/24 showed R hand/wrist fx.  Per 06/14/24 Dr. Reina note Today the findings were reviewed with the patient after program in detail we talked about the reason the rationale for weaning out of the brace. Gradual use and activity of the right hand. The patient was reassured of the findings of the left hand.  PAIN:  Are you having pain? Yes: NPRS scale: 8/10 Pain location: R shoulder  Pain description: old age pain Aggravating factors: falling on it Relieving factors: heating pad  PRECAUTIONS: Fall  RED FLAGS: None   WEIGHT BEARING RESTRICTIONS: Patient reports that he was not given any precautions for the R hand. Pt is wearing R wrist lace up brace today  FALLS: Has patient fallen in last 6 months? Yes. Number of falls 1  LIVING ENVIRONMENT: Lives with: lives with their spouse and lives with their daughter Lives in: House/apartment Stairs: 1 step to enter Has following equipment at home: Counselling psychologist, Environmental Consultant - 2 wheeled, and hospital bed  PLOF: Independent with basic ADLs, Independent with household mobility with device, and Vocation/Vocational requirements: retired  PATIENT GOALS: improve ability to get out of bed and get into tub   OBJECTIVE:    TODAY'S TREATMENT: 08/04/23 Activity Comments  Seated PWR moves   HEP review 100% recall                      Access Code: [email or web address] URL: [email or web address] Date: 07/18/2024 Prepared by: [facility] - Outpatient  Rehab - [facility]  Exercises - Seated Long Arc Quad  - 1 x daily - 7 x weekly - 3 sets - 10  reps - Seated March  - 1 x daily - 7 x weekly - 3 sets - 10 reps   PATIENT EDUCATION: Education  details: Berg balance test results, transfer technique, HEP initiated Person educated: Patient Education method: Explanation, Demonstration, Verbal cues, and Handouts Education comprehension: verbalized understanding, returned demonstration, verbal cues required, and needs further education  --------------------------------------------------- Note: Objective measures were completed at Evaluation unless otherwise noted.   *pt had difficulty standing up from waiting room chair. Eval was performed with pt sitting on elevated mat.   DIAGNOSTIC FINDINGS:  05/27/24 hand xray: Acute mildly displaced third metacarpal shaft fracture. Possible tiny ulnar styloid process fracture.   05/27/24 wrist xray:Possible tiny fracture at the tip of ulnar styloid process. See separately dictated hand radiographs   COGNITION: Overall cognitive status: Within functional limits for tasks assessed   SENSATION: Pt denies N/T in UEs/LEs  COORDINATION: Alternating pronation/supination: slowed B Alternating toe tap: WNL Finger to nose: slowed on L   MUSCLE TONE: WFL B LEs    POSTURE: rounded shoulders and forward head, flexed trunk  LOWER EXTREMITY ROM:     Active  Right Eval Left Eval  Hip flexion    Hip extension    Hip abduction    Hip adduction    Hip internal rotation    Hip external rotation    Knee flexion    Knee extension    Ankle dorsiflexion 9 11  Ankle plantarflexion    Ankle inversion    Ankle eversion     (Blank rows = not tested)  LOWER EXTREMITY MMT:    MMT Right Eval Left Eval  Hip flexion 5 5  Hip extension    Hip abduction 5 5  Hip adduction 4+ 4+  Hip internal rotation    Hip external rotation    Knee flexion 4+ 4+  Knee extension 4+ 5  Ankle dorsiflexion 4+ 4+  Ankle plantarflexion    Ankle inversion    Ankle eversion    (Blank rows = not  tested)  GAIT: Findings: Assistive device utilized:Quad cane small base, Level of assistance: SBA, and Comments: step-to/step through with short B step length and flexed trunk   FUNCTIONAL TESTS:  5xSTS from hi-low table on lowest setting and with B UE support: 39.21 sec  68M walk: 31.98 sec with quad cane (1.03 ft/sec)                                                                                                                             TREATMENT DATE: 07/07/24    PATIENT EDUCATION: Education details: prognosis, POC, edu on exam findings and how they relate to functional impairments; answered pt's questions on how to modify. Educated on pt's low voice volume and how ST could help with that Person educated: Patient Education method: Explanation Education comprehension: verbalized understanding  HOME EXERCISE PROGRAM: Not yet initiated   GOALS: Goals reviewed with patient? Yes  SHORT TERM GOALS: Target date: 08/04/2024  Patient to be independent with initial HEP. Baseline: HEP initiated Goal status: MET    LONG TERM GOALS: Target date: 09/01/2024  Patient to be independent with advanced HEP. Baseline: Not yet initiated  Goal status: IN PROGRESS  Patient to verbalize understanding of local Parkinson's Disease community resources including community fitness post D/C.   Baseline: Not yet initiated  Goal status: IN PROGRESS  Patient to improve Berg score to at least 45/56 to decrease risk of falls.  Baseline:  26/56 Goal status: IN PROGRESS  Patient to obtain tub bench to improve safety/confidence with bathing.  Baseline: does not have adaptive equipment for this Goal status: IN PROGRESS  Patient to demonstrate gait speed of at least 1.9 ft/sec in order to reduce falls risk.  Baseline: 1.03 ft/sec Goal status: IN PROGRESS  Patient to report 40% improvement in ease of performing bed mobility.  Baseline: - Goal status: IN PROGRESS  Patient to demonstrate 5xSTS test  in <20 sec in order to decrease risk of falls.   Baseline: 39.21 sec from hi-low table on lowest setting and with B UE support Goal status: IN PROGRESS  Patient to verbalize understanding of fall prevention in home environment information. Baseline: Not yet initiated Goal status: IN PROGRESS    ASSESSMENT:  CLINICAL IMPRESSION: Instruction in seated large amplitude movement system to improve coordination, flexibility, and with intent to increase speed/repetitions to make more cardiovascular in performance.  Initiated with video tutorial and visual step-by-step instruction followed by guided practice with therapist using mirror for visual feedback, verbal and visual cues of therapist performing along, and occasional tactile cues for sequence.  Considerable difficulty with the coordination aspect of these movements with difficulty in crossing midline and difficulty with coordinating foot/arm movements simultaneously requiring segmented performance. Cues tapered from 100% to 75% by end of session. Good return demonstration to HEP which consists of two seated exercises for leg strength.  Continued sessions to progress POC details to improve mobility   OBJECTIVE IMPAIRMENTS: Abnormal gait, decreased activity tolerance, decreased balance, decreased coordination, decreased knowledge of use of DME, difficulty walking, decreased strength, postural dysfunction, and pain.   ACTIVITY LIMITATIONS: carrying, lifting, bending, sitting, standing, squatting, sleeping, stairs, transfers, bed mobility, bathing, toileting, dressing, reach over head, hygiene/grooming, and locomotion level  PARTICIPATION LIMITATIONS: meal prep, cleaning, laundry, shopping, community activity, and church  PERSONAL FACTORS: Age, Fitness, Time since onset of injury/illness/exacerbation, and 3+ comorbidities: CHF, CAD, HTN, pulmonary HTN, cervical laminectomy and fusion, lumbar laminectomy, knee surgery  are also affecting patient's  functional outcome.   REHAB POTENTIAL: Good  CLINICAL DECISION MAKING: Evolving/moderate complexity  EVALUATION COMPLEXITY: Moderate  PLAN:  PT FREQUENCY: 2x/week  PT DURATION: 8 weeks  PLANNED INTERVENTIONS: 97164- PT Re-evaluation, 97110-Therapeutic exercises, 97530- Therapeutic activity, [id number]- Neuromuscular re-education, 97535- Self Care, [id number]- Manual therapy, [id number]- Gait training, [id number]- Canalith repositioning, [id number]- Aquatic Therapy, [phone] (1-2 muscles), [id number] (3+ muscles)- Dry Needling, Patient/Family education, Balance training, Stair training, Taping, Joint mobilization, Spinal mobilization, Vestibular training, DME instructions, Cryotherapy, and Moist heat  PLAN FOR NEXT SESSION: Edu on tub bench,  review seated PWR moves.  HEP progress for functional mobility and transfers; pt is newly diagnosed and needs edu on PD symptoms and etiology as well as community resources   2:52 PM, 08/03/2024 M. Kelly Millisa Giarrusso, PT, DPT Physical Therapist- [place] Office Number: [phone]          "

## 2024-08-05 ENCOUNTER — Encounter: Payer: Self-pay | Admitting: Neurology

## 2024-08-10 ENCOUNTER — Ambulatory Visit: Admitting: Physical Therapy

## 2024-08-15 ENCOUNTER — Encounter: Payer: Self-pay | Admitting: Physical Therapy

## 2024-08-15 ENCOUNTER — Ambulatory Visit: Admitting: Physical Therapy

## 2024-08-15 ENCOUNTER — Ambulatory Visit

## 2024-08-15 DIAGNOSIS — R2689 Other abnormalities of gait and mobility: Secondary | ICD-10-CM | POA: Diagnosis not present

## 2024-08-15 DIAGNOSIS — M6281 Muscle weakness (generalized): Secondary | ICD-10-CM

## 2024-08-15 NOTE — Therapy (Signed)
 " OUTPATIENT PHYSICAL THERAPY NEURO TREATMENT   Patient Name: Richard Sandoval MRN: [id number] DOB:[birth year], 79 y.o., male Today's Date: 08/15/2024   PCP: [name], MD  REFERRING PROVIDER: Tat, Asberry RAMAN, DO  END OF SESSION:  PT End of Session - 08/15/24 1503     Visit Number 5    Number of Visits 17    Date for Recertification  09/01/24    Authorization Type Medicare/[employer]    PT Start Time 1503   arrived late/clinician out sick; able to see due to cancellation   PT Stop Time 1530    PT Time Calculation (min) 27 min    Activity Tolerance Patient tolerated treatment well    Behavior During Therapy [facility] for tasks assessed/performed           Past Medical History:  Diagnosis Date   Acute on chronic combined systolic and diastolic CHF (congestive heart failure) (HCC) 09/06/2021   CAD in native artery 09/06/2021   Chronic combined systolic and diastolic heart failure (HCC) 09/06/2021   DVT (deep venous thrombosis) (HCC)    L leg in 2009   Dyspnea    GERD (gastroesophageal reflux disease)    Hypertension    enlarged heart   Morbid obesity (HCC)    OSA (obstructive sleep apnea)    on CPAP since 1995   Pancreatitis    Pulmonary hypertension (HCC) 06/04/2017   Right heart failure (HCC) 08/27/2017   Past Surgical History:  Procedure Laterality Date   BACK SURGERY  04/09/1999   cervical laminectomy and fusion     CHOLECYSTECTOMY N/A 01/25/2014   Procedure: LAPAROSCOPIC CHOLECYSTECTOMY WITH INTRAOPERATIVE CHOLANGIOGRAM;  Surgeon: Krystal CHRISTELLA Spinner, MD;  Location: [facility];  Service: General;  Laterality: N/A;   COLONOSCOPY     COLONOSCOPY WITH PROPOFOL  N/A 03/04/2019   Procedure: COLONOSCOPY WITH PROPOFOL ;  Surgeon: Dianna Specking, MD;  Location: [facility];  Service: Endoscopy;  Laterality: N/A;   CORONARY PRESSURE/FFR STUDY N/A 09/11/2021   Procedure: INTRAVASCULAR PRESSURE WIRE/FFR STUDY;  Surgeon: Wendel Lurena POUR, MD;  Location: [facility];  Service: Cardiovascular;   Laterality: N/A;   EUS N/A 01/24/2014   Procedure: UPPER ENDOSCOPIC ULTRASOUND (EUS) RADIAL;  Surgeon: Elsie Cree, MD;  Location: [facility];  Service: Endoscopy;  Laterality: N/A;   KNEE SURGERY  1998   left inguinal hernia     LUMBAR LAMINECTOMY     RIGHT/LEFT HEART CATH AND CORONARY ANGIOGRAPHY N/A 09/11/2021   Procedure: RIGHT/LEFT HEART CATH AND CORONARY ANGIOGRAPHY;  Surgeon: Wendel Lurena POUR, MD;  Location: [facility];  Service: Cardiovascular;  Laterality: N/A;   TRIGGER FINGER RELEASE  08/04/2012   Procedure: MINOR RELEASE TRIGGER FINGER/A-1 PULLEY;  Surgeon: Arley JONELLE Curia, MD;  Location:  SURGERY CENTER;  Service: Orthopedics;  Laterality: Right;   Patient Active Problem List   Diagnosis Date Noted   Chronic combined systolic and diastolic heart failure (HCC) 09/06/2021   CAD in native artery 09/06/2021   History of colonic polyps 03/04/2019   Lumbar stenosis with neurogenic claudication 12/07/2017   Right heart failure (HCC) 08/27/2017   Pulmonary hypertension (HCC) 06/04/2017   GERD (gastroesophageal reflux disease) 02/21/2016   Bilateral pulmonary embolism (HCC) 02/20/2016   Hypokalemia 01/25/2014   Pancreatitis, gallstone 01/25/2014   Morbid obesity (HCC)    Pancreatitis 01/05/2014   Abnormal transaminases 01/05/2014   Dyspnea 12/15/2013   Upper airway cough syndrome 09/27/2013   Cellulitis and abscess 05/10/2012   Morbid (severe) obesity due to excess  calories (HCC) 09/24/2006   DEPRESSIVE DISORDER, NOS 09/24/2006   BACK PAIN, LOW 09/24/2006   OSA (obstructive sleep apnea) 09/24/2006    ONSET DATE: pt diagnosed 3 months ago   REFERRING DIAG: G20.A1 (ICD-10-CM) - Parkinson's disease without dyskinesia or fluctuating manifestations (HCC)  THERAPY DIAG:  Other abnormalities of gait and mobility  Muscle weakness (generalized)  Rationale for Evaluation and Treatment: Rehabilitation  SUBJECTIVE:                                                                                                                                                                                              SUBJECTIVE STATEMENT: Doing okay.  No new falls. Pt accompanied by: self, wife, grandson  PERTINENT HISTORY: CHF, CAD, HTN, pulmonary HTN, cervical laminectomy and fusion, lumbar laminectomy, knee surgery  ED imaging 05/27/24 showed R hand/wrist fx.  Per 06/14/24 Dr. Reina note Today the findings were reviewed with the patient after program in detail we talked about the reason the rationale for weaning out of the brace. Gradual use and activity of the right hand. The patient was reassured of the findings of the left hand.  PAIN:  Are you having pain? Yes: NPRS scale: 8/10 Pain location: R shoulder/R wrist  Pain description: old age pain Aggravating factors: falling on it, cold weather Relieving factors: heating pad  PRECAUTIONS: Fall  RED FLAGS: None   WEIGHT BEARING RESTRICTIONS: Patient reports that he was not given any precautions for the R hand. Pt is wearing R wrist lace up brace today  FALLS: Has patient fallen in last 6 months? Yes. Number of falls 1  LIVING ENVIRONMENT: Lives with: lives with their spouse and lives with their daughter Lives in: House/apartment Stairs: 1 step to enter Has following equipment at home: Counselling psychologist, Environmental Consultant - 2 wheeled, and hospital bed  PLOF: Independent with basic ADLs, Independent with household mobility with device, and Vocation/Vocational requirements: retired  PATIENT GOALS: improve ability to get out of bed and get into tub   OBJECTIVE:   Pt wearing L wrist brace today.  Needs assist of therapist and grandson in lobby chair for sit to stand, holding onto RW in front of him, despite PT cues TODAY'S TREATMENT: 08/16/2023 Activity Comments  Seated PWR! Moves-PT provides visual, verbal, and tactile cues PWR! UP x 10 PWR! Rock x 5 reps each side PWR! Step x 5 reps each side  Sit to  stand from mat surface-cues for scooting forward, foot placement, rocking sequence and pushing up from knees 2 sets x 5 reps-able to perform with min assist  Access Code: [email or web address] URL: [email or web address] Date: 07/18/2024 Prepared by: [facility] - Outpatient  Rehab - [facility]  Exercises - Seated Long Arc Quad  - 1 x daily - 7 x weekly - 3 sets - 10 reps - Seated March  - 1 x daily - 7 x weekly - 3 sets - 10 reps   PATIENT EDUCATION: Education details: Family present today, so provided cues for optimal transfer sequence for decreased caregiver burden; discussed PWR! Moves and rationale for these exercises, but pt still needs lots of cueing, so did not provide for home Person educated: Patient Education method: Explanation, Demonstration, Verbal cues, and Handouts Education comprehension: verbalized understanding, returned demonstration, verbal cues required, and needs further education  --------------------------------------------------- Note: Objective measures were completed at Evaluation unless otherwise noted.   *pt had difficulty standing up from waiting room chair. Eval was performed with pt sitting on elevated mat.   DIAGNOSTIC FINDINGS:  05/27/24 hand xray: Acute mildly displaced third metacarpal shaft fracture. Possible tiny ulnar styloid process fracture.   05/27/24 wrist xray:Possible tiny fracture at the tip of ulnar styloid process. See separately dictated hand radiographs   COGNITION: Overall cognitive status: Within functional limits for tasks assessed   SENSATION: Pt denies N/T in UEs/LEs  COORDINATION: Alternating pronation/supination: slowed B Alternating toe tap: WNL Finger to nose: slowed on L   MUSCLE TONE: WFL B LEs    POSTURE: rounded shoulders and forward head, flexed trunk  LOWER EXTREMITY ROM:     Active  Right Eval Left Eval  Hip flexion    Hip extension    Hip abduction     Hip adduction    Hip internal rotation    Hip external rotation    Knee flexion    Knee extension    Ankle dorsiflexion 9 11  Ankle plantarflexion    Ankle inversion    Ankle eversion     (Blank rows = not tested)  LOWER EXTREMITY MMT:    MMT Right Eval Left Eval  Hip flexion 5 5  Hip extension    Hip abduction 5 5  Hip adduction 4+ 4+  Hip internal rotation    Hip external rotation    Knee flexion 4+ 4+  Knee extension 4+ 5  Ankle dorsiflexion 4+ 4+  Ankle plantarflexion    Ankle inversion    Ankle eversion    (Blank rows = not tested)  GAIT: Findings: Assistive device utilized:Quad cane small base, Level of assistance: SBA, and Comments: step-to/step through with short B step length and flexed trunk   FUNCTIONAL TESTS:  5xSTS from hi-low table on lowest setting and with B UE support: 39.21 sec  39M walk: 31.98 sec with quad cane (1.03 ft/sec)                                                                                                                             TREATMENT DATE: 07/07/24    PATIENT EDUCATION: Education  details: prognosis, POC, edu on exam findings and how they relate to functional impairments; answered pt's questions on how to modify. Educated on pt's low voice volume and how ST could help with that Person educated: Patient Education method: Explanation Education comprehension: verbalized understanding  HOME EXERCISE PROGRAM: Not yet initiated   GOALS: Goals reviewed with patient? Yes  SHORT TERM GOALS: Target date: 08/04/2024  Patient to be independent with initial HEP. Baseline: HEP initiated Goal status: MET    LONG TERM GOALS: Target date: 09/01/2024  Patient to be independent with advanced HEP. Baseline: Not yet initiated  Goal status: IN PROGRESS  Patient to verbalize understanding of local Parkinson's Disease community resources including community fitness post D/C.   Baseline: Not yet initiated  Goal status: IN  PROGRESS  Patient to improve Berg score to at least 45/56 to decrease risk of falls.  Baseline:  26/56 Goal status: IN PROGRESS  Patient to obtain tub bench to improve safety/confidence with bathing.  Baseline: does not have adaptive equipment for this Goal status: IN PROGRESS  Patient to demonstrate gait speed of at least 1.9 ft/sec in order to reduce falls risk.  Baseline: 1.03 ft/sec Goal status: IN PROGRESS  Patient to report 40% improvement in ease of performing bed mobility.  Baseline: - Goal status: IN PROGRESS  Patient to demonstrate 5xSTS test in <20 sec in order to decrease risk of falls.   Baseline: 39.21 sec from hi-low table on lowest setting and with B UE support Goal status: IN PROGRESS  Patient to verbalize understanding of fall prevention in home environment information. Baseline: Not yet initiated Goal status: IN PROGRESS    ASSESSMENT:  CLINICAL IMPRESSION: Pt presents today for scheduled appointment; however, his clinician is out unexpectedly today and pt did not get the cancel message.  This PT had a last minute opening and pt is agreeable to be seen for shorter time period of session today.  Wife and grandson are in lobby with patient and grandson assists with sit to stand transfer.  Worked in our session on optimal transfer sequence to decrease caregiver burden and optimize safety with transfer.  Worked on seated PWR! Moves; however, pt needs multi-modal cueing, so did not yet provide these exercises for home.  Pt will continue to benefit from skilled PT towards goals for improved functional mobility and decreased fall risk.   OBJECTIVE IMPAIRMENTS: Abnormal gait, decreased activity tolerance, decreased balance, decreased coordination, decreased knowledge of use of DME, difficulty walking, decreased strength, postural dysfunction, and pain.   ACTIVITY LIMITATIONS: carrying, lifting, bending, sitting, standing, squatting, sleeping, stairs, transfers, bed  mobility, bathing, toileting, dressing, reach over head, hygiene/grooming, and locomotion level  PARTICIPATION LIMITATIONS: meal prep, cleaning, laundry, shopping, community activity, and church  PERSONAL FACTORS: Age, Fitness, Time since onset of injury/illness/exacerbation, and 3+ comorbidities: CHF, CAD, HTN, pulmonary HTN, cervical laminectomy and fusion, lumbar laminectomy, knee surgery  are also affecting patient's functional outcome.   REHAB POTENTIAL: Good  CLINICAL DECISION MAKING: Evolving/moderate complexity  EVALUATION COMPLEXITY: Moderate  PLAN:  PT FREQUENCY: 2x/week  PT DURATION: 8 weeks  PLANNED INTERVENTIONS: 97164- PT Re-evaluation, 97110-Therapeutic exercises, 97530- Therapeutic activity, [id number]- Neuromuscular re-education, 97535- Self Care, [id number]- Manual therapy, [id number]- Gait training, [phone]- Canalith repositioning, [id number]- Aquatic Therapy, [phone] (1-2 muscles), [id number] (3+ muscles)- Dry Needling, Patient/Family education, Balance training, Stair training, Taping, Joint mobilization, Spinal mobilization, Vestibular training, DME instructions, Cryotherapy, and Moist heat  PLAN FOR NEXT SESSION:  HEP progress for functional mobility and transfers; pt is  newly diagnosed and needs edu on PD symptoms and etiology as well as community resources (would he be a candidate for Tuesday [facility] PD class?)   Greig Anon, PT 08/15/24 5:03 PM Phone: [phone] Fax: [phone]  [facility] at [facility] [address], [address] [place], [place] [address] Phone # [phone] Fax # [phone]          "

## 2024-08-17 ENCOUNTER — Ambulatory Visit

## 2024-08-18 ENCOUNTER — Ambulatory Visit

## 2024-08-18 DIAGNOSIS — G20A1 Parkinson's disease without dyskinesia, without mention of fluctuations: Secondary | ICD-10-CM

## 2024-08-18 DIAGNOSIS — M6281 Muscle weakness (generalized): Secondary | ICD-10-CM

## 2024-08-18 DIAGNOSIS — R2689 Other abnormalities of gait and mobility: Secondary | ICD-10-CM

## 2024-08-18 NOTE — Therapy (Signed)
 " OUTPATIENT PHYSICAL THERAPY NEURO TREATMENT   Patient Name: Richard Sandoval MRN: [id number] DOB:[birth year], 79 y.o., male Today's Date: 08/18/2024   PCP: [name], MD  REFERRING PROVIDER: Tat, Asberry RAMAN, DO  END OF SESSION:  PT End of Session - 08/18/24 1204     Visit Number 6    Number of Visits 17    Date for Recertification  09/01/24    Authorization Type Medicare/[employer]    PT Start Time 1200    PT Stop Time 1240    PT Time Calculation (min) 40 min    Activity Tolerance Patient tolerated treatment well    Behavior During Therapy [facility] for tasks assessed/performed           Past Medical History:  Diagnosis Date   Acute on chronic combined systolic and diastolic CHF (congestive heart failure) (HCC) 09/06/2021   CAD in native artery 09/06/2021   Chronic combined systolic and diastolic heart failure (HCC) 09/06/2021   DVT (deep venous thrombosis) (HCC)    L leg in 2009   Dyspnea    GERD (gastroesophageal reflux disease)    Hypertension    enlarged heart   Morbid obesity (HCC)    OSA (obstructive sleep apnea)    on CPAP since 1995   Pancreatitis    Pulmonary hypertension (HCC) 06/04/2017   Right heart failure (HCC) 08/27/2017   Past Surgical History:  Procedure Laterality Date   BACK SURGERY  04/09/1999   cervical laminectomy and fusion     CHOLECYSTECTOMY N/A 01/25/2014   Procedure: LAPAROSCOPIC CHOLECYSTECTOMY WITH INTRAOPERATIVE CHOLANGIOGRAM;  Surgeon: Krystal CHRISTELLA Spinner, MD;  Location: [facility];  Service: General;  Laterality: N/A;   COLONOSCOPY     COLONOSCOPY WITH PROPOFOL  N/A 03/04/2019   Procedure: COLONOSCOPY WITH PROPOFOL ;  Surgeon: Dianna Specking, MD;  Location: [facility];  Service: Endoscopy;  Laterality: N/A;   CORONARY PRESSURE/FFR STUDY N/A 09/11/2021   Procedure: INTRAVASCULAR PRESSURE WIRE/FFR STUDY;  Surgeon: Wendel Lurena POUR, MD;  Location: [facility];  Service: Cardiovascular;  Laterality: N/A;   EUS N/A 01/24/2014   Procedure: UPPER  ENDOSCOPIC ULTRASOUND (EUS) RADIAL;  Surgeon: Elsie Cree, MD;  Location: [facility];  Service: Endoscopy;  Laterality: N/A;   KNEE SURGERY  1998   left inguinal hernia     LUMBAR LAMINECTOMY     RIGHT/LEFT HEART CATH AND CORONARY ANGIOGRAPHY N/A 09/11/2021   Procedure: RIGHT/LEFT HEART CATH AND CORONARY ANGIOGRAPHY;  Surgeon: Wendel Lurena POUR, MD;  Location: [facility];  Service: Cardiovascular;  Laterality: N/A;   TRIGGER FINGER RELEASE  08/04/2012   Procedure: MINOR RELEASE TRIGGER FINGER/A-1 PULLEY;  Surgeon: Arley JONELLE Curia, MD;  Location: [facility];  Service: Orthopedics;  Laterality: Right;   Patient Active Problem List   Diagnosis Date Noted   Chronic combined systolic and diastolic heart failure (HCC) 09/06/2021   CAD in native artery 09/06/2021   History of colonic polyps 03/04/2019   Lumbar stenosis with neurogenic claudication 12/07/2017   Right heart failure (HCC) 08/27/2017   Pulmonary hypertension (HCC) 06/04/2017   GERD (gastroesophageal reflux disease) 02/21/2016   Bilateral pulmonary embolism (HCC) 02/20/2016   Hypokalemia 01/25/2014   Pancreatitis, gallstone 01/25/2014   Morbid obesity (HCC)    Pancreatitis 01/05/2014   Abnormal transaminases 01/05/2014   Dyspnea 12/15/2013   Upper airway cough syndrome 09/27/2013   Cellulitis and abscess 05/10/2012   Morbid (severe) obesity due to excess calories (HCC) 09/24/2006   DEPRESSIVE DISORDER, NOS 09/24/2006  BACK PAIN, LOW 09/24/2006   OSA (obstructive sleep apnea) 09/24/2006    ONSET DATE: pt diagnosed 3 months ago   REFERRING DIAG: G20.A1 (ICD-10-CM) - Parkinson's disease without dyskinesia or fluctuating manifestations (HCC)  THERAPY DIAG:  Other abnormalities of gait and mobility  Muscle weakness (generalized)  Parkinson's disease without dyskinesia or fluctuating manifestations (HCC)  Rationale for Evaluation and Treatment: Rehabilitation  SUBJECTIVE:                                                                                                                                                                                              SUBJECTIVE STATEMENT: Daughter accompanies and reports worsening cognition and softer voice quality Pt accompanied by: self, and daughter   PERTINENT HISTORY: CHF, CAD, HTN, pulmonary HTN, cervical laminectomy and fusion, lumbar laminectomy, knee surgery  ED imaging 05/27/24 showed R hand/wrist fx.  Per 06/14/24 Dr. Reina note Today the findings were reviewed with the patient after program in detail we talked about the reason the rationale for weaning out of the brace. Gradual use and activity of the right hand. The patient was reassured of the findings of the left hand.  PAIN:  Are you having pain? Yes: NPRS scale: 8/10 Pain location: R shoulder/R wrist  Pain description: old age pain Aggravating factors: falling on it, cold weather Relieving factors: heating pad  PRECAUTIONS: Fall  RED FLAGS: None   WEIGHT BEARING RESTRICTIONS: Patient reports that he was not given any precautions for the R hand. Pt is wearing R wrist lace up brace today  FALLS: Has patient fallen in last 6 months? Yes. Number of falls 1  LIVING ENVIRONMENT: Lives with: lives with their spouse and lives with their daughter Lives in: House/apartment Stairs: 1 step to enter Has following equipment at home: Counselling psychologist, Environmental Consultant - 2 wheeled, and hospital bed  PLOF: Independent with basic ADLs, Independent with household mobility with device, and Vocation/Vocational requirements: retired  PATIENT GOALS: improve ability to get out of bed and get into tub   OBJECTIVE:   TODAY'S TREATMENT: 08/18/24 Activity Comments  92%, 89 bpm, 140/80 mmHg   STS 3x5 reps 24 seat height, 30 sec rest periods btwn ses  Seated step-overs -initially w/ 6 hurdle, difficulty clearing height, use of cane for visual--difficulty with sequencing throughout entire  movement  Standing step-over W/ vertical grab bar           Pt wearing L wrist brace today.  Needs assist of therapist and grandson in lobby chair for sit to stand, holding onto RW in front of him, despite PT cues TODAY'S TREATMENT: 08/16/2023  Activity Comments  Seated PWR! Moves-PT provides visual, verbal, and tactile cues PWR! UP x 10 PWR! Rock x 5 reps each side PWR! Step x 5 reps each side  Sit to stand from mat surface-cues for scooting forward, foot placement, rocking sequence and pushing up from knees 2 sets x 5 reps-able to perform with min assist                         Access Code: [email or web address] URL: [email or web address] Date: 07/18/2024 Prepared by: [facility] - Outpatient  Rehab - [facility]  Exercises - Seated Long Arc Quad  - 1 x daily - 7 x weekly - 3 sets - 10 reps - Seated March  - 1 x daily - 7 x weekly - 3 sets - 10 reps   PATIENT EDUCATION: Education details: Family present today, so provided cues for optimal transfer sequence for decreased caregiver burden; discussed PWR! Moves and rationale for these exercises, but pt still needs lots of cueing, so did not provide for home Person educated: Patient Education method: Explanation, Demonstration, Verbal cues, and Handouts Education comprehension: verbalized understanding, returned demonstration, verbal cues required, and needs further education  --------------------------------------------------- Note: Objective measures were completed at Evaluation unless otherwise noted.   *pt had difficulty standing up from waiting room chair. Eval was performed with pt sitting on elevated mat.   DIAGNOSTIC FINDINGS:  05/27/24 hand xray: Acute mildly displaced third metacarpal shaft fracture. Possible tiny ulnar styloid process fracture.   05/27/24 wrist xray:Possible tiny fracture at the tip of ulnar styloid process. See separately dictated hand radiographs   COGNITION: Overall cognitive  status: Within functional limits for tasks assessed   SENSATION: Pt denies N/T in UEs/LEs  COORDINATION: Alternating pronation/supination: slowed B Alternating toe tap: WNL Finger to nose: slowed on L   MUSCLE TONE: WFL B LEs    POSTURE: rounded shoulders and forward head, flexed trunk  LOWER EXTREMITY ROM:     Active  Right Eval Left Eval  Hip flexion    Hip extension    Hip abduction    Hip adduction    Hip internal rotation    Hip external rotation    Knee flexion    Knee extension    Ankle dorsiflexion 9 11  Ankle plantarflexion    Ankle inversion    Ankle eversion     (Blank rows = not tested)  LOWER EXTREMITY MMT:    MMT Right Eval Left Eval  Hip flexion 5 5  Hip extension    Hip abduction 5 5  Hip adduction 4+ 4+  Hip internal rotation    Hip external rotation    Knee flexion 4+ 4+  Knee extension 4+ 5  Ankle dorsiflexion 4+ 4+  Ankle plantarflexion    Ankle inversion    Ankle eversion    (Blank rows = not tested)  GAIT: Findings: Assistive device utilized:Quad cane small base, Level of assistance: SBA, and Comments: step-to/step through with short B step length and flexed trunk   FUNCTIONAL TESTS:  5xSTS from hi-low table on lowest setting and with B UE support: 39.21 sec  103M walk: 31.98 sec with quad cane (1.03 ft/sec)  TREATMENT DATE: 07/07/24    PATIENT EDUCATION: Education details: prognosis, POC, edu on exam findings and how they relate to functional impairments; answered pt's questions on how to modify. Educated on pt's low voice volume and how ST could help with that Person educated: Patient Education method: Explanation Education comprehension: verbalized understanding  HOME EXERCISE PROGRAM: Not yet initiated   GOALS: Goals reviewed with patient? Yes  SHORT TERM GOALS: Target date: 08/04/2024  Patient  to be independent with initial HEP. Baseline: HEP initiated Goal status: MET    LONG TERM GOALS: Target date: 09/01/2024  Patient to be independent with advanced HEP. Baseline: Not yet initiated  Goal status: IN PROGRESS  Patient to verbalize understanding of local Parkinson's Disease community resources including community fitness post D/C.   Baseline: Not yet initiated  Goal status: IN PROGRESS  Patient to improve Berg score to at least 45/56 to decrease risk of falls.  Baseline:  26/56 Goal status: IN PROGRESS  Patient to obtain tub bench to improve safety/confidence with bathing.  Baseline: does not have adaptive equipment for this Goal status: IN PROGRESS  Patient to demonstrate gait speed of at least 1.9 ft/sec in order to reduce falls risk.  Baseline: 1.03 ft/sec Goal status: IN PROGRESS  Patient to report 40% improvement in ease of performing bed mobility.  Baseline: - Goal status: IN PROGRESS  Patient to demonstrate 5xSTS test in <20 sec in order to decrease risk of falls.   Baseline: 39.21 sec from hi-low table on lowest setting and with B UE support Goal status: IN PROGRESS  Patient to verbalize understanding of fall prevention in home environment information. Baseline: Not yet initiated Goal status: IN PROGRESS    ASSESSMENT:  CLINICAL IMPRESSION: Pt arrives with his daughter who endorses worsening cognition and functional mobility as of late (past month) requiring increased assistance.  This therapist is somewhat familiar with patient and today he does appear more confused and with flat affect vs his usual jovial and conversant nature. Instructed in functional mobility activities to improve independence with sit to stand requiring increased seat height to 24 with good carryover to trunk flexion over BOS and able to perform pushing hands to knees. Continued with seated step-overs to improve ability for car transfers and then standing step over's w/ HR support  to improve tub transfers.  Pt exhibits significant difficulty with attention and processing instruction in today's session despite visual mirroring of therapist and repeated multi-modal cues.  He does seem to be rather off as compared to past sessions here and I encouraged his daughter to reach out to PCP for further evaluation and daughter verbalized agreement  OBJECTIVE IMPAIRMENTS: Abnormal gait, decreased activity tolerance, decreased balance, decreased coordination, decreased knowledge of use of DME, difficulty walking, decreased strength, postural dysfunction, and pain.   ACTIVITY LIMITATIONS: carrying, lifting, bending, sitting, standing, squatting, sleeping, stairs, transfers, bed mobility, bathing, toileting, dressing, reach over head, hygiene/grooming, and locomotion level  PARTICIPATION LIMITATIONS: meal prep, cleaning, laundry, shopping, community activity, and church  PERSONAL FACTORS: Age, Fitness, Time since onset of injury/illness/exacerbation, and 3+ comorbidities: CHF, CAD, HTN, pulmonary HTN, cervical laminectomy and fusion, lumbar laminectomy, knee surgery  are also affecting patient's functional outcome.   REHAB POTENTIAL: Good  CLINICAL DECISION MAKING: Evolving/moderate complexity  EVALUATION COMPLEXITY: Moderate  PLAN:  PT FREQUENCY: 2x/week  PT DURATION: 8 weeks  PLANNED INTERVENTIONS: 97164- PT Re-evaluation, 97110-Therapeutic exercises, 97530- Therapeutic activity, [id number]- Neuromuscular re-education, 97535- Self Care, [id number]- Manual therapy, [id number]- Gait training, [phone]- Canalith  repositioning, [id number]- Aquatic Therapy, [phone] (1-2 muscles), [id number] (3+ muscles)- Dry Needling, Patient/Family education, Balance training, Stair training, Taping, Joint mobilization, Spinal mobilization, Vestibular training, DME instructions, Cryotherapy, and Moist heat  PLAN FOR NEXT SESSION:  HEP progress for functional mobility and transfers; pt is newly diagnosed and needs edu on PD  symptoms and etiology as well as community resources (would he be a candidate for Tuesday [facility] PD class?)   12:52 PM, 08/18/24 M. Kelly Sukaina Toothaker, PT, DPT Physical Therapist- [place] Office Number: [phone]          "

## 2024-08-19 ENCOUNTER — Encounter (HOSPITAL_COMMUNITY): Payer: Self-pay | Admitting: Internal Medicine

## 2024-08-19 ENCOUNTER — Emergency Department (HOSPITAL_COMMUNITY)

## 2024-08-19 ENCOUNTER — Inpatient Hospital Stay (HOSPITAL_COMMUNITY)
Admission: EM | Admit: 2024-08-19 | Source: Home / Self Care | Attending: Internal Medicine | Admitting: Internal Medicine

## 2024-08-19 DIAGNOSIS — D696 Thrombocytopenia, unspecified: Secondary | ICD-10-CM | POA: Insufficient documentation

## 2024-08-19 DIAGNOSIS — G20A1 Parkinson's disease without dyskinesia, without mention of fluctuations: Secondary | ICD-10-CM | POA: Insufficient documentation

## 2024-08-19 DIAGNOSIS — R4182 Altered mental status, unspecified: Principal | ICD-10-CM

## 2024-08-19 DIAGNOSIS — T68XXXA Hypothermia, initial encounter: Secondary | ICD-10-CM | POA: Diagnosis not present

## 2024-08-19 DIAGNOSIS — Z86711 Personal history of pulmonary embolism: Secondary | ICD-10-CM | POA: Diagnosis not present

## 2024-08-19 DIAGNOSIS — J188 Other pneumonia, unspecified organism: Secondary | ICD-10-CM

## 2024-08-19 DIAGNOSIS — I2510 Atherosclerotic heart disease of native coronary artery without angina pectoris: Secondary | ICD-10-CM | POA: Diagnosis present

## 2024-08-19 DIAGNOSIS — I5042 Chronic combined systolic (congestive) and diastolic (congestive) heart failure: Secondary | ICD-10-CM | POA: Diagnosis present

## 2024-08-19 DIAGNOSIS — K859 Acute pancreatitis without necrosis or infection, unspecified: Secondary | ICD-10-CM | POA: Diagnosis present

## 2024-08-19 DIAGNOSIS — A419 Sepsis, unspecified organism: Secondary | ICD-10-CM | POA: Diagnosis not present

## 2024-08-19 DIAGNOSIS — G9341 Metabolic encephalopathy: Secondary | ICD-10-CM | POA: Insufficient documentation

## 2024-08-19 DIAGNOSIS — G4733 Obstructive sleep apnea (adult) (pediatric): Secondary | ICD-10-CM | POA: Diagnosis present

## 2024-08-19 LAB — COMPREHENSIVE METABOLIC PANEL WITH GFR
ALT: 39 U/L (ref 0–44)
AST: 56 U/L — ABNORMAL HIGH (ref 15–41)
Albumin: 3.6 g/dL (ref 3.5–5.0)
Alkaline Phosphatase: 85 U/L (ref 38–126)
Anion gap: 9 (ref 5–15)
BUN: 16 mg/dL (ref 8–23)
CO2: 25 mmol/L (ref 22–32)
Calcium: 9.6 mg/dL (ref 8.9–10.3)
Chloride: 109 mmol/L (ref 98–111)
Creatinine, Ser: 0.85 mg/dL (ref 0.61–1.24)
GFR, Estimated: >60 mL/min
Glucose, Bld: 81 mg/dL (ref 70–99)
Potassium: 4.3 mmol/L (ref 3.5–5.1)
Sodium: 143 mmol/L (ref 135–145)
Total Bilirubin: 1.2 mg/dL (ref 0.0–1.2)
Total Protein: 6.8 g/dL (ref 6.5–8.1)

## 2024-08-19 LAB — PRO BRAIN NATRIURETIC PEPTIDE: Pro Brain Natriuretic Peptide: 1175 pg/mL — ABNORMAL HIGH

## 2024-08-19 LAB — CBC WITH DIFFERENTIAL/PLATELET
Abs Immature Granulocytes: 0.01 K/uL (ref 0.00–0.07)
Basophils Absolute: 0 K/uL (ref 0.0–0.1)
Basophils Relative: 0 %
Eosinophils Absolute: 0.1 K/uL (ref 0.0–0.5)
Eosinophils Relative: 2 %
HCT: 40.8 % (ref 39.0–52.0)
Hemoglobin: 13.2 g/dL (ref 13.0–17.0)
Immature Granulocytes: 0 %
Lymphocytes Relative: 21 %
Lymphs Abs: 0.6 K/uL — ABNORMAL LOW (ref 0.7–4.0)
MCH: 30 pg (ref 26.0–34.0)
MCHC: 32.4 g/dL (ref 30.0–36.0)
MCV: 92.7 fL (ref 80.0–100.0)
Monocytes Absolute: 0.3 K/uL (ref 0.1–1.0)
Monocytes Relative: 10 %
Neutro Abs: 1.9 K/uL (ref 1.7–7.7)
Neutrophils Relative %: 67 %
Platelets: 70 K/uL — ABNORMAL LOW (ref 150–400)
RBC: 4.4 MIL/uL (ref 4.22–5.81)
RDW: 14.4 % (ref 11.5–15.5)
Smear Review: NORMAL
WBC: 2.8 K/uL — ABNORMAL LOW (ref 4.0–10.5)
nRBC: 0 % (ref 0.0–0.2)

## 2024-08-19 LAB — TSH: TSH: 5.1 u[IU]/mL — ABNORMAL HIGH (ref 0.350–4.500)

## 2024-08-19 LAB — URINALYSIS, ROUTINE W REFLEX MICROSCOPIC
Bacteria, UA: NONE SEEN
Bilirubin Urine: NEGATIVE
Glucose, UA: NEGATIVE mg/dL
Ketones, ur: NEGATIVE mg/dL
Nitrite: NEGATIVE
Protein, ur: 30 mg/dL — AB
Specific Gravity, Urine: 1.021 (ref 1.005–1.030)
pH: 5 (ref 5.0–8.0)

## 2024-08-19 LAB — CK: Total CK: 39 U/L — ABNORMAL LOW (ref 49–397)

## 2024-08-19 LAB — D-DIMER, QUANTITATIVE: D-Dimer, Quant: 0.72 ug{FEU}/mL — ABNORMAL HIGH (ref 0.00–0.50)

## 2024-08-19 LAB — TROPONIN T, HIGH SENSITIVITY: Troponin T High Sensitivity: 21 ng/L — ABNORMAL HIGH (ref 0–19)

## 2024-08-19 LAB — I-STAT CG4 LACTIC ACID, ED
Lactic Acid, Venous: 5.9 mmol/L (ref 0.5–1.9)
Lactic Acid, Venous: 0.9 mmol/L (ref 0.5–1.9)

## 2024-08-19 LAB — CBG MONITORING, ED: Glucose-Capillary: 73 mg/dL (ref 70–99)

## 2024-08-19 LAB — LIPASE, BLOOD: Lipase: <10 U/L — ABNORMAL LOW (ref 11–51)

## 2024-08-19 LAB — CORTISOL: Cortisol, Plasma: 13.6 ug/dL

## 2024-08-19 MED ORDER — LACTATED RINGERS IV BOLUS (SEPSIS)
1000.0000 mL | Freq: Once | INTRAVENOUS | Status: AC
  Administered 2024-08-19: 1000 mL via INTRAVENOUS

## 2024-08-19 MED ORDER — VANCOMYCIN HCL IN DEXTROSE 1-5 GM/200ML-% IV SOLN
1000.0000 mg | Freq: Once | INTRAVENOUS | Status: AC
  Administered 2024-08-19: 1000 mg via INTRAVENOUS
  Filled 2024-08-19: qty 200

## 2024-08-19 MED ORDER — HEPARIN (PORCINE) 25000 UT/250ML-% IV SOLN
1600.0000 [IU]/h | INTRAVENOUS | Status: DC
  Administered 2024-08-20: 1600 [IU]/h via INTRAVENOUS
  Filled 2024-08-19: qty 250

## 2024-08-19 MED ORDER — ACETAMINOPHEN 650 MG RE SUPP: 650.0000 mg | Freq: Four times a day (QID) | RECTAL | Status: DC | PRN

## 2024-08-19 MED ORDER — SODIUM CHLORIDE 0.9 % IV SOLN
100.0000 mg | Freq: Two times a day (BID) | INTRAVENOUS | Status: DC
  Administered 2024-08-20 – 2024-08-21 (×3): 100 mg via INTRAVENOUS
  Filled 2024-08-19 (×3): qty 100

## 2024-08-19 MED ORDER — HEPARIN BOLUS VIA INFUSION
2000.0000 [IU] | Freq: Once | INTRAVENOUS | Status: AC
  Administered 2024-08-20: 2000 [IU] via INTRAVENOUS
  Filled 2024-08-19: qty 2000

## 2024-08-19 MED ORDER — ACETAMINOPHEN 325 MG PO TABS: 650.0000 mg | ORAL_TABLET | Freq: Four times a day (QID) | ORAL | Status: DC | PRN

## 2024-08-19 MED ORDER — CARBIDOPA-LEVODOPA 25-100 MG PO TABS
1.0000 | ORAL_TABLET | ORAL | Status: DC
  Administered 2024-08-19 – 2024-08-20 (×3): 1 via ORAL
  Filled 2024-08-19 (×10): qty 1

## 2024-08-19 MED ORDER — VANCOMYCIN HCL 10 G IV SOLR
2250.0000 mg | INTRAVENOUS | Status: DC
  Filled 2024-08-19: qty 22.5

## 2024-08-19 MED ORDER — SODIUM CHLORIDE 0.9 % IV SOLN
2.0000 g | Freq: Once | INTRAVENOUS | Status: AC
  Administered 2024-08-19: 2 g via INTRAVENOUS
  Filled 2024-08-19: qty 12.5

## 2024-08-19 MED ORDER — SODIUM CHLORIDE 0.9 % IV SOLN
2.0000 g | Freq: Three times a day (TID) | INTRAVENOUS | Status: DC
  Administered 2024-08-20 – 2024-08-21 (×4): 2 g via INTRAVENOUS
  Filled 2024-08-19 (×4): qty 12.5

## 2024-08-19 MED ORDER — LACTATED RINGERS IV SOLN: INTRAVENOUS | Status: DC

## 2024-08-19 MED ORDER — METRONIDAZOLE 500 MG/100ML IV SOLN
500.0000 mg | Freq: Once | INTRAVENOUS | Status: AC
  Administered 2024-08-19: 500 mg via INTRAVENOUS
  Filled 2024-08-19: qty 100

## 2024-08-19 MED ORDER — LACTATED RINGERS IV BOLUS (SEPSIS)
250.0000 mL | Freq: Once | INTRAVENOUS | Status: AC
  Administered 2024-08-19: 250 mL via INTRAVENOUS

## 2024-08-19 MED ORDER — SODIUM CHLORIDE 0.9 % IV SOLN: 2.0000 g | Freq: Once | INTRAVENOUS | Status: DC

## 2024-08-19 MED ORDER — IOHEXOL 300 MG/ML  SOLN
100.0000 mL | Freq: Once | INTRAMUSCULAR | Status: AC | PRN
  Administered 2024-08-19: 100 mL via INTRAVENOUS

## 2024-08-19 NOTE — ED Provider Triage Note (Signed)
 Emergency Medicine Provider Triage Evaluation Note  Richard Sandoval , a 79 y.o. male  was evaluated in triage.  Pt complains of altered mental status.  Wife at bedside reports that over the past 2 to 3 weeks she has noticed a decline in his mental status.  Patient does have a history of Parkinson's.  Wife reports that he is normally A&O x 4 however today he is A&O x 3.  Patient had no unilateral weakness.  No slurred speech.  Was able to have a equal smile.  No decrease in strength.  Review of Systems  Positive: Altered mental status Negative:   Physical Exam  BP (!) 141/96 (BP Location: Right Arm)   Pulse (!) 59   Temp 98.3 F (36.8 C) (Oral)   Resp 18   SpO2 100%  Gen:   Awake, no distress   Resp:  Normal effort  MSK:   Moves extremities without difficulty  Other:    Medical Decision Making  Medically screening exam initiated at 1:20 PM.  Appropriate orders placed.  Richard Sandoval was informed that the remainder of the evaluation will be completed by another provider, this initial triage assessment does not replace that evaluation, and the importance of remaining in the ED until their evaluation is complete.     Richard Sandoval, [place] 08/19/24 1323

## 2024-08-19 NOTE — H&P (Signed)
 " History and Physical    Richard Sandoval FMW:[id number] DOB: 1945/12/04 DOA: 08/19/2024  Patient coming from: Home.  Chief Complaint: Weakness.  Confusion.  HPI: Richard Sandoval is a 79 y.o. male with history of Parkinson's disease, chronic HFrEF, sleep apnea, history of gallstone pancreatitis, history of PE/DVT on Xarelto  was brought to the ER after patient's family found that patient has been increasingly weak confused over the last 2 weeks.  Patient's wife states that she has had influenza with pneumonia about 3 weeks ago and was treated with antibiotics.  Patient over the last 1 week has been having some productive cough.  Found to be more confused no fever or chills.  ED Course: In the ER patient was initially confused but improved while in the ER.  CT head and MRI brain did not show anything acute.  While in the ER patient's temperature was 90 F and was placed on [employer]  Cultures were obtained.  Patient's labs showed WBC of 2.8 platelets of 70.  CK 39 troponin 21 proBNP is 1100.  Creatinine is 0.8.  CT chest abdomen pelvis shows multifocal pneumonia with bronchitis of possible aspiration.  Early signs of pancreatitis though patient denies any abdominal pain and lipase was normal.  Patient was started on empiric antibiotics for sepsis admitted for further management.  Review of Systems: As per HPI, rest all negative.   Past Medical History:  Diagnosis Date   Acute on chronic combined systolic and diastolic CHF (congestive heart failure) (HCC) 09/06/2021   CAD in native artery 09/06/2021   Chronic combined systolic and diastolic heart failure (HCC) 09/06/2021   DVT (deep venous thrombosis) (HCC)    L leg in 2009   Dyspnea    GERD (gastroesophageal reflux disease)    Hypertension    enlarged heart   Morbid obesity (HCC)    OSA (obstructive sleep apnea)    on CPAP since 1995   Pancreatitis    Pulmonary hypertension (HCC) 06/04/2017   Right heart failure (HCC) 08/27/2017    Past  Surgical History:  Procedure Laterality Date   BACK SURGERY  04/09/1999   cervical laminectomy and fusion     CHOLECYSTECTOMY N/A 01/25/2014   Procedure: LAPAROSCOPIC CHOLECYSTECTOMY WITH INTRAOPERATIVE CHOLANGIOGRAM;  Surgeon: Krystal CHRISTELLA Spinner, MD;  Location: [facility];  Service: General;  Laterality: N/A;   COLONOSCOPY     COLONOSCOPY WITH PROPOFOL  N/A 03/04/2019   Procedure: COLONOSCOPY WITH PROPOFOL ;  Surgeon: Dianna Specking, MD;  Location: [facility];  Service: Endoscopy;  Laterality: N/A;   CORONARY PRESSURE/FFR STUDY N/A 09/11/2021   Procedure: INTRAVASCULAR PRESSURE WIRE/FFR STUDY;  Surgeon: Wendel Lurena POUR, MD;  Location: [facility];  Service: Cardiovascular;  Laterality: N/A;   EUS N/A 01/24/2014   Procedure: UPPER ENDOSCOPIC ULTRASOUND (EUS) RADIAL;  Surgeon: Elsie Cree, MD;  Location: [facility];  Service: Endoscopy;  Laterality: N/A;   KNEE SURGERY  1998   left inguinal hernia     LUMBAR LAMINECTOMY     RIGHT/LEFT HEART CATH AND CORONARY ANGIOGRAPHY N/A 09/11/2021   Procedure: RIGHT/LEFT HEART CATH AND CORONARY ANGIOGRAPHY;  Surgeon: Wendel Lurena POUR, MD;  Location: [facility];  Service: Cardiovascular;  Laterality: N/A;   TRIGGER FINGER RELEASE  08/04/2012   Procedure: MINOR RELEASE TRIGGER FINGER/A-1 PULLEY;  Surgeon: Arley JONELLE Curia, MD;  Location: [facility];  Service: Orthopedics;  Laterality: Right;     reports that he has never smoked. He has been exposed to tobacco smoke. He  has never used smokeless tobacco. He reports that he does not drink alcohol and does not use drugs.  Allergies[1]  Family History  Problem Relation Age of Onset   Breast cancer Mother    Gout Father    Hypertension Father    Diabetes Sister    Heart disease Brother     Prior to Admission medications  Medication Sig Start Date End Date Taking? Authorizing Provider  bismuth subsalicylate (PEPTO BISMOL) 262 MG/15ML suspension Take 30 mLs by mouth every 6 (six) hours  as needed for diarrhea or loose stools.    [provider]  buPROPion (WELLBUTRIN XL) 150 MG 24 hr tablet Take 1 tablet by mouth daily. 12/11/21   [provider]  carbidopa -levodopa  (SINEMET  IR) 25-100 MG tablet Take 1 tablet by mouth 3 (three) times daily. 10am/2pm/6pm 02/24/24   Tat, Asberry RAMAN, DO  famotidine  (PEPCID ) 40 MG tablet Take 1 tablet (40 mg total) by mouth daily. 04/27/23 06/02/24  Jerrol Agent, MD  furosemide  (LASIX ) 20 MG tablet Take 1 tablet (20 mg total) by mouth daily. 10/11/21 06/02/24  Walker, Caitlin S, NP  gabapentin (NEURONTIN) 100 MG capsule Take 100 mg by mouth at bedtime as needed.    [provider]  NON FORMULARY CPAP MACHINE QHS    [provider]  polyethylene glycol (MIRALAX  / GLYCOLAX ) packet Take 17 g by mouth daily as needed for mild constipation.    [provider]  Respiratory Therapy Supplies (CARETOUCH 2 CPAP HOSE HANGER) MISC at bedtime    [provider]  rosuvastatin  (CRESTOR ) 20 MG tablet Take 1 tablet (20 mg total) by mouth daily. 05/21/22 06/02/24  Raford Riggs, MD  spironolactone  (ALDACTONE ) 25 MG tablet Take 1 tablet (25 mg total) by mouth daily. 12/16/21 06/02/24  Walker, Caitlin S, NP  tamsulosin (FLOMAX) 0.4 MG CAPS capsule Take 0.8 mg by mouth daily.    [provider]  XARELTO  15 MG TABS tablet Take 15 mg by mouth daily with supper. with food 02/20/19   [provider]    Physical Exam: Constitutional: Moderately built and nourished. Vitals:   08/19/24 2200 08/19/24 2220 08/19/24 2221 08/19/24 2245  BP: 113/73   121/76  Pulse: 62 68  61  Resp: 12 (!) 25  13  Temp:   (!) 91.3 F (32.9 C)   TempSrc:   Rectal   SpO2: 97% 100%  98%   Eyes: Anicteric no pallor. ENMT: No discharge from the ears eyes nose or mouth. Neck: No mass felt.  No neck rigidity. Respiratory: No rhonchi or crepitations. Cardiovascular: S1-S2 heard. Abdomen: Soft nontender bowel sound  present. Musculoskeletal: No edema. Skin: No rash. Neurologic: Alert awake oriented to time place and person.  Moving all extremities but weak and fatigued appearing. Psychiatric: Alert awake oriented to time place and person.   Labs on Admission: I have personally reviewed following labs and imaging studies  CBC: Recent Labs  Lab 08/19/24 1425  WBC 2.8*  NEUTROABS 1.9  HGB 13.2  HCT 40.8  MCV 92.7  PLT 70*   Basic Metabolic Panel: Recent Labs  Lab 08/19/24 1425  NA 143  K 4.3  CL 109  CO2 25  GLUCOSE 81  BUN 16  CREATININE 0.85  CALCIUM  9.6   GFR: CrCl cannot be calculated (Unknown ideal weight.). Liver Function Tests: Recent Labs  Lab 08/19/24 1425  AST 56*  ALT 39  ALKPHOS 85  BILITOT 1.2  PROT 6.8  ALBUMIN  3.6   Recent  Labs  Lab 08/19/24 1425  LIPASE <10*   No results for input(s): AMMONIA in the last 168 hours. Coagulation Profile: No results for input(s): INR, PROTIME in the last 168 hours. Cardiac Enzymes: Recent Labs  Lab 08/19/24 1425  CKTOTAL 39*   BNP (last 3 results) Recent Labs    08/19/24 1425  PROBNP 1,175.0*   HbA1C: No results for input(s): HGBA1C in the last 72 hours. CBG: Recent Labs  Lab 08/19/24 1326  GLUCAP 73   Lipid Profile: No results for input(s): CHOL, HDL, LDLCALC, TRIG, CHOLHDL, LDLDIRECT in the last 72 hours. Thyroid  Function Tests: Recent Labs    08/19/24 2124  TSH 5.100*   Anemia Panel: No results for input(s): VITAMINB12, FOLATE, FERRITIN, TIBC, IRON, RETICCTPCT in the last 72 hours. Urine analysis:    Component Value Date/Time   COLORURINE YELLOW 08/19/2024 1607   APPEARANCEUR HAZY (A) 08/19/2024 1607   LABSPEC 1.021 08/19/2024 1607   PHURINE 5.0 08/19/2024 1607   GLUCOSEU NEGATIVE 08/19/2024 1607   HGBUR SMALL (A) 08/19/2024 1607   BILIRUBINUR NEGATIVE 08/19/2024 1607   KETONESUR NEGATIVE 08/19/2024 1607   PROTEINUR 30 (A) 08/19/2024 1607   UROBILINOGEN  2.0 (H) 01/21/2014 1136   NITRITE NEGATIVE 08/19/2024 1607   LEUKOCYTESUR MODERATE (A) 08/19/2024 1607   Sepsis Labs: @LABRCNTIP (procalcitonin:4,lacticidven:4) )No results found for this or any previous visit (from the past 240 hours).   Radiological Exams on Admission: CT CHEST ABDOMEN PELVIS W CONTRAST Result Date: 08/19/2024 EXAM: CT CHEST WITH CONTRAST 08/19/2024 09:00:00 PM TECHNIQUE: CT of the chest was performed with the administration of 100 mL of iohexol  (OMNIPAQUE ) 300 MG/ML solution. Multiplanar reformatted images are provided for review. Automated exposure control, iterative reconstruction, and/or weight based adjustment of the mA/kV was utilized to reduce the radiation dose to as low as reasonably achievable. COMPARISON: CT abdomen and pelvis 12/14/2023 and cardiac CT 08/01/2021. CLINICAL HISTORY: Sepsis; purulence in skin folds of groin. FINDINGS: MEDIASTINUM: Heart is unremarkable. No pericardial effusions. Calcification of the aorta and coronary arteries. No aortic aneurysm. The central airways are clear. The esophagus is decompressed. The thyroid  gland is unremarkable. LYMPH NODES: Mediastinal lymph nodes are not pathologically enlarged. No hilar or axillary lymphadenopathy. No significant pelvic or groin lymphadenopathy. LUNGS AND PLEURA: Moderate bilateral pleural effusions. Atelectasis in the lung bases. Multiple focal areas of ground glass infiltration throughout both lungs with a mostly perihilar distribution. Air bronchograms are present. This is most likely to represent multifocal pneumonia or aspiration. Bronchial wall thickening consistent with bronchitis. No pneumothorax. SOFT TISSUES/BONES: Degenerative changes in the spine and shoulders. Postoperative changes with lumbar laminectomies and posterior fixation hardware. Anterior fixation of the lower cervical spine. No acute bony abnormalities. No acute abnormality of the soft tissues. UPPER ABDOMEN: Surgical absence of the  gallbladder. No bile duct dilatation. Fatty infiltration of the pancreas with mild peripancreatic stranding, possibly early acute pancreatitis. No loculated collection. Spleen and adrenal glands are normal. Kidneys, ureters, and the bladder are normal. Prostate gland is not enlarged. Small scrotal hydroceles. Moderate-sized periumbilical hernia containing fat. No fat necrosis. Mild edema in the dependent abdominal and pelvic soft tissues. No loculated collections. The small bowel and colon are mostly decompressed. No inflammatory changes. The appendix is not visualized. Calcification of the abdominal aorta without aneurysm. IMPRESSION: 1. Multiple focal areas of ground glass infiltration throughout both lungs with a mostly perihilar distribution, likely representing multifocal pneumonia or aspiration, with associated bronchial wall thickening consistent with bronchitis. 2. Fatty infiltration of the pancreas with  mild peripancreatic stranding, possibly early acute pancreatitis, without loculated collection. 3. Moderate bilateral pleural effusions. 4. Moderate-sized periumbilical hernia containing fat, without fat necrosis. Electronically signed by: Elsie Gravely MD 08/19/2024 09:13 PM EST RP Workstation: [id number]   CT Cervical Spine Wo Contrast Result Date: 08/19/2024 CLINICAL DATA:  Difficulty walking generalized weakness EXAM: CT CERVICAL SPINE WITHOUT CONTRAST TECHNIQUE: Multidetector CT imaging of the cervical spine was performed without intravenous contrast. Multiplanar CT image reconstructions were also generated. RADIATION DOSE REDUCTION: This exam was performed according to the departmental dose-optimization program which includes automated exposure control, adjustment of the mA and/or kV according to patient size and/or use of iterative reconstruction technique. COMPARISON:  Radiograph 01/13/2024 FINDINGS: Alignment: Straightening of the cervical spine. Trace anterolisthesis C2 on C3. Facet alignment  is normal Skull base and vertebrae: No acute fracture. No primary bone lesion or focal pathologic process. Soft tissues and spinal canal: No prevertebral fluid or swelling. No visible canal hematoma. Disc levels: Anterior fusion hardware C3-C4 with solid interbody fusion. Partial ankylosis of the facets at C3-C4. Fusion of the right facets at C2-C3 mild degenerative change C4-C5, C5-C6, C6-C7 and C7-T1. Multilevel foraminal narrowing, worse on the right side at C4-C5. At least moderate canal stenosis C3-C4. Suspected mild canal stenosis at C4-C5 and C5-C6. Mild canal stenosis slightly inferior to the C2-C3 disc space secondary to osteophyte. Upper chest: Partially visualized pleural effusions. Other: None IMPRESSION: 1. Straightening of the cervical spine with postsurgical changes at C3-C4. No acute osseous abnormality. 2. Multilevel degenerative changes as described above 3. Partially visualized pleural effusions. Electronically Signed   By: Luke Bun M.D.   On: 08/19/2024 18:28   MR BRAIN WO CONTRAST Result Date: 08/19/2024 EXAM: MRI BRAIN WITHOUT CONTRAST 08/19/2024 05:04:20 PM TECHNIQUE: Multiplanar multisequence MRI of the head/brain was performed without the administration of intravenous contrast. COMPARISON: CT head 06/29/2025. CLINICAL HISTORY: Mental status change, unknown cause. FINDINGS: BRAIN AND VENTRICLES: No acute infarct. No intracranial hemorrhage. No mass. No midline shift. No hydrocephalus. The sella is unremarkable. Normal flow voids. Moderate generalized parenchymal volume loss. ORBITS: No significant abnormality. SINUSES AND MASTOIDS: No significant abnormality. BONES AND SOFT TISSUES: Normal marrow signal. No soft tissue abnormality. Anterior cervical fusion hardware in the partially visualized upper cervical spine. IMPRESSION: 1. No acute findings. 2. Moderate generalized parenchymal volume loss. Electronically signed by: Donnice Mania MD 08/19/2024 05:38 PM EST RP Workstation:  [id number]   CT HEAD WO CONTRAST Result Date: 08/19/2024 EXAM: CT HEAD WITHOUT CONTRAST 08/19/2024 01:51:33 PM TECHNIQUE: CT of the head was performed without the administration of intravenous contrast. Automated exposure control, iterative reconstruction, and/or weight based adjustment of the mA/kV was utilized to reduce the radiation dose to as low as reasonably achievable. COMPARISON: None available. CLINICAL HISTORY: AMS Altered mental status. FINDINGS: BRAIN AND VENTRICLES: Mild scattered white matter hypodensities which are nonspecific but most commonly represent chronic microvascular ischemic changes. No acute hemorrhage. No evidence of an acute territorial infarction. No hydrocephalus. No extra-axial collection. No mass effect or midline shift. ORBITS: No acute abnormality. SINUSES: No acute abnormality. SOFT TISSUES AND SKULL: No acute soft tissue abnormality. No skull fracture. IMPRESSION: 1. No acute intracranial abnormality. Electronically signed by: Prentice Spade MD 08/19/2024 02:01 PM EST RP Workstation: [id number]    EKG: Independently reviewed.  Normal sinus rhythm LBBB.  Assessment/Plan Principal Problem:   Sepsis (HCC) Active Problems:   OSA (obstructive sleep apnea)   Pancreatitis   Chronic combined systolic and diastolic heart failure (HCC)   CAD in  native artery   Parkinson's disease (HCC)   Hypothermia   Acute metabolic encephalopathy   History of pulmonary embolism    Sepsis likely source pneumonia presently on empiric antibiotics.  Follow-up cultures.  Check COVID and flu test.  Lactic acid improved with fluids from 5.9-0.9. Hypothermia likely from sepsis.  TSH was 5.1 free T41.2 Cortisol 13.6. Acute encephalopathy likely from sepsis MRI brain unremarkable.  No signs of meningitis.  At the time of my exam patient is alert awake oriented to time place and person. Parkinson's disease recently started on Sinemet . Leukopenia thrombocytopenia could be from sepsis.   Continue to monitor closely.  Any further worsening check for DIC panel. PE on Xarelto  presently will keep patient on heparin  infusion. History of chronic HFrEF takes Lasix  and spironolactone  which we will hold due to low normal blood pressure.  CT chest does show bilateral pleural effusion closely monitor respiratory status patient once blood pressure stabilizes will need diuresis. Possible pancreatitis seen on CAT scan but patient denies any abdominal pain. Sleep apnea on CPAP at bedtime.  Since patient has sepsis with severe hypothermia will need close monitoring further workup and more than 2 midnight stay.  DVT prophylaxis: Heparin  infusion. Code Status: Full code. Family Communication: Patient's wife. Disposition Plan: Stepdown. Consults called: None. Admission status: Inpatient.         [1]  Allergies Allergen Reactions   Lisinopril     Other Reaction(s): Not available   Ace Inhibitors     Other reaction(s): cough   Penicillins Itching and Other (See Comments)    Has patient had a PCN reaction causing immediate rash, facial/tongue/throat swelling, SOB or lightheadedness with hypotension: No Has patient had a PCN reaction causing severe rash involving mucus membranes or skin necrosis: No Has patient had a PCN reaction that required hospitalization No Has patient had a PCN reaction occurring within the last 10 years: No If all of the above answers are NO, then may proceed with Cephalosporin use.   "

## 2024-08-19 NOTE — Progress Notes (Signed)
 PHARMACY - ANTICOAGULATION CONSULT NOTE  Pharmacy Consult for heparin  Indication: hx of PE  Allergies[1]  Patient Measurements: Weight: 134.3 kg (296 lb 1.2 oz)  Vital Signs: Temp: 91.3 F (32.9 C) (01/23 2221) Temp Source: Rectal (01/23 2221) BP: 121/76 (01/23 2245) Pulse Rate: 61 (01/23 2245)  Labs: Recent Labs    08/19/24 1425  HGB 13.2  HCT 40.8  PLT 70*  CREATININE 0.85  CKTOTAL 39*    Estimated Creatinine Clearance: 100.4 mL/min (by C-G formula based on SCr of 0.85 mg/dL).   Medical History: Past Medical History:  Diagnosis Date   Acute on chronic combined systolic and diastolic CHF (congestive heart failure) (HCC) 09/06/2021   CAD in native artery 09/06/2021   Chronic combined systolic and diastolic heart failure (HCC) 09/06/2021   DVT (deep venous thrombosis) (HCC)    L leg in 2009   Dyspnea    GERD (gastroesophageal reflux disease)    Hypertension    enlarged heart   Morbid obesity (HCC)    OSA (obstructive sleep apnea)    on CPAP since 1995   Pancreatitis    Pulmonary hypertension (HCC) 06/04/2017   Right heart failure (HCC) 08/27/2017     Assessment: 79 y.o. male with history pertinent for Parkinson's disease, OSA on CPAP, elevated BMI, GERD, pancreatitis, hypokalemia, prior pulmonary embolism, pulmonary hypertension, HFpEF with a EF of 55 to 60%, CAD who presents complaining of altered mental status, pharmacy to dose heparin  drip. Pt was on xarelto in the past, no recent fill hx for the past 6 months  Hgb 13.2, plts 70, Scr 0.85  Goal of Therapy:  Heparin  level 0.3-0.7 units/ml Monitor platelets by anticoagulation protocol: Yes   Plan:  Baseline labs ordered STAT Due to low plts will give small bolus heparin  2000 units x 1 Start heparin  drip at 1600 units/hr Heparin  level in 8 hours Daily CBC  Leeroy Mace RPh 08/19/2024, 11:40 PM        [1]  Allergies Allergen Reactions   Lisinopril     Other Reaction(s): Not available   Ace  Inhibitors     Other reaction(s): cough   Penicillins Itching and Other (See Comments)    Has patient had a PCN reaction causing immediate rash, facial/tongue/throat swelling, SOB or lightheadedness with hypotension: No Has patient had a PCN reaction causing severe rash involving mucus membranes or skin necrosis: No Has patient had a PCN reaction that required hospitalization No Has patient had a PCN reaction occurring within the last 10 years: No If all of the above answers are NO, then may proceed with Cephalosporin use.

## 2024-08-19 NOTE — Progress Notes (Signed)
 Pharmacy Antibiotic Note  Richard Sandoval is a 79 y.o. male admitted on 08/19/2024 with sepsis.  Pharmacy has been consulted for vancomycin  dosing.  Plan: Vancomycin  2250mg  IV q24h (AUC 501.8, Scr 0.85) Follow renal function and clinical course  Weight: 134.3 kg (296 lb 1.2 oz)  Temp (24hrs), Avg:93.4 F (34.1 C), Min:90.7 F (32.6 C), Max:98.3 F (36.8 C)  Recent Labs  Lab 08/19/24 1425 08/19/24 2014 08/19/24 2234  WBC 2.8*  --   --   CREATININE 0.85  --   --   LATICACIDVEN  --  5.9* 0.9    Estimated Creatinine Clearance: 100.4 mL/min (by C-G formula based on SCr of 0.85 mg/dL).    Allergies[1]  Antimicrobials this admission: 1/23 vanc >> 1/23 cefepime >> 1/23 flagyl x 1 1/24 doxy >>  Dose adjustments this admission:   Microbiology results: 1/23 BCx:   Thank you for allowing pharmacy to be a part of this patients care. Leeroy Mace RPh 08/19/2024, 11:44 PM     [1]  Allergies Allergen Reactions   Lisinopril     Other Reaction(s): Not available   Ace Inhibitors     Other reaction(s): cough   Penicillins Itching and Other (See Comments)    Has patient had a PCN reaction causing immediate rash, facial/tongue/throat swelling, SOB or lightheadedness with hypotension: No Has patient had a PCN reaction causing severe rash involving mucus membranes or skin necrosis: No Has patient had a PCN reaction that required hospitalization No Has patient had a PCN reaction occurring within the last 10 years: No If all of the above answers are NO, then may proceed with Cephalosporin use.

## 2024-08-19 NOTE — ED Triage Notes (Addendum)
 Patient's wife reports hx of parkison's but has been forgetful/slowed speech/increased trouble walking. Patient normally a/o x 4, patient now a/o x 3 does not know year, patient slow to answer questions, increased generalized weakness, patient usually ambulatory with walker/cane but has been falling. No unilateral weakness noted. No facial asymmetry. PERRLA.

## 2024-08-19 NOTE — ED Provider Notes (Signed)
 " [facility] EMERGENCY DEPARTMENT AT [facility] Provider Note   CSN: [id number] Arrival date & time: 08/19/24  1302     History Chief Complaint  Patient presents with   Altered Mental Status    HPI: Richard Sandoval is a 79 y.o. male with history pertinent for Parkinson's disease, OSA on CPAP, elevated BMI, GERD, pancreatitis, hypokalemia, prior pulmonary embolism, pulmonary hypertension, HFpEF with a EF of 55 to 60%, CAD who presents complaining of altered mental status. Patient arrived via POV accompanied by wife.  History provided by patient and spouse/partner.  No interpreter required during this encounter.  Wife reports that patient has a history of Parkinson's, however is typically alert and oriented x 4, however is now confused to time, seems more slow and confused to walk, also reports that he has had decrease in his ability to balance, which he states that his physical therapist also noted a crease in his ability to balance.  Reports that this decline in his mental status has been present over the past 2 weeks and has been gradually progressive.  Reports that he has had poor p.o. intake over the past several weeks.  Reports that patient has had recent falls, however is not able to specify precisely when.  Reports that today the patient complained that he did not feel well so she decided to bring him to the emergency department, does not note whether or not he had any specific symptoms with this.  Patient awoken, patient denies any fever, chills, chest pain, shortness of breath, nausea, vomiting, diarrhea.  Patient's recorded medical, surgical, social, medication list and allergies were reviewed in the Snapshot window as part of the initial history.   Prior to Admission medications  Medication Sig Start Date End Date Taking? Authorizing Provider  bismuth subsalicylate (PEPTO BISMOL) 262 MG/15ML suspension Take 30 mLs by mouth every 6 (six) hours as needed for diarrhea or loose  stools.   Yes [provider]  carbidopa -levodopa  (SINEMET  IR) 25-100 MG tablet Take 1 tablet by mouth 3 (three) times daily. 10am/2pm/6pm 02/24/24  Yes Tat, Asberry RAMAN, DO  polyethylene glycol (MIRALAX  / GLYCOLAX ) packet Take 17 g by mouth daily as needed for mild constipation.   Yes [provider]  tamsulosin (FLOMAX) 0.4 MG CAPS capsule Take 0.4 mg by mouth daily.   Yes [provider]  XARELTO  15 MG TABS tablet Take 15 mg by mouth daily with supper. with food 02/20/19  Yes [provider]  buPROPion (WELLBUTRIN XL) 150 MG 24 hr tablet Take 1 tablet by mouth daily. Patient not taking: Reported on 08/21/2024 12/11/21   [provider]  famotidine  (PEPCID ) 40 MG tablet Take 1 tablet (40 mg total) by mouth daily. Patient not taking: Reported on 08/21/2024 04/27/23 06/02/24  Jerrol Agent, MD  furosemide  (LASIX ) 20 MG tablet Take 1 tablet (20 mg total) by mouth daily. Patient not taking: Reported on 08/21/2024 10/11/21 06/02/24  Walker, Caitlin S, NP  gabapentin (NEURONTIN) 100 MG capsule Take 100 mg by mouth at bedtime as needed. Patient not taking: Reported on 08/21/2024    [provider]  NON FORMULARY CPAP MACHINE QHS    [provider]  Respiratory Therapy Supplies (CARETOUCH 2 CPAP HOSE HANGER) MISC at bedtime    [provider]  rosuvastatin  (CRESTOR ) 40 MG tablet Take 40 mg by mouth daily. Patient not taking: Reported on 08/21/2024    [provider]  spironolactone  (ALDACTONE ) 25 MG tablet Take 1 tablet (25 mg  total) by mouth daily. Patient not taking: Reported on 08/21/2024 12/16/21 06/02/24  Vannie Reche RAMAN, NP     Allergies: Lisinopril, Ace inhibitors, and Penicillins   Review of Systems   ROS as per HPI  Physical Exam Updated Vital Signs BP 136/71 (BP Location: Right Arm)   Pulse 81   Temp 99.7 F (37.6 C) (Rectal)   Resp (!) 23   Wt 134.3 kg   SpO2 97%   BMI 42.48 kg/m  Physical Exam Vitals and  nursing note reviewed.  Constitutional:      General: He is not in acute distress.    Appearance: He is well-developed.  HENT:     Head: Normocephalic and atraumatic.  Eyes:     Conjunctiva/sclera: Conjunctivae normal.  Cardiovascular:     Rate and Rhythm: Normal rate and regular rhythm.     Heart sounds: No murmur heard. Pulmonary:     Effort: Pulmonary effort is normal. No respiratory distress.     Breath sounds: Normal breath sounds.  Abdominal:     Palpations: Abdomen is soft.     Tenderness: There is no abdominal tenderness.  Musculoskeletal:        General: No swelling.     Cervical back: Neck supple.     Right lower leg: Edema present.     Left lower leg: Edema present.  Skin:    General: Skin is warm and dry.     Capillary Refill: Capillary refill takes less than 2 seconds.  Neurological:     Mental Status: He is alert.  Psychiatric:        Mood and Affect: Mood normal.     ED Course/ Medical Decision Making/ A&P Clinical Course as of 08/22/24 1450  Fri Aug 19, 2024  2028 Patient with skin breakdown and purulence between the skin folds, no tenderness to palpation, no gross crepitance [LS]    Clinical Course User Index [LS] Rogelia Jerilynn RAMAN, MD    Procedures Procedures   Medications Ordered in ED Medications  Chlorhexidine  Gluconate Cloth 2 % PADS 6 each (6 each Topical Given 08/21/24 1029)  Oral care mouth rinse (has no administration in time range)  nystatin  cream (MYCOSTATIN ) ( Topical Given 08/22/24 1216)  dextrose  5 % in lactated ringers  infusion (0 mLs Intravenous Stopped 08/22/24 0853)  cefTRIAXone  (ROCEPHIN ) 2 g in sodium chloride  0.9 % 100 mL IVPB (0 g Intravenous Stopped 08/22/24 1258)  enoxaparin  (LOVENOX ) injection 130 mg (130 mg Subcutaneous Given 08/22/24 0500)  doxycycline  (VIBRAMYCIN ) 100 mg in sodium chloride  0.9 % 250 mL IVPB (0 mg Intravenous Stopped 08/22/24 1140)  furosemide  (LASIX ) injection 40 mg (40 mg Intravenous Given 08/22/24 1307)   Oral care mouth rinse (has no administration in time range)  feeding supplement (PROSource TF20) liquid 60 mL (60 mLs Per Tube Given 08/22/24 1428)  feeding supplement (OSMOLITE 1.5 CAL) liquid 1,000 mL (has no administration in time range)    Followed by  feeding supplement (OSMOLITE 1.5 CAL) liquid 1,000 mL (has no administration in time range)  carbidopa -levodopa  (SINEMET  IR) 25-100 MG per tablet immediate release 1 tablet (1 tablet Per Tube Given 08/22/24 1428)  acetaminophen  (TYLENOL ) tablet 650 mg (has no administration in time range)    Or  acetaminophen  (TYLENOL ) suppository 650 mg (has no administration in time range)  lactated ringers  bolus 1,000 mL (0 mLs Intravenous Stopped 08/19/24 1953)    And  lactated ringers  bolus 1,000 mL (0 mLs Intravenous Stopped 08/19/24 2037)    And  lactated ringers  bolus 250 mL (0 mLs Intravenous Stopped 08/19/24 2233)  metroNIDAZOLE  (FLAGYL ) IVPB 500 mg (0 mg Intravenous Stopped 08/19/24 2039)  vancomycin  (VANCOCIN ) IVPB 1000 mg/200 mL premix (0 mg Intravenous Stopped 08/19/24 2222)  ceFEPIme  (MAXIPIME ) 2 g in sodium chloride  0.9 % 100 mL IVPB (0 g Intravenous Stopped 08/19/24 1954)  iohexol  (OMNIPAQUE ) 300 MG/ML solution 100 mL (100 mLs Intravenous Contrast Given 08/19/24 2100)  heparin  bolus via infusion 2,000 Units (2,000 Units Intravenous Bolus from Bag 08/20/24 0110)  dextrose  50 % solution 12.5 g (12.5 g Intravenous Given 08/20/24 0330)  dextrose  50 % solution 12.5 g (12.5 g Intravenous Given 08/20/24 1313)  dextrose  50 % solution (  Duplicate 08/20/24 1314)  LORazepam  (ATIVAN ) injection 0.5 mg (0.5 mg Intravenous Given 08/21/24 0330)  dextrose  50 % solution 12.5 g (12.5 g Intravenous Given 08/22/24 1213)    Medical Decision Making:   Rachael Zapanta is a 79 y.o. male who presents for altered mental status as per above.  Physical exam is pertinent for bilateral lower extremity edema.   The differential includes but is not limited to sepsis, stroke, ICH,  progression of Parkinson's.  Independent historian: Spouse/partner  External data reviewed: Labs: reviewed prior labs for baseline  Initial Plan:  Screening labs including CBC and Metabolic panel to evaluate for infectious or metabolic etiology of disease.  Screening D-dimer given patient with history of prior pulmonary embolism Lipase, point-of-care glucose in the setting of poor oral intake Screening CK for rhabdomyolysis in the setting of generalized weakness Urinalysis with reflex culture ordered to evaluate for UTI or relevant urologic/nephrologic pathology.  CT head and CT C-spine to assess for trauma given reported recent falls MRI to evaluate for evidence of stroke given reported increased difficulty with word finding. EKG/Troponin testing/BNP testing to evaluate for cardiac pathology. Objective evaluation as below reviewed   Labs: Ordered, Independent interpretation, and Details: CK below the lower limit of normal, CBC with similar leukopenia in comparison to prior, stable anemia, no thrombocytopenia.  UA without evidence of UTI.  BNP mildly elevated at greater than 1000. Mildly elevated at 21.  D-dimer WNL for age-adjusted norm at 54.72 (age of 45) lipase undetectably low, CMP without AKI, emergent electrolyte derangement, emergent LFT abnormality  Radiology: Ordered, Independent interpretation, Details: Personally reviewed CT of the head, do not appreciate ICH, displaced fracture, loss of gray/white matter differentiation, MLS.  CT of the C-spine without displaced fracture, dislocation, traumatic malalignment.  MRI of the brain without overt hyperintensities.  CT of the chest abdomen pelvis without overt focal airspace opacification, obstructive bowel gas pattern, intra-abdominal free air, free fluid, significant fat stranding of the colon, and All images reviewed independently.  Agree with radiology report at this time.   DG Abd 1 View Result Date: 08/22/2024 EXAM: 1 VIEW XRAY OF THE  ABDOMEN 08/22/2024 12:21:00 PM COMPARISON: CT abdomen and pelvis 08/19/2024. CLINICAL HISTORY: 79 year old male. Encounter for nasogastric tube placement. FINDINGS: LINES, TUBES AND DEVICES: Enteric tube in place with tip projecting over the left upper quadrant, likely in the gastric body. BOWEL: Nonobstructive visible bowel gas pattern. SOFT TISSUES: Chronic cholecystectomy clips. No abnormal calcifications. BONES: Lumbar surgical hardware noted. No acute fracture. LUNG BASES: Patchy opacities in bilateral lung bases. Lower lung opacity not significantly changed. IMPRESSION: 1. Feeding tube tube tip in the gastric body. 2. Stable patchy lung opacities. Electronically signed by: Helayne Hurst MD 08/22/2024 12:37 PM EST RP Workstation: [id number]   CT CHEST ABDOMEN PELVIS W CONTRAST Result Date: 08/19/2024  EXAM: CT CHEST WITH CONTRAST 08/19/2024 09:00:00 PM TECHNIQUE: CT of the chest was performed with the administration of 100 mL of iohexol  (OMNIPAQUE ) 300 MG/ML solution. Multiplanar reformatted images are provided for review. Automated exposure control, iterative reconstruction, and/or weight based adjustment of the mA/kV was utilized to reduce the radiation dose to as low as reasonably achievable. COMPARISON: CT abdomen and pelvis 12/14/2023 and cardiac CT 08/01/2021. CLINICAL HISTORY: Sepsis; purulence in skin folds of groin. FINDINGS: MEDIASTINUM: Heart is unremarkable. No pericardial effusions. Calcification of the aorta and coronary arteries. No aortic aneurysm. The central airways are clear. The esophagus is decompressed. The thyroid  gland is unremarkable. LYMPH NODES: Mediastinal lymph nodes are not pathologically enlarged. No hilar or axillary lymphadenopathy. No significant pelvic or groin lymphadenopathy. LUNGS AND PLEURA: Moderate bilateral pleural effusions. Atelectasis in the lung bases. Multiple focal areas of ground glass infiltration throughout both lungs with a mostly perihilar distribution. Air  bronchograms are present. This is most likely to represent multifocal pneumonia or aspiration. Bronchial wall thickening consistent with bronchitis. No pneumothorax. SOFT TISSUES/BONES: Degenerative changes in the spine and shoulders. Postoperative changes with lumbar laminectomies and posterior fixation hardware. Anterior fixation of the lower cervical spine. No acute bony abnormalities. No acute abnormality of the soft tissues. UPPER ABDOMEN: Surgical absence of the gallbladder. No bile duct dilatation. Fatty infiltration of the pancreas with mild peripancreatic stranding, possibly early acute pancreatitis. No loculated collection. Spleen and adrenal glands are normal. Kidneys, ureters, and the bladder are normal. Prostate gland is not enlarged. Small scrotal hydroceles. Moderate-sized periumbilical hernia containing fat. No fat necrosis. Mild edema in the dependent abdominal and pelvic soft tissues. No loculated collections. The small bowel and colon are mostly decompressed. No inflammatory changes. The appendix is not visualized. Calcification of the abdominal aorta without aneurysm. IMPRESSION: 1. Multiple focal areas of ground glass infiltration throughout both lungs with a mostly perihilar distribution, likely representing multifocal pneumonia or aspiration, with associated bronchial wall thickening consistent with bronchitis. 2. Fatty infiltration of the pancreas with mild peripancreatic stranding, possibly early acute pancreatitis, without loculated collection. 3. Moderate bilateral pleural effusions. 4. Moderate-sized periumbilical hernia containing fat, without fat necrosis. Electronically signed by: Elsie Gravely MD 08/19/2024 09:13 PM EST RP Workstation: [id number]   CT Cervical Spine Wo Contrast Result Date: 08/19/2024 CLINICAL DATA:  Difficulty walking generalized weakness EXAM: CT CERVICAL SPINE WITHOUT CONTRAST TECHNIQUE: Multidetector CT imaging of the cervical spine was performed without  intravenous contrast. Multiplanar CT image reconstructions were also generated. RADIATION DOSE REDUCTION: This exam was performed according to the departmental dose-optimization program which includes automated exposure control, adjustment of the mA and/or kV according to patient size and/or use of iterative reconstruction technique. COMPARISON:  Radiograph 01/13/2024 FINDINGS: Alignment: Straightening of the cervical spine. Trace anterolisthesis C2 on C3. Facet alignment is normal Skull base and vertebrae: No acute fracture. No primary bone lesion or focal pathologic process. Soft tissues and spinal canal: No prevertebral fluid or swelling. No visible canal hematoma. Disc levels: Anterior fusion hardware C3-C4 with solid interbody fusion. Partial ankylosis of the facets at C3-C4. Fusion of the right facets at C2-C3 mild degenerative change C4-C5, C5-C6, C6-C7 and C7-T1. Multilevel foraminal narrowing, worse on the right side at C4-C5. At least moderate canal stenosis C3-C4. Suspected mild canal stenosis at C4-C5 and C5-C6. Mild canal stenosis slightly inferior to the C2-C3 disc space secondary to osteophyte. Upper chest: Partially visualized pleural effusions. Other: None IMPRESSION: 1. Straightening of the cervical spine with postsurgical changes at C3-C4. No  acute osseous abnormality. 2. Multilevel degenerative changes as described above 3. Partially visualized pleural effusions. Electronically Signed   By: Luke Bun M.D.   On: 08/19/2024 18:28   MR BRAIN WO CONTRAST Result Date: 08/19/2024 EXAM: MRI BRAIN WITHOUT CONTRAST 08/19/2024 05:04:20 PM TECHNIQUE: Multiplanar multisequence MRI of the head/brain was performed without the administration of intravenous contrast. COMPARISON: CT head 06/29/2025. CLINICAL HISTORY: Mental status change, unknown cause. FINDINGS: BRAIN AND VENTRICLES: No acute infarct. No intracranial hemorrhage. No mass. No midline shift. No hydrocephalus. The sella is unremarkable.  Normal flow voids. Moderate generalized parenchymal volume loss. ORBITS: No significant abnormality. SINUSES AND MASTOIDS: No significant abnormality. BONES AND SOFT TISSUES: Normal marrow signal. No soft tissue abnormality. Anterior cervical fusion hardware in the partially visualized upper cervical spine. IMPRESSION: 1. No acute findings. 2. Moderate generalized parenchymal volume loss. Electronically signed by: Donnice Mania MD 08/19/2024 05:38 PM EST RP Workstation: [id number]   CT HEAD WO CONTRAST Result Date: 08/19/2024 EXAM: CT HEAD WITHOUT CONTRAST 08/19/2024 01:51:33 PM TECHNIQUE: CT of the head was performed without the administration of intravenous contrast. Automated exposure control, iterative reconstruction, and/or weight based adjustment of the mA/kV was utilized to reduce the radiation dose to as low as reasonably achievable. COMPARISON: None available. CLINICAL HISTORY: AMS Altered mental status. FINDINGS: BRAIN AND VENTRICLES: Mild scattered white matter hypodensities which are nonspecific but most commonly represent chronic microvascular ischemic changes. No acute hemorrhage. No evidence of an acute territorial infarction. No hydrocephalus. No extra-axial collection. No mass effect or midline shift. ORBITS: No acute abnormality. SINUSES: No acute abnormality. SOFT TISSUES AND SKULL: No acute soft tissue abnormality. No skull fracture. IMPRESSION: 1. No acute intracranial abnormality. Electronically signed by: Prentice Spade MD 08/19/2024 02:01 PM EST RP Workstation: [id number]    EKG/Medicine tests: Ordered and Independent interpretation EKG Interpretation: Sinus rhythm Ventricular premature complex Prolonged PR interval Left bundle branch block Confirmed by Neysa Clap [phone]) on 08/20/2024 10:19:26 PM                Interventions: LR bolus, cefepime , Flagyl , vancomycin   See the EMR for full details regarding lab and imaging results.  Presents for acute on chronic altered mental  status, accompanied by wife.  Patient does not have acute complaints, however is a bit slow to respond on exam.  Patient does have complex medical history including Parkinson's, possible that presentation today is due to progression of underlying Parkinson's, however do feel that it is reasonable to obtain broad workup to rule out acute reversible cause given wife reports that this has been a change over the past 2 weeks.  Broad lab and imaging workup indicated as per initial plan above.  Workup initially came back reassuring, mild leukopenia, though this is similar in comparison to prior, remainder of labs do not demonstrate acute abnormality, and CT head, CT C-spine, as well as MRI brain are reassuring.  While patient was in the emergency department, vitals were rechecked, and patient was noted to be significantly hypothermic patient was initially normothermic with an oral temperature of 98 on arrival, however on recheck, oral up temperature unable to be obtained, and patient was hypothermic to 90 on rectal temperature, therefore patient activated as a code sepsis, given broad-spectrum antibiotics, started on [employer]  Additional reevaluation did not demonstrate evidence of intertrigo within the groin, however there is no tenderness to palpation or crepitus concerning for Fournier's gangrene.  Will obtain CT chest abdomen pelvis to further evaluate for etiology of sepsis/hypothermia.  I  did consult hospitalist for admission, spoke with Dr. Franky, who requested a stat TSH, cortisol, and a repeat lactic, patient's initial lactic was significantly elevated at 5.  Thereafter, he accepted the patient to service.  No additional acute events while patient was under my care.  Presentation is most consistent with acute complicated illness and Current presentation is complicated by underlying chronic conditions  Discussion of management or test interpretations with external provider(s): Dr. Franky,  hospitalists  Risk Drugs:OTC drugs and Prescription drug management Treatment: Decision regarding hospitalization  Disposition: ADMIT: I believe the patient requires admission for further care and management. The patient was admitted to hospitalists. Please see inpatient provider note for additional treatment plan details.   MDM generated using voice dictation software and may contain dictation errors.  Please contact me for any clarification or with any questions.  Clinical Impression:  1. Altered mental status, unspecified altered mental status type   2. Hypothermia, initial encounter      Admit   Final Clinical Impression(s) / ED Diagnoses Final diagnoses:  Altered mental status, unspecified altered mental status type  Hypothermia, initial encounter    Rx / DC Orders ED Discharge Orders     None        Rogelia Jerilynn RAMAN, MD 08/22/24 1450  "

## 2024-08-19 NOTE — Sepsis Progress Note (Signed)
Elink monitoring for the code sepsis protocol.   Notified provider of need to order lactic acid.

## 2024-08-20 DIAGNOSIS — J188 Other pneumonia, unspecified organism: Secondary | ICD-10-CM | POA: Diagnosis not present

## 2024-08-20 LAB — RESPIRATORY PANEL BY PCR

## 2024-08-20 LAB — COMPREHENSIVE METABOLIC PANEL WITH GFR
ALT: 10 U/L (ref 0–44)
AST: 46 U/L — ABNORMAL HIGH (ref 15–41)
Albumin: 3.2 g/dL — ABNORMAL LOW (ref 3.5–5.0)
Alkaline Phosphatase: 82 U/L (ref 38–126)
Anion gap: 7 (ref 5–15)
BUN: 16 mg/dL (ref 8–23)
CO2: 25 mmol/L (ref 22–32)
Calcium: 9.1 mg/dL (ref 8.9–10.3)
Chloride: 109 mmol/L (ref 98–111)
Creatinine, Ser: 0.79 mg/dL (ref 0.61–1.24)
GFR, Estimated: >60 mL/min
Glucose, Bld: 71 mg/dL (ref 70–99)
Potassium: 4.1 mmol/L (ref 3.5–5.1)
Sodium: 142 mmol/L (ref 135–145)
Total Bilirubin: 1.1 mg/dL (ref 0.0–1.2)
Total Protein: 6.1 g/dL — ABNORMAL LOW (ref 6.5–8.1)

## 2024-08-20 LAB — SARS CORONAVIRUS 2 BY RT PCR: SARS Coronavirus 2 by RT PCR: NEGATIVE

## 2024-08-20 LAB — GLUCOSE, CAPILLARY
Glucose-Capillary: 67 mg/dL — ABNORMAL LOW (ref 70–99)
Glucose-Capillary: 99 mg/dL (ref 70–99)
Glucose-Capillary: 61 mg/dL — ABNORMAL LOW (ref 70–99)
Glucose-Capillary: 85 mg/dL (ref 70–99)
Glucose-Capillary: 86 mg/dL (ref 70–99)
Glucose-Capillary: 64 mg/dL — ABNORMAL LOW (ref 70–99)
Glucose-Capillary: 77 mg/dL (ref 70–99)
Glucose-Capillary: 71 mg/dL (ref 70–99)
Glucose-Capillary: 71 mg/dL (ref 70–99)

## 2024-08-20 LAB — TROPONIN T, HIGH SENSITIVITY
Troponin T High Sensitivity: 20 ng/L — ABNORMAL HIGH (ref 0–19)
Troponin T High Sensitivity: 20 ng/L — ABNORMAL HIGH (ref 0–19)

## 2024-08-20 LAB — HEPARIN LEVEL (UNFRACTIONATED)
Heparin Unfractionated: 0.84 [IU]/mL — ABNORMAL HIGH (ref 0.30–0.70)
Heparin Unfractionated: >1.1 [IU]/mL — ABNORMAL HIGH (ref 0.30–0.70)

## 2024-08-20 LAB — CBC
HCT: 37.2 % — ABNORMAL LOW (ref 39.0–52.0)
Hemoglobin: 12.1 g/dL — ABNORMAL LOW (ref 13.0–17.0)
MCH: 29.6 pg (ref 26.0–34.0)
MCHC: 32.5 g/dL (ref 30.0–36.0)
MCV: 91 fL (ref 80.0–100.0)
Platelets: 67 10*3/uL — ABNORMAL LOW (ref 150–400)
RBC: 4.09 MIL/uL — ABNORMAL LOW (ref 4.22–5.81)
RDW: 14.4 % (ref 11.5–15.5)
WBC: 2.9 10*3/uL — ABNORMAL LOW (ref 4.0–10.5)
nRBC: 0 % (ref 0.0–0.2)

## 2024-08-20 LAB — APTT
aPTT: 194 s (ref 24–36)
aPTT: >200 s (ref 24–36)

## 2024-08-20 LAB — T4, FREE: Free T4: 1.24 ng/dL (ref 0.80–2.00)

## 2024-08-20 LAB — MRSA NEXT GEN BY PCR, NASAL: MRSA by PCR Next Gen: NOT DETECTED

## 2024-08-20 MED ORDER — NYSTATIN 100000 UNIT/GM EX CREA
TOPICAL_CREAM | Freq: Two times a day (BID) | CUTANEOUS | Status: DC
  Administered 2024-08-20 – 2024-08-23 (×4): 1 via TOPICAL
  Filled 2024-08-20 (×3): qty 30

## 2024-08-20 MED ORDER — DEXTROSE IN LACTATED RINGERS 5 % IV SOLN: INTRAVENOUS | Status: AC

## 2024-08-20 MED ORDER — DEXTROSE 50 % IV SOLN
12.5000 g | Freq: Once | INTRAVENOUS | Status: AC
  Administered 2024-08-20: 12.5 g via INTRAVENOUS

## 2024-08-20 MED ORDER — ORAL CARE MOUTH RINSE: 15.0000 mL | OROMUCOSAL | Status: DC | PRN

## 2024-08-20 MED ORDER — CHLORHEXIDINE GLUCONATE CLOTH 2 % EX PADS
6.0000 | MEDICATED_PAD | Freq: Every day | CUTANEOUS | Status: AC
  Administered 2024-08-20 – 2024-09-02 (×14): 6 via TOPICAL

## 2024-08-20 MED ORDER — DEXTROSE 50 % IV SOLN
INTRAVENOUS | Status: AC
  Filled 2024-08-20: qty 50

## 2024-08-20 MED ORDER — DEXTROSE 50 % IV SOLN
12.5000 g | INTRAVENOUS | Status: AC
  Administered 2024-08-20: 12.5 g via INTRAVENOUS
  Filled 2024-08-20: qty 50

## 2024-08-20 MED ORDER — RIVAROXABAN 20 MG PO TABS
20.0000 mg | ORAL_TABLET | Freq: Every day | ORAL | Status: DC
  Administered 2024-08-20: 20 mg via ORAL
  Filled 2024-08-20: qty 1

## 2024-08-20 NOTE — Progress Notes (Signed)
 " Progress Note   Patient: Richard Sandoval FMW:[id number] DOB: September 30, 1945 DOA: 08/19/2024     1 DOS: the patient was seen and examined on 08/20/2024    Brief hospital course: Richard Sandoval is a 79 y.o. male with history of Parkinson's disease, chronic HFrEF, sleep apnea, history of gallstone pancreatitis, history of PE/DVT on Xarelto  who was brought to the ER due to increasing weakness and confusion.  Recently treated for influenza with pneumonia.  On presentation, patient was confused, hypothermic.  CT chest abdomen pelvis showed multifocal pneumonia with concern for aspiration, with bronchitis.  Patient was admitted to the stepdown unit.  Started on [employer] Empiric antibiotics started.  Assessment and Plan:  Sepsis Present on admission, evidenced by lactic acidosis, transient hypotension, thrombocytopenia, leukopenia in the setting of infection Sepsis resolving. - Continue antibiotics.  Multifocal pneumonia CT concerning for multifocal pneumonia/aspiration pneumonia/bronchitis. Flu and COVID-negative. Patient was started on cefepime , doxycycline , vancomycin . Vancomycin  discontinued due to negative MRSA screen. - Continue cefepime , doxycycline . - Will consider switching to Zosyn  rather than cefepime  if patient's mental status does not improve. - Given concern for aspiration pneumonia, SLP evaluation ordered.  Acute encephalopathy Likely septic encephalopathy due to infection. MRI brain showed no acute findings.  Patient has moderate generalized parenchymal volume loss. - Continue to orient. - Delirium precautions.  Hypothermia Due to sepsis TSH was 5.1, free T4 1.2, cortisol 13.6.  -Continue Bair hugger. - Continue antibiotics for infection.  Concern for pancreatitis CT of the abdomen with findings concerning for pancreatitis, but patient with no abdominal pain and normal lipase levels. - Will continue to monitor.  Pulmonary embolism, on Xarelto  at home. - Continue home  Xarelto .  Parkinson's disease - Continue home Sinemet .  HFpEF TTE 06/27/24 with EF 55-60%, diastolic dyfunction Imaging shows bilateral pleural effusions. - Caution with IV fluids.      Subjective: Patient is lethargic but responsive.  He was able to swallow his pills without difficulty.  Physical Exam: BP (!) 107/58   Pulse 80   Temp (!) 97 F (36.1 C) (Rectal)   Resp (!) 22   Wt 134.3 kg   SpO2 98%   BMI 42.48 kg/m    General: Lethargic but responsive Eyes: Pupils equal, reactive  Oral cavity: moist mucous membranes  Head: Atraumatic, normocephalic  Neck: supple  Chest: clear to auscultation. No crackles, no wheezes  CVS: S1,S2 RRR. No murmurs  Abd: No distention, soft, non-tender. No masses palpable  Extr: Pedal edema   MSK: No joint deformities or swelling  Neurological: Grossly intact.    Data Reviewed:    Latest Ref Rng & Units 08/20/2024    1:56 AM 08/19/2024    2:25 PM 04/27/2023    1:46 PM  CBC  WBC 4.0 - 10.5 K/uL 2.9  2.8  3.6   Hemoglobin 13.0 - 17.0 g/dL 87.8  86.7  85.1   Hematocrit 39.0 - 52.0 % 37.2  40.8  45.9   Platelets 150 - 400 K/uL 67  70  156       Latest Ref Rng & Units 08/20/2024    1:56 AM 08/19/2024    2:25 PM 04/27/2023    1:46 PM  BMP  Glucose 70 - 99 mg/dL 71  81  896   BUN 8 - 23 mg/dL 16  16  13    Creatinine 0.61 - 1.24 mg/dL 9.20  9.14  8.95   Sodium 135 - 145 mmol/L 142  143  137   Potassium 3.5 -  5.1 mmol/L 4.1  4.3  3.9   Chloride 98 - 111 mmol/L 109  109  108   CO2 22 - 32 mmol/L 25  25  22    Calcium  8.9 - 10.3 mg/dL 9.1  9.6  8.9      Family Communication: Spoke with wife at bedside  Disposition: Status is: Inpatient Sepsis, hypothermia requiring IV antibiotics, Lawyer.   DVT ppx: Systemic anticoagulation with Xarelto .      Author: MDALA-GAUSI, Richard Proby AGATHA, MD 08/20/2024 12:57 PM  For on call review [email or web address].    "

## 2024-08-20 NOTE — Progress Notes (Signed)
" °   08/20/24 0245  BiPAP/CPAP/SIPAP  $ Non-Invasive Home Ventilator  Initial  $ Face Mask Large  Yes  BiPAP/CPAP/SIPAP Pt Type Adult  BiPAP/CPAP/SIPAP Resmed  Mask Type Full face mask  Mask Size Large  Respiratory Rate 16 breaths/min  PEEP 10 cmH20  FiO2 (%) 21 %  Patient Home Machine No  Patient Home Mask No  Patient Home Tubing No  Auto Titrate No  Nasal massage performed Yes  CPAP/SIPAP surface wiped down Yes  Device Plugged into RED Power Outlet Yes  BiPAP/CPAP /SiPAP Vitals  Temp (!) 93.7 F (34.3 C)  Pulse Rate 68  Resp 16  SpO2 98 %  Bilateral Breath Sounds Diminished  MEWS Score/Color  MEWS Score 3  MEWS Score Color Yellow    "

## 2024-08-20 NOTE — Plan of Care (Signed)
   Problem: Skin Integrity: Goal: Risk for impaired skin integrity will decrease Outcome: Progressing

## 2024-08-20 NOTE — Progress Notes (Signed)
" °   08/20/24 0245  BiPAP/CPAP/SIPAP  $ Non-Invasive Home Ventilator  Initial  BiPAP/CPAP/SIPAP Pt Type Adult  BiPAP/CPAP/SIPAP Resmed  Mask Type Nasal mask  Mask Size Medium  Respiratory Rate 16 breaths/min  PEEP 10 cmH20  FiO2 (%) 21 %  Patient Home Machine No  Patient Home Mask No  Patient Home Tubing No  Auto Titrate No  Nasal massage performed Yes  CPAP/SIPAP surface wiped down Yes  Device Plugged into RED Power Outlet Yes  BiPAP/CPAP /SiPAP Vitals  Temp (!) 93.7 F (34.3 C)  Pulse Rate 68  Resp 16  SpO2 98 %  Bilateral Breath Sounds Diminished    "

## 2024-08-20 NOTE — Progress Notes (Signed)
 SLP Cancellation Note  Patient Details Name: Murphy Duzan MRN: [id number] DOB: 08/03/1945   Cancelled treatment:       Reason Eval/Treat Not Completed: Patient's level of consciousness Patient with eyes closed, not alert, not oriented, spouse in room. SLP will follow for readiness.   Norleen IVAR Blase, MA, CCC-SLP Speech Therapy  08/20/2024, 9:53 AM

## 2024-08-20 NOTE — Progress Notes (Signed)
 SLP Cancellation Note  Patient Details Name: Richard Sandoval MRN: [id number] DOB: [birth year]   Cancelled treatment:       Reason Eval/Treat Not Completed: Patient's level of consciousness Patient still asleep, lethargic. SLP recommends NPO until he is adequately alert.   Norleen IVAR Blase, MA, CCC-SLP Speech Therapy  08/20/2024, 1:36 PM

## 2024-08-21 DIAGNOSIS — J188 Other pneumonia, unspecified organism: Secondary | ICD-10-CM | POA: Diagnosis not present

## 2024-08-21 LAB — COMPREHENSIVE METABOLIC PANEL WITH GFR
ALT: 20 U/L (ref 0–44)
AST: 46 U/L — ABNORMAL HIGH (ref 15–41)
Albumin: 3.3 g/dL — ABNORMAL LOW (ref 3.5–5.0)
Alkaline Phosphatase: 84 U/L (ref 38–126)
Anion gap: 9 (ref 5–15)
BUN: 13 mg/dL (ref 8–23)
CO2: 22 mmol/L (ref 22–32)
Calcium: 9.2 mg/dL (ref 8.9–10.3)
Chloride: 109 mmol/L (ref 98–111)
Creatinine, Ser: 1.15 mg/dL (ref 0.61–1.24)
GFR, Estimated: >60 mL/min
Glucose, Bld: 88 mg/dL (ref 70–99)
Potassium: 4.8 mmol/L (ref 3.5–5.1)
Sodium: 140 mmol/L (ref 135–145)
Total Bilirubin: 1.7 mg/dL — ABNORMAL HIGH (ref 0.0–1.2)
Total Protein: 6.6 g/dL (ref 6.5–8.1)

## 2024-08-21 LAB — CBC WITH DIFFERENTIAL/PLATELET
Abs Immature Granulocytes: 0.03 10*3/uL (ref 0.00–0.07)
Basophils Absolute: 0 10*3/uL (ref 0.0–0.1)
Basophils Relative: 1 %
Eosinophils Absolute: 0 10*3/uL (ref 0.0–0.5)
Eosinophils Relative: 1 %
HCT: 39.6 % (ref 39.0–52.0)
Hemoglobin: 12.6 g/dL — ABNORMAL LOW (ref 13.0–17.0)
Immature Granulocytes: 1 %
Lymphocytes Relative: 25 %
Lymphs Abs: 1 10*3/uL (ref 0.7–4.0)
MCH: 29.6 pg (ref 26.0–34.0)
MCHC: 31.8 g/dL (ref 30.0–36.0)
MCV: 93 fL (ref 80.0–100.0)
Monocytes Absolute: 0.7 10*3/uL (ref 0.1–1.0)
Monocytes Relative: 16 %
Neutro Abs: 2.4 10*3/uL (ref 1.7–7.7)
Neutrophils Relative %: 56 %
Platelets: 73 10*3/uL — ABNORMAL LOW (ref 150–400)
RBC: 4.26 MIL/uL (ref 4.22–5.81)
RDW: 14.6 % (ref 11.5–15.5)
WBC: 4.3 10*3/uL (ref 4.0–10.5)
nRBC: 0 % (ref 0.0–0.2)

## 2024-08-21 LAB — GLUCOSE, CAPILLARY
Glucose-Capillary: 73 mg/dL (ref 70–99)
Glucose-Capillary: 81 mg/dL (ref 70–99)

## 2024-08-21 MED ORDER — SODIUM CHLORIDE 0.9 % IV SOLN
100.0000 mg | Freq: Two times a day (BID) | INTRAVENOUS | Status: DC
  Administered 2024-08-21 – 2024-08-24 (×6): 100 mg via INTRAVENOUS
  Filled 2024-08-21 (×6): qty 100

## 2024-08-21 MED ORDER — SODIUM CHLORIDE 0.9 % IV SOLN
2.0000 g | Freq: Every day | INTRAVENOUS | Status: AC
  Administered 2024-08-21 – 2024-08-25 (×5): 2 g via INTRAVENOUS
  Filled 2024-08-21 (×5): qty 20

## 2024-08-21 MED ORDER — DOXYCYCLINE HYCLATE 100 MG PO TABS: 100.0000 mg | ORAL_TABLET | Freq: Two times a day (BID) | ORAL | Status: DC

## 2024-08-21 MED ORDER — LORAZEPAM 2 MG/ML IJ SOLN
0.5000 mg | Freq: Once | INTRAMUSCULAR | Status: AC
  Administered 2024-08-21: 0.5 mg via INTRAVENOUS
  Filled 2024-08-21: qty 1

## 2024-08-21 MED ORDER — ENOXAPARIN SODIUM 150 MG/ML IJ SOSY
130.0000 mg | PREFILLED_SYRINGE | Freq: Two times a day (BID) | INTRAMUSCULAR | Status: DC
  Administered 2024-08-21 – 2024-08-23 (×4): 130 mg via SUBCUTANEOUS
  Filled 2024-08-21 (×4): qty 0.86

## 2024-08-21 NOTE — Progress Notes (Signed)
 " Progress Note   Patient: Richard Sandoval FMW:[id number] DOB: [birth year] DOA: 08/19/2024     2 DOS: the patient was seen and examined on 08/21/2024    Brief hospital course: Richard Sandoval is a 79 y.o. male with history of Parkinson's disease, chronic HFrEF, sleep apnea, history of gallstone pancreatitis, history of PE/DVT on Xarelto  who was brought to the ER due to increasing weakness and confusion.  Recently treated for influenza with pneumonia.  On presentation, patient was confused, hypothermic.  CT chest abdomen pelvis showed multifocal pneumonia with concern for aspiration, with bronchitis.  Patient was admitted to the stepdown unit.  Started on [employer] Empiric antibiotics started.  Assessment and Plan:  Sepsis Present on admission, evidenced by lactic acidosis, transient hypotension, thrombocytopenia, leukopenia in the setting of infection Sepsis resolving. - Continue antibiotics.  Multifocal pneumonia CT concerning for multifocal pneumonia/aspiration pneumonia/bronchitis. Flu and COVID-negative. Patient was started on cefepime , doxycycline , vancomycin . Vancomycin  discontinued due to negative MRSA screen. - Dced cefepime  on 1/25 due to encephalopathy, switched to Ceftriaxone  (unable to order Zosyn  as patient has a penicillin allergy). - Continue doxycycline . - Given concern for aspiration pneumonia, SLP evaluation ordered.  Acute encephalopathy Likely septic encephalopathy due to infection and possibly exacerbated by Cefepime . MRI brain showed no acute findings.  Patient has moderate generalized parenchymal volume loss. - Continue to orient. - Delirium precautions. - Switched from cefepime  to ceftriaxone  as indicated above.   Hypothermia, resolved Due to sepsis. S/p Bair hugger (now discontinued)  TSH was 5.1, free T4 1.2, cortisol 13.6.  - Continue antibiotics for infection.  Concern for pancreatitis CT of the abdomen with findings concerning for pancreatitis, but  patient with no abdominal pain and normal lipase levels. - Will continue to monitor.  Pulmonary embolism, on Xarelto  at home. - Continue home Xarelto .  Parkinson's disease - Continue home Sinemet .  HFpEF TTE 06/27/24 with EF 55-60%, diastolic dyfunction Imaging shows bilateral pleural effusions. - Caution with IV fluids.     Subjective: Patient is lethargic. Was agitated overnight  Physical Exam: BP 126/78   Pulse 81   Temp 98.2 F (36.8 C)   Resp 16   Wt 134.3 kg   SpO2 97%   BMI 42.48 kg/m    General: Lethargic Eyes: Pupils equal, reactive  Oral cavity: moist mucous membranes  Head: Atraumatic, normocephalic  Neck: supple  Chest: transmitted sounds CVS: S1,S2 RRR. No murmurs  Abd: No distention, soft, non-tender. No masses palpable  Extr: Pedal edema ++ MSK: No joint deformities or swelling  Neurological: Grossly intact.    Data Reviewed:    Latest Ref Rng & Units 08/21/2024    7:13 AM 08/20/2024    1:56 AM 08/19/2024    2:25 PM  CBC  WBC 4.0 - 10.5 K/uL 4.3  2.9  2.8   Hemoglobin 13.0 - 17.0 g/dL 87.3  87.8  86.7   Hematocrit 39.0 - 52.0 % 39.6  37.2  40.8   Platelets 150 - 400 K/uL 73  67  70       Latest Ref Rng & Units 08/21/2024    7:13 AM 08/20/2024    1:56 AM 08/19/2024    2:25 PM  BMP  Glucose 70 - 99 mg/dL 88  71  81   BUN 8 - 23 mg/dL 13  16  16    Creatinine 0.61 - 1.24 mg/dL 8.84  9.20  9.14   Sodium 135 - 145 mmol/L 140  142  143   Potassium 3.5 -  5.1 mmol/L 4.8  4.1  4.3   Chloride 98 - 111 mmol/L 109  109  109   CO2 22 - 32 mmol/L 22  25  25    Calcium  8.9 - 10.3 mg/dL 9.2  9.1  9.6      Family Communication: Spoke with wife at bedside  Disposition: Status is: Inpatient Sepsis, requiring IV antibiotics.   DVT ppx: Systemic anticoagulation with Xarelto .      Author: MDALA-GAUSI, Adine Heimann AGATHA, MD 08/21/2024 2:12 PM  For on call review [email or web address].    "

## 2024-08-21 NOTE — Progress Notes (Signed)
" °   08/21/24 2347  BiPAP/CPAP/SIPAP  BiPAP/CPAP/SIPAP Pt Type Adult  BiPAP/CPAP/SIPAP Resmed  Mask Type Full face mask  Mask Size Large  Respiratory Rate 18 breaths/min  PEEP 10 cmH20  FiO2 (%) 21 %  Patient Home Machine No  Patient Home Mask No  Patient Home Tubing No  Auto Titrate No  Device Plugged into RED Power Outlet Yes  BiPAP/CPAP /SiPAP Vitals  Temp 99.5 F (37.5 C)  Pulse Rate 92  Resp 16  SpO2 98 %  Bilateral Breath Sounds Diminished  MEWS Score/Color  MEWS Score 0  MEWS Score Color Green    "

## 2024-08-21 NOTE — Plan of Care (Signed)
   Problem: Clinical Measurements: Goal: Ability to maintain clinical measurements within normal limits will improve Outcome: Progressing

## 2024-08-22 ENCOUNTER — Inpatient Hospital Stay (HOSPITAL_COMMUNITY)

## 2024-08-22 ENCOUNTER — Ambulatory Visit

## 2024-08-22 DIAGNOSIS — J188 Other pneumonia, unspecified organism: Secondary | ICD-10-CM | POA: Diagnosis not present

## 2024-08-22 LAB — GLUCOSE, CAPILLARY
Glucose-Capillary: 106 mg/dL — ABNORMAL HIGH (ref 70–99)
Glucose-Capillary: 64 mg/dL — ABNORMAL LOW (ref 70–99)
Glucose-Capillary: 70 mg/dL (ref 70–99)
Glucose-Capillary: 73 mg/dL (ref 70–99)
Glucose-Capillary: 75 mg/dL (ref 70–99)
Glucose-Capillary: 76 mg/dL (ref 70–99)
Glucose-Capillary: 76 mg/dL (ref 70–99)
Glucose-Capillary: 76 mg/dL (ref 70–99)
Glucose-Capillary: 76 mg/dL (ref 70–99)
Glucose-Capillary: 76 mg/dL (ref 70–99)
Glucose-Capillary: 79 mg/dL (ref 70–99)
Glucose-Capillary: 82 mg/dL (ref 70–99)
Glucose-Capillary: 82 mg/dL (ref 70–99)
Glucose-Capillary: 85 mg/dL (ref 70–99)
Glucose-Capillary: 87 mg/dL (ref 70–99)
Glucose-Capillary: 94 mg/dL (ref 70–99)
Glucose-Capillary: 98 mg/dL (ref 70–99)

## 2024-08-22 LAB — COMPREHENSIVE METABOLIC PANEL WITH GFR
ALT: 27 U/L (ref 0–44)
AST: 34 U/L (ref 15–41)
Albumin: 3.3 g/dL — ABNORMAL LOW (ref 3.5–5.0)
Alkaline Phosphatase: 81 U/L (ref 38–126)
Anion gap: 9 (ref 5–15)
BUN: 13 mg/dL (ref 8–23)
CO2: 24 mmol/L (ref 22–32)
Calcium: 9.2 mg/dL (ref 8.9–10.3)
Chloride: 110 mmol/L (ref 98–111)
Creatinine, Ser: 1.19 mg/dL (ref 0.61–1.24)
GFR, Estimated: >60 mL/min
Glucose, Bld: 87 mg/dL (ref 70–99)
Potassium: 3.8 mmol/L (ref 3.5–5.1)
Sodium: 143 mmol/L (ref 135–145)
Total Bilirubin: 2.2 mg/dL — ABNORMAL HIGH (ref 0.0–1.2)
Total Protein: 6.4 g/dL — ABNORMAL LOW (ref 6.5–8.1)

## 2024-08-22 LAB — CBC
HCT: 37.3 % — ABNORMAL LOW (ref 39.0–52.0)
Hemoglobin: 12.2 g/dL — ABNORMAL LOW (ref 13.0–17.0)
MCH: 29.8 pg (ref 26.0–34.0)
MCHC: 32.7 g/dL (ref 30.0–36.0)
MCV: 91 fL (ref 80.0–100.0)
Platelets: 65 10*3/uL — ABNORMAL LOW (ref 150–400)
RBC: 4.1 MIL/uL — ABNORMAL LOW (ref 4.22–5.81)
RDW: 14.6 % (ref 11.5–15.5)
WBC: 4.3 10*3/uL (ref 4.0–10.5)
nRBC: 0 % (ref 0.0–0.2)

## 2024-08-22 LAB — AMMONIA: Ammonia: 50 umol/L — ABNORMAL HIGH (ref 9–35)

## 2024-08-22 MED ORDER — OSMOLITE 1.5 CAL PO LIQD
1000.0000 mL | ORAL | Status: AC
  Administered 2024-08-22: 1000 mL
  Filled 2024-08-22: qty 1000

## 2024-08-22 MED ORDER — PROSOURCE TF20 ENFIT COMPATIBL EN LIQD
60.0000 mL | Freq: Every day | ENTERAL | Status: DC
  Administered 2024-08-22 – 2024-08-29 (×8): 60 mL
  Filled 2024-08-22 (×9): qty 60

## 2024-08-22 MED ORDER — ORAL CARE MOUTH RINSE
15.0000 mL | OROMUCOSAL | Status: AC
  Administered 2024-08-22 – 2024-09-02 (×42): 15 mL via OROMUCOSAL

## 2024-08-22 MED ORDER — DEXTROSE 50 % IV SOLN
INTRAVENOUS | Status: AC
  Administered 2024-08-22: 12.5 g via INTRAVENOUS
  Filled 2024-08-22: qty 50

## 2024-08-22 MED ORDER — SORBITOL 70 % SOLN
30.0000 mL | Freq: Every day | Status: DC | PRN
  Administered 2024-08-22: 30 mL
  Filled 2024-08-22: qty 30

## 2024-08-22 MED ORDER — CARBIDOPA-LEVODOPA 25-100 MG PO TABS
1.0000 | ORAL_TABLET | ORAL | Status: DC
  Administered 2024-08-22 – 2024-08-28 (×18): 1
  Filled 2024-08-22 (×18): qty 1

## 2024-08-22 MED ORDER — DEXTROSE 50 % IV SOLN: 12.5000 g | Freq: Once | INTRAVENOUS | Status: AC

## 2024-08-22 MED ORDER — OSMOLITE 1.5 CAL PO LIQD
1000.0000 mL | ORAL | Status: DC
  Administered 2024-08-23 – 2024-08-25 (×6): 1000 mL
  Filled 2024-08-22 (×3): qty 1000

## 2024-08-22 MED ORDER — FUROSEMIDE 10 MG/ML IJ SOLN
40.0000 mg | Freq: Every day | INTRAMUSCULAR | Status: DC
  Administered 2024-08-22 – 2024-08-29 (×8): 40 mg via INTRAVENOUS
  Filled 2024-08-22 (×8): qty 4

## 2024-08-22 MED ORDER — ACETAMINOPHEN 325 MG PO TABS
650.0000 mg | ORAL_TABLET | Freq: Four times a day (QID) | ORAL | Status: DC | PRN
  Administered 2024-08-23 – 2024-08-24 (×3): 650 mg
  Filled 2024-08-22 (×3): qty 2

## 2024-08-22 MED ORDER — ACETAMINOPHEN 650 MG RE SUPP: 650.0000 mg | Freq: Four times a day (QID) | RECTAL | Status: DC | PRN

## 2024-08-22 NOTE — Progress Notes (Signed)
" °   08/22/24 2300  BiPAP/CPAP/SIPAP  BiPAP/CPAP/SIPAP Pt Type Adult  BiPAP/CPAP/SIPAP Resmed  Reason BIPAP/CPAP not in use Non-compliant (Patient does not want to wear his cpap tonight. RT advised to call if he changes his mind, machine in rm.)  BiPAP/CPAP /SiPAP Vitals  Temp 100.2 F (37.9 C)  Pulse Rate 79  Resp 12  BP 129/69  SpO2 96 %  MEWS Score/Color  MEWS Score 2  MEWS Score Color Yellow    "

## 2024-08-22 NOTE — Progress Notes (Addendum)
 " Progress Note   Patient: Richard Sandoval FMW:[id number] DOB: 1945/10/11 DOA: 08/19/2024     3 DOS: the patient was seen and examined on 08/22/2024    Brief hospital course: Richard Sandoval is a 79 y.o. male with history of Parkinson's disease, chronic HFrEF, sleep apnea, history of gallstone pancreatitis, history of PE/DVT on Xarelto  who was brought to the ER due to increasing weakness and confusion.  Recently treated for influenza with pneumonia.  On presentation, patient was confused, hypothermic.  CT chest abdomen pelvis showed multifocal pneumonia with concern for aspiration, with bronchitis.  Patient was admitted to the stepdown unit.  Started on [employer] Empiric antibiotics started. Patient remained lethargic. NG tube ordered 08/22/24.  Assessment and Plan:  Sepsis Present on admission, evidenced by lactic acidosis, transient hypotension, thrombocytopenia, leukopenia, acute encephalopathy in the setting of infection Sepsis resolving. - Continue antibiotics.  Multifocal pneumonia CT concerning for multifocal pneumonia/aspiration pneumonia/bronchitis. Flu and COVID-negative. Patient was started on cefepime , doxycycline , vancomycin . Vancomycin  discontinued due to negative MRSA screen. - Dced cefepime  on 1/25 due to encephalopathy, switched to Ceftriaxone  (unable to order Zosyn  as patient has a penicillin allergy). - Continue doxycycline . - Given concern for aspiration pneumonia, SLP evaluation ordered.  Still pending, given lethargy and patient's inability to participate.  Acute encephalopathy Likely septic encephalopathy due to infection and possibly exacerbated by Cefepime . MRI brain showed no acute findings.  Patient has moderate generalized parenchymal volume loss. - Continue to orient. - Delirium precautions. - Switched from cefepime  to ceftriaxone  as indicated above.  -NG tube placement 1/26 as patient remains lethargic and needs nutrition, oral medications  (Sinemet ). -Nutrition consult. -Check ammonia level  Hypothermia, resolved Due to sepsis. S/p Bair hugger (now discontinued)  TSH was 5.1, free T4 1.2, cortisol 13.6.  - Continue antibiotics for infection.  Pulmonary embolism, on Xarelto  at home. - Continue home Xarelto .  Parkinson's disease - Continue home Sinemet .  HFpEF TTE 06/27/24 with EF 55-60%, diastolic dyfunction Imaging shows bilateral pleural effusions. - Caution with IV fluids. -Diuresis with IV Lasix  beginning 1/26, given hypervolemia     Subjective: Patient still lethargic, but arousable.  He does respond and was noted to be smiling when spoken to.    Physical Exam: BP 133/70   Pulse 73   Temp 99.7 F (37.6 C)   Resp 16   Wt 134.3 kg   SpO2 98%   BMI 42.48 kg/m    General: Lethargic but responsive. Eyes: Pupils equal, reactive  Oral cavity: moist mucous membranes  Head: Atraumatic, normocephalic  Neck: supple  Chest: transmitted sounds CVS: S1,S2 RRR. No murmurs  Abd: No distention, soft, non-tender. No masses palpable  Extr: Pedal edema ++ MSK: No joint deformities or swelling  Neurological: Grossly intact.    Data Reviewed:    Latest Ref Rng & Units 08/22/2024    2:47 AM 08/21/2024    7:13 AM 08/20/2024    1:56 AM  CBC  WBC 4.0 - 10.5 K/uL 4.3  4.3  2.9   Hemoglobin 13.0 - 17.0 g/dL 87.7  87.3  87.8   Hematocrit 39.0 - 52.0 % 37.3  39.6  37.2   Platelets 150 - 400 K/uL 65  73  67       Latest Ref Rng & Units 08/22/2024    2:47 AM 08/21/2024    7:13 AM 08/20/2024    1:56 AM  BMP  Glucose 70 - 99 mg/dL 87  88  71   BUN 8 - 23 mg/dL  13  13  16    Creatinine 0.61 - 1.24 mg/dL 8.80  8.84  9.20   Sodium 135 - 145 mmol/L 143  140  142   Potassium 3.5 - 5.1 mmol/L 3.8  4.8  4.1   Chloride 98 - 111 mmol/L 110  109  109   CO2 22 - 32 mmol/L 24  22  25    Calcium  8.9 - 10.3 mg/dL 9.2  9.2  9.1      Family Communication: Spoke with wife on phone  Disposition: Status is: Inpatient Sepsis,  requiring IV antibiotics.   DVT ppx: Systemic anticoagulation with Xarelto .      Author: MDALA-GAUSI, Richard Carlini AGATHA, MD 08/22/2024 11:56 AM  For on call review [email or web address].    "

## 2024-08-22 NOTE — Progress Notes (Addendum)
 Initial Nutrition Assessment  DOCUMENTATION CODES:   Morbid obesity  INTERVENTION:  - Initiate tube feeding via Cortrak as below. Plan to run tube feeds during the day, only while patient OFF of BiPAP.   - Tonight: Osmolite 1.5 @ 66mL/hr until 2200.  - Tomorrow: Osmolite 1.5 at 90 ml/h x16 hours during the day (to run from 0600-2200) *Start at 67mL/hr and advance by 10mL Q8H (while tube feed running) Prosource TF20 60 ml daily Provides 2240 kcal, 110 gm protein, 1097 ml free water daily  - Monitor magnesium, potassium, and phosphorus daily for at least 3 days, MD to replete as needed, as pt may be at risk for refeeding syndrome.  - FWF per CCM.   - Monitor weight trends.   NUTRITION DIAGNOSIS:   Inadequate oral intake related to inability to eat, lethargy/confusion as evidenced by meal completion < 25%.  GOAL:   Patient will meet greater than or equal to 90% of their needs  MONITOR:   Diet advancement, Labs, Weight trends, TF tolerance  REASON FOR ASSESSMENT:   Consult Enteral/tube feeding initiation and management  ASSESSMENT:   79 y.o. male with PMH of Parkinson's disease, chronic HFrEF, sleep apnea, history of gallstone pancreatitis, history of PE/DVT on Xarelto  who presented due to increasing weakness and confusion. Admitted for sepsis, encephalopathy, and PNA.   1/23 Admit 1/24 SLP eval unable to be completed due to patient being lethargic 1/26 SLP eval unable to be completed due to patient being on BiPAP 1/27 Cortrak placed (tip in stomach)  RD working remotely. Patient noted to be lethargic this morning and per RN he is not able to verbally respond to questions. Unable to obtain nutrition history at this time.  Per chart review, weight history over the past year as below: 02/24/24 - 323# 05/30/24 - 327# 06/02/24 - 296# 08/19/24 - 296#  Suspect admit weight was copied over from weight in November. Will request a new weight.  Patient has been on a heart  healthy diet but no meal intakes documented. Per RN, patient has been too lethargic to eat.   Discussed with MD. Plan to meet 100% of needs with tube feeds.  Patient requires BiPAP at night. Discussed trying or a post-pyloric Cortrak to be able to feed. Per discussion with MD, concern for aspiration risk so MD wanting to only feed during the day. MD to time BiPAP for 2200-0600 so tube feeds can run 16 hours during the day. RN aware of plan.  Cortrak placed today, now xray verified in the stomach. Starting 50mL/hr today and ending tonight at 2200. New order tomorrow to stat at 0600 at 46mL/hr and advancing every 8 hours while patient off bipap. Goal is 28mL/hr x16 hours.    Medications reviewed and include: Sinemet , Lasix   Labs reviewed:  -  NUTRITION - FOCUSED PHYSICAL EXAM:  RD working remotely  Diet Order:   Diet Order             Diet Heart Room service appropriate? Yes; Fluid consistency: Thin  Diet effective now                   EDUCATION NEEDS:  Not appropriate for education at this time  Skin:  Skin Assessment: Reviewed RN Assessment  Last BM:  PTA  Height:  Ht Readings from Last 1 Encounters:  05/30/24 5' 10 (1.778 m)   Weight:  Wt Readings from Last 1 Encounters:  08/19/24 134.3 kg   Ideal Body Weight:  75.45  kg  BMI:  Body mass index is 42.48 kg/m.  Estimated Nutritional Needs:  Kcal:  2100-2300 kcals Protein:  105-120 grams Fluid:  >/= 2.1L    Trude Ned RD, LDN Contact via Secure Chat.

## 2024-08-22 NOTE — Progress Notes (Signed)
 SLP Cancellation Note  Patient Details Name: Fain Francis MRN: [id number] DOB: [birth year]   Cancelled treatment:       Reason Eval/Treat Not Completed: Medical issues which prohibited therapy - BIPAP   Gayl Ivanoff, Consuelo Fitch 08/22/2024, 10:37 AM

## 2024-08-22 NOTE — Plan of Care (Signed)
   Problem: Clinical Measurements: Goal: Will remain free from infection Outcome: Progressing   Problem: Clinical Measurements: Goal: Diagnostic test results will improve Outcome: Progressing   Problem: Clinical Measurements: Goal: Respiratory complications will improve Outcome: Progressing

## 2024-08-22 NOTE — Procedures (Signed)
 CORTRAK TUBE INSERTION   A 10 F Cortrak tube has been placed. The tube was secured at the left  nare at 63 cm by a member of the Cortrak tube team. The tube tip is in the stomach    X-ray is required, abdominal x-ray has been ordered. Please confirm tube placement before using the Cortrak tube.    If the tube becomes dislodged please keep the tube and contact [facility] for replacement.   Norleen JAYSON Fila, RN 08/22/2024 2:02 PM

## 2024-08-22 NOTE — TOC Initial Note (Signed)
 Transition of Care [facility]) - Initial/Assessment Note    Patient Details  Name: Richard Sandoval MRN: [id number] Date of Birth: 1946/03/29  Transition of Care [facility]) CM/SW Contact:    Jon ONEIDA Anon, RN Phone Number: 08/22/2024, 3:20 PM  Clinical Narrative:                 Pt is from home with spouse. Pt came into the ED with complaints of generalize weakness with some confusion. Pt admitted to the hospital with diagnosis of Multifocal pneumonia. Pt needing Bipap during admission. Pt being more lethargic, prompting placement of cortrak for nutritional supplement and medication administration. Pt needing continued medical workup, not medically stable for discharge. ICM will continue to follow for DC planning needs.    Expected Discharge Plan:  (TBD) Barriers to Discharge: Continued Medical Work up   Patient Goals and CMS Choice Patient states their goals for this hospitalization and ongoing recovery are:: Return home CMS Medicare.gov Compare Post Acute Care list provided to:: Patient Represenative (must comment) (Pt spouse) Choice offered to / list presented to : Spouse [place] ownership interest in [facility].provided to:: Spouse    Expected Discharge Plan and Services In-house Referral: NA Discharge Planning Services: CM Consult Post Acute Care Choice: Durable Medical Equipment Living arrangements for the past 2 months: Single Family Home                 DME Arranged: N/A DME Agency: NA       HH Arranged: NA HH Agency: NA        Prior Living Arrangements/Services Living arrangements for the past 2 months: Single Family Home Lives with:: Spouse Patient language and need for interpreter reviewed:: Yes Do you feel safe going back to the place where you live?: Yes      Need for Family Participation in Patient Care: Yes (Comment) Care giver support system in place?: Yes (comment) Current home services: DME (Walker, cane) Criminal Activity/Legal Involvement  Pertinent to Current Situation/Hospitalization: No - Comment as needed  Activities of Daily Living      Permission Sought/Granted Permission sought to share information with : Family Supports    Share Information with NAME: Izick, Gasbarro  [place],[phone]           Emotional Assessment Appearance:: Other (Comment Required Attitude/Demeanor/Rapport: Unable to Assess Affect (typically observed): Unable to Assess   Alcohol / Substance Use: Not Applicable Psych Involvement: No (comment)  Admission diagnosis:  Hypothermia, initial encounter [T68.XXXA] Sepsis (HCC) [A41.9] Altered mental status, unspecified altered mental status type [R41.82] Patient Active Problem List   Diagnosis Date Noted   Multifocal pneumonia 08/20/2024   Sepsis (HCC) 08/19/2024   Parkinson's disease (HCC) 08/19/2024   Hypothermia 08/19/2024   Acute metabolic encephalopathy 08/19/2024   History of pulmonary embolism 08/19/2024   Thrombocytopenia 08/19/2024   Chronic combined systolic and diastolic heart failure (HCC) 09/06/2021   CAD in native artery 09/06/2021   History of colonic polyps 03/04/2019   Lumbar stenosis with neurogenic claudication 12/07/2017   Right heart failure (HCC) 08/27/2017   Pulmonary hypertension (HCC) 06/04/2017   GERD (gastroesophageal reflux disease) 02/21/2016   Bilateral pulmonary embolism (HCC) 02/20/2016   Hypokalemia 01/25/2014   Pancreatitis, gallstone 01/25/2014   Morbid obesity (HCC)    Pancreatitis 01/05/2014   Abnormal transaminases 01/05/2014   Dyspnea 12/15/2013   Upper airway cough syndrome 09/27/2013   Cellulitis and abscess 05/10/2012   Morbid (severe) obesity due to excess calories (HCC) 09/24/2006   DEPRESSIVE  DISORDER, NOS 09/24/2006   BACK PAIN, LOW 09/24/2006   OSA (obstructive sleep apnea) 09/24/2006   PCP:  [name], MD Pharmacy:   [facility] [phone], [place] - [phone] AT [facility] OF [address]  [place] [address] Phone: [phone] Fax: [phone]     Social Drivers of Health (SDOH) Social History: SDOH Screenings   Food Insecurity: Patient Unable To Answer (08/20/2024)  Housing: Patient Unable To Answer (08/20/2024)  Transportation Needs: Patient Unable To Answer (08/20/2024)  Utilities: Patient Unable To Answer (08/20/2024)  Social Connections: Patient Unable To Answer (08/20/2024)  Tobacco Use: Low Risk (08/19/2024)   SDOH Interventions: Housing Interventions: Patient Unable to Answer Utilities Interventions: Patient Unable to Answer Social Connections Interventions: Patient Unable to Answer   Readmission Risk Interventions    08/22/2024    3:18 PM  Readmission Risk Prevention Plan  Post Dischage Appt Complete  Medication Screening Complete  Transportation Screening Complete

## 2024-08-23 DIAGNOSIS — J188 Other pneumonia, unspecified organism: Secondary | ICD-10-CM | POA: Diagnosis not present

## 2024-08-23 LAB — COMPREHENSIVE METABOLIC PANEL WITH GFR
ALT: 16 U/L (ref 0–44)
AST: 32 U/L (ref 15–41)
Albumin: 3.4 g/dL — ABNORMAL LOW (ref 3.5–5.0)
Alkaline Phosphatase: 85 U/L (ref 38–126)
Anion gap: 11 (ref 5–15)
BUN: 18 mg/dL (ref 8–23)
CO2: 26 mmol/L (ref 22–32)
Calcium: 9.1 mg/dL (ref 8.9–10.3)
Chloride: 106 mmol/L (ref 98–111)
Creatinine, Ser: 1.19 mg/dL (ref 0.61–1.24)
GFR, Estimated: >60 mL/min
Glucose, Bld: 74 mg/dL (ref 70–99)
Potassium: 3.8 mmol/L (ref 3.5–5.1)
Sodium: 143 mmol/L (ref 135–145)
Total Bilirubin: 2.7 mg/dL — ABNORMAL HIGH (ref 0.0–1.2)
Total Protein: 6.7 g/dL (ref 6.5–8.1)

## 2024-08-23 LAB — LIPASE, BLOOD: Lipase: 13 U/L (ref 11–51)

## 2024-08-23 LAB — CBC
HCT: 39.9 % (ref 39.0–52.0)
Hemoglobin: 12.7 g/dL — ABNORMAL LOW (ref 13.0–17.0)
MCH: 29.4 pg (ref 26.0–34.0)
MCHC: 31.8 g/dL (ref 30.0–36.0)
MCV: 92.4 fL (ref 80.0–100.0)
Platelets: 74 10*3/uL — ABNORMAL LOW (ref 150–400)
RBC: 4.32 MIL/uL (ref 4.22–5.81)
RDW: 14.5 % (ref 11.5–15.5)
WBC: 4.7 10*3/uL (ref 4.0–10.5)
nRBC: 0 % (ref 0.0–0.2)

## 2024-08-23 LAB — GLUCOSE, CAPILLARY
Glucose-Capillary: 75 mg/dL (ref 70–99)
Glucose-Capillary: 88 mg/dL (ref 70–99)
Glucose-Capillary: 94 mg/dL (ref 70–99)
Glucose-Capillary: 106 mg/dL — ABNORMAL HIGH (ref 70–99)
Glucose-Capillary: 89 mg/dL (ref 70–99)

## 2024-08-23 LAB — RESP PANEL BY RT-PCR (RSV, FLU A&B, COVID)  RVPGX2
Influenza A by PCR: NEGATIVE
Influenza B by PCR: NEGATIVE
Resp Syncytial Virus by PCR: NEGATIVE
SARS Coronavirus 2 by RT PCR: NEGATIVE

## 2024-08-23 LAB — PHOSPHORUS: Phosphorus: 2.4 mg/dL — ABNORMAL LOW (ref 2.5–4.6)

## 2024-08-23 LAB — MAGNESIUM: Magnesium: 1.8 mg/dL (ref 1.7–2.4)

## 2024-08-23 MED ORDER — RIVAROXABAN 20 MG PO TABS
20.0000 mg | ORAL_TABLET | Freq: Every day | ORAL | Status: DC
  Administered 2024-08-23 – 2024-08-27 (×5): 20 mg
  Filled 2024-08-23 (×5): qty 1

## 2024-08-23 MED ORDER — MELATONIN 3 MG PO TABS
3.0000 mg | ORAL_TABLET | Freq: Every evening | ORAL | Status: DC | PRN
  Administered 2024-08-24: 3 mg via ORAL
  Filled 2024-08-23: qty 1

## 2024-08-23 NOTE — Progress Notes (Signed)
 SLP Cancellation Note  Patient Details Name: Richard Sandoval MRN: [id number] DOB: [birth year]   Cancelled treatment:       Reason Eval/Treat Not Completed: Patient not medically ready. Still too lethargic for oral intake, discussed with RN   Kayn Haymore, Consuelo Fitch 08/23/2024, 11:58 AM

## 2024-08-23 NOTE — Progress Notes (Addendum)
 " Progress Note   Patient: Richard Sandoval FMW:[id number] DOB: Nov 30, 1945 DOA: 08/19/2024     4 DOS: the patient was seen and examined on 08/23/2024    Brief hospital course: Richard Sandoval is a 79 y.o. male with history of Parkinson's disease, chronic HFrEF, sleep apnea, history of gallstone pancreatitis, history of PE/DVT on Xarelto  who was brought to the ER due to increasing weakness and confusion.  Recently treated for influenza with pneumonia.  On presentation, patient was confused, hypothermic.  CT chest abdomen pelvis showed multifocal pneumonia with concern for aspiration, with bronchitis.  Patient was admitted to the stepdown unit.  Started on [employer] Empiric antibiotics started. Patient remained lethargic. NG tube ordered 08/22/24.  Assessment and Plan:  Severe sepsis with no shock Present on admission, evidenced by lactic acidosis, transient hypotension, thrombocytopenia, leukopenia, acute encephalopathy in the setting of infection Sepsis resolving. - Continue antibiotics.  Multifocal pneumonia CT concerning for multifocal pneumonia/aspiration pneumonia/bronchitis. Flu and COVID-negative. Patient was started on cefepime , doxycycline , vancomycin . Vancomycin  discontinued due to negative MRSA screen. - Dced cefepime  on 1/25 due to encephalopathy, switched to Ceftriaxone  (unable to order Zosyn  as patient has a penicillin allergy). - Continue doxycycline . - Continue antibiotics till 1/30 to complete a 7-day course.  - Given concern for aspiration pneumonia, SLP evaluation ordered.  Still pending, given lethargy and patient's inability to participate.  New low-grade fevers Patient noted to have low-grade fever on 1/27. White count remains stable. - Blood cultures. - Repeat resp panel (Influenza, RSV, COVID)  Hyperbilirubinemia Unclear etiology. May be due to sepsis.  - will continue to trend.   Acute encephalopathy Likely septic encephalopathy due to infection and possibly  exacerbated by Cefepime . MRI brain showed no acute findings.  Patient has moderate generalized parenchymal volume loss. Ammonia level 50. - Continue to orient. - Delirium precautions. - Switched from cefepime  to ceftriaxone  as indicated above.   Moderate protein calorie malnutrition -NG tube placement 1/26 as patient remains lethargic and needs nutrition, oral medications (Sinemet ). -Nutrition consulted, and patient has been started on tube feeds.   Hypothermia, resolved Due to sepsis. S/p Bair hugger (now discontinued)  TSH was 5.1, free T4 1.2, cortisol 13.6.  - Continue antibiotics for infection.  History of pulmonary embolism, on Xarelto  at home. - Continue home Xarelto .  Parkinson's disease - Continue home Sinemet .  HFpEF TTE 06/27/24 with EF 55-60%, diastolic dyfunction Imaging shows bilateral pleural effusions. - Caution with IV fluids. -Diuresis with IV Lasix  beginning 1/26, given hypervolemia     Subjective: Patient still lethargic, but a little more alert today.    Physical Exam: BP (!) 103/56   Pulse (!) 103   Temp 99.9 F (37.7 C)   Resp 17   Wt 134.3 kg   SpO2 97%   BMI 42.48 kg/m    General: Lethargic but responsive. Eyes: Pupils equal, reactive  Oral cavity: moist mucous membranes  Head: Atraumatic, normocephalic  Neck: supple  Chest: basal crackles CVS: S1,S2 RRR. No murmurs  Abd: No distention, soft, non-tender. No masses palpable  Extr: Pedal edema ++ MSK: No joint deformities or swelling  Neurological: Grossly intact.    Data Reviewed:    Latest Ref Rng & Units 08/23/2024    3:21 AM 08/22/2024    2:47 AM 08/21/2024    7:13 AM  CBC  WBC 4.0 - 10.5 K/uL 4.7  4.3  4.3   Hemoglobin 13.0 - 17.0 g/dL 87.2  87.7  87.3   Hematocrit 39.0 - 52.0 %  39.9  37.3  39.6   Platelets 150 - 400 K/uL 74  65  73       Latest Ref Rng & Units 08/23/2024    3:21 AM 08/22/2024    2:47 AM 08/21/2024    7:13 AM  BMP  Glucose 70 - 99 mg/dL 74  87  88   BUN  8 - 23 mg/dL 18  13  13    Creatinine 0.61 - 1.24 mg/dL 8.80  8.80  8.84   Sodium 135 - 145 mmol/L 143  143  140   Potassium 3.5 - 5.1 mmol/L 3.8  3.8  4.8   Chloride 98 - 111 mmol/L 106  110  109   CO2 22 - 32 mmol/L 26  24  22    Calcium  8.9 - 10.3 mg/dL 9.1  9.2  9.2      Family Communication: Spoke with daughter at bedside.   Disposition: Status is: Inpatient Sepsis, requiring IV antibiotics.   DVT ppx: Systemic anticoagulation with Xarelto .      Author: MDALA-GAUSI, Richard PILLOW, MD 08/23/2024 3:33 PM  For on call review [email or web address].    "

## 2024-08-23 NOTE — Progress Notes (Signed)
" °   08/23/24 0000  BiPAP/CPAP/SIPAP  BiPAP/CPAP/SIPAP Pt Type Adult  BiPAP/CPAP/SIPAP Resmed  Mask Type Full face mask  Mask Size Large  PEEP 10 cmH20  FiO2 (%) 21 %  Patient Home Machine No  Patient Home Mask No  Patient Home Tubing No  Auto Titrate No  Nasal massage performed Yes  CPAP/SIPAP surface wiped down Yes  Device Plugged into RED Power Outlet Yes  BiPAP/CPAP /SiPAP Vitals  Temp 100.2 F (37.9 C)  Pulse Rate 92  Resp 15  BP (!) 138/116  SpO2 97 %  MEWS Score/Color  MEWS Score 1  MEWS Score Color Green   RT called to pt rm- decided to go on CPAP "

## 2024-08-24 ENCOUNTER — Inpatient Hospital Stay (HOSPITAL_COMMUNITY)

## 2024-08-24 ENCOUNTER — Ambulatory Visit

## 2024-08-24 ENCOUNTER — Inpatient Hospital Stay (HOSPITAL_COMMUNITY): Admit: 2024-08-24 | Discharge: 2024-08-24 | Disposition: A | Attending: Neurology

## 2024-08-24 DIAGNOSIS — R569 Unspecified convulsions: Secondary | ICD-10-CM

## 2024-08-24 DIAGNOSIS — A419 Sepsis, unspecified organism: Secondary | ICD-10-CM | POA: Diagnosis not present

## 2024-08-24 DIAGNOSIS — J188 Other pneumonia, unspecified organism: Secondary | ICD-10-CM | POA: Diagnosis not present

## 2024-08-24 DIAGNOSIS — R4182 Altered mental status, unspecified: Secondary | ICD-10-CM | POA: Diagnosis not present

## 2024-08-24 DIAGNOSIS — T361X5A Adverse effect of cephalosporins and other beta-lactam antibiotics, initial encounter: Secondary | ICD-10-CM

## 2024-08-24 DIAGNOSIS — G9340 Encephalopathy, unspecified: Secondary | ICD-10-CM

## 2024-08-24 DIAGNOSIS — G20A1 Parkinson's disease without dyskinesia, without mention of fluctuations: Secondary | ICD-10-CM | POA: Diagnosis not present

## 2024-08-24 LAB — GLUCOSE, CAPILLARY
Glucose-Capillary: 105 mg/dL — ABNORMAL HIGH (ref 70–99)
Glucose-Capillary: 137 mg/dL — ABNORMAL HIGH (ref 70–99)
Glucose-Capillary: 65 mg/dL — ABNORMAL LOW (ref 70–99)
Glucose-Capillary: 85 mg/dL (ref 70–99)
Glucose-Capillary: 85 mg/dL (ref 70–99)
Glucose-Capillary: 90 mg/dL (ref 70–99)
Glucose-Capillary: 96 mg/dL (ref 70–99)
Glucose-Capillary: 97 mg/dL (ref 70–99)

## 2024-08-24 LAB — COMPREHENSIVE METABOLIC PANEL WITH GFR
ALT: 13 U/L (ref 0–44)
AST: 28 U/L (ref 15–41)
Albumin: 3.1 g/dL — ABNORMAL LOW (ref 3.5–5.0)
Alkaline Phosphatase: 77 U/L (ref 38–126)
Anion gap: 10 (ref 5–15)
BUN: 22 mg/dL (ref 8–23)
CO2: 26 mmol/L (ref 22–32)
Calcium: 8.8 mg/dL — ABNORMAL LOW (ref 8.9–10.3)
Chloride: 105 mmol/L (ref 98–111)
Creatinine, Ser: 1.06 mg/dL (ref 0.61–1.24)
GFR, Estimated: >60 mL/min
Glucose, Bld: 103 mg/dL — ABNORMAL HIGH (ref 70–99)
Potassium: 3.7 mmol/L (ref 3.5–5.1)
Sodium: 141 mmol/L (ref 135–145)
Total Bilirubin: 2.1 mg/dL — ABNORMAL HIGH (ref 0.0–1.2)
Total Protein: 6.3 g/dL — ABNORMAL LOW (ref 6.5–8.1)

## 2024-08-24 LAB — CULTURE, BLOOD (SINGLE)
Culture: NO GROWTH
Special Requests: ADEQUATE

## 2024-08-24 LAB — CBC
HCT: 37.8 % — ABNORMAL LOW (ref 39.0–52.0)
Hemoglobin: 12.4 g/dL — ABNORMAL LOW (ref 13.0–17.0)
MCH: 29.6 pg (ref 26.0–34.0)
MCHC: 32.8 g/dL (ref 30.0–36.0)
MCV: 90.2 fL (ref 80.0–100.0)
Platelets: 69 10*3/uL — ABNORMAL LOW (ref 150–400)
RBC: 4.19 MIL/uL — ABNORMAL LOW (ref 4.22–5.81)
RDW: 14.4 % (ref 11.5–15.5)
WBC: 4.8 10*3/uL (ref 4.0–10.5)
nRBC: 0 % (ref 0.0–0.2)

## 2024-08-24 LAB — PHOSPHORUS: Phosphorus: 2.4 mg/dL — ABNORMAL LOW (ref 2.5–4.6)

## 2024-08-24 LAB — MAGNESIUM: Magnesium: 1.9 mg/dL (ref 1.7–2.4)

## 2024-08-24 MED ORDER — POTASSIUM & SODIUM PHOSPHATES 280-160-250 MG PO PACK
1.0000 | PACK | Freq: Three times a day (TID) | ORAL | Status: AC
  Administered 2024-08-24 – 2024-08-25 (×6): 1 via NASOGASTRIC
  Filled 2024-08-24 (×6): qty 1

## 2024-08-24 MED ORDER — DEXTROSE 50 % IV SOLN
12.5000 g | INTRAVENOUS | Status: AC
  Administered 2024-08-24: 12.5 g via INTRAVENOUS
  Filled 2024-08-24: qty 50

## 2024-08-24 MED ORDER — AZITHROMYCIN 250 MG PO TABS
500.0000 mg | ORAL_TABLET | ORAL | Status: AC
  Administered 2024-08-24 – 2024-08-25 (×2): 500 mg
  Filled 2024-08-24 (×2): qty 2

## 2024-08-24 NOTE — Progress Notes (Signed)
 EEG complete - results pending

## 2024-08-24 NOTE — Progress Notes (Signed)
 " Progress Note   Patient: Richard Sandoval FMW:[id number] DOB: 1946/02/02 DOA: 08/19/2024     5 DOS: the patient was seen and examined on 08/24/2024    Brief hospital course: [name] is a 79 y.o. male with history of Parkinson's disease, chronic HFrEF, sleep apnea, history of gallstone pancreatitis, history of PE/DVT on Xarelto  who was brought to the ER due to increasing weakness and confusion.  Recently treated for influenza with pneumonia.  On presentation, patient was confused, hypothermic.  CT chest abdomen pelvis showed multifocal pneumonia with concern for aspiration, with bronchitis.  Patient was admitted to the stepdown unit.  Started on [employer] Empiric antibiotics started. Patient remained lethargic. NG tube ordered 08/22/24 and patient started on tube feeds.  Patient remained lethargic and neurology was consulted on 08/24/2024.  Assessment and Plan:  Severe sepsis with no shock Present on admission, evidenced by lactic acidosis, transient hypotension, thrombocytopenia, leukopenia, acute encephalopathy in the setting of infection Sepsis resolving. - Continue antibiotics.  Multifocal pneumonia CT concerning for multifocal pneumonia/aspiration pneumonia/bronchitis. Flu and COVID-negative. Patient was started on cefepime , doxycycline , vancomycin . Vancomycin  discontinued due to negative MRSA screen. - Dced cefepime  on 1/25 due to encephalopathy, switched to Ceftriaxone  (unable to order Zosyn  as patient has a penicillin allergy). - Continue doxycycline . - Continue antibiotics till 1/30 to complete a 7-day course.  - Given concern for aspiration pneumonia, SLP evaluation ordered.  Still pending, given lethargy and patient's inability to participate.  Acute encephalopathy Likely septic encephalopathy due to infection and possibly exacerbated by Cefepime . MRI brain showed no acute findings.  Patient has moderate generalized parenchymal volume loss. Ammonia level 50. Patient  remains lethargic/encephalopathic despite antibiotic therapy and discontinuation of cefepime . - Neurology consulted on 08/24/2024. - Continue to orient. - Delirium precautions.  New low-grade fevers Patient noted to have low-grade fever on 1/27. White count remains stable. Blood cultures repeated.  No growth to date. Respiratory panel negative for influenza, RSV, COVID. - Will continue current antibiotics. - Continue to monitor.  Hyperbilirubinemia Likely due to sepsis - will continue to trend.   HFpEF TTE 06/27/24 with EF 55-60%, diastolic dyfunction Imaging shows bilateral pleural effusions. - Caution with IV fluids. -Diuresis with IV Lasix  beginning 1/26, given hypervolemia  Moderate protein calorie malnutrition -NG tube placement 1/26 as patient remains lethargic and needs nutrition, oral medications (Sinemet ). -Nutrition consulted, and patient has been started on tube feeds.   Hypophosphatemia - Replete phosphate as needed.  Hypothermia, resolved Due to sepsis. S/p Bair hugger (now discontinued)  TSH was 5.1, free T4 1.2, cortisol 13.6.  - Continue antibiotics for infection.  History of pulmonary embolism, on Xarelto  at home. - Continue home Xarelto .  Parkinson's disease - Continue home Sinemet .     Subjective: Patient was reportedly very restless overnight.  He remains lethargic this morning.  Diuresing well in response to Lasix .  Last BM 1/27.   Physical Exam: BP (!) 111/54   Pulse 68   Temp 99.1 F (37.3 C)   Resp 12   Wt (!) 147.8 kg   SpO2 94%   BMI 46.75 kg/m    General: Lethargic  Eyes: Pupils equal, reactive  Oral cavity: moist mucous membranes  Head: Atraumatic, normocephalic  Neck: supple  Chest: basal crackles CVS: S1,S2 RRR. No murmurs  Abd: No distention, soft, non-tender. No masses palpable  Extr: Pedal edema ++ MSK: No joint deformities or swelling  Neurological: Grossly intact.    Data Reviewed:    Latest Ref Rng & Units  08/24/2024    3:10 AM 08/23/2024    3:21 AM 08/22/2024    2:47 AM  CBC  WBC 4.0 - 10.5 K/uL 4.8  4.7  4.3   Hemoglobin 13.0 - 17.0 g/dL 87.5  87.2  87.7   Hematocrit 39.0 - 52.0 % 37.8  39.9  37.3   Platelets 150 - 400 K/uL 69  74  65       Latest Ref Rng & Units 08/24/2024    3:10 AM 08/23/2024    3:21 AM 08/22/2024    2:47 AM  BMP  Glucose 70 - 99 mg/dL 896  74  87   BUN 8 - 23 mg/dL 22  18  13    Creatinine 0.61 - 1.24 mg/dL 8.93  8.80  8.80   Sodium 135 - 145 mmol/L 141  143  143   Potassium 3.5 - 5.1 mmol/L 3.7  3.8  3.8   Chloride 98 - 111 mmol/L 105  106  110   CO2 22 - 32 mmol/L 26  26  24    Calcium  8.9 - 10.3 mg/dL 8.8  9.1  9.2      Family Communication: Spoke with wife and daughter at bedside.   Disposition: Status is: Inpatient Sepsis, requiring IV antibiotics; encephalopathy  DVT ppx: Systemic anticoagulation with Xarelto .      Author: MDALA-GAUSI, Sway Guttierrez AGATHA, MD 08/24/2024 11:11 AM  For on call review [email or web address].    "

## 2024-08-24 NOTE — Progress Notes (Signed)
" °   08/24/24 2245  BiPAP/CPAP/SIPAP  BiPAP/CPAP/SIPAP Pt Type Adult  Reason BIPAP/CPAP not in use Non-compliant (Patient continues to refuse CPAP qhs.  Machine remains in room should he change his mind.)    "

## 2024-08-24 NOTE — Progress Notes (Signed)
 PT Cancellation Note  Patient Details Name: Richard Sandoval MRN: [id number] DOB: August 07, 1945   Cancelled Treatment:    Reason Eval/Treat Not Completed: Patient at procedure or test/unavailable (Pt at [facility] Will follow.)   Sylvan Delon Copp PT 08/24/2024  Acute Rehabilitation Services  Office [phone]

## 2024-08-24 NOTE — Progress Notes (Signed)
" °   08/24/24 0000  BiPAP/CPAP/SIPAP  BiPAP/CPAP/SIPAP Pt Type Adult  BiPAP/CPAP/SIPAP Resmed  Reason BIPAP/CPAP not in use Non-compliant (Pt refusing, in rm if pt change mind)  BiPAP/CPAP /SiPAP Vitals  Temp 99.9 F (37.7 C)  Pulse Rate 81  Resp 18  BP (!) 148/89  SpO2 98 %  MEWS Score/Color  MEWS Score 1  MEWS Score Color Green    "

## 2024-08-24 NOTE — Progress Notes (Signed)
 0016 Pt CBG was 65 started hypoglycemia protocol then gave 25 mL of dex rechecked the pt at 0051 CBG was 137

## 2024-08-24 NOTE — Plan of Care (Signed)
 EEG no seizure seen and consistent with encephalopathy. However, pt EKG concerning for Afib, which would be a new diagnosis. Although 1/23 MRI negative for stroke, pt was off Xarelto  for a couple of days, and AMS no significant improvement after Abx treatment per daughter, will recommend to have a repeat limited MRI to rule out stroke. I will order.   Ary Cummins, MD PhD Stroke Neurology 08/24/2024 5:00 PM

## 2024-08-24 NOTE — Procedures (Signed)
 Patient Name: Richard Sandoval  MRN: [id number]  Epilepsy Attending: Arlin MALVA Krebs  Referring Physician/Provider: Jerri Pfeiffer, MD  Date: 08/24/2024  Duration: 23.02 mins  Patient history: 79yo M with ams. EEG to evaluate for seizure  Level of alertness: Awake  AEDs during EEG study: None  Technical aspects: This EEG study was done with scalp electrodes positioned according to the 10-20 International system of electrode placement. Electrical activity was reviewed with band pass filter of 1-70Hz , sensitivity of 7 uV/mm, display speed of 82mm/sec with a 60Hz  notched filter applied as appropriate. EEG data were recorded continuously and digitally stored.  Video monitoring was available and reviewed as appropriate.  Description: EEG showed continuous generalized predominantly  5 to 7 Hz theta slowing admixed with intermittent 2-3hz  delta slowing. Hyperventilation and photic stimulation were not performed.     ABNORMALITY - Continuous slow, generalized  IMPRESSION: This study is suggestive of generalized cerebral dysfunction (encephalopathy). No seizures or epileptiform discharges were seen throughout the recording.  [name]

## 2024-08-24 NOTE — Consult Note (Signed)
 Neurology Consultation Note  Consult Requested by: Dr. Jearlean  Reason for Consult: encephalopathy  Consult Date: 08/24/24   The history was obtained from the daughter and chart.  During history and examination, all items were able to obtain unless otherwise noted.  History of Present Illness:  Richard Sandoval is a 79 y.o. African American male with PMH of PD on sinemet , CHF, sleep apnea, history of gallstone pancreatitis, history of PE/DVT on Xarelto  admitted for confusion, hypothermic. Found to have multifocal pneumonia, sepsis and encephalopathy. Negative flu or COVID. Blood culture neg so far. He was treated with cefepime  and fleeta initially but changed to rocephin  and doxycycline  on 1/25. However, per daughter, pt mental status fluctuate, sometimes confused with speech not making sense, and sometimes lethargic sleepy and hard to wake up. MRI negative for acute finding on 1/23. Last night, he was agitated and restless and not sleeping well. Mental status not much improvement over time.   Per daughter, she lives in [place]  and pt was cognitive sound at least back in 12/2023 when pt drove to [place] . But she interacted with pt for the last 2 months noted some cognitive changes, especially 2 weeks before the current admission, pt seems worsened on cognition.  Pt does have probably PD, diagnosed 01/2024 and following with Dr. [name] at [facility], now on sinemet .  Last follow up on 06/12/24 pt was AAO x 3.    Past Medical History:  Diagnosis Date   Acute on chronic combined systolic and diastolic CHF (congestive heart failure) (HCC) 09/06/2021   CAD in native artery 09/06/2021   Chronic combined systolic and diastolic heart failure (HCC) 09/06/2021   DVT (deep venous thrombosis) (HCC)    L leg in 2009   Dyspnea    GERD (gastroesophageal reflux disease)    Hypertension    enlarged heart   Morbid obesity (HCC)    OSA (obstructive sleep apnea)    on CPAP since 1995   Pancreatitis    Pulmonary  hypertension (HCC) 06/04/2017   Right heart failure (HCC) 08/27/2017    Past Surgical History:  Procedure Laterality Date   BACK SURGERY  04/09/1999   cervical laminectomy and fusion     CHOLECYSTECTOMY N/A 01/25/2014   Procedure: LAPAROSCOPIC CHOLECYSTECTOMY WITH INTRAOPERATIVE CHOLANGIOGRAM;  Surgeon: Krystal CHRISTELLA Spinner, MD;  Location: [facility];  Service: General;  Laterality: N/A;   COLONOSCOPY     COLONOSCOPY WITH PROPOFOL  N/A 03/04/2019   Procedure: COLONOSCOPY WITH PROPOFOL ;  Surgeon: Dianna Specking, MD;  Location: [facility];  Service: Endoscopy;  Laterality: N/A;   CORONARY PRESSURE/FFR STUDY N/A 09/11/2021   Procedure: INTRAVASCULAR PRESSURE WIRE/FFR STUDY;  Surgeon: Wendel Lurena POUR, MD;  Location: [facility];  Service: Cardiovascular;  Laterality: N/A;   EUS N/A 01/24/2014   Procedure: UPPER ENDOSCOPIC ULTRASOUND (EUS) RADIAL;  Surgeon: Elsie Cree, MD;  Location: [facility];  Service: Endoscopy;  Laterality: N/A;   KNEE SURGERY  1998   left inguinal hernia     LUMBAR LAMINECTOMY     RIGHT/LEFT HEART CATH AND CORONARY ANGIOGRAPHY N/A 09/11/2021   Procedure: RIGHT/LEFT HEART CATH AND CORONARY ANGIOGRAPHY;  Surgeon: Wendel Lurena POUR, MD;  Location: [facility];  Service: Cardiovascular;  Laterality: N/A;   TRIGGER FINGER RELEASE  08/04/2012   Procedure: MINOR RELEASE TRIGGER FINGER/A-1 PULLEY;  Surgeon: Arley JONELLE Curia, MD;  Location: [facility];  Service: Orthopedics;  Laterality: Right;    Family History  Problem Relation Age of Onset   Breast  cancer Mother    Gout Father    Hypertension Father    Diabetes Sister    Heart disease Brother     Social History:  reports that he has never smoked. He has been exposed to tobacco smoke. He has never used smokeless tobacco. He reports that he does not drink alcohol and does not use drugs.  Allergies: Allergies[1]  Medications Ordered Prior to Encounter[2]  Review of Systems: A full ROS was attempted today  and was able to be performed.  Systems assessed include - Constitutional, Eyes, HENT, Respiratory, Cardiovascular, Gastrointestinal, Genitourinary, Integument/breast, Hematologic/lymphatic, Musculoskeletal, Neurological, Behavioral/Psych, Endocrine, Allergic/Immunologic - with pertinent responses as per HPI.  Physical Examination: Temp:  [98.8 F (37.1 C)-100.2 F (37.9 C)] 99.1 F (37.3 C) (01/28 1100) Pulse Rate:  [51-103] 68 (01/28 1100) Resp:  [7-22] 12 (01/28 1100) BP: (91-151)/(48-89) 111/54 (01/28 1100) SpO2:  [93 %-98 %] 94 % (01/28 1100) Weight:  [147.8 kg] 147.8 kg (01/28 0500)  General - well nourished, well developed, lethargic and sleepy.    Ophthalmologic - fundi not visualized due to noncooperation.    Cardiovascular - irregular heart rhythm  Neuro - drowsy sleepy and lethargic but eyes open on voice, able to maintain eye opening with repetitive stimulation. Orientated to self and DOB, but not to place, people, age or time. No aphasia, able to say short sentences with moderate dysarthria, following most simple commands. Able to name 1/2 and repeat 5 word simple sentence. No gaze palsy, tracking bilaterally, blinking to visual threat bilaterally. No facial droop. Tongue midline. Bilateral UEs 3-/5, bilaterally LEs 2/5, symmetric. Sensation, coordination not cooperative and gait not tested.    Data Reviewed: DG Abd 1 View Result Date: 08/22/2024 EXAM: 1 VIEW XRAY OF THE ABDOMEN 08/22/2024 12:21:00 PM COMPARISON: CT abdomen and pelvis 08/19/2024. CLINICAL HISTORY: 79 year old male. Encounter for nasogastric tube placement. FINDINGS: LINES, TUBES AND DEVICES: Enteric tube in place with tip projecting over the left upper quadrant, likely in the gastric body. BOWEL: Nonobstructive visible bowel gas pattern. SOFT TISSUES: Chronic cholecystectomy clips. No abnormal calcifications. BONES: Lumbar surgical hardware noted. No acute fracture. LUNG BASES: Patchy opacities in bilateral lung  bases. Lower lung opacity not significantly changed. IMPRESSION: 1. Feeding tube tube tip in the gastric body. 2. Stable patchy lung opacities. Electronically signed by: Helayne Hurst MD 08/22/2024 12:37 PM EST RP Workstation: [id number]   CT CHEST ABDOMEN PELVIS W CONTRAST Result Date: 08/19/2024 EXAM: CT CHEST WITH CONTRAST 08/19/2024 09:00:00 PM TECHNIQUE: CT of the chest was performed with the administration of 100 mL of iohexol  (OMNIPAQUE ) 300 MG/ML solution. Multiplanar reformatted images are provided for review. Automated exposure control, iterative reconstruction, and/or weight based adjustment of the mA/kV was utilized to reduce the radiation dose to as low as reasonably achievable. COMPARISON: CT abdomen and pelvis 12/14/2023 and cardiac CT 08/01/2021. CLINICAL HISTORY: Sepsis; purulence in skin folds of groin. FINDINGS: MEDIASTINUM: Heart is unremarkable. No pericardial effusions. Calcification of the aorta and coronary arteries. No aortic aneurysm. The central airways are clear. The esophagus is decompressed. The thyroid  gland is unremarkable. LYMPH NODES: Mediastinal lymph nodes are not pathologically enlarged. No hilar or axillary lymphadenopathy. No significant pelvic or groin lymphadenopathy. LUNGS AND PLEURA: Moderate bilateral pleural effusions. Atelectasis in the lung bases. Multiple focal areas of ground glass infiltration throughout both lungs with a mostly perihilar distribution. Air bronchograms are present. This is most likely to represent multifocal pneumonia or aspiration. Bronchial wall thickening consistent with bronchitis. No pneumothorax. SOFT TISSUES/BONES: Degenerative  changes in the spine and shoulders. Postoperative changes with lumbar laminectomies and posterior fixation hardware. Anterior fixation of the lower cervical spine. No acute bony abnormalities. No acute abnormality of the soft tissues. UPPER ABDOMEN: Surgical absence of the gallbladder. No bile duct dilatation. Fatty  infiltration of the pancreas with mild peripancreatic stranding, possibly early acute pancreatitis. No loculated collection. Spleen and adrenal glands are normal. Kidneys, ureters, and the bladder are normal. Prostate gland is not enlarged. Small scrotal hydroceles. Moderate-sized periumbilical hernia containing fat. No fat necrosis. Mild edema in the dependent abdominal and pelvic soft tissues. No loculated collections. The small bowel and colon are mostly decompressed. No inflammatory changes. The appendix is not visualized. Calcification of the abdominal aorta without aneurysm. IMPRESSION: 1. Multiple focal areas of ground glass infiltration throughout both lungs with a mostly perihilar distribution, likely representing multifocal pneumonia or aspiration, with associated bronchial wall thickening consistent with bronchitis. 2. Fatty infiltration of the pancreas with mild peripancreatic stranding, possibly early acute pancreatitis, without loculated collection. 3. Moderate bilateral pleural effusions. 4. Moderate-sized periumbilical hernia containing fat, without fat necrosis. Electronically signed by: Elsie Gravely MD 08/19/2024 09:13 PM EST RP Workstation: [id number]   CT Cervical Spine Wo Contrast Result Date: 08/19/2024 CLINICAL DATA:  Difficulty walking generalized weakness EXAM: CT CERVICAL SPINE WITHOUT CONTRAST TECHNIQUE: Multidetector CT imaging of the cervical spine was performed without intravenous contrast. Multiplanar CT image reconstructions were also generated. RADIATION DOSE REDUCTION: This exam was performed according to the departmental dose-optimization program which includes automated exposure control, adjustment of the mA and/or kV according to patient size and/or use of iterative reconstruction technique. COMPARISON:  Radiograph 01/13/2024 FINDINGS: Alignment: Straightening of the cervical spine. Trace anterolisthesis C2 on C3. Facet alignment is normal Skull base and vertebrae: No acute  fracture. No primary bone lesion or focal pathologic process. Soft tissues and spinal canal: No prevertebral fluid or swelling. No visible canal hematoma. Disc levels: Anterior fusion hardware C3-C4 with solid interbody fusion. Partial ankylosis of the facets at C3-C4. Fusion of the right facets at C2-C3 mild degenerative change C4-C5, C5-C6, C6-C7 and C7-T1. Multilevel foraminal narrowing, worse on the right side at C4-C5. At least moderate canal stenosis C3-C4. Suspected mild canal stenosis at C4-C5 and C5-C6. Mild canal stenosis slightly inferior to the C2-C3 disc space secondary to osteophyte. Upper chest: Partially visualized pleural effusions. Other: None IMPRESSION: 1. Straightening of the cervical spine with postsurgical changes at C3-C4. No acute osseous abnormality. 2. Multilevel degenerative changes as described above 3. Partially visualized pleural effusions. Electronically Signed   By: Luke Bun M.D.   On: 08/19/2024 18:28   MR BRAIN WO CONTRAST Result Date: 08/19/2024 EXAM: MRI BRAIN WITHOUT CONTRAST 08/19/2024 05:04:20 PM TECHNIQUE: Multiplanar multisequence MRI of the head/brain was performed without the administration of intravenous contrast. COMPARISON: CT head 06/29/2025. CLINICAL HISTORY: Mental status change, unknown cause. FINDINGS: BRAIN AND VENTRICLES: No acute infarct. No intracranial hemorrhage. No mass. No midline shift. No hydrocephalus. The sella is unremarkable. Normal flow voids. Moderate generalized parenchymal volume loss. ORBITS: No significant abnormality. SINUSES AND MASTOIDS: No significant abnormality. BONES AND SOFT TISSUES: Normal marrow signal. No soft tissue abnormality. Anterior cervical fusion hardware in the partially visualized upper cervical spine. IMPRESSION: 1. No acute findings. 2. Moderate generalized parenchymal volume loss. Electronically signed by: Donnice Mania MD 08/19/2024 05:38 PM EST RP Workstation: [id number]   CT HEAD WO CONTRAST Result Date:  08/19/2024 EXAM: CT HEAD WITHOUT CONTRAST 08/19/2024 01:51:33 PM TECHNIQUE: CT of the head was performed  without the administration of intravenous contrast. Automated exposure control, iterative reconstruction, and/or weight based adjustment of the mA/kV was utilized to reduce the radiation dose to as low as reasonably achievable. COMPARISON: None available. CLINICAL HISTORY: AMS Altered mental status. FINDINGS: BRAIN AND VENTRICLES: Mild scattered white matter hypodensities which are nonspecific but most commonly represent chronic microvascular ischemic changes. No acute hemorrhage. No evidence of an acute territorial infarction. No hydrocephalus. No extra-axial collection. No mass effect or midline shift. ORBITS: No acute abnormality. SINUSES: No acute abnormality. SOFT TISSUES AND SKULL: No acute soft tissue abnormality. No skull fracture. IMPRESSION: 1. No acute intracranial abnormality. Electronically signed by: Prentice Spade MD 08/19/2024 02:01 PM EST RP Workstation: [id number]    Assessment: 79 y.o. male with PMH of PD on sinemet , CHF, sleep apnea, history of gallstone pancreatitis, history of PE/DVT on Xarelto  admitted for confusion, hypothermic. Found to have multifocal pneumonia, sepsis and encephalopathy. Negative flu or COVID. Blood culture neg so far. He was treated with cefepime  and fleeta initially but changed to rocephin  and doxycycline  on 1/25. However, per daughter, pt still mental status fluctuate, not much improvement over time. MRI negative for acute finding. Per daughter, pt seemed to have some worsening cognition over time. Pt does have probably PD, diagnosed 01/2024 and following with Dr. [name] at [facility], now on sinemet .    Today's exam still consistent with encephalopathy with lethargy and disorientation, no shaking or jerking. No focal deficit. Pt encephalopathy likely multifactorial, related to infection, low grade fever, earlier cefepime  use, possible baseline cognitive impairment. Recommend  continued medical management for underlying disease, treating for infection, continue sinemet  and Xarelto . Will check EEG to rule out seizure although low suspicious. Will check EKG to rule out afib and recheck QTc. May consider seroquel if needed if QTc allows.   Plan: - continued medical management for underlying disease per primary team - continue sinemet  and Xarelto .  - Will check EEG to rule out seizure although low suspicious.  - Will check EKG to rule out afib and recheck QTc. May consider seroquel if needed if QTc allows.  - PT/OT/speech - outpt follow up with Dr. [name] - discussed with Dr. Mdala-Gausi  Thank you for this consultation and allowing us  to participate in the care of this patient.  Ary Cummins, MD PhD Stroke Neurology 08/24/2024 12:40 PM      [1]  Allergies Allergen Reactions   Lisinopril     Other Reaction(s): Not available   Ace Inhibitors     Other reaction(s): cough   Penicillins Itching and Other (See Comments)    Has patient had a PCN reaction causing immediate rash, facial/tongue/throat swelling, SOB or lightheadedness with hypotension: No Has patient had a PCN reaction causing severe rash involving mucus membranes or skin necrosis: No Has patient had a PCN reaction that required hospitalization No Has patient had a PCN reaction occurring within the last 10 years: No If all of the above answers are NO, then may proceed with Cephalosporin use.  [2]  No current facility-administered medications on file prior to encounter.   Current Outpatient Medications on File Prior to Encounter  Medication Sig Dispense Refill   bismuth subsalicylate (PEPTO BISMOL) 262 MG/15ML suspension Take 30 mLs by mouth every 6 (six) hours as needed for diarrhea or loose stools.     carbidopa -levodopa  (SINEMET  IR) 25-100 MG tablet Take 1 tablet by mouth 3 (three) times daily. 10am/2pm/6pm 270 tablet 1   polyethylene glycol (MIRALAX  / GLYCOLAX ) packet Take 17 g by  mouth daily as  needed for mild constipation.     tamsulosin (FLOMAX) 0.4 MG CAPS capsule Take 0.4 mg by mouth daily.     XARELTO  15 MG TABS tablet Take 15 mg by mouth daily with supper. with food     buPROPion (WELLBUTRIN XL) 150 MG 24 hr tablet Take 1 tablet by mouth daily. (Patient not taking: Reported on 08/21/2024)     famotidine  (PEPCID ) 40 MG tablet Take 1 tablet (40 mg total) by mouth daily. (Patient not taking: Reported on 08/21/2024) 30 tablet 0   furosemide  (LASIX ) 20 MG tablet Take 1 tablet (20 mg total) by mouth daily. (Patient not taking: Reported on 08/21/2024) 90 tablet 1   gabapentin (NEURONTIN) 100 MG capsule Take 100 mg by mouth at bedtime as needed. (Patient not taking: Reported on 08/21/2024)     NON FORMULARY CPAP MACHINE QHS     Respiratory Therapy Supplies (CARETOUCH 2 CPAP HOSE HANGER) MISC at bedtime     rosuvastatin  (CRESTOR ) 40 MG tablet Take 40 mg by mouth daily. (Patient not taking: Reported on 08/21/2024)     spironolactone  (ALDACTONE ) 25 MG tablet Take 1 tablet (25 mg total) by mouth daily. (Patient not taking: Reported on 08/21/2024) 90 tablet 3

## 2024-08-25 DIAGNOSIS — T361X5A Adverse effect of cephalosporins and other beta-lactam antibiotics, initial encounter: Secondary | ICD-10-CM | POA: Diagnosis not present

## 2024-08-25 DIAGNOSIS — G9341 Metabolic encephalopathy: Secondary | ICD-10-CM

## 2024-08-25 DIAGNOSIS — I4891 Unspecified atrial fibrillation: Secondary | ICD-10-CM | POA: Diagnosis not present

## 2024-08-25 DIAGNOSIS — J188 Other pneumonia, unspecified organism: Secondary | ICD-10-CM | POA: Diagnosis not present

## 2024-08-25 DIAGNOSIS — Z7901 Long term (current) use of anticoagulants: Secondary | ICD-10-CM

## 2024-08-25 DIAGNOSIS — G9340 Encephalopathy, unspecified: Secondary | ICD-10-CM | POA: Diagnosis not present

## 2024-08-25 DIAGNOSIS — A419 Sepsis, unspecified organism: Secondary | ICD-10-CM | POA: Diagnosis not present

## 2024-08-25 DIAGNOSIS — G20A1 Parkinson's disease without dyskinesia, without mention of fluctuations: Secondary | ICD-10-CM | POA: Diagnosis not present

## 2024-08-25 LAB — GLUCOSE, CAPILLARY
Glucose-Capillary: 93 mg/dL (ref 70–99)
Glucose-Capillary: 100 mg/dL — ABNORMAL HIGH (ref 70–99)
Glucose-Capillary: 79 mg/dL (ref 70–99)
Glucose-Capillary: 85 mg/dL (ref 70–99)
Glucose-Capillary: 83 mg/dL (ref 70–99)

## 2024-08-25 LAB — COMPREHENSIVE METABOLIC PANEL WITH GFR
ALT: 12 U/L (ref 0–44)
AST: 36 U/L (ref 15–41)
Albumin: 3.3 g/dL — ABNORMAL LOW (ref 3.5–5.0)
Alkaline Phosphatase: 78 U/L (ref 38–126)
Anion gap: 8 (ref 5–15)
BUN: 22 mg/dL (ref 8–23)
CO2: 27 mmol/L (ref 22–32)
Calcium: 9.1 mg/dL (ref 8.9–10.3)
Chloride: 105 mmol/L (ref 98–111)
Creatinine, Ser: 1.04 mg/dL (ref 0.61–1.24)
GFR, Estimated: >60 mL/min
Glucose, Bld: 108 mg/dL — ABNORMAL HIGH (ref 70–99)
Potassium: 4.5 mmol/L (ref 3.5–5.1)
Sodium: 141 mmol/L (ref 135–145)
Total Bilirubin: 1.4 mg/dL — ABNORMAL HIGH (ref 0.0–1.2)
Total Protein: 6.7 g/dL (ref 6.5–8.1)

## 2024-08-25 LAB — PHOSPHORUS: Phosphorus: 3 mg/dL (ref 2.5–4.6)

## 2024-08-25 LAB — CBC
HCT: 42.8 % (ref 39.0–52.0)
Hemoglobin: 13.5 g/dL (ref 13.0–17.0)
MCH: 29.7 pg (ref 26.0–34.0)
MCHC: 31.5 g/dL (ref 30.0–36.0)
MCV: 94.1 fL (ref 80.0–100.0)
Platelets: 83 10*3/uL — ABNORMAL LOW (ref 150–400)
RBC: 4.55 MIL/uL (ref 4.22–5.81)
RDW: 14.4 % (ref 11.5–15.5)
WBC: 5.2 10*3/uL (ref 4.0–10.5)
nRBC: 0 % (ref 0.0–0.2)

## 2024-08-25 LAB — MAGNESIUM: Magnesium: 2.2 mg/dL (ref 1.7–2.4)

## 2024-08-25 MED ORDER — OSMOLITE 1.5 CAL PO LIQD
1000.0000 mL | ORAL | Status: DC
  Administered 2024-08-25 – 2024-08-26 (×2): 1000 mL
  Filled 2024-08-25 (×4): qty 1000

## 2024-08-25 NOTE — Progress Notes (Signed)
 Nutrition Follow-up  DOCUMENTATION CODES:   Morbid obesity  INTERVENTION:  - New TF regimen as below: Osmolite 1.5 at 60mL/hr (run only when OFF CPAP)  Prosource TF20 60 ml daily Provides 2240 kcal, 110 gm protein, 1097 ml free water daily   - Regular diet as tolerated.    NUTRITION DIAGNOSIS:   Inadequate oral intake related to inability to eat, lethargy/confusion as evidenced by meal completion < 25%. *ongoing  GOAL:   Patient will meet greater than or equal to 90% of their needs *met with TF  MONITOR:   Diet advancement, Labs, Weight trends, TF tolerance  REASON FOR ASSESSMENT:   Consult Enteral/tube feeding initiation and management  ASSESSMENT:   79 y.o. male with PMH of Parkinson's disease, chronic HFrEF, sleep apnea, history of gallstone pancreatitis, history of PE/DVT on Xarelto  who presented due to increasing weakness and confusion. Admitted for sepsis, encephalopathy, and PNA.  1/23 Admit 1/24 SLP eval unable to be completed due to patient being lethargic 1/26 SLP eval unable to be completed due to patient being on BiPAP 1/26 Cortrak placed (tip in stomach); TF initiated  Patient tolerating tube feeds with no issues. Running at 86mL/hr this morning. Patient has been refusing CPAP at night.   Patient confused and unable to provide nutrition history. Denied any issues with tube feeds.  Discussed with MD, will place patient on continuous feeds since patient has been refusing CPAP at night. If patient on CPAP, tube feeds will need to be held. Discussed plan with RN.    Admit weight: 296# (copied from November) -> 325# Current weight: 314# I&O's: -4.2L since admit  Medications reviewed and include: Sinemet   Labs reviewed: -   NUTRITION - FOCUSED PHYSICAL EXAM:  Flowsheet Row Most Recent Value  Orbital Region No depletion  Upper Arm Region Mild depletion  Thoracic and Lumbar Region No depletion  Buccal Region No depletion  Temple Region Mild  depletion  Clavicle Bone Region No depletion  Clavicle and Acromion Bone Region No depletion  Scapular Bone Region No depletion  Dorsal Hand No depletion  Patellar Region No depletion  Anterior Thigh Region No depletion  Posterior Calf Region No depletion  Edema (RD Assessment) Mild  Hair Reviewed  Eyes Reviewed  Mouth Reviewed  Skin Reviewed  Nails Reviewed    Diet Order:   Diet Order             Diet regular Room service appropriate? Yes; Fluid consistency: Thin  Diet effective now                   EDUCATION NEEDS:  Not appropriate for education at this time  Skin:  Skin Assessment: Reviewed RN Assessment  Last BM:  1/27 - type 5  Height:  Ht Readings from Last 1 Encounters:  05/30/24 5' 10 (1.778 m)   Weight:  Wt Readings from Last 1 Encounters:  08/25/24 (!) 142.6 kg   Ideal Body Weight:  75.45 kg  BMI:  Body mass index is 45.11 kg/m.  Estimated Nutritional Needs:  Kcal:  2100-2300 kcals Protein:  105-120 grams Fluid:  >/= 2.1L    Trude Ned RD, LDN Contact via Secure Chat.

## 2024-08-25 NOTE — Evaluation (Signed)
 Occupational Therapy Evaluation Patient Details Name: Richard Sandoval MRN: [id number] DOB: 07/08/1946 Today's Date: 08/25/2024   History of Present Illness   Richard Sandoval is a 79 yr old male admitted to the hospital 08-19-24 with weakness, confusion, and cough. He was found to have severe sepsis, multi-focal PNA, acute encephalopathy, hypothermia, and moderate protein calorie malnutrition. PMH: Parkinson's disease, chronic heart failure, sleep apnea, gallstone pancreatitis, PE/DVT, CAD, HTN, obesity, pulmonary HTN, back surgery     Clinical Impressions The pt is currently presenting significantly below his baseline level of functioning for performing ADLs. He is limited by the below listed deficits (see OT problem list). During the session today, he required max to total assist x2 for bed mobility and total assist for donning his socks. He presented with frequent posterior leaning seated EOB, requiring increased cues and up to moderate assistance to correct, though he was pt able to maintain static sitting for brief instances at CGA level. Overall, he was also noted to be with general deconditioning, generalized weakness, impaired functional mobility, impaired ADL performance, and acute confusion/disorientation. OT will continue to benefit from further OT services to maximize his ADL performance and to decrease the risk for further weakness and deconditioning. Patient will benefit from continued inpatient follow up therapy, <3 hours/day.      If plan is discharge home, recommend the following:   Two people to help with walking and/or transfers;Two people to help with bathing/dressing/bathroom;Assistance with cooking/housework;Assistance with feeding;Direct supervision/assist for medications management;Direct supervision/assist for financial management;Assist for transportation;Help with stairs or ramp for entrance;Supervision due to cognitive status     Functional Status Assessment   Patient has  had a recent decline in their functional status and demonstrates the ability to make significant improvements in function in a reasonable and predictable amount of time.     Equipment Recommendations   Other (comment) (defer to next setting)     Recommendations for Other Services         Precautions/Restrictions   Precautions Precautions: Fall Restrictions Other Position/Activity Restrictions: NG tube     Mobility Bed Mobility Overal bed mobility: Needs Assistance Bed Mobility: Supine to Sit, Sit to Supine     Supine to sit: Max assist, +2 for physical assistance Sit to supine: Total assist, +2 for physical assistance   General bed mobility comments: required increased time and effort for supine to sit, increased assist and cues for advancing BLE, assist needed for trunk as well    Transfers        General transfer comment: deferred today, given pt's impaired sitting balance EOB      Balance     Sitting balance-Richard Sandoval Scale: Poor Sitting balance - Comments: frequent posterior leaning, requiring increased cues and up to moderate assistance to correct; pt able to maintain static sitting for brief instances at CGA level         ADL either performed or assessed with clinical judgement   ADL Overall ADL's : Needs assistance/impaired Eating/Feeding: Total assistance Eating/Feeding Details (indicate cue type and reason): NG tube at current Grooming: Moderate assistance;Bed level   Upper Body Bathing: Maximal assistance;Bed level   Lower Body Bathing: Total assistance;Bed level   Upper Body Dressing : Maximal assistance;Bed level   Lower Body Dressing: Total assistance;Bed level Lower Body Dressing Details (indicate cue type and reason): assist needed to donn socks in bed     Toileting- Clothing Manipulation and Hygiene: Total assistance;Bed level  Pertinent Vitals/Pain Pain Assessment Pain Assessment: No/denies pain      Extremity/Trunk Assessment Upper Extremity Assessment Upper Extremity Assessment: Generalized weakness;Right hand dominant;LUE deficits/detail;RUE deficits/detail RUE Deficits / Details: Required AAROM for shoulder flexion, weakness appeared more pronounced proximally, AROM for elbow WFL, pt able to demo gross hand grasp LUE Deficits / Details: Required AAROM for shoulder flexion, weakness appeared more pronounced proximally, AROM for elbow WFL, pt able to demo gross hand grasp   Lower Extremity Assessment Lower Extremity Assessment: Generalized weakness;RLE deficits/detail;LLE deficits/detail RLE Deficits / Details:  (required AAROM to advance BLE off the bed, seated EOB AROM for knee flexion and extension appeared WFL) LLE Deficits / Details:  (required AAROM to advance BLE off the bed, seated EOB AROM for knee flexion and extension appeared [facility])       Communication Communication Factors Affecting Communication: Difficulty expressing self;Reduced clarity of speech (Pt appeared to have occasional word jumbling.)   Cognition Arousal: Alert Behavior During Therapy: WFL for tasks assessed/performed, Flat affect Cognition: Cognition impaired   Orientation impairments: Time, Place, Situation Awareness: Online awareness impaired   Attention impairment (select first level of impairment): Sustained attention, Divided attention Executive functioning impairment (select all impairments): Initiation, Sequencing, Problem solving          Following commands: Impaired Following commands impaired: Follows one step commands inconsistently     Cueing  General Comments   Cueing Techniques: Verbal cues;Gestural cues;Tactile cues              Home Living Family/patient expects to be discharged to:: Private residence Living Arrangements: Spouse/significant other;Children (spouse, adult daughter, and 3 grandsons; his grandsons are ages 31, 17, and 4) Available Help at Discharge:  Family Type of Home: House Home Access: Level entry     Home Layout: One level     Bathroom Shower/Tub: Tub/shower unit         Home Equipment: Hospital bed;Rolling Walker (2 wheels);Cane - single point   Additional Comments: Information contained here was taken from the pt's daughter who was present during part of the session, as the pt was unable to provide this information due to altered mental status.      Prior Functioning/Environment Prior Level of Function : Independent/Modified Independent;Needs assist             Mobility Comments: He used a cane for ambulation inside and outside the home. ADLs Comments: He was modified independent to independent with ADLs and his family performed most of the cooking & cleaning. He was able to do limited driving a couple months ago.    OT Problem List: Decreased strength;Decreased range of motion;Decreased activity tolerance;Impaired balance (sitting and/or standing);Decreased coordination;Decreased cognition;Decreased knowledge of use of DME or AE;Decreased knowledge of precautions;Obesity   OT Treatment/Interventions: Self-care/ADL training;Therapeutic exercise;Energy conservation;DME and/or AE instruction;Therapeutic activities;Cognitive remediation/compensation;Patient/family education;Balance training      OT Goals(Current goals can be found in the care plan section)   Acute Rehab OT Goals OT Goal Formulation: With patient/family Time For Goal Achievement: 09/08/24 Potential to Achieve Goals: Good ADL Goals Pt Will Perform Grooming: with set-up;with supervision;sitting Pt Will Perform Upper Body Dressing: with supervision;with set-up;sitting Pt Will Transfer to Toilet: with min assist;bedside commode;stand pivot transfer Additional ADL Goal #1: The pt will perform bed mobility with min assist, in prep for progressive ADL participation.   OT Frequency:  Min 2X/week    Co-evaluation PT/OT/SLP Co-Evaluation/Treatment:  Yes Reason for Co-Treatment: For patient/therapist safety;To address functional/ADL transfers PT goals addressed during session: Mobility/safety  with mobility;Balance OT goals addressed during session: ADL's and self-care;Proper use of Adaptive equipment and DME      AM-PAC OT 6 Clicks Daily Activity     Outcome Measure Help from another person eating meals?: Total Help from another person taking care of personal grooming?: A Lot Help from another person toileting, which includes using toliet, bedpan, or urinal?: Total Help from another person bathing (including washing, rinsing, drying)?: A Lot Help from another person to put on and taking off regular upper body clothing?: A Lot Help from another person to put on and taking off regular lower body clothing?: Total 6 Click Score: 9   End of Session Equipment Utilized During Treatment: Other (comment) (N/A) Nurse Communication: Mobility status  Activity Tolerance: Patient limited by fatigue Patient left: in bed;with call bell/phone within reach;with bed alarm set  OT Visit Diagnosis: Other abnormalities of gait and mobility (R26.89);Muscle weakness (generalized) (M62.81);Feeding difficulties (R63.3);Other symptoms and signs involving cognitive function                Time: 1040-1118 OT Time Calculation (min): 38 min Charges:  OT General Charges $OT Visit: 1 Visit OT Evaluation $OT Eval Moderate Complexity: 1 Mod OT Treatments $Self Care/Home Management : 8-22 mins   Delanna JINNY Lesches, OTR/L 08/25/2024, 2:18 PM

## 2024-08-25 NOTE — Progress Notes (Addendum)
 STROKE TEAM PROGRESS NOTE   SUBJECTIVE (INTERVAL HISTORY) His daughter, PT and OT are at the bedside.  Per RN, pt is more awake alert and less lethargic. Talking more with daughter and answering some orientation questions. On my exam, pt still mildly lethargic but eyes open, orientated to place and people, but not to age or time. EEG no seizure, pending MRI. EKG yesterday showed afib.    OBJECTIVE Temp:  [99 F (37.2 C)-99.5 F (37.5 C)] 99.3 F (37.4 C) (01/29 0700) Pulse Rate:  [59-79] 79 (01/28 1600) Cardiac Rhythm: Atrial fibrillation;Heart block (01/28 2000) Resp:  [12-19] 16 (01/29 0700) BP: (92-133)/(53-106) 125/106 (01/29 0500) SpO2:  [94 %-99 %] 96 % (01/28 1600) Weight:  [142.6 kg] 142.6 kg (01/29 0408)  Recent Labs  Lab 08/24/24 1558 08/24/24 2007 08/24/24 2346 08/25/24 0323 08/25/24 0746  GLUCAP 85 90 85 93 100*   Recent Labs  Lab 08/21/24 0713 08/22/24 0247 08/23/24 0321 08/24/24 0310 08/25/24 0250  NA 140 143 143 141 141  K 4.8 3.8 3.8 3.7 4.5  CL 109 110 106 105 105  CO2 22 24 26 26 27   GLUCOSE 88 87 74 103* 108*  BUN 13 13 18 22 22   CREATININE 1.15 1.19 1.19 1.06 1.04  CALCIUM  9.2 9.2 9.1 8.8* 9.1  MG  --   --  1.8 1.9 2.2  PHOS  --   --  2.4* 2.4* 3.0   Recent Labs  Lab 08/21/24 0713 08/22/24 0247 08/23/24 0321 08/24/24 0310 08/25/24 0250  AST 46* 34 32 28 36  ALT 20 27 16 13 12   ALKPHOS 84 81 85 77 78  BILITOT 1.7* 2.2* 2.7* 2.1* 1.4*  PROT 6.6 6.4* 6.7 6.3* 6.7  ALBUMIN  3.3* 3.3* 3.4* 3.1* 3.3*   Recent Labs  Lab 08/19/24 1425 08/20/24 0156 08/21/24 0713 08/22/24 0247 08/23/24 0321 08/24/24 0310 08/25/24 0250  WBC 2.8*   < > 4.3 4.3 4.7 4.8 5.2  NEUTROABS 1.9  --  2.4  --   --   --   --   HGB 13.2   < > 12.6* 12.2* 12.7* 12.4* 13.5  HCT 40.8   < > 39.6 37.3* 39.9 37.8* 42.8  MCV 92.7   < > 93.0 91.0 92.4 90.2 94.1  PLT 70*   < > 73* 65* 74* 69* 83*   < > = values in this interval not displayed.   Recent Labs  Lab  08/19/24 1425  CKTOTAL 39*   No results for input(s): LABPROT, INR in the last 72 hours. No results for input(s): COLORURINE, LABSPEC, PHURINE, GLUCOSEU, HGBUR, BILIRUBINUR, KETONESUR, PROTEINUR, UROBILINOGEN, NITRITE, LEUKOCYTESUR in the last 72 hours.  Invalid input(s): APPERANCEUR     Component Value Date/Time   CHOL 113 10/24/2021 1312   TRIG 47 10/24/2021 1312   HDL 44 10/24/2021 1312   CHOLHDL 2.6 10/24/2021 1312   LDLCALC 58 10/24/2021 1312   Lab Results  Component Value Date   HGBA1C 5.9 (H) 12/25/2021   No results found for: LABOPIA, COCAINSCRNUR, LABBENZ, AMPHETMU, THCU, LABBARB  No results for input(s): ETH in the last 168 hours.  I have personally reviewed the radiological images below and agree with the radiology interpretations.  DG Abd Portable 1V Result Date: 08/24/2024 EXAM: 1 VIEW XRAY OF THE ABDOMEN 08/24/2024 04:05:00 PM COMPARISON: 08/22/2024 CLINICAL HISTORY: Encounter for nasogastric tube placement. ICD10: 747668 Encounter for nasogastric tube placement. FINDINGS: LINES, TUBES AND DEVICES: Enteric tube in place with tip overlying the distal gastric  body. Likely temperature probe overlying the pelvis. BOWEL: Nonobstructive bowel gas pattern. SOFT TISSUES: Cholecystectomy clips present. No abnormal calcifications. BONES: Lumbar surgical hardware noted. No acute fracture. LUNG BASES: Stable patchy opacities at the lung bases. IMPRESSION: 1. Enteric tube in place with tip overlying the distal gastric body. Electronically signed by: Morgane Naveau MD 08/24/2024 07:02 PM EST RP Workstation: [id number]   DG Abd 1 View Result Date: 08/24/2024 EXAM: 1 VIEW XRAY OF THE ABDOMEN 08/24/2024 06:57:00 PM COMPARISON: 08/22/2024 CLINICAL HISTORY: Encounter for nasogastric tube placement. ICD10: 747668 Encounter for nasogastric tube placement. FINDINGS: LINES, TUBES AND DEVICES: Enteric tube in place with distal tip terminating within the  expected location of the gastric antrum. BOWEL: Nonobstructive bowel gas pattern. SOFT TISSUES: Surgical clips in the right upper quadrant. No abnormal calcifications. BONES: Postoperative changes of the lumbar spine. No acute fracture. LUNG BASES: Patchy opacities at the lung bases, similar to prior. IMPRESSION: 1. Enteric tube in place with distal tip terminating within the expected location of the gastric antrum. Electronically signed by: Morgane Naveau MD 08/24/2024 07:00 PM EST RP Workstation: [id number]   EEG adult Result Date: 08/24/2024 Shelton Arlin KIDD, MD     08/24/2024  4:05 PM Patient Name: Datrell Dunton MRN: [id number] Epilepsy Attending: Arlin KIDD Shelton Referring Physician/Provider: Jerri Pfeiffer, MD Date: 08/24/2024 Duration: 23.02 mins Patient history: 79yo M with ams. EEG to evaluate for seizure Level of alertness: Awake AEDs during EEG study: None Technical aspects: This EEG study was done with scalp electrodes positioned according to the 10-20 International system of electrode placement. Electrical activity was reviewed with band pass filter of 1-70Hz , sensitivity of 7 uV/mm, display speed of 27mm/sec with a 60Hz  notched filter applied as appropriate. EEG data were recorded continuously and digitally stored.  Video monitoring was available and reviewed as appropriate. Description: EEG showed continuous generalized predominantly  5 to 7 Hz theta slowing admixed with intermittent 2-3hz  delta slowing. Hyperventilation and photic stimulation were not performed.   ABNORMALITY - Continuous slow, generalized IMPRESSION: This study is suggestive of generalized cerebral dysfunction (encephalopathy). No seizures or epileptiform discharges were seen throughout the recording. Arlin KIDD Shelton   DG Abd 1 View Result Date: 08/22/2024 EXAM: 1 VIEW XRAY OF THE ABDOMEN 08/22/2024 12:21:00 PM COMPARISON: CT abdomen and pelvis 08/19/2024. CLINICAL HISTORY: 79 year old male. Encounter for nasogastric tube  placement. FINDINGS: LINES, TUBES AND DEVICES: Enteric tube in place with tip projecting over the left upper quadrant, likely in the gastric body. BOWEL: Nonobstructive visible bowel gas pattern. SOFT TISSUES: Chronic cholecystectomy clips. No abnormal calcifications. BONES: Lumbar surgical hardware noted. No acute fracture. LUNG BASES: Patchy opacities in bilateral lung bases. Lower lung opacity not significantly changed. IMPRESSION: 1. Feeding tube tube tip in the gastric body. 2. Stable patchy lung opacities. Electronically signed by: Helayne Hurst MD 08/22/2024 12:37 PM EST RP Workstation: [id number]   CT CHEST ABDOMEN PELVIS W CONTRAST Result Date: 08/19/2024 EXAM: CT CHEST WITH CONTRAST 08/19/2024 09:00:00 PM TECHNIQUE: CT of the chest was performed with the administration of 100 mL of iohexol  (OMNIPAQUE ) 300 MG/ML solution. Multiplanar reformatted images are provided for review. Automated exposure control, iterative reconstruction, and/or weight based adjustment of the mA/kV was utilized to reduce the radiation dose to as low as reasonably achievable. COMPARISON: CT abdomen and pelvis 12/14/2023 and cardiac CT 08/01/2021. CLINICAL HISTORY: Sepsis; purulence in skin folds of groin. FINDINGS: MEDIASTINUM: Heart is unremarkable. No pericardial effusions. Calcification of the aorta and coronary arteries. No aortic aneurysm. The central airways are  clear. The esophagus is decompressed. The thyroid  gland is unremarkable. LYMPH NODES: Mediastinal lymph nodes are not pathologically enlarged. No hilar or axillary lymphadenopathy. No significant pelvic or groin lymphadenopathy. LUNGS AND PLEURA: Moderate bilateral pleural effusions. Atelectasis in the lung bases. Multiple focal areas of ground glass infiltration throughout both lungs with a mostly perihilar distribution. Air bronchograms are present. This is most likely to represent multifocal pneumonia or aspiration. Bronchial wall thickening consistent with  bronchitis. No pneumothorax. SOFT TISSUES/BONES: Degenerative changes in the spine and shoulders. Postoperative changes with lumbar laminectomies and posterior fixation hardware. Anterior fixation of the lower cervical spine. No acute bony abnormalities. No acute abnormality of the soft tissues. UPPER ABDOMEN: Surgical absence of the gallbladder. No bile duct dilatation. Fatty infiltration of the pancreas with mild peripancreatic stranding, possibly early acute pancreatitis. No loculated collection. Spleen and adrenal glands are normal. Kidneys, ureters, and the bladder are normal. Prostate gland is not enlarged. Small scrotal hydroceles. Moderate-sized periumbilical hernia containing fat. No fat necrosis. Mild edema in the dependent abdominal and pelvic soft tissues. No loculated collections. The small bowel and colon are mostly decompressed. No inflammatory changes. The appendix is not visualized. Calcification of the abdominal aorta without aneurysm. IMPRESSION: 1. Multiple focal areas of ground glass infiltration throughout both lungs with a mostly perihilar distribution, likely representing multifocal pneumonia or aspiration, with associated bronchial wall thickening consistent with bronchitis. 2. Fatty infiltration of the pancreas with mild peripancreatic stranding, possibly early acute pancreatitis, without loculated collection. 3. Moderate bilateral pleural effusions. 4. Moderate-sized periumbilical hernia containing fat, without fat necrosis. Electronically signed by: Elsie Gravely MD 08/19/2024 09:13 PM EST RP Workstation: [id number]   CT Cervical Spine Wo Contrast Result Date: 08/19/2024 CLINICAL DATA:  Difficulty walking generalized weakness EXAM: CT CERVICAL SPINE WITHOUT CONTRAST TECHNIQUE: Multidetector CT imaging of the cervical spine was performed without intravenous contrast. Multiplanar CT image reconstructions were also generated. RADIATION DOSE REDUCTION: This exam was performed according  to the departmental dose-optimization program which includes automated exposure control, adjustment of the mA and/or kV according to patient size and/or use of iterative reconstruction technique. COMPARISON:  Radiograph 01/13/2024 FINDINGS: Alignment: Straightening of the cervical spine. Trace anterolisthesis C2 on C3. Facet alignment is normal Skull base and vertebrae: No acute fracture. No primary bone lesion or focal pathologic process. Soft tissues and spinal canal: No prevertebral fluid or swelling. No visible canal hematoma. Disc levels: Anterior fusion hardware C3-C4 with solid interbody fusion. Partial ankylosis of the facets at C3-C4. Fusion of the right facets at C2-C3 mild degenerative change C4-C5, C5-C6, C6-C7 and C7-T1. Multilevel foraminal narrowing, worse on the right side at C4-C5. At least moderate canal stenosis C3-C4. Suspected mild canal stenosis at C4-C5 and C5-C6. Mild canal stenosis slightly inferior to the C2-C3 disc space secondary to osteophyte. Upper chest: Partially visualized pleural effusions. Other: None IMPRESSION: 1. Straightening of the cervical spine with postsurgical changes at C3-C4. No acute osseous abnormality. 2. Multilevel degenerative changes as described above 3. Partially visualized pleural effusions. Electronically Signed   By: Luke Bun M.D.   On: 08/19/2024 18:28   MR BRAIN WO CONTRAST Result Date: 08/19/2024 EXAM: MRI BRAIN WITHOUT CONTRAST 08/19/2024 05:04:20 PM TECHNIQUE: Multiplanar multisequence MRI of the head/brain was performed without the administration of intravenous contrast. COMPARISON: CT head 06/29/2025. CLINICAL HISTORY: Mental status change, unknown cause. FINDINGS: BRAIN AND VENTRICLES: No acute infarct. No intracranial hemorrhage. No mass. No midline shift. No hydrocephalus. The sella is unremarkable. Normal flow voids. Moderate generalized parenchymal volume  loss. ORBITS: No significant abnormality. SINUSES AND MASTOIDS: No significant  abnormality. BONES AND SOFT TISSUES: Normal marrow signal. No soft tissue abnormality. Anterior cervical fusion hardware in the partially visualized upper cervical spine. IMPRESSION: 1. No acute findings. 2. Moderate generalized parenchymal volume loss. Electronically signed by: Donnice Mania MD 08/19/2024 05:38 PM EST RP Workstation: [id number]   CT HEAD WO CONTRAST Result Date: 08/19/2024 EXAM: CT HEAD WITHOUT CONTRAST 08/19/2024 01:51:33 PM TECHNIQUE: CT of the head was performed without the administration of intravenous contrast. Automated exposure control, iterative reconstruction, and/or weight based adjustment of the mA/kV was utilized to reduce the radiation dose to as low as reasonably achievable. COMPARISON: None available. CLINICAL HISTORY: AMS Altered mental status. FINDINGS: BRAIN AND VENTRICLES: Mild scattered white matter hypodensities which are nonspecific but most commonly represent chronic microvascular ischemic changes. No acute hemorrhage. No evidence of an acute territorial infarction. No hydrocephalus. No extra-axial collection. No mass effect or midline shift. ORBITS: No acute abnormality. SINUSES: No acute abnormality. SOFT TISSUES AND SKULL: No acute soft tissue abnormality. No skull fracture. IMPRESSION: 1. No acute intracranial abnormality. Electronically signed by: Prentice Spade MD 08/19/2024 02:01 PM EST RP Workstation: [id number]     PHYSICAL EXAM  Temp:  [99 F (37.2 C)-99.5 F (37.5 C)] 99.3 F (37.4 C) (01/29 0700) Pulse Rate:  [59-79] 79 (01/28 1600) Resp:  [12-19] 16 (01/29 0700) BP: (92-133)/(53-106) 125/106 (01/29 0500) SpO2:  [94 %-99 %] 96 % (01/28 1600) Weight:  [142.6 kg] 142.6 kg (01/29 0408)  General - well nourished, well developed, mildly lethargic     Ophthalmologic - fundi not visualized due to noncooperation.     Cardiovascular - irregular heart rhythm   Neuro - lethargic but eyes open spontaneously,orientated to place and people, but not to  age or time. No aphasia, able to say short sentences with moderate dysarthria, following most simple commands, however, intermittent word salad and frequent perseveration. Able to name 3/3 and repeat 5 word simple sentence. No gaze palsy, tracking bilaterally, blinking to visual threat bilaterally. No facial droop. Tongue midline. Bilateral UEs 3/5, bilaterally LEs 2/5 proximal, toe movement R more than left. Sensation, coordination not quite cooperative and gait not tested.   ASSESSMENT/PLAN 79 y.o. male with PMH of PD on sinemet , CHF, sleep apnea, history of gallstone pancreatitis, history of PE/DVT on Xarelto  admitted for confusion, hypothermic. Found to have multifocal pneumonia, sepsis and encephalopathy. Negative flu or COVID. Blood culture neg so far. He was treated with cefepime  and fleeta initially but changed to rocephin  and doxycycline  on 1/25. Pt still has mental status fluctuations but seem slowing improving. MRI negative for acute finding on 1/23. Per daughter, pt seemed to have some worsening cognition over time. Pt does have probably PD, diagnosed 01/2024 and following with Dr. [name] at [facility], now on sinemet .     Today's exam still consistent with multifactorial encephalopathy, related to infection, low grade fever, earlier cefepime  use, possible baseline cognitive impairment. Recommend continued medical management for underlying condition, treating for infection, continue sinemet  and Xarelto . PT and OT. Pt found to have afib on EKG, rate controlled and he is already on Xarelto . Pending repeat limited MRI, if negative, no further work up needed.    Plan: - continued medical management for underlying conditions per primary team - found afib on EKG, new diagnosis, rate controlled. Continue Xarelto   - continue sinemet , and continue outpt follow up with Dr. [name].  - continue PT/OT/speech - pending limited MRI, if negative, no further work  up needed and neuro will sign off.  Hospital day #  6    Ary Cummins, MD PhD Stroke Neurology 08/25/2024 10:52 AM    To contact Stroke Continuity provider, please refer to [email or web address]. After hours, contact General Neurology

## 2024-08-25 NOTE — Progress Notes (Signed)
" °   08/25/24 2052  BiPAP/CPAP/SIPAP  BiPAP/CPAP/SIPAP Pt Type Adult  Reason BIPAP/CPAP not in use Non-compliant (Pt continues to refuse CPAP)  BiPAP/CPAP /SiPAP Vitals  Resp 15  MEWS Score/Color  MEWS Score 0  MEWS Score Color Green    "

## 2024-08-25 NOTE — Evaluation (Signed)
 Physical Therapy Evaluation Patient Details Name: Richard Sandoval MRN: [id number] DOB: November 22, 1945 Today's Date: 08/25/2024  History of Present Illness  Mr. Sandate is a 79 yr old male admitted to the hospital 08-19-24 with weakness, confusion, and cough. He was found to have severe sepsis, multi-focal PNA, acute encephalopathy, hypothermia, and moderate protein calorie malnutrition. PMH: Parkinson's disease, chronic heart failure, sleep apnea, gallstone pancreatitis, PE/DVT, CAD, HTN, obesity, pulmonary HTN, back surgery  Clinical Impression  Pt admitted as above and presenting with functional mobility limitations 2* generalized weakness, morbid obesity, balance deficits and decreased activity tolerance.  This date, pt requiring significant assist of 2 for all tasks and limited to EOB sitting while requiring CGA to mod assist to correct for posterior drift and maintain balance.  Patient will benefit from continued inpatient follow up therapy, <3 hours/day to maximize IND and safety prior to return home.         If plan is discharge home, recommend the following: Two people to help with walking and/or transfers;Two people to help with bathing/dressing/bathroom;Assist for transportation;Help with stairs or ramp for entrance   Can travel by private vehicle   No    Equipment Recommendations None recommended by PT  Recommendations for Other Services       Functional Status Assessment Patient has had a recent decline in their functional status and demonstrates the ability to make significant improvements in function in a reasonable and predictable amount of time.     Precautions / Restrictions Precautions Precautions: Fall Restrictions Weight Bearing Restrictions Per Provider Order: No Other Position/Activity Restrictions: NG tube      Mobility  Bed Mobility Overal bed mobility: Needs Assistance Bed Mobility: Supine to Sit, Sit to Supine     Supine to sit: Max assist, +2 for physical  assistance Sit to supine: Total assist, +2 for physical assistance   General bed mobility comments: required increased time and effort for supine to sit, increased assist and cues for advancing BLE, assist needed for trunk as well    Transfers                   General transfer comment: deferred today, given pt's impaired sitting balance EOB and body habitus    Ambulation/Gait                  Stairs            Wheelchair Mobility     Tilt Bed    Modified Rankin (Stroke Patients Only)       Balance Overall balance assessment: Needs assistance Sitting-balance support: Bilateral upper extremity supported, Feet supported Sitting balance-Leahy Scale: Poor Sitting balance - Comments: frequent posterior leaning, requiring increased cues and up to moderate assistance to correct; pt able to maintain static sitting for brief instances at CGA level                                     Pertinent Vitals/Pain Pain Assessment Pain Assessment: No/denies pain    Home Living Family/patient expects to be discharged to:: Private residence Living Arrangements: Spouse/significant other;Children Available Help at Discharge: Family Type of Home: House Home Access: Level entry       Home Layout: One level Home Equipment: Hospital bed;Rolling Walker (2 wheels);Cane - single point Additional Comments: Information contained here was taken from the pt's daughter who was present during part of the session, as the pt was  unable to provide this information due to altered mental status.    Prior Function Prior Level of Function : Independent/Modified Independent;Needs assist             Mobility Comments: He used a cane for ambulation inside and outside the home. ADLs Comments: He was modified independent to independent with ADLs and his family performed most of the cooking & cleaning. He was able to do limited driving a couple months ago.      Extremity/Trunk Assessment   Upper Extremity Assessment Upper Extremity Assessment: Generalized weakness RUE Deficits / Details: Required AAROM for shoulder flexion, weakness appeared more pronounced proximally, AROM for elbow WFL, pt able to demo gross hand grasp LUE Deficits / Details: Required AAROM for shoulder flexion, weakness appeared more pronounced proximally, AROM for elbow WFL, pt able to demo gross hand grasp    Lower Extremity Assessment Lower Extremity Assessment: Generalized weakness RLE Deficits / Details:  (required AAROM to advance BLE off the bed, seated EOB AROM for knee flexion and extension appeared WFL) LLE Deficits / Details:  (required AAROM to advance BLE off the bed, seated EOB AROM for knee flexion and extension appeared [facility])       Communication   Communication Communication: Impaired Factors Affecting Communication: Difficulty expressing self;Reduced clarity of speech    Cognition Arousal: Alert Behavior During Therapy: [facility] for tasks assessed/performed, Flat affect                             Following commands: Impaired Following commands impaired: Follows one step commands inconsistently     Cueing Cueing Techniques: Verbal cues, Gestural cues, Tactile cues     General Comments      Exercises     Assessment/Plan    PT Assessment Patient needs continued PT services  PT Problem List Decreased strength;Decreased activity tolerance;Decreased range of motion;Decreased balance;Decreased mobility;Decreased knowledge of use of DME;Obesity       PT Treatment Interventions DME instruction;Gait training;Functional mobility training;Therapeutic activities;Therapeutic exercise;Balance training;Patient/family education    PT Goals (Current goals can be found in the Care Plan section)  Acute Rehab PT Goals Patient Stated Goal: Regain IND PT Goal Formulation: With patient Time For Goal Achievement: 09/09/24 Potential to Achieve Goals:  Fair    Frequency Min 2X/week     Co-evaluation PT/OT/SLP Co-Evaluation/Treatment: Yes Reason for Co-Treatment: For patient/therapist safety;To address functional/ADL transfers PT goals addressed during session: Mobility/safety with mobility;Balance OT goals addressed during session: ADL's and self-care;Proper use of Adaptive equipment and DME       AM-PAC PT 6 Clicks Mobility  Outcome Measure Help needed turning from your back to your side while in a flat bed without using bedrails?: A Lot Help needed moving from lying on your back to sitting on the side of a flat bed without using bedrails?: Total Help needed moving to and from a bed to a chair (including a wheelchair)?: Total Help needed standing up from a chair using your arms (e.g., wheelchair or bedside chair)?: Total Help needed to walk in hospital room?: Total Help needed climbing 3-5 steps with a railing? : Total 6 Click Score: 7    End of Session   Activity Tolerance: Patient tolerated treatment well Patient left: in bed;with call bell/phone within reach;with bed alarm set Nurse Communication: Mobility status PT Visit Diagnosis: Unsteadiness on feet (R26.81);Muscle weakness (generalized) (M62.81);Difficulty in walking, not elsewhere classified (R26.2)    Time: 8957-8879 PT Time Calculation (  min) (ACUTE ONLY): 38 min   Charges:   PT Evaluation $PT Eval Moderate Complexity: 1 Mod PT Treatments $Therapeutic Activity: 8-22 mins PT General Charges $$ ACUTE PT VISIT: 1 Visit         [facility] PT Acute Rehabilitation Services Office [phone]   Katina Remick 08/25/2024, 2:33 PM

## 2024-08-25 NOTE — Progress Notes (Signed)
 " Progress Note   Patient: Richard Sandoval FMW:[id number] DOB: [birth year] DOA: 08/19/2024     6 DOS: the patient was seen and examined on 08/25/2024    Brief hospital course: Richard Sandoval is a 79 y.o. male with history of Parkinson's disease, chronic HFrEF, sleep apnea, history of gallstone pancreatitis, history of PE/DVT on Xarelto  who was brought to the ER due to increasing weakness and confusion.  Recently treated for influenza with pneumonia.  On presentation, patient was confused, hypothermic.  CT chest abdomen pelvis showed multifocal pneumonia with concern for aspiration, with bronchitis.  Patient was admitted to the stepdown unit.  Started on [employer] Empiric antibiotics started. Patient remained lethargic. NG tube ordered 08/22/24 and patient started on tube feeds.  Patient remained lethargic and neurology was consulted on 08/24/2024.  Plan for repeat MRI 1/29  Assessment and Plan:  Severe sepsis with no shock Present on admission, evidenced by lactic acidosis, transient hypotension, thrombocytopenia, leukopenia, acute encephalopathy in the setting of infection Sepsis resolving. - Continue antibiotics.  Multifocal pneumonia CT concerning for multifocal pneumonia/aspiration pneumonia/bronchitis. Flu and COVID-negative. Patient was started on cefepime , doxycycline , vancomycin . Vancomycin  discontinued due to negative MRSA screen. - Dced cefepime  on 1/25 due to encephalopathy, switched to Ceftriaxone  (unable to order Zosyn  as patient has a penicillin allergy). - Continue doxycycline . - Continue antibiotics till 1/30 to complete a 7-day course.  - Given concern for aspiration pneumonia, SLP evaluation ordered.  Still pending, given lethargy and patient's inability to participate.  Acute encephalopathy Likely septic encephalopathy due to infection and possibly exacerbated by Cefepime . MRI brain 1/23 showed no acute findings.  Patient has moderate generalized parenchymal volume  loss. Repeat brain MRI ordered 1/28, pending.  Neurology on board.  Patient is alert and follows command, conversation confusing. Ammonia level 50. Patient remains lethargic/encephalopathic despite antibiotic therapy and discontinuation of cefepime . - Continue to orient. - Delirium precautions.  New low-grade fevers Patient noted to have low-grade fever on 1/27. White count remains stable. Blood cultures repeated.  No growth to date. Respiratory panel negative for influenza, RSV, COVID. - Will continue current antibiotics. - Continue to monitor.  Hyperbilirubinemia Likely due to sepsis - will continue to trend.   HFpEF TTE 06/27/24 with EF 55-60%, diastolic dyfunction Imaging shows bilateral pleural effusions. - Caution with IV fluids. -Diuresis with IV Lasix  beginning 1/26, given hypervolemia.  Will continue to monitor intake and output, daily BMP  Moderate protein calorie malnutrition -NG tube placement 1/26 as patient remains lethargic and needs nutrition, oral medications (Sinemet ). -Nutrition consulted, and patient has been started on tube feeds.   Hypophosphatemia - Replete phosphate as needed.  Hypothermia, resolved Due to sepsis. S/p Bair hugger (now discontinued)  TSH was 5.1, free T4 1.2, cortisol 13.6.  - Continue antibiotics for infection.  History of pulmonary embolism, on Xarelto  at home. - Continue home Xarelto .  Parkinson's disease - Continue home Sinemet .     Subjective: Patient alert but confused this morning.  Follows commands and conversant but nonsensical.  Waiting for MRI today  Physical Exam: BP (!) 125/106   Pulse 79   Temp 99.3 F (37.4 C)   Resp 16   Wt (!) 142.6 kg   SpO2 96%   BMI 45.11 kg/m    General: Alert, conversant but confused Eyes: Pupils equal, reactive  Oral cavity: moist mucous membranes  Head: Atraumatic, normocephalic  Neck: supple  Chest: basal crackles CVS: S1,S2 RRR. No murmurs  Abd: No distention, soft,  non-tender. No masses palpable  Extr: Pedal edema ++ MSK: No joint deformities or swelling  Neurological: Grossly intact.    Data Reviewed:    Latest Ref Rng & Units 08/25/2024    2:50 AM 08/24/2024    3:10 AM 08/23/2024    3:21 AM  CBC  WBC 4.0 - 10.5 K/uL 5.2  4.8  4.7   Hemoglobin 13.0 - 17.0 g/dL 86.4  87.5  87.2   Hematocrit 39.0 - 52.0 % 42.8  37.8  39.9   Platelets 150 - 400 K/uL 83  69  74       Latest Ref Rng & Units 08/25/2024    2:50 AM 08/24/2024    3:10 AM 08/23/2024    3:21 AM  BMP  Glucose 70 - 99 mg/dL 891  896  74   BUN 8 - 23 mg/dL 22  22  18    Creatinine 0.61 - 1.24 mg/dL 8.95  8.93  8.80   Sodium 135 - 145 mmol/L 141  141  143   Potassium 3.5 - 5.1 mmol/L 4.5  3.7  3.8   Chloride 98 - 111 mmol/L 105  105  106   CO2 22 - 32 mmol/L 27  26  26    Calcium  8.9 - 10.3 mg/dL 9.1  8.8  9.1      Family Communication: Spoke with wife and daughter at bedside.   Disposition: Status is: Inpatient Sepsis, requiring IV antibiotics; encephalopathy  DVT ppx: Systemic anticoagulation with Xarelto .      Author: Derryl Duval, MD 08/25/2024 7:57 AM  For on call review [email or web address].    "

## 2024-08-26 ENCOUNTER — Inpatient Hospital Stay (HOSPITAL_COMMUNITY)

## 2024-08-26 DIAGNOSIS — J188 Other pneumonia, unspecified organism: Secondary | ICD-10-CM | POA: Diagnosis not present

## 2024-08-26 DIAGNOSIS — G9341 Metabolic encephalopathy: Secondary | ICD-10-CM | POA: Diagnosis not present

## 2024-08-26 LAB — CBC
HCT: 42 % (ref 39.0–52.0)
Hemoglobin: 13.3 g/dL (ref 13.0–17.0)
MCH: 29.2 pg (ref 26.0–34.0)
MCHC: 31.7 g/dL (ref 30.0–36.0)
MCV: 92.3 fL (ref 80.0–100.0)
Platelets: 104 10*3/uL — ABNORMAL LOW (ref 150–400)
RBC: 4.55 MIL/uL (ref 4.22–5.81)
RDW: 14.3 % (ref 11.5–15.5)
WBC: 4.8 10*3/uL (ref 4.0–10.5)
nRBC: 0 % (ref 0.0–0.2)

## 2024-08-26 LAB — COMPREHENSIVE METABOLIC PANEL WITH GFR
ALT: 18 U/L (ref 0–44)
AST: 33 U/L (ref 15–41)
Albumin: 3.2 g/dL — ABNORMAL LOW (ref 3.5–5.0)
Alkaline Phosphatase: 76 U/L (ref 38–126)
Anion gap: 9 (ref 5–15)
BUN: 23 mg/dL (ref 8–23)
CO2: 28 mmol/L (ref 22–32)
Calcium: 8.9 mg/dL (ref 8.9–10.3)
Chloride: 105 mmol/L (ref 98–111)
Creatinine, Ser: 0.94 mg/dL (ref 0.61–1.24)
GFR, Estimated: >60 mL/min
Glucose, Bld: 116 mg/dL — ABNORMAL HIGH (ref 70–99)
Potassium: 4.2 mmol/L (ref 3.5–5.1)
Sodium: 142 mmol/L (ref 135–145)
Total Bilirubin: 1.3 mg/dL — ABNORMAL HIGH (ref 0.0–1.2)
Total Protein: 6.7 g/dL (ref 6.5–8.1)

## 2024-08-26 LAB — GLUCOSE, CAPILLARY
Glucose-Capillary: 102 mg/dL — ABNORMAL HIGH (ref 70–99)
Glucose-Capillary: 105 mg/dL — ABNORMAL HIGH (ref 70–99)
Glucose-Capillary: 107 mg/dL — ABNORMAL HIGH (ref 70–99)
Glucose-Capillary: 84 mg/dL (ref 70–99)
Glucose-Capillary: 85 mg/dL (ref 70–99)
Glucose-Capillary: 96 mg/dL (ref 70–99)

## 2024-08-26 LAB — MAGNESIUM: Magnesium: 2.2 mg/dL (ref 1.7–2.4)

## 2024-08-26 LAB — PHOSPHORUS: Phosphorus: 3.1 mg/dL (ref 2.5–4.6)

## 2024-08-26 NOTE — Progress Notes (Signed)
 Patient is more alert at this time, having conversation with visitors. Lunch tray set up and offered. Attempted to feed patient his lunch but he declined. He drank about 60cc of water.

## 2024-08-26 NOTE — Plan of Care (Signed)
 Repeat MRI negative for stroke. Continue current management and recommendations as documented yesterday. Will sign off, please call with further questions. Thanks for the consult.  Ary Cummins, MD PhD Stroke Neurology 08/26/2024 8:18 AM

## 2024-08-26 NOTE — Progress Notes (Addendum)
 Patient returned from MRI. TELE reapplied and repositioning. Patient w/ diet order w/ help eating this AM. Oral intake held this due to level of alertness. He is able to open is eye and and allow for RN to perform oral care, face wash, repositioning, but fall back asleep quickly,a nd unable to keep awake for longer period of time. His speech noted incomprehensible, and not really follow any command, although able to cough during oral care. TF sent from pharmacy and has been restarted.

## 2024-08-26 NOTE — Plan of Care (Signed)
  Problem: Clinical Measurements: °Goal: Ability to maintain clinical measurements within normal limits will improve °Outcome: Progressing °  °Problem: Education: °Goal: Knowledge of General Education information will improve °Description: Including pain rating scale, medication(s)/side effects and non-pharmacologic comfort measures °Outcome: Not Progressing °  °Problem: Health Behavior/Discharge Planning: °Goal: Ability to manage health-related needs will improve °Outcome: Not Progressing °  °

## 2024-08-26 NOTE — Plan of Care (Signed)
  Problem: Education: Goal: Knowledge of General Education information will improve Description: Including pain rating scale, medication(s)/side effects and non-pharmacologic comfort measures Outcome: Not Progressing   Problem: Activity: Goal: Risk for activity intolerance will decrease Outcome: Not Progressing   Problem: Nutrition: Goal: Adequate nutrition will be maintained Outcome: Not Progressing   Problem: Coping: Goal: Level of anxiety will decrease Outcome: Not Progressing

## 2024-08-26 NOTE — Progress Notes (Signed)
" °   08/26/24 2132  BiPAP/CPAP/SIPAP  BiPAP/CPAP/SIPAP Pt Type Adult  BiPAP/CPAP/SIPAP Resmed  Reason BIPAP/CPAP not in use Non-compliant    "

## 2024-08-27 DIAGNOSIS — J188 Other pneumonia, unspecified organism: Secondary | ICD-10-CM | POA: Diagnosis not present

## 2024-08-27 LAB — CBC
HCT: 42.7 % (ref 39.0–52.0)
Hemoglobin: 13.3 g/dL (ref 13.0–17.0)
MCH: 29.2 pg (ref 26.0–34.0)
MCHC: 31.1 g/dL (ref 30.0–36.0)
MCV: 93.8 fL (ref 80.0–100.0)
Platelets: 128 10*3/uL — ABNORMAL LOW (ref 150–400)
RBC: 4.55 MIL/uL (ref 4.22–5.81)
RDW: 14.4 % (ref 11.5–15.5)
WBC: 6.1 10*3/uL (ref 4.0–10.5)
nRBC: 0 % (ref 0.0–0.2)

## 2024-08-27 LAB — COMPREHENSIVE METABOLIC PANEL WITH GFR
ALT: 17 U/L (ref 0–44)
AST: 36 U/L (ref 15–41)
Albumin: 3.3 g/dL — ABNORMAL LOW (ref 3.5–5.0)
Alkaline Phosphatase: 76 U/L (ref 38–126)
Anion gap: 8 (ref 5–15)
BUN: 22 mg/dL (ref 8–23)
CO2: 28 mmol/L (ref 22–32)
Calcium: 9.2 mg/dL (ref 8.9–10.3)
Chloride: 104 mmol/L (ref 98–111)
Creatinine, Ser: 0.9 mg/dL (ref 0.61–1.24)
GFR, Estimated: >60 mL/min
Glucose, Bld: 100 mg/dL — ABNORMAL HIGH (ref 70–99)
Potassium: 4.5 mmol/L (ref 3.5–5.1)
Sodium: 140 mmol/L (ref 135–145)
Total Bilirubin: 1.6 mg/dL — ABNORMAL HIGH (ref 0.0–1.2)
Total Protein: 6.9 g/dL (ref 6.5–8.1)

## 2024-08-27 LAB — GLUCOSE, CAPILLARY
Glucose-Capillary: 102 mg/dL — ABNORMAL HIGH (ref 70–99)
Glucose-Capillary: 75 mg/dL (ref 70–99)
Glucose-Capillary: 76 mg/dL (ref 70–99)
Glucose-Capillary: 76 mg/dL (ref 70–99)
Glucose-Capillary: 80 mg/dL (ref 70–99)
Glucose-Capillary: 83 mg/dL (ref 70–99)
Glucose-Capillary: 84 mg/dL (ref 70–99)

## 2024-08-27 MED ORDER — MELATONIN 3 MG PO TABS: 3.0000 mg | ORAL_TABLET | Freq: Every evening | ORAL | Status: DC | PRN

## 2024-08-27 MED ORDER — TAMSULOSIN HCL 0.4 MG PO CAPS
0.4000 mg | ORAL_CAPSULE | Freq: Every day | ORAL | Status: AC
  Administered 2024-08-27 – 2024-09-02 (×5): 0.4 mg via ORAL
  Filled 2024-08-27 (×7): qty 1

## 2024-08-27 NOTE — Progress Notes (Signed)
" °   08/27/24 2257  BiPAP/CPAP/SIPAP  Reason BIPAP/CPAP not in use Non-compliant    "

## 2024-08-27 NOTE — Plan of Care (Signed)
   Problem: Nutrition: Goal: Adequate nutrition will be maintained Outcome: Progressing   Problem: Elimination: Goal: Will not experience complications related to bowel motility Outcome: Progressing   Problem: Safety: Goal: Ability to remain free from injury will improve Outcome: Progressing

## 2024-08-27 NOTE — Progress Notes (Signed)
 " [facility] Progress Note  Patient: Richard Sandoval     FMW:[id number]  DOA: 08/19/2024   PCP: [name], MD       Brief hospital course: This is a 79 year old male with Parkinson disease, history of PE/DVT, chronic HFrEF, gallstone pancreatitis, sleep apnea brought to the hospital on 1/23 for forgetfulness, increased trouble with walking, generalized weakness progressing over 2 weeks.  His wife noted poor oral intake over the past several weeks as well.  The patient had influenza pneumonia about 3 weeks prior and has been treated with antibiotics.  In ED: Temperature noted to be 90 degrees rectally. CT scan of the chest revealed multiple focus of ground glass infiltrates throughout both lungs with bronchial wall thickening consistent with bronchitis, fatty infiltration of the pancreas, moderate bilateral pleural effusions CT head and MRI were unrevealing  He was started on empiric antibiotics for pneumonia but remained encephalopathic.    1/25: Cefepime  changed to ceftriaxone  as the patient's encephalopathy was ongoing 1/26: Core track placed and tube feeds started as he remained encephalopathic and unable to eat 1/28 and neurology consult was requested for persistent encephalopathy.  This revealed that he had at least 2 months of cognitive decline prior to coming to the hospital.   EEG revealed generalized cerebral dysfunction/encephalopathy  Subjective:  Awake and alert on my exam this morning and able to tell me his name.  The nurse states that he had some of his banana and tolerated it well.  Assessment and Plan: Principal Problem:   Multifocal pneumonia with sepsis and hypothermia - Completed 7 days of antibiotics  Active Problems: Acute metabolic encephalopathy - Today the patient is alert - Will turn off his tube feeds, allow him to get hungry and continue to attempt to improve his oral intake - Neurology has recommended an outpatient follow-up for further  management  Atrial flutter - Noted during this hospital stay and alternating with sinus arrhythmia - Rate controlled - Continue Xarelto  which she takes for PE/DVT history  Fever on 1/27 - Low-grade temperature of 100.2 - Continue to follow for further fevers    OSA (obstructive sleep apnea) - Continue to attempt CPAP at night    Pancreatitis? - Admission CT scan revealed: Fatty infiltration of the pancreas with mild peripancreatic stranding, possibly early acute pancreatitis, without loculated collection. -Has been tolerating tube feeds    Chronic combined systolic and diastolic heart failure - Noted to have bilateral pleural effusions on imaging when he was admitted - Last 2D echo from 06/27/2024 revealed an EF of 55 to 60% with grade 2 diastolic dysfunction - He has been receiving Lasix  in the hospital - Continue to follow    CAD in native artery - Cardiac cath from 2023 revealed mild nonobstructive stenosis    Parkinson's disease - Continue Sinemet   History of PE/DVT - Can continue Xarelto     Code Status: Full Code Total time on patient care: 35 minutes DVT prophylaxis:   rivaroxaban  (XARELTO ) tablet 20 mg     Objective:   Vitals:   08/26/24 0500 08/26/24 1342 08/26/24 1941 08/27/24 0415  BP:  125/71 101/81 122/80  Pulse:  67 67 67  Resp:  20 18 20   Temp:  97.8 F (36.6 C) 98.3 F (36.8 C) 98.7 F (37.1 C)  TempSrc:   Oral   SpO2:   100%   Weight: (!) 137.3 kg      Filed Weights   08/24/24 0500 08/25/24 0408 08/26/24 0500  Weight: (!) 147.8  kg (!) 142.6 kg (!) 137.3 kg   Exam: General exam: Appears comfortable HEENT: oral mucosa moist Respiratory system: Clear to auscultation.  Cardiovascular system: S1 & S2 heard Neurologic: Oriented only to person, able to follow commands and move extremities Gastrointestinal system: Abdomen soft, non-tender, nondistended. Normal bowel sounds   Extremities: No cyanosis, clubbing or edema Psychiatry:  Mood &  affect appropriate.      CBC: Recent Labs  Lab 08/21/24 0713 08/22/24 0247 08/23/24 0321 08/24/24 0310 08/25/24 0250 08/26/24 0709 08/27/24 0652  WBC 4.3   < > 4.7 4.8 5.2 4.8 6.1  NEUTROABS 2.4  --   --   --   --   --   --   HGB 12.6*   < > 12.7* 12.4* 13.5 13.3 13.3  HCT 39.6   < > 39.9 37.8* 42.8 42.0 42.7  MCV 93.0   < > 92.4 90.2 94.1 92.3 93.8  PLT 73*   < > 74* 69* 83* 104* 128*   < > = values in this interval not displayed.   Basic Metabolic Panel: Recent Labs  Lab 08/23/24 0321 08/24/24 0310 08/25/24 0250 08/26/24 0709 08/27/24 0652  NA 143 141 141 142 140  K 3.8 3.7 4.5 4.2 4.5  CL 106 105 105 105 104  CO2 26 26 27 28 28   GLUCOSE 74 103* 108* 116* 100*  BUN 18 22 22 23 22   CREATININE 1.19 1.06 1.04 0.94 0.90  CALCIUM  9.1 8.8* 9.1 8.9 9.2  MG 1.8 1.9 2.2 2.2  --   PHOS 2.4* 2.4* 3.0 3.1  --      Scheduled Meds:  carbidopa -levodopa   1 tablet Per Tube 3 times per day   Chlorhexidine  Gluconate Cloth  6 each Topical Daily   feeding supplement (PROSource TF20)  60 mL Per Tube Daily   furosemide   40 mg Intravenous Daily   nystatin  cream   Topical BID   mouth rinse  15 mL Mouth Rinse 4 times per day   rivaroxaban   20 mg Per Tube Q supper    Imaging and lab data personally reviewed   Author: [employer]  08/27/2024 11:30 AM  To contact [facility]>   Check the care team in [facility] and look for the attending/consulting [facility] provider listed  Log into [email or web address] and use [place]'s universal password   Go to> [facility]  and find provider  If you still have difficulty reaching the provider, please page the [facility] (Director on Call) for the Hospitalists listed on amion     "

## 2024-08-28 DIAGNOSIS — J188 Other pneumonia, unspecified organism: Secondary | ICD-10-CM | POA: Diagnosis not present

## 2024-08-28 LAB — CULTURE, BLOOD (ROUTINE X 2)
Culture: NO GROWTH
Culture: NO GROWTH

## 2024-08-28 LAB — GLUCOSE, CAPILLARY
Glucose-Capillary: 88 mg/dL (ref 70–99)
Glucose-Capillary: 86 mg/dL (ref 70–99)
Glucose-Capillary: 95 mg/dL (ref 70–99)
Glucose-Capillary: 95 mg/dL (ref 70–99)
Glucose-Capillary: 110 mg/dL — ABNORMAL HIGH (ref 70–99)
Glucose-Capillary: 97 mg/dL (ref 70–99)
Glucose-Capillary: 92 mg/dL (ref 70–99)

## 2024-08-28 MED ORDER — CARBIDOPA-LEVODOPA 25-100 MG PO TABS
1.0000 | ORAL_TABLET | ORAL | Status: DC
  Administered 2024-08-28 – 2024-08-29 (×2): 1 via NASOGASTRIC
  Filled 2024-08-28 (×2): qty 1

## 2024-08-28 MED ORDER — SORBITOL 70 % SOLN: 30.0000 mL | Freq: Every day | Status: AC | PRN

## 2024-08-28 MED ORDER — CARBIDOPA-LEVODOPA 25-100 MG PO TABS: 1.0000 | ORAL_TABLET | ORAL | Status: DC

## 2024-08-28 MED ORDER — ACETAMINOPHEN 650 MG RE SUPP
650.0000 mg | Freq: Four times a day (QID) | RECTAL | Status: AC | PRN
  Administered 2024-08-30: 650 mg via RECTAL
  Filled 2024-08-28: qty 1

## 2024-08-28 MED ORDER — ACETAMINOPHEN 325 MG PO TABS
650.0000 mg | ORAL_TABLET | Freq: Four times a day (QID) | ORAL | Status: AC | PRN
  Filled 2024-08-28: qty 2

## 2024-08-28 MED ORDER — RIVAROXABAN 20 MG PO TABS
20.0000 mg | ORAL_TABLET | Freq: Every day | ORAL | Status: AC
  Administered 2024-08-28 – 2024-09-02 (×5): 20 mg via ORAL
  Filled 2024-08-28 (×6): qty 1

## 2024-08-28 MED ORDER — MELATONIN 3 MG PO TABS: 3.0000 mg | ORAL_TABLET | Freq: Every evening | ORAL | Status: DC | PRN

## 2024-08-28 NOTE — Progress Notes (Addendum)
 " [facility] Progress Note  Patient: Richard Sandoval     FMW:[id number]  DOA: 08/19/2024   PCP: [name], MD       Brief hospital course: This is a 79 year old male with Parkinson disease, history of PE/DVT, chronic HFrEF, gallstone pancreatitis, sleep apnea brought to the hospital on 1/23 for forgetfulness, increased trouble with walking, generalized weakness progressing over 2 weeks.  His wife noted poor oral intake over the past several weeks as well.  The patient had influenza pneumonia about 3 weeks prior and has been treated with antibiotics.  In ED: Temperature noted to be 90 degrees rectally. CT scan of the chest revealed multiple focus of ground glass infiltrates throughout both lungs with bronchial wall thickening consistent with bronchitis, fatty infiltration of the pancreas, moderate bilateral pleural effusions CT head and MRI were unrevealing  He was started on empiric antibiotics for pneumonia but remained encephalopathic.    1/25: Cefepime  changed to ceftriaxone  as the patient's encephalopathy was ongoing 1/26: Core track placed and tube feeds started as he remained encephalopathic and unable to eat 1/28 and neurology consult was requested for persistent encephalopathy.  This revealed that he had at least 2 months of cognitive decline prior to coming to the hospital.   EEG revealed generalized cerebral dysfunction/encephalopathy  Subjective:  Somnolent today. Poorly communicative   Assessment and Plan: Principal Problem:   Multifocal pneumonia with sepsis and hypothermia - Completed 7 days of antibiotics  Active Problems: Acute metabolic encephalopathy - Neurology has recommended an outpatient follow-up for further management - ? If he was developing dementia related to Parkinson's - today is quite somnolent  Nutrition  - Tube feeds off- following oral intake - meds per tube  Atrial flutter - Noted during this hospital stay and alternating with sinus  arrhythmia - Rate controlled - Continue Xarelto  which he takes for PE/DVT history  Fever on 1/27 - Low-grade temperature of 100.2 - Continue to follow for further fevers    OSA (obstructive sleep apnea) - Continue to attempt CPAP at night    Pancreatitis? - Admission CT scan revealed: Fatty infiltration of the pancreas with mild peripancreatic stranding, possibly early acute pancreatitis, without loculated collection. -Has been tolerating tube feeds    Chronic combined systolic and diastolic heart failure - Noted to have bilateral pleural effusions on imaging when he was admitted - Last 2D echo from 06/27/2024 revealed an EF of 55 to 60% with grade 2 diastolic dysfunction - He has been receiving Lasix  in the hospital - Continue to follow and adjust Lasix  as needed    CAD in native artery - Cardiac cath from 2023 revealed mild nonobstructive stenosis    Parkinson's disease - Continue Sinemet   History of PE/DVT - Can continue Xarelto     Code Status: Full Code Total time on patient care: 35 minutes DVT prophylaxis:   rivaroxaban  (XARELTO ) tablet 20 mg     Objective:   Vitals:   08/27/24 1200 08/27/24 1956 08/28/24 0330 08/28/24 0500  BP: 99/72 125/73 121/73   Pulse: 62 72 65   Resp:  14 16   Temp: 97.9 F (36.6 C) 98.3 F (36.8 C)    TempSrc: Oral Oral    SpO2: 90%  99%   Weight:    130.7 kg   Filed Weights   08/25/24 0408 08/26/24 0500 08/28/24 0500  Weight: (!) 142.6 kg (!) 137.3 kg 130.7 kg   Exam: General exam: Appears comfortable HEENT: oral mucosa moist Respiratory system: Clear to auscultation.  Cardiovascular system: S1 & S2 heard Neurologic: Oriented only to person, able to follow commands and move extremities Gastrointestinal system: Abdomen soft, non-tender, nondistended. Normal bowel sounds   Extremities: No cyanosis, clubbing or edema Psychiatry:  Mood & affect appropriate.      CBC: Recent Labs  Lab 08/23/24 0321 08/24/24 0310  08/25/24 0250 08/26/24 0709 08/27/24 0652  WBC 4.7 4.8 5.2 4.8 6.1  HGB 12.7* 12.4* 13.5 13.3 13.3  HCT 39.9 37.8* 42.8 42.0 42.7  MCV 92.4 90.2 94.1 92.3 93.8  PLT 74* 69* 83* 104* 128*   Basic Metabolic Panel: Recent Labs  Lab 08/23/24 0321 08/24/24 0310 08/25/24 0250 08/26/24 0709 08/27/24 0652  NA 143 141 141 142 140  K 3.8 3.7 4.5 4.2 4.5  CL 106 105 105 105 104  CO2 26 26 27 28 28   GLUCOSE 74 103* 108* 116* 100*  BUN 18 22 22 23 22   CREATININE 1.19 1.06 1.04 0.94 0.90  CALCIUM  9.1 8.8* 9.1 8.9 9.2  MG 1.8 1.9 2.2 2.2  --   PHOS 2.4* 2.4* 3.0 3.1  --      Scheduled Meds:  carbidopa -levodopa   1 tablet Oral 3 times per day   Chlorhexidine  Gluconate Cloth  6 each Topical Daily   feeding supplement (PROSource TF20)  60 mL Per Tube Daily   furosemide   40 mg Intravenous Daily   nystatin  cream   Topical BID   mouth rinse  15 mL Mouth Rinse 4 times per day   rivaroxaban   20 mg Oral Q supper   tamsulosin   0.4 mg Oral Daily    Imaging and lab data personally reviewed   Author: [employer]  08/28/2024 12:11 PM  To contact [facility]>   Check the care team in [facility] and look for the attending/consulting [facility] provider listed  Log into [email or web address] and use [place]'s universal password   Go to> [facility]  and find provider  If you still have difficulty reaching the provider, please page the [facility] (Director on Call) for the Hospitalists listed on amion     "

## 2024-08-28 NOTE — Progress Notes (Signed)
" °   08/28/24 2221  BiPAP/CPAP/SIPAP  BiPAP/CPAP/SIPAP Pt Type Adult  Reason BIPAP/CPAP not in use Non-compliant (Refusing CPAP for the night.)    "

## 2024-08-28 NOTE — Plan of Care (Signed)
   Problem: Education: Goal: Knowledge of General Education information will improve Description: Including pain rating scale, medication(s)/side effects and non-pharmacologic comfort measures Outcome: Progressing   Problem: Clinical Measurements: Goal: Ability to maintain clinical measurements within normal limits will improve Outcome: Progressing   Problem: Nutrition: Goal: Adequate nutrition will be maintained Outcome: Progressing

## 2024-08-28 NOTE — Plan of Care (Signed)

## 2024-08-28 NOTE — Progress Notes (Addendum)
 Pt CBG was 88. Apple sauce and cup of orange juice was intaked. Pt repeated CBG 86. Per order, attending was notified of CBG <90.

## 2024-08-29 DIAGNOSIS — J188 Other pneumonia, unspecified organism: Secondary | ICD-10-CM | POA: Diagnosis not present

## 2024-08-29 LAB — GLUCOSE, CAPILLARY
Glucose-Capillary: 104 mg/dL — ABNORMAL HIGH (ref 70–99)
Glucose-Capillary: 108 mg/dL — ABNORMAL HIGH (ref 70–99)
Glucose-Capillary: 94 mg/dL (ref 70–99)
Glucose-Capillary: 94 mg/dL (ref 70–99)
Glucose-Capillary: 98 mg/dL (ref 70–99)

## 2024-08-29 LAB — COMPREHENSIVE METABOLIC PANEL WITH GFR
ALT: 11 U/L (ref 0–44)
AST: 32 U/L (ref 15–41)
Albumin: 3.4 g/dL — ABNORMAL LOW (ref 3.5–5.0)
Alkaline Phosphatase: 76 U/L (ref 38–126)
Anion gap: 11 (ref 5–15)
BUN: 27 mg/dL — ABNORMAL HIGH (ref 8–23)
CO2: 26 mmol/L (ref 22–32)
Calcium: 9.3 mg/dL (ref 8.9–10.3)
Chloride: 102 mmol/L (ref 98–111)
Creatinine, Ser: 1.03 mg/dL (ref 0.61–1.24)
GFR, Estimated: >60 mL/min
Glucose, Bld: 94 mg/dL (ref 70–99)
Potassium: 3.9 mmol/L (ref 3.5–5.1)
Sodium: 139 mmol/L (ref 135–145)
Total Bilirubin: 1.7 mg/dL — ABNORMAL HIGH (ref 0.0–1.2)
Total Protein: 6.8 g/dL (ref 6.5–8.1)

## 2024-08-29 MED ORDER — CARBIDOPA-LEVODOPA 25-100 MG PO TABS
1.0000 | ORAL_TABLET | ORAL | Status: AC
  Administered 2024-08-29 – 2024-09-02 (×11): 1 via ORAL
  Filled 2024-08-29 (×11): qty 1

## 2024-08-29 NOTE — Progress Notes (Signed)
" °   08/29/24 2300  BiPAP/CPAP/SIPAP  BiPAP/CPAP/SIPAP Pt Type Adult  Reason BIPAP/CPAP not in use Non-compliant (Pt refusing CPAP)    "

## 2024-08-29 NOTE — Evaluation (Signed)
 Clinical/Bedside Swallow Evaluation Patient Details  Name: Richard Sandoval MRN: [id number] Date of Birth: 03/05/1946  Today's Date: 08/29/2024 Time: SLP Start Time (ACUTE ONLY): 1005 SLP Stop Time (ACUTE ONLY): 1015 SLP Time Calculation (min) (ACUTE ONLY): 10 min  Past Medical History:  Past Medical History:  Diagnosis Date   Acute on chronic combined systolic and diastolic CHF (congestive heart failure) (HCC) 09/06/2021   CAD in native artery 09/06/2021   Chronic combined systolic and diastolic heart failure (HCC) 09/06/2021   DVT (deep venous thrombosis) (HCC)    L leg in 2009   Dyspnea    GERD (gastroesophageal reflux disease)    Hypertension    enlarged heart   Morbid obesity (HCC)    OSA (obstructive sleep apnea)    on CPAP since 1995   Pancreatitis    Pulmonary hypertension (HCC) 06/04/2017   Right heart failure (HCC) 08/27/2017   Past Surgical History:  Past Surgical History:  Procedure Laterality Date   BACK SURGERY  04/09/1999   cervical laminectomy and fusion     CHOLECYSTECTOMY N/A 01/25/2014   Procedure: LAPAROSCOPIC CHOLECYSTECTOMY WITH INTRAOPERATIVE CHOLANGIOGRAM;  Surgeon: Krystal CHRISTELLA Spinner, MD;  Location: [facility];  Service: General;  Laterality: N/A;   COLONOSCOPY     COLONOSCOPY WITH PROPOFOL  N/A 03/04/2019   Procedure: COLONOSCOPY WITH PROPOFOL ;  Surgeon: Dianna Specking, MD;  Location: [facility];  Service: Endoscopy;  Laterality: N/A;   CORONARY PRESSURE/FFR STUDY N/A 09/11/2021   Procedure: INTRAVASCULAR PRESSURE WIRE/FFR STUDY;  Surgeon: Wendel Lurena POUR, MD;  Location: [facility];  Service: Cardiovascular;  Laterality: N/A;   EUS N/A 01/24/2014   Procedure: UPPER ENDOSCOPIC ULTRASOUND (EUS) RADIAL;  Surgeon: Elsie Cree, MD;  Location: [facility];  Service: Endoscopy;  Laterality: N/A;   KNEE SURGERY  1998   left inguinal hernia     LUMBAR LAMINECTOMY     RIGHT/LEFT HEART CATH AND CORONARY ANGIOGRAPHY N/A 09/11/2021   Procedure: RIGHT/LEFT HEART CATH AND  CORONARY ANGIOGRAPHY;  Surgeon: Wendel Lurena POUR, MD;  Location: [facility];  Service: Cardiovascular;  Laterality: N/A;   TRIGGER FINGER RELEASE  08/04/2012   Procedure: MINOR RELEASE TRIGGER FINGER/A-1 PULLEY;  Surgeon: Arley JONELLE Curia, MD;  Location: [facility];  Service: Orthopedics;  Laterality: Right;   HPI:  Richard Sandoval is a 79 y.o. male with history of Parkinson's disease, who was brought to the ER on 08/19/24 due to increasing weakness and confusion.  Recently treated for influenza with pneumonia.On presentation, patient was confused, hypothermic.  CT chest abdomen pelvis showed multifocal pneumonia with concern for aspiration, with bronchitis. BSE completed by SLP on 08/25/24 recommending regular solids, thin liquids and no f/u. SLP reordered for cognitive evaluation as well as re-assessment of his swallow function. PMH: chronic HFrEF, sleep apnea, history of gallstone pancreatitis, history of PE/DVT on Xarelto .    Assessment / Plan / Recommendation  Clinical Impression  Regular solids, thin liquids (finger foods) SLP will plan to f/u at least one time to ensure toleration and complete education with family/caregivers as needed.  Patient did not present with any clinical s/s of dysphagia as per this bedside swallow evaluation. He does present with a significant cognitive impairment which impacts his ability to safely and effectively manage liquid and solid PO's, appearing to do best when using his hands rather than utensils. SLP is recommending to continue regular solids, thin liquids but adjusted diet to 'finger foods' for ease in self-feeding.   SLP Visit Diagnosis: Dysphagia,  unspecified (R13.10)    Aspiration Risk  Mild aspiration risk    Diet Recommendation Thin liquid;Regular    Liquid Administration via: Straw;Cup    Other Recommendations       Swallow Evaluation Recommendations     Assistance Recommended at Discharge    Functional Status Assessment     Frequency and Duration            Prognosis        Swallow Study   General Date of Onset: 08/29/24 HPI: Richard Sandoval is a 79 y.o. male with history of Parkinson's disease, who was brought to the ER on 08/19/24 due to increasing weakness and confusion.  Recently treated for influenza with pneumonia.On presentation, patient was confused, hypothermic.  CT chest abdomen pelvis showed multifocal pneumonia with concern for aspiration, with bronchitis. BSE completed by SLP on 08/25/24 recommending regular solids, thin liquids and no f/u. SLP reordered for cognitive evaluation as well as re-assessment of his swallow function. PMH: chronic HFrEF, sleep apnea, history of gallstone pancreatitis, history of PE/DVT on Xarelto . Type of Study: Bedside Swallow Evaluation Previous Swallow Assessment: 08/25/2024 Diet Prior to this Study: Regular;Thin liquids (Level 0) Temperature Spikes Noted: No Respiratory Status: Room air History of Recent Intubation: No Behavior/Cognition: Alert;Cooperative;Pleasant mood;Confused Oral Cavity Assessment: Within Functional Limits Oral Care Completed by SLP: No Oral Cavity - Dentition: Missing dentition Vision: Functional for self-feeding Self-Feeding Abilities: Needs assist;Needs set up Patient Positioning: Upright in bed Baseline Vocal Quality: Normal Volitional Cough: Cognitively unable to elicit Volitional Swallow: Unable to elicit    Oral/Motor/Sensory Function Overall Oral Motor/Sensory Function: Within functional limits   Ice Chips     Thin Liquid Thin Liquid: Within functional limits Presentation: Self Fed    Nectar Thick     Honey Thick     Puree Puree: Within functional limits Presentation: Spoon   Solid     Solid: Within functional limits      Norleen IVAR Blase, MA, CCC-SLP Speech Therapy  08/29/2024,11:37 AM

## 2024-08-29 NOTE — Progress Notes (Signed)
 Physical Therapy Treatment Patient Details Name: Richard Sandoval MRN: [id number] DOB: [birth year] Today's Date: 08/29/2024   History of Present Illness Mr. Allemand is a 79 yr old male admitted to the hospital 08-19-24 with weakness, confusion, and cough. He was found to have severe sepsis, multi-focal PNA, acute encephalopathy, hypothermia, and moderate protein calorie malnutrition. PMH: Parkinson's disease, chronic heart failure, sleep apnea, gallstone pancreatitis, PE/DVT, CAD, HTN, obesity, pulmonary HTN, back surgery    PT Comments   Patient's family present. Patient awake, requires max/total assistance for mobility. Patient assisted to sittingonto bed edge. Assisted with balance  activities, leaning forward and to left. Attempted to lean down to left elbow but unable to follow direction to get to position.  Patient demonstrates brief intermittent  sitting balance control, then will  drift and does not appear engaged in activity.  Stimuli [provided to get  patient back  on track. Patient remained in sitting x ~ 10. Assisted back into bed. Patient will require maximove lift for OOB at this time due to balance and weakness. Patient will benefit from continued inpatient follow up therapy, <3 hours/day    If plan is discharge home, recommend the following: Two people to help with walking and/or transfers;Two people to help with bathing/dressing/bathroom;Assist for transportation;Help with stairs or ramp for entrance   Can travel by private vehicle        Equipment Recommendations       Recommendations for Other Services       Precautions / Restrictions Precautions Precautions: Fall Restrictions Other Position/Activity Restrictions: NG tube     Mobility  Bed Mobility   Bed Mobility: Supine to Sit, Sit to Supine, Rolling Rolling: Max assist, +2 for physical assistance, +2 for safety/equipment, Used rails   Supine to sit: Total assist, +2 for physical assistance, +2 for  safety/equipment, HOB elevated, Used rails     General bed mobility comments: required increased time and effort for supine to sit, increased assist and cues for advancing BLE, assist needed for trunk as well. slide sheet under pads to facilitate turning and moving to bed edge then back to supine.    Transfers                   General transfer comment: deferred today, given pt's impaired sitting balance EOB and body habitus    Ambulation/Gait                   Stairs             Wheelchair Mobility     Tilt Bed    Modified Rankin (Stroke Patients Only)       Balance Overall balance assessment: Needs assistance Sitting-balance support: Bilateral upper extremity supported, Feet supported Sitting balance-Leahy Scale: Poor Sitting balance - Comments: frequent posterior leaning, requiring increased cues and up to moderate assistance to correct; pt able to maintain static sitting for brief instances at CGA level                                    Communication Communication Communication: Impaired Factors Affecting Communication: Difficulty expressing self;Reduced clarity of speech  Cognition Arousal: Alert Behavior During Therapy: WFL for tasks assessed/performed, Flat affect   PT - Cognitive impairments: Difficult to assess, Orientation, Awareness, Attention, Problem solving Difficult to assess due to: Impaired communication Orientation impairments: Place, Time, Situation  PT - Cognition Comments: nonsensible statements, tends to drift off, then comes back to attention. Following commands: Impaired Following commands impaired: Follows one step commands inconsistently    Cueing Cueing Techniques: Verbal cues, Gestural cues, Tactile cues  Exercises      General Comments        Pertinent Vitals/Pain Pain Assessment Faces Pain Scale: No hurt    Home Living     Available Help at Discharge:  Family Type of Home: House                  Prior Function            PT Goals (current goals can now be found in the care plan section) Progress towards PT goals: Progressing toward goals    Frequency    Min 2X/week      PT Plan      Co-evaluation              AM-PAC PT 6 Clicks Mobility   Outcome Measure  Help needed turning from your back to your side while in a flat bed without using bedrails?: A Little Help needed moving from lying on your back to sitting on the side of a flat bed without using bedrails?: Total Help needed moving to and from a bed to a chair (including a wheelchair)?: Total Help needed standing up from a chair using your arms (e.g., wheelchair or bedside chair)?: Total Help needed to walk in hospital room?: Total Help needed climbing 3-5 steps with a railing? : Total 6 Click Score: 8    End of Session   Activity Tolerance: Patient tolerated treatment well Patient left: in bed;with call bell/phone within reach;with bed alarm set;with family/visitor present Nurse Communication: Mobility status;Need for lift equipment (maximove) PT Visit Diagnosis: Unsteadiness on feet (R26.81);Muscle weakness (generalized) (M62.81);Difficulty in walking, not elsewhere classified (R26.2)     Time: 8641-8567 PT Time Calculation (min) (ACUTE ONLY): 34 min  Charges:    $Therapeutic Activity: 23-37 mins                       Darice Potters PT Acute Rehabilitation Services Office [phone]    Potters Darice Norris 08/29/2024, 2:51 PM

## 2024-08-29 NOTE — Progress Notes (Signed)
 " [facility] Progress Note  Patient: Richard Sandoval     FMW:[id number]  DOA: 08/19/2024   PCP: [name], MD       Brief hospital course: This is a 79 year old male with Parkinson disease, history of PE/DVT, chronic HFrEF, gallstone pancreatitis, sleep apnea brought to the hospital on 1/23 for forgetfulness, increased trouble with walking, generalized weakness progressing over 2 weeks.  His wife noted poor oral intake over the past several weeks as well.  The patient had influenza pneumonia about 3 weeks prior and has been treated with antibiotics.  In ED: Temperature noted to be 90 degrees rectally. CT scan of the chest revealed multiple focus of ground glass infiltrates throughout both lungs with bronchial wall thickening consistent with bronchitis, fatty infiltration of the pancreas, moderate bilateral pleural effusions CT head and MRI were unrevealing  He was started on empiric antibiotics for pneumonia but remained encephalopathic.    1/25: Cefepime  changed to ceftriaxone  as the patient's encephalopathy was ongoing 1/26: Core track placed and tube feeds started as he remained encephalopathic and unable to eat 1/28 and neurology consult was requested for persistent encephalopathy.  This revealed that he had at least 2 months of cognitive decline prior to coming to the hospital.   EEG revealed generalized cerebral dysfunction/encephalopathy  Subjective:  Alert today. No complaints  Assessment and Plan: Principal Problem:   Multifocal pneumonia with sepsis and hypothermia - Completed 7 days of antibiotics  Active Problems: Acute metabolic encephalopathy- underlying dementia - Neurology has recommended an outpatient follow-up for further management - Suspect he was developing dementia (related to Parkinson's?) and current illness has resulted in the more rapid decline that brought him to the hospital  - has started to eat and was able to eat breakfast - cannot feed  himself  Nutrition  - Tube feeds off- following oral intake  Atrial flutter - Noted during this hospital stay and alternating with sinus arrhythmia - Rate controlled - Continue Xarelto  which he takes for PE/DVT history  Fever on 1/27 - Low-grade temperature of 100.2 - Continue to follow for further fevers    OSA (obstructive sleep apnea) - Continue to attempt CPAP at night    Pancreatitis? - Admission CT scan revealed: Fatty infiltration of the pancreas with mild peripancreatic stranding, possibly early acute pancreatitis, without loculated collection. -Has been tolerating tube feeds    Chronic combined systolic and diastolic heart failure - Noted to have bilateral pleural effusions on imaging when he was admitted - Last 2D echo from 06/27/2024 revealed an EF of 55 to 60% with grade 2 diastolic dysfunction - He has been receiving Lasix  in the hospital - Continue to follow and adjust Lasix  as needed    CAD in native artery - Cardiac cath from 2023 revealed mild nonobstructive stenosis    Parkinson's disease - Continue Sinemet   History of PE/DVT - Can continue Xarelto     Code Status: Full Code Total time on patient care: 35 minutes DVT prophylaxis:   rivaroxaban  (XARELTO ) tablet 20 mg     Objective:   Vitals:   08/28/24 0500 08/28/24 1628 08/28/24 1940 08/29/24 0527  BP:  128/67 124/70 105/75  Pulse:  66 68 70  Resp:  16 18 18   Temp:  98.5 F (36.9 C) 98.5 F (36.9 C) 98 F (36.7 C)  TempSrc:      SpO2:  98% 98% 99%  Weight: 130.7 kg      Filed Weights   08/25/24 0408 08/26/24 0500 08/28/24 0500  Weight: (!) 142.6 kg (!) 137.3 kg 130.7 kg   Exam: General exam: Appears comfortable HEENT: oral mucosa moist Respiratory system: Clear to auscultation.  Cardiovascular system: S1 & S2 heard Neurologic: Oriented only to person, able to follow commands and move extremities Gastrointestinal system: Abdomen soft, non-tender, nondistended. Normal bowel sounds    Extremities: No cyanosis, clubbing or edema Psychiatry:  Mood & affect appropriate.      CBC: Recent Labs  Lab 08/23/24 0321 08/24/24 0310 08/25/24 0250 08/26/24 0709 08/27/24 0652  WBC 4.7 4.8 5.2 4.8 6.1  HGB 12.7* 12.4* 13.5 13.3 13.3  HCT 39.9 37.8* 42.8 42.0 42.7  MCV 92.4 90.2 94.1 92.3 93.8  PLT 74* 69* 83* 104* 128*   Basic Metabolic Panel: Recent Labs  Lab 08/23/24 0321 08/24/24 0310 08/25/24 0250 08/26/24 0709 08/27/24 0652 08/29/24 0505  NA 143 141 141 142 140 139  K 3.8 3.7 4.5 4.2 4.5 3.9  CL 106 105 105 105 104 102  CO2 26 26 27 28 28 26   GLUCOSE 74 103* 108* 116* 100* 94  BUN 18 22 22 23 22  27*  CREATININE 1.19 1.06 1.04 0.94 0.90 1.03  CALCIUM  9.1 8.8* 9.1 8.9 9.2 9.3  MG 1.8 1.9 2.2 2.2  --   --   PHOS 2.4* 2.4* 3.0 3.1  --   --      Scheduled Meds:  carbidopa -levodopa   1 tablet Oral 3 times per day   Chlorhexidine  Gluconate Cloth  6 each Topical Daily   feeding supplement (PROSource TF20)  60 mL Per Tube Daily   furosemide   40 mg Intravenous Daily   nystatin  cream   Topical BID   mouth rinse  15 mL Mouth Rinse 4 times per day   rivaroxaban   20 mg Oral Q supper   tamsulosin   0.4 mg Oral Daily    Imaging and lab data personally reviewed   Author: [employer]  08/29/2024 11:24 AM  To contact [facility]>   Check the care team in [facility] and look for the attending/consulting [facility] provider listed  Log into [email or web address] and use [place]'s universal password   Go to> [facility]  and find provider  If you still have difficulty reaching the provider, please page the [facility] (Director on Call) for the Hospitalists listed on amion     "

## 2024-08-29 NOTE — Discharge Instructions (Addendum)
Information on my medicine - XARELTO® (rivaroxaban) ° °This medication education was reviewed with me or my healthcare representative as part of my discharge preparation.  ° °WHY WAS XARELTO® PRESCRIBED FOR YOU? °Xarelto® was prescribed to treat blood clots that may have been found in the veins of your legs (deep vein thrombosis) or in your lungs (pulmonary embolism) and to reduce the risk of them occurring again. ° °What do you need to know about Xarelto®? °The dose is one 20 mg tablet taken ONCE A DAY with your evening meal. ° °DO NOT stop taking Xarelto® without talking to the health care provider who prescribed the medication.  Refill your prescription for 20 mg tablets before you run out. ° °After discharge, you should have regular check-up appointments with your healthcare provider that is prescribing your Xarelto®.  In the future your dose may need to be changed if your kidney function changes by a significant amount. ° °What do you do if you miss a dose? °If you are taking Xarelto® TWICE DAILY and you miss a dose, take it as soon as you remember. You may take two 15 mg tablets (total 30 mg) at the same time then resume your regularly scheduled 15 mg twice daily the next day. ° °If you are taking Xarelto® ONCE DAILY and you miss a dose, take it as soon as you remember on the same day then continue your regularly scheduled once daily regimen the next day. Do not take two doses of Xarelto® at the same time.  ° °Important Safety Information °Xarelto® is a blood thinner medicine that can cause bleeding. You should call your healthcare provider right away if you experience any of the following: °? Bleeding from an injury or your nose that does not stop. °? Unusual colored urine (red or dark brown) or unusual colored stools (red or black). °? Unusual bruising for unknown reasons. °? A serious fall or if you hit your head (even if there is no bleeding). ° °Some medicines may interact with Xarelto® and might increase  your risk of bleeding while on Xarelto®. To help avoid this, consult your healthcare provider or pharmacist prior to using any new prescription or non-prescription medications, including herbals, vitamins, non-steroidal anti-inflammatory drugs (NSAIDs) and supplements. ° °This website has more information on Xarelto®: www.xarelto.com. ° °

## 2024-08-30 ENCOUNTER — Other Ambulatory Visit: Payer: Self-pay

## 2024-08-30 LAB — GLUCOSE, CAPILLARY
Glucose-Capillary: 101 mg/dL — ABNORMAL HIGH (ref 70–99)
Glucose-Capillary: 96 mg/dL (ref 70–99)
Glucose-Capillary: 106 mg/dL — ABNORMAL HIGH (ref 70–99)
Glucose-Capillary: 108 mg/dL — ABNORMAL HIGH (ref 70–99)

## 2024-08-30 MED ORDER — OLANZAPINE 5 MG PO TBDP
5.0000 mg | ORAL_TABLET | Freq: Every day | ORAL | Status: DC
  Filled 2024-08-30: qty 1

## 2024-08-30 MED ORDER — HALOPERIDOL LACTATE 5 MG/ML IJ SOLN
2.0000 mg | Freq: Once | INTRAMUSCULAR | Status: AC
  Administered 2024-08-30: 2 mg via INTRAVENOUS
  Filled 2024-08-30: qty 1

## 2024-08-30 MED ORDER — HALOPERIDOL LACTATE 5 MG/ML IJ SOLN
2.0000 mg | Freq: Four times a day (QID) | INTRAMUSCULAR | Status: AC | PRN
  Administered 2024-09-01: 2 mg via INTRAVENOUS
  Filled 2024-08-30: qty 1

## 2024-08-30 MED ORDER — MELATONIN 3 MG PO TABS
3.0000 mg | ORAL_TABLET | Freq: Every day | ORAL | Status: AC
  Administered 2024-08-31 – 2024-09-02 (×3): 3 mg via ORAL
  Filled 2024-08-30 (×4): qty 1

## 2024-08-30 MED ORDER — QUETIAPINE FUMARATE 25 MG PO TABS
12.5000 mg | ORAL_TABLET | Freq: Every day | ORAL | Status: AC
  Administered 2024-08-31 – 2024-09-02 (×3): 12.5 mg via ORAL
  Filled 2024-08-30 (×4): qty 1

## 2024-08-30 NOTE — Progress Notes (Signed)
" °   08/30/24 2150  BiPAP/CPAP/SIPAP  BiPAP/CPAP/SIPAP Pt Type Adult  BiPAP/CPAP/SIPAP Resmed  Mask Type Full face mask  Dentures removed? Not applicable  Mask Size Large  Respiratory Rate 22 breaths/min  PEEP 10 cmH20  Patient Home Machine No  Patient Home Mask No  Patient Home Tubing No  Auto Titrate No  Device Plugged into RED Power Outlet Yes  BiPAP/CPAP /SiPAP Vitals  Pulse Rate 64  Resp (!) 22  Bilateral Breath Sounds Diminished  MEWS Score/Color  MEWS Score 1  MEWS Score Color Green    "

## 2024-08-30 NOTE — Progress Notes (Signed)
 Patient swallow evaluated this AM and patient immediately started coughing after one sip of water. This nurse encouraged the patient to cough and deep breath but patient refuses to follow commands.

## 2024-08-30 NOTE — Progress Notes (Signed)
 This nurse is attempting to address patient's pain/requests medication IV for agitation as patient refuses PO meds. During patient peri care, patient spit at this nurse.

## 2024-08-30 NOTE — TOC Progression Note (Signed)
 Transition of Care [facility]) - Progression Note    Patient Details  Name: Richard Sandoval MRN: [id number] Date of Birth: May 11, 1946  [facility] CM/SW Contact  Tawni CHRISTELLA Eva, LCSW Phone Number: 08/30/2024, 11:18 AM  Clinical Narrative:     CSW spoke with pt's wife to discuss rec for SNF placement. She is agreeable to placement. CSW explained the process noting pt will need insurance authorization as pt's Medicare is secondary. CSW to fax pt out for SNF. ICM to follow.   Expected Discharge Plan: Skilled Nursing Facility Barriers to Discharge: Continued Medical Work up               Expected Discharge Plan and Services In-house Referral: NA Discharge Planning Services: CM Consult Post Acute Care Choice: Durable Medical Equipment Living arrangements for the past 2 months: Single Family Home                 DME Arranged: N/A DME Agency: NA       HH Arranged: NA HH Agency: NA         Social Drivers of Health (SDOH) Interventions SDOH Screenings   Food Insecurity: Patient Unable To Answer (08/20/2024)  Housing: Patient Unable To Answer (08/20/2024)  Transportation Needs: Patient Unable To Answer (08/20/2024)  Utilities: Patient Unable To Answer (08/20/2024)  Social Connections: Patient Unable To Answer (08/20/2024)  Tobacco Use: Low Risk (08/19/2024)    Readmission Risk Interventions    08/22/2024    3:18 PM  Readmission Risk Prevention Plan  Post Dischage Appt Complete  Medication Screening Complete  Transportation Screening Complete

## 2024-08-30 NOTE — Plan of Care (Signed)

## 2024-08-30 NOTE — Progress Notes (Signed)
 Nutrition Follow-up  DOCUMENTATION CODES:   Morbid obesity  INTERVENTION:   -If pt continues to not eat, need to consider replacement of Cortrak tube.  When accepting PO and meds: -Ensure Plus High Protein po BID, each supplement provides 350 kcal and 20 grams of protein.  -Multivitamin with minerals daily  NUTRITION DIAGNOSIS:   Inadequate oral intake related to inability to eat, lethargy/confusion as evidenced by meal completion < 25%.  Ongoing.  GOAL:   Patient will meet greater than or equal to 90% of their needs  Not meeting.  MONITOR:   Diet advancement, Labs, Weight trends, TF tolerance  ASSESSMENT:   79 y.o. male with PMH of Parkinson's disease, chronic HFrEF, sleep apnea, history of gallstone pancreatitis, history of PE/DVT on Xarelto  who presented due to increasing weakness and confusion. Admitted for sepsis, encephalopathy, and PNA.  1/23 Admit 1/24 SLP eval unable to be completed due to patient being lethargic 1/26 SLP eval unable to be completed due to patient being on BiPAP 1/26 Cortrak placed (tip in stomach); TF initiated 1/31 Tube feeds turned off 2/2 Cortrak removed, pt on finger foods diet  Patient consumed 5-50% of meals yesterday. Noted that pt is not eating well today. More confused, agitated and yelling at staff. Not following commands. Palliative care was consulted for GOC.   Recommend supplements but will wait to see if safe to consume PO as RN reports coughing with sips. May need to have Cortrak replaced if continues to refuse POs.  Admission weight: 296 lbs Current weight: 290 lbs  Medications: Sinemet   Labs reviewed: CBGs: 96-106   Diet Order:   Diet Order             DIET FINGER FOODS Room service appropriate? Yes; Fluid consistency: Thin  Diet effective now                   EDUCATION NEEDS:   Not appropriate for education at this time  Skin:  Skin Assessment: Reviewed RN Assessment  Last BM:  1/27 - type  5  Height:   Ht Readings from Last 1 Encounters:  05/30/24 5' 10 (1.778 m)    Weight:   Wt Readings from Last 1 Encounters:  08/30/24 131.6 kg    Ideal Body Weight:  75.45 kg  BMI:  Body mass index is 41.63 kg/m.  Estimated Nutritional Needs:   Kcal:  2100-2300 kcals  Protein:  105-120 grams  Fluid:  >/= 2.1L   Morna Lee, MS, RD, LDN Inpatient Clinical Dietitian Contact via Secure chat

## 2024-08-31 MED ORDER — ENSURE PLUS HIGH PROTEIN PO LIQD
237.0000 mL | Freq: Two times a day (BID) | ORAL | Status: AC
  Administered 2024-08-31 – 2024-09-02 (×5): 237 mL via ORAL

## 2024-08-31 NOTE — TOC Progression Note (Signed)
 Transition of Care [facility]) - Progression Note    Patient Details  Name: Richard Sandoval MRN: [id number] Date of Birth: 03-Sep-1945  Transition of Care [facility]) CM/SW Contact  Tawni CHRISTELLA Eva, LCSW Phone Number: 08/31/2024, 12:09 PM  Clinical Narrative:     CSW spoke with pt's spouse Evette to present bed offers for SNF placement. She is requesting time to review. ICM to follow.      Expected Discharge Plan: Skilled Nursing Facility Barriers to Discharge: Continued Medical Work up               Expected Discharge Plan and Services In-house Referral: NA Discharge Planning Services: CM Consult Post Acute Care Choice: Durable Medical Equipment Living arrangements for the past 2 months: Single Family Home                 DME Arranged: N/A DME Agency: NA       HH Arranged: NA HH Agency: NA         Social Drivers of Health (SDOH) Interventions SDOH Screenings   Food Insecurity: Patient Unable To Answer (08/20/2024)  Housing: Patient Unable To Answer (08/20/2024)  Transportation Needs: Patient Unable To Answer (08/20/2024)  Utilities: Patient Unable To Answer (08/20/2024)  Social Connections: Patient Unable To Answer (08/20/2024)  Tobacco Use: Low Risk (08/19/2024)    Readmission Risk Interventions    08/22/2024    3:18 PM  Readmission Risk Prevention Plan  Post Dischage Appt Complete  Medication Screening Complete  Transportation Screening Complete

## 2024-08-31 NOTE — Progress Notes (Signed)
 " PROGRESS NOTE    Richard Sandoval  FMW:[id number] DOB: 08/14/1945 DOA: 08/19/2024 PCP: [name], MD  Chief Complaint  Patient presents with   Altered Mental Status    Brief Narrative:   This is Richard Sandoval 79 year old male with Parkinson disease, history of PE/DVT, chronic HFrEF, gallstone pancreatitis, sleep apnea brought to the hospital on 1/23 for forgetfulness, increased trouble with walking, generalized weakness progressing over 2 weeks.  His wife noted poor oral intake over the past several weeks as well.  The patient had influenza pneumonia about 3 weeks prior and has been treated with antibiotics.   In ED: Temperature noted to be 90 degrees rectally. CT scan of the chest revealed multiple focus of ground glass infiltrates throughout both lungs with bronchial wall thickening consistent with bronchitis, fatty infiltration of the pancreas, moderate bilateral pleural effusions CT head and MRI were unrevealing   He was started on empiric antibiotics for pneumonia but remained encephalopathic.    1/25: Cefepime  changed to ceftriaxone  as the patient's encephalopathy was ongoing 1/26: Core track placed and tube feeds started as he remained encephalopathic and unable to eat 1/28 and neurology consult was requested for persistent encephalopathy.  This revealed that he had at least 2 months of cognitive decline prior to coming to the hospital.   EEG revealed generalized cerebral dysfunction/encephalopathy 2/3 becoming agitated- antipsychotics started after speaking with family- wife would prefer he go to SNF as opposed to returning home    Assessment & Plan:   Principal Problem:   Multifocal pneumonia Active Problems:   OSA (obstructive sleep apnea)   Pancreatitis   Chronic combined systolic and diastolic heart failure (HCC)   CAD in native artery   Sepsis (HCC)   Parkinson's disease (HCC)   Hypothermia   Acute metabolic encephalopathy   History of pulmonary embolism    Thrombocytopenia   Altered mental status  Goals of care Appreciate palliative assistance  Multifocal pneumonia with sepsis and hypothermia - CT chest with multifocal pneumonia vs aspiration with associated bronchial wall thickening c/w bronchitis - Completed 7 days of antibiotics   Acute metabolic encephalopathy Dementia  - seen by neurology earlier during this course, thought to have multifactorial encephalopathy related t infection, fevere, cefepime , baseline cognitive impairment.  Recommending sinemet  and outpatient neurology follow up with Dr. [name]  Neurology signed off on 1/30.  - MRI 1/30 without acute or recent infarction, age commensurate cerebral volume loss - delirium precautions - appreciate palliative recs for agitation/encephalopathy   Nutrition NG tube removed Follow PO intake, seen eating breakfast this AM   Atrial flutter - Noted on telemetry during this hospital stay   - Rate controlled - Continue Xarelto  which he takes for h/o PE/DVT     Fever  - noted earlier in hospitalization  - will continue to monitor - fever curve appears to have improved on abx   Chronic combined systolic and diastolic heart failure - Noted to have bilateral pleural effusions on imaging when he was admitted - Last 2D echo from 06/27/2024 revealed an EF of 55 to 60% with grade 2 diastolic dysfunction - lasix  is currently on hold, will evaluate for resumption   Pancreatitis Unclear clinical significance, CT at presentation with possible early acute pancreatitis Lipase not consistent with this  CAD in native artery - Cardiac cath from 2023 revealed mild nonobstructive stenosis    Parkinson's disease - Continue Sinemet  - PT eval shows he is Elisheva Fallas total assist - family is opting for SNF   History  of PE/DVT - Can continue Xarelto    Acute thrombocytopenia - platelets low when admitted - will continue repeat labs intermittently - improving as of 1/31  OSA (obstructive sleep apnea) -  Continue to attempt CPAP at night     DVT prophylaxis: xarelto  Code Status: full Family Communication: none Disposition:   Status is: Inpatient Remains inpatient appropriate because: need for continued inpt care, safe d/c plan   Consultants:  Palliative care  Procedures:  none  Antimicrobials:  Anti-infectives (From admission, onward)    Start     Dose/Rate Route Frequency Ordered Stop   08/24/24 2000  azithromycin  (ZITHROMAX ) tablet 500 mg        500 mg Per Tube Every 24 hours 08/24/24 1137 08/25/24 2035   08/21/24 2000  doxycycline  (VIBRA -TABS) tablet 100 mg  Status:  Discontinued        100 mg Oral Every 12 hours 08/21/24 0916 08/21/24 1427   08/21/24 2000  doxycycline  (VIBRAMYCIN ) 100 mg in sodium chloride  0.9 % 250 mL IVPB  Status:  Discontinued        100 mg 125 mL/hr over 120 Minutes Intravenous Every 12 hours 08/21/24 1427 08/24/24 1137   08/21/24 1000  cefTRIAXone  (ROCEPHIN ) 2 g in sodium chloride  0.9 % 100 mL IVPB        2 g 200 mL/hr over 30 Minutes Intravenous Daily 08/21/24 0935 08/25/24 1058   08/20/24 1200  vancomycin  (VANCOCIN ) 2,250 mg in sodium chloride  0.9 % 500 mL IVPB  Status:  Discontinued        2,250 mg 261.3 mL/hr over 120 Minutes Intravenous Every 24 hours 08/19/24 2343 08/20/24 1031   08/20/24 0800  doxycycline  (VIBRAMYCIN ) 100 mg in sodium chloride  0.9 % 250 mL IVPB  Status:  Discontinued        100 mg 125 mL/hr over 120 Minutes Intravenous Every 12 hours 08/19/24 2306 08/21/24 0916   08/20/24 0400  ceFEPIme  (MAXIPIME ) 2 g in sodium chloride  0.9 % 100 mL IVPB  Status:  Discontinued        2 g 200 mL/hr over 30 Minutes Intravenous Every 8 hours 08/19/24 2321 08/21/24 0935   08/19/24 1915  aztreonam (AZACTAM) 2 g in sodium chloride  0.9 % 100 mL IVPB  Status:  Discontinued        2 g 200 mL/hr over 30 Minutes Intravenous  Once 08/19/24 1900 08/19/24 1904   08/19/24 1915  metroNIDAZOLE  (FLAGYL ) IVPB 500 mg        500 mg 100 mL/hr over 60 Minutes  Intravenous  Once 08/19/24 1900 08/19/24 2039   08/19/24 1915  vancomycin  (VANCOCIN ) IVPB 1000 mg/200 mL premix        1,000 mg 200 mL/hr over 60 Minutes Intravenous  Once 08/19/24 1900 08/19/24 2222   08/19/24 1915  ceFEPIme  (MAXIPIME ) 2 g in sodium chloride  0.9 % 100 mL IVPB        2 g 200 mL/hr over 30 Minutes Intravenous  Once 08/19/24 1905 08/19/24 1954       Subjective: Non complaints  Objective: Vitals:   08/30/24 2025 08/30/24 2150 08/31/24 0500 08/31/24 0511  BP: 109/66   (!) 148/45  Pulse: 66 64  82  Resp: 18 (!) 22  17  Temp: 98.7 F (37.1 C)   99.4 F (37.4 C)  TempSrc: Oral   Oral  SpO2:  100%    Weight:   124.7 kg     Intake/Output Summary (Last 24 hours) at 08/31/2024 1551 Last data filed  at 08/31/2024 1500 Gross per 24 hour  Intake --  Output 930 ml  Net -930 ml   Filed Weights   08/28/24 0500 08/30/24 0405 08/31/24 0500  Weight: 130.7 kg 131.6 kg 124.7 kg    Examination:  General exam: Appears calm and comfortable  Respiratory system: unlabored Cardiovascular system: RRR Gastrointestinal system: Abdomen is nondistended, soft and nontender.  Central nervous system: pleasantly confused Extremities: no LEE   Data Reviewed: I have personally reviewed following labs and imaging studies  CBC: Recent Labs  Lab 08/25/24 0250 08/26/24 0709 08/27/24 0652  WBC 5.2 4.8 6.1  HGB 13.5 13.3 13.3  HCT 42.8 42.0 42.7  MCV 94.1 92.3 93.8  PLT 83* 104* 128*    Basic Metabolic Panel: Recent Labs  Lab 08/25/24 0250 08/26/24 0709 08/27/24 0652 08/29/24 0505  NA 141 142 140 139  K 4.5 4.2 4.5 3.9  CL 105 105 104 102  CO2 27 28 28 26   GLUCOSE 108* 116* 100* 94  BUN 22 23 22  27*  CREATININE 1.04 0.94 0.90 1.03  CALCIUM  9.1 8.9 9.2 9.3  MG 2.2 2.2  --   --   PHOS 3.0 3.1  --   --     GFR: Estimated Creatinine Clearance: 79.6 mL/min (by C-G formula based on SCr of 1.03 mg/dL).  Liver Function Tests: Recent Labs  Lab 08/25/24 0250  08/26/24 0709 08/27/24 0652 08/29/24 0505  AST 36 33 36 32  ALT 12 18 17 11   ALKPHOS 78 76 76 76  BILITOT 1.4* 1.3* 1.6* 1.7*  PROT 6.7 6.7 6.9 6.8  ALBUMIN  3.3* 3.2* 3.3* 3.4*    CBG: Recent Labs  Lab 08/29/24 2335 08/30/24 0345 08/30/24 0746 08/30/24 1121 08/30/24 1615  GLUCAP 98 101* 96 106* 108*     Recent Results (from the past 240 hours)  Culture, blood (Routine X 2) w Reflex to ID Panel     Status: None   Collection Time: 08/23/24 10:40 AM   Specimen: BLOOD RIGHT HAND  Result Value Ref Range Status   Specimen Description   Final    BLOOD RIGHT HAND Performed at [facility], [address] [place], [place] [address]    Special Requests   Final    BOTTLES DRAWN AEROBIC ONLY Blood Culture results may not be optimal due to an inadequate volume of blood received in culture bottles Performed at [facility], [address] [place], [place] [address]    Culture   Final    NO GROWTH 5 DAYS Performed at [facility], [address] [place], [place] [address]    Report Status 08/28/2024 FINAL  Final  Culture, blood (Routine X 2) w Reflex to ID Panel     Status: None   Collection Time: 08/23/24 10:43 AM   Specimen: BLOOD LEFT HAND  Result Value Ref Range Status   Specimen Description   Final    BLOOD LEFT HAND Performed at [facility], [address], [place], [place] [address]    Special Requests   Final    BOTTLES DRAWN AEROBIC ONLY Blood Culture results may not be optimal due to an inadequate volume of blood received in culture bottles Performed at [facility], [address], [place], [place] [address]    Culture   Final    NO GROWTH 5 DAYS Performed at [facility], [address] [place], [place] [address]    Report Status  08/28/2024 FINAL  Final  Resp panel by RT-PCR (RSV, Flu Xander Jutras&B, Covid) Anterior Nasal Swab     Status: None   Collection Time: 08/23/24  5:23 PM   Specimen: Anterior  Nasal Swab  Result Value Ref Range Status   SARS Coronavirus 2 by RT PCR NEGATIVE NEGATIVE Final    Comment: (NOTE) SARS-CoV-2 target nucleic acids are NOT DETECTED.  The SARS-CoV-2 RNA is generally detectable in upper respiratory specimens during the acute phase of infection. The lowest concentration of SARS-CoV-2 viral copies this assay can detect is 138 copies/mL. Habiba Treloar negative result does not preclude SARS-Cov-2 infection and should not be used as the sole basis for treatment or other patient management decisions. Brailyn Delman negative result may occur with  improper specimen collection/handling, submission of specimen other than nasopharyngeal swab, presence of viral mutation(s) within the areas targeted by this assay, and inadequate number of viral copies(<138 copies/mL). Farris Blash negative result must be combined with clinical observations, patient history, and epidemiological information. The expected result is Negative.  Fact Sheet for Patients:  bloggercourse.com  Fact Sheet for Healthcare Providers:  seriousbroker.it  This test is no t yet approved or cleared by the [place]  FDA and  has been authorized for detection and/or diagnosis of SARS-CoV-2 by FDA under an Emergency Use Authorization (EUA). This EUA will remain  in effect (meaning this test can be used) for the duration of the COVID-19 declaration under Section 564(b)(1) of the Act, 21 U.S.C.section 360bbb-3(b)(1), unless the authorization is terminated  or revoked sooner.       Influenza Jasalyn Frysinger by PCR NEGATIVE NEGATIVE Final   Influenza B by PCR NEGATIVE NEGATIVE Final    Comment: (NOTE) The Xpert Xpress SARS-CoV-2/FLU/RSV plus assay is intended as an aid in the diagnosis of influenza from Nasopharyngeal swab specimens and should not be used as Madisson Kulaga sole basis for treatment. Nasal washings and aspirates are unacceptable for Xpert Xpress SARS-CoV-2/FLU/RSV testing.  Fact Sheet for  Patients: bloggercourse.com  Fact Sheet for Healthcare Providers: seriousbroker.it  This test is not yet approved or cleared by the [place]  FDA and has been authorized for detection and/or diagnosis of SARS-CoV-2 by FDA under an Emergency Use Authorization (EUA). This EUA will remain in effect (meaning this test can be used) for the duration of the COVID-19 declaration under Section 564(b)(1) of the Act, 21 U.S.C. section 360bbb-3(b)(1), unless the authorization is terminated or revoked.     Resp Syncytial Virus by PCR NEGATIVE NEGATIVE Final    Comment: (NOTE) Fact Sheet for Patients: bloggercourse.com  Fact Sheet for Healthcare Providers: seriousbroker.it  This test is not yet approved or cleared by the [place]  FDA and has been authorized for detection and/or diagnosis of SARS-CoV-2 by FDA under an Emergency Use Authorization (EUA). This EUA will remain in effect (meaning this test can be used) for the duration of the COVID-19 declaration under Section 564(b)(1) of the Act, 21 U.S.C. section 360bbb-3(b)(1), unless the authorization is terminated or revoked.  Performed at [facility], [address] [place], [place] [address]          Radiology Studies: No results found.      Scheduled Meds:  carbidopa -levodopa   1 tablet Oral 3 times per day   Chlorhexidine  Gluconate Cloth  6 each Topical Daily   feeding supplement  237 mL Oral BID BM   melatonin  3 mg Oral QHS   mouth rinse  15 mL Mouth Rinse 4 times per day   QUEtiapine   12.5 mg Oral QHS   rivaroxaban   20 mg Oral Q supper   tamsulosin   0.4 mg Oral Daily   Continuous Infusions:   LOS: 12 days    Time spent: over 30 min     Meliton Monte, MD [facility]   To contact the attending provider between 7A-7P or the covering provider during after hours 7P-7A, please  log into the web site [email or web address] and access using universal [place] password for that web site. If you do not have the password, please call the hospital operator.  08/31/2024, 3:51 PM    "

## 2024-08-31 NOTE — Progress Notes (Signed)
 "                                                                                                                                                                                                          Daily Progress Note   Patient Name: Richard Sandoval       Date: 08/31/2024 DOB: 01/29/1946  Age: 79 y.o. MRN#: [id number] Attending Physician: Perri DELENA Meliton Mickey., * Primary Care Physician: [name], MD Admit Date: 08/19/2024  Reason for Consultation/Follow-up: Establishing goals of care agitation/encephalopathy management, feeding decisions, disposition planning   Subjective:  Awake alert, attempts to feed himself breakfast, calm, not agitated this am.   Length of Stay: 12  Current Medications: Scheduled Meds:   carbidopa -levodopa   1 tablet Oral 3 times per day   Chlorhexidine  Gluconate Cloth  6 each Topical Daily   melatonin  3 mg Oral QHS   mouth rinse  15 mL Mouth Rinse 4 times per day   QUEtiapine   12.5 mg Oral QHS   rivaroxaban   20 mg Oral Q supper   tamsulosin   0.4 mg Oral Daily    Continuous Infusions:   PRN Meds: acetaminophen  **OR** acetaminophen , haloperidol  lactate, sorbitol   Physical Exam         Awake alert No distress Not agitated Regular No edema S 1 S 2  Vital Signs: BP (!) 148/45 (BP Location: Left Arm)   Pulse 82   Temp 99.4 F (37.4 C) (Oral)   Resp 17   Wt 124.7 kg   SpO2 100%   BMI 39.45 kg/m  SpO2: SpO2: 100 % O2 Device: O2 Device: Room Air O2 Flow Rate:    Intake/output summary:  Intake/Output Summary (Last 24 hours) at 08/31/2024 1100 Last data filed at 08/31/2024 0900 Gross per 24 hour  Intake --  Output 740 ml  Net -740 ml   LBM: Last BM Date : 08/23/24 Baseline Weight: Weight: 134.3 kg Most recent weight: Weight: 124.7 kg       Palliative Assessment/Data:      Patient Active Problem List   Diagnosis Date Noted   Altered mental status 08/24/2024   Multifocal pneumonia 08/20/2024   Sepsis (HCC) 08/19/2024    Parkinson's disease (HCC) 08/19/2024   Hypothermia 08/19/2024   Acute metabolic encephalopathy 08/19/2024   History of pulmonary embolism 08/19/2024   Thrombocytopenia 08/19/2024   Chronic combined systolic and diastolic heart failure (HCC) 09/06/2021   CAD in native artery 09/06/2021   History of colonic polyps 03/04/2019   Lumbar stenosis with neurogenic claudication 12/07/2017   Right  heart failure (HCC) 08/27/2017   Pulmonary hypertension (HCC) 06/04/2017   GERD (gastroesophageal reflux disease) 02/21/2016   Bilateral pulmonary embolism (HCC) 02/20/2016   Hypokalemia 01/25/2014   Pancreatitis, gallstone 01/25/2014   Morbid obesity (HCC)    Pancreatitis 01/05/2014   Abnormal transaminases 01/05/2014   Dyspnea 12/15/2013   Upper airway cough syndrome 09/27/2013   Cellulitis and abscess 05/10/2012   Morbid (severe) obesity due to excess calories (HCC) 09/24/2006   DEPRESSIVE DISORDER, NOS 09/24/2006   BACK PAIN, LOW 09/24/2006   OSA (obstructive sleep apnea) 09/24/2006    Palliative Care Assessment & Plan   Patient Profile: Assessment  79 year old male with Parkinson disease, prior PE/DVT, chronic HFrEF, OSA, and recent influenza pneumonia, now with multifactorial encephalopathy superimposed on probable Parkinson disease-related dementia, manifested by agitation, functional decline, and inability to maintain nutrition independently. Course complicated by hypothermia, persistent cognitive impairment despite treatment of presumed infection, EEG with generalized cerebral dysfunction, and progressive decline reported by family predating this admission. Overall prognosis is guarded with high risk for continued cognitive and functional deterioration.  Palliative Care consulted for symptom management, goals of care clarification, and support with complex decision-making, including antipsychotic use and feeding considerations.  Plan 1. Goals of Care /  Decision-Making  Patient lacks medical decision-making capacity. Surrogate decision-maker: wife. Current goals emphasize comfort and quality of life, with preference for skilled nursing facility placement rather than return home.  PMT will continue to follow to clarify expectations regarding trajectory, feeding tube considerations, and long-term goals.  2. Agitation / Encephalopathy  Discontinue olanzapine  (Zyprexa ):  Dopamine D2 receptor blockade may worsen parkinsonism and motor symptoms; generally not recommended in Parkinson disease with dementia.  Initiate quetiapine  (Seroquel ): Lower D2 affinity; better tolerated in Parkinson disease when antipsychotic therapy is necessary for agitation or psychosis. Patient got one dose last night.   Haloperidol  PRN for severe agitation threatening patient or staff safety only; use sparingly given risk of extrapyramidal symptoms and worsening parkinsonism. Continue nonpharmacologic measures: reorientation, sleep-wake cycle support, minimizing restraints and deliriogenic medications.  3. Nutrition / Feeding  NG tube removed on 2/2. Nutrition recommends Cortrak reinsertion due to poor oral intake and inability to self-feed.  Wife prefers continued careful hand-assisted feeding at this time. Reviewed risks and benefits of feeding tube reinsertion with family, including aspiration risk, discomfort, and limited impact on cognitive recovery. Support comfort-focused oral intake, meticulous oral care, and reassess feeding decisions as goals evolve.  4. Prognosis  Not immediately terminal, but prognosis remains guarded given underlying neurodegenerative disease, encephalopathy, and functional dependence. High likelihood of continued decline and recurrent hospitalizations.  5. Disposition  Plan for SNF placement per wifes preference. TOC assisting with placement; PMT to remain involved for ongoing symptom management and goals-of-care  discussions.   Goals of Care and Additional Recommendations: Limitations on Scope of Treatment: Full Scope Treatment  Code Status:    Code Status Orders  (From admission, onward)           Start     Ordered   08/19/24 2300  Full code  Continuous       Question:  By:  Answer:  Consent: discussion documented in EHR   08/19/24 2300           Code Status History     Date Active Date Inactive Code Status Order ID Comments User Context   09/11/2021 1015 09/11/2021 1911 Full Code [id number]  Wendel Lurena POUR, MD Inpatient   12/07/2017 1752 12/11/2017 1906 Full Code [id number]  Elsner,  Victory, MD Inpatient   02/20/2016 2324 02/24/2016 1723 Full Code [id number]  Starleen Meter, MD ED   01/25/2014 1516 01/27/2014 1355 Full Code [id number]  Eletha Boas, MD Inpatient   01/21/2014 1425 01/25/2014 1516 Full Code [id number]  Madelyne Owen LABOR, MD Inpatient   01/05/2014 1835 01/11/2014 1751 Full Code [id number]  Madelyne Owen LABOR, MD Inpatient   05/10/2012 2125 05/12/2012 1531 Full Code [id number]  Gatha Arnette BRAVO, RN Inpatient       Prognosis:  Unable to determine  Discharge Planning: Skilled Nursing Facility for rehab with Palliative care service follow-up  Care plan was discussed with  IDT  Thank you for allowing the Palliative Medicine Team to assist in the care of this patient.  I personally spent a total of 35 minutes in the care of the patient today including preparing to see the patient, getting/reviewing separately obtained history, performing a medically appropriate exam/evaluation, counseling and educating, referring and communicating with other health care professionals, and documenting clinical information in the EHR.     Greater than 50%  of this time was spent counseling and coordinating care related to the above assessment and plan.  Lonia Serve, MD  Please contact Palliative Medicine Team phone at [phone] for questions and concerns.       "

## 2024-08-31 NOTE — Progress Notes (Signed)
 Speech Language Pathology  Patient Details Name: Richard Sandoval MRN: [id number] DOB: 17-Oct-1945 Today's Date: 08/31/2024 Time:  -      Pt was functional with diet and SLP signed off for swallow. He has cognitive impairments however he isn't likely to progress much in acute care given wife's report of decline in cog over the past at least month per evaluating LP. Will be discharged to SNF, Palliative following and ST will sign off and defer cognitive tx to SNF as appropriate.   Dustin Olam Bull  08/31/2024, 8:45 AM

## 2024-08-31 NOTE — Progress Notes (Signed)
" °   08/31/24 2231  BiPAP/CPAP/SIPAP  BiPAP/CPAP/SIPAP Pt Type Adult  BiPAP/CPAP/SIPAP Resmed  Mask Type Full face mask  Dentures removed? Not applicable  Mask Size Large  Respiratory Rate 18 breaths/min  PEEP 10 cmH20  FiO2 (%) 21 %  Patient Home Machine No  Patient Home Mask No  Patient Home Tubing No  Auto Titrate No  CPAP/SIPAP surface wiped down Yes  Device Plugged into RED Power Outlet Yes    "

## 2024-09-01 LAB — CBC WITH DIFFERENTIAL/PLATELET
Abs Immature Granulocytes: 0.05 10*3/uL (ref 0.00–0.07)
Basophils Absolute: 0.1 10*3/uL (ref 0.0–0.1)
Basophils Relative: 1 %
Eosinophils Absolute: 0.2 10*3/uL (ref 0.0–0.5)
Eosinophils Relative: 3 %
HCT: 43.1 % (ref 39.0–52.0)
Hemoglobin: 13.8 g/dL (ref 13.0–17.0)
Immature Granulocytes: 1 %
Lymphocytes Relative: 34 %
Lymphs Abs: 1.7 10*3/uL (ref 0.7–4.0)
MCH: 29.4 pg (ref 26.0–34.0)
MCHC: 32 g/dL (ref 30.0–36.0)
MCV: 91.9 fL (ref 80.0–100.0)
Monocytes Absolute: 0.6 10*3/uL (ref 0.1–1.0)
Monocytes Relative: 11 %
Neutro Abs: 2.5 10*3/uL (ref 1.7–7.7)
Neutrophils Relative %: 50 %
Platelets: 256 10*3/uL (ref 150–400)
RBC: 4.69 MIL/uL (ref 4.22–5.81)
RDW: 14.2 % (ref 11.5–15.5)
WBC: 5.1 10*3/uL (ref 4.0–10.5)
nRBC: 0 % (ref 0.0–0.2)

## 2024-09-01 LAB — COMPREHENSIVE METABOLIC PANEL WITH GFR
ALT: 9 U/L (ref 0–44)
AST: 34 U/L (ref 15–41)
Albumin: 3.5 g/dL (ref 3.5–5.0)
Alkaline Phosphatase: 75 U/L (ref 38–126)
Anion gap: 11 (ref 5–15)
BUN: 31 mg/dL — ABNORMAL HIGH (ref 8–23)
CO2: 25 mmol/L (ref 22–32)
Calcium: 9.3 mg/dL (ref 8.9–10.3)
Chloride: 107 mmol/L (ref 98–111)
Creatinine, Ser: 1.08 mg/dL (ref 0.61–1.24)
GFR, Estimated: >60 mL/min
Glucose, Bld: 98 mg/dL (ref 70–99)
Potassium: 3.9 mmol/L (ref 3.5–5.1)
Sodium: 143 mmol/L (ref 135–145)
Total Bilirubin: 1.9 mg/dL — ABNORMAL HIGH (ref 0.0–1.2)
Total Protein: 6.8 g/dL (ref 6.5–8.1)

## 2024-09-01 LAB — PHOSPHORUS: Phosphorus: 3 mg/dL (ref 2.5–4.6)

## 2024-09-01 LAB — MAGNESIUM: Magnesium: 2.5 mg/dL — ABNORMAL HIGH (ref 1.7–2.4)

## 2024-09-01 NOTE — Progress Notes (Signed)
 Physical Therapy Treatment Patient Details Name: Taelyn Nemes MRN: [id number] DOB: [birth year] Today's Date: 09/01/2024   History of Present Illness Mr. Musial is a 79 yr old male admitted to the hospital 08-19-24 with weakness, confusion, and cough. He was found to have severe sepsis, multi-focal PNA, acute encephalopathy, hypothermia, and moderate protein calorie malnutrition. PMH: Parkinson's disease, chronic heart failure, sleep apnea, gallstone pancreatitis, PE/DVT, CAD, HTN, obesity, pulmonary HTN, back surgery    PT Comments  The  patient is alert, speaks more clearly.  Patient required total assist for bed mobility.  Once sitting on bed  edge, Stedy lift placed. Patient able to pull to stand and stood several seconds up to a minute, and also  performed  small step in place, knees did not buckle. Patient assisted back into bed with +2 total assistance .    BP 113/74.  Patient will benefit from continued inpatient follow up therapy, <3 hours/day.   If plan is discharge home, recommend the following: Two people to help with walking and/or transfers;Two people to help with bathing/dressing/bathroom;Assist for transportation;Help with stairs or ramp for entrance   Can travel by private vehicle        Equipment Recommendations  None recommended by PT    Recommendations for Other Services       Precautions / Restrictions Precautions Precautions: Fall Restrictions Weight Bearing Restrictions Per Provider Order: No     Mobility  Bed Mobility Overal bed mobility: Needs Assistance Bed Mobility: Rolling, Supine to Sit Rolling: +2 for physical assistance, +2 for safety/equipment, Used rails, Max assist   Supine to sit: Total assist, +2 for physical assistance, +2 for safety/equipment, Used rails, HOB elevated     General bed mobility comments: patient moved each leg a few inches but required total assist to move to sitting woth 2 persons assisting. then total assist with trunk and  legs back into bed.    Transfers Overall transfer level: Needs assistance                 General transfer comment: patient assisted to stand, first time mod assist , then patient stod 4 more times with mion assist pulling up onto the  cross bar. patient stood anywhere from ~ 70' to 1 . patient did shit=ft weight from leg to leg when standing in stedy Transfer via Lift Equipment: Stedy  Ambulation/Gait                   Stairs             Wheelchair Mobility     Tilt Bed    Modified Rankin (Stroke Patients Only)       Balance Overall balance assessment: Needs assistance Sitting-balance support: Bilateral upper extremity supported, Feet supported Sitting balance-Leahy Scale: Poor Sitting balance - Comments: right lean initially progressed to   sitting upright when  seated on stedy seat   Standing balance support: Bilateral upper extremity supported, During functional activity, Reliant on assistive device for balance Standing balance-Leahy Scale: Poor Standing balance comment: relaint on UE support                            Communication Communication Communication: Impaired Factors Affecting Communication: Reduced clarity of speech  Cognition Arousal: Alert Behavior During Therapy: Flat affect, WFL for tasks assessed/performed   PT - Cognitive impairments: History of cognitive impairments, Orientation, Awareness, Memory  PT - Cognition Comments: speech is clearer although limited   Following commands impaired: Follows one step commands inconsistently, Follows one step commands with increased time    Cueing Cueing Techniques: Verbal cues, Gestural cues, Tactile cues  Exercises      General Comments        Pertinent Vitals/Pain Pain Assessment Pain Assessment: No/denies pain    Home Living                          Prior Function            PT Goals (current goals can now be  found in the care plan section) Progress towards PT goals: Progressing toward goals    Frequency    Min 2X/week      PT Plan      Co-evaluation PT/OT/SLP Co-Evaluation/Treatment: Yes Reason for Co-Treatment: For patient/therapist safety;To address functional/ADL transfers PT goals addressed during session: Mobility/safety with mobility;Balance OT goals addressed during session: ADL's and self-care;Proper use of Adaptive equipment and DME      AM-PAC PT 6 Clicks Mobility   Outcome Measure  Help needed turning from your back to your side while in a flat bed without using bedrails?: A Lot Help needed moving from lying on your back to sitting on the side of a flat bed without using bedrails?: Total Help needed moving to and from a bed to a chair (including a wheelchair)?: Total Help needed standing up from a chair using your arms (e.g., wheelchair or bedside chair)?: Total Help needed to walk in hospital room?: Total Help needed climbing 3-5 steps with a railing? : Total 6 Click Score: 7    End of Session   Activity Tolerance: Patient tolerated treatment well Patient left: in bed;with call bell/phone within reach;with bed alarm set;with family/visitor present Nurse Communication: Mobility status;Need for lift equipment PT Visit Diagnosis: Unsteadiness on feet (R26.81);Muscle weakness (generalized) (M62.81);Difficulty in walking, not elsewhere classified (R26.2)     Time: 8955-8886 PT Time Calculation (min) (ACUTE ONLY): 29 min  Charges:    $Therapeutic Activity: 8-22 mins PT General Charges $$ ACUTE PT VISIT: 1 Visit                     Darice Potters PT Acute Rehabilitation Services Office [phone]    Potters Darice Norris 09/01/2024, 1:05 PM

## 2024-09-01 NOTE — Plan of Care (Signed)

## 2024-09-01 NOTE — Plan of Care (Signed)
  Problem: Clinical Measurements: Goal: Will remain free from infection Outcome: Progressing Goal: Diagnostic test results will improve Outcome: Progressing Goal: Cardiovascular complication will be avoided Outcome: Progressing   Problem: Coping: Goal: Level of anxiety will decrease Outcome: Progressing

## 2024-09-01 NOTE — Progress Notes (Signed)
" °   09/01/24 2248  BiPAP/CPAP/SIPAP  BiPAP/CPAP/SIPAP Pt Type Adult  BiPAP/CPAP/SIPAP Resmed  Mask Type Full face mask  Dentures removed? Not applicable  Mask Size Large  Respiratory Rate 16 breaths/min  PEEP 10 cmH20  FiO2 (%) 21 %  Patient Home Machine No  Patient Home Mask No  Patient Home Tubing No  Auto Titrate No  CPAP/SIPAP surface wiped down Yes  Device Plugged into RED Power Outlet Yes    "

## 2024-09-01 NOTE — Progress Notes (Signed)
 Occupational Therapy Treatment Patient Details Name: Abdoul Encinas MRN: [id number] DOB: [birth year] Today's Date: 09/01/2024   History of present illness Mr. Spraggins is a 79 yr old male admitted to the hospital 08-19-24 with weakness, confusion, and cough. He was found to have severe sepsis, multi-focal PNA, acute encephalopathy, hypothermia, and moderate protein calorie malnutrition. PMH: Parkinson's disease, chronic heart failure, sleep apnea, gallstone pancreatitis, PE/DVT, CAD, HTN, obesity, pulmonary HTN, back surgery   OT comments  The pt was seen for ADL instruction, functional strengthening and functional transfers. He required total assist to donn his socks and max assist to donn a hospital gown in bed. He then needed total assist x2 to perform supine to sit. He required assist and cues to correct posterior leaning seated EOB. He was able to stand multiple times with min-mod assist x2 using the [facility] device. His overall standing duration was 15 seconds to 1 minute after each stand. Once back in bed, he required min assist for face washing and to drink from a cup. He presented with good effort and participation. He was noted to be with some continued disorientation and confusion, as he reported the month to be November and he thought he was in [place]. He also required occasional assist for sequencing and problem solving. Continue OT plan of care. Patient will benefit from continued inpatient follow up therapy, <3 hours/day.       If plan is discharge home, recommend the following:  Two people to help with walking and/or transfers;Two people to help with bathing/dressing/bathroom;Assistance with cooking/housework;Assistance with feeding;Direct supervision/assist for medications management;Direct supervision/assist for financial management;Assist for transportation;Help with stairs or ramp for entrance;Supervision due to cognitive status   Equipment Recommendations  Other (comment) (defer to  next setting)    Recommendations for Other Services      Precautions / Restrictions Precautions Precautions: Fall Restrictions Weight Bearing Restrictions Per Provider Order: No       Mobility Bed Mobility Overal bed mobility: Needs Assistance Bed Mobility: Supine to Sit, Sit to Supine     Supine to sit: Total assist, +2 for physical assistance, +2 for safety/equipment, HOB elevated Sit to supine: Total assist, +2 for physical assistance   General bed mobility comments: required assist for BLE and trunk, in order to perform supine to sit    Transfers Overall transfer level: Needs assistance   Transfers: Sit to/from Stand             General transfer comment: Patient assisted to stand, first time mod assist, then patient stood 4 more times with min assist pulling up onto the cross bar of stedy.  Patient stood anywhere from 15 seconds to 1 minute after each stand; patient did shift weight from leg to leg when standing in stedy. He required intermittent cues to demo upright posture in standing Transfer via Lift Equipment: Stedy   Balance     Sitting balance-Leahy Scale: Poor       Standing balance-Leahy Scale: Poor              ADL either performed or assessed with clinical judgement   ADL Overall ADL's : Needs assistance/impaired Eating/Feeding: Minimal assistance;Bed level Eating/Feeding Details (indicate cue type and reason): The pt did not initiate self-feeding to verbal prompt. Once OT placed a cup in his hand then verbally prompted him to initiate tasks, he was able to drank from the cup a couple times. Grooming: Minimal assistance;Bed level;Cueing for sequencing Grooming Details (indicate cue type and reason): The pt required  assist to wring out a washcloth & OT had to place the washcloth in his R hand. Afterwards, he was verbally prompted to initiate face washing, which he did with SBA in bed.         Upper Body Dressing : Maximal assistance;Bed  level;Cueing for sequencing;Cueing for compensatory techniques Upper Body Dressing Details (indicate cue type and reason): Pt required max assist and increased verbal and tactile cues for sequencing, including to pull shirt up his arms, in order to donn a clean hospital gown at bed level. Lower Body Dressing: Total assistance;Bed level Lower Body Dressing Details (indicate cue type and reason): assist needed to donn socks in bed                              Communication Communication Communication: Impaired Factors Affecting Communication: Reduced clarity of speech   Cognition   Behavior During Therapy: Flat affect, WFL for tasks assessed/performed Cognition: Cognition impaired   Orientation impairments: Place, Time, Situation Awareness: Intellectual awareness impaired, Online awareness impaired Memory impairment (select all impairments): Declarative long-term memory, Working memory, Short-term memory Attention impairment (select first level of impairment): Divided attention, Selective attention Executive functioning impairment (select all impairments): Initiation, Sequencing, Problem solving, Organization                   Following commands: Impaired Following commands impaired: Follows one step commands inconsistently      Cueing   Cueing Techniques: Verbal cues, Gestural cues, Tactile cues             Pertinent Vitals/ Pain       Pain Assessment Pain Assessment: No/denies pain   Frequency  Min 2X/week        Progress Toward Goals  OT Goals(current goals can now be found in the care plan section)  Progress towards OT goals: Progressing toward goals  Acute Rehab OT Goals OT Goal Formulation: With patient Time For Goal Achievement: 09/08/24 Potential to Achieve Goals: Good  Plan      Co-evaluation      Reason for Co-Treatment: For patient/therapist safety;To address functional/ADL transfers PT goals addressed during session:  Mobility/safety with mobility;Balance OT goals addressed during session: ADL's and self-care;Proper use of Adaptive equipment and DME      AM-PAC OT 6 Clicks Daily Activity     Outcome Measure   Help from another person eating meals?: A Little Help from another person taking care of personal grooming?: A Lot Help from another person toileting, which includes using toliet, bedpan, or urinal?: Total Help from another person bathing (including washing, rinsing, drying)?: A Lot Help from another person to put on and taking off regular upper body clothing?: A Lot Help from another person to put on and taking off regular lower body clothing?: Total 6 Click Score: 11    End of Session Equipment Utilized During Treatment: Gait belt;Other (comment) Laurent)  OT Visit Diagnosis: Other abnormalities of gait and mobility (R26.89);Muscle weakness (generalized) (M62.81);Feeding difficulties (R63.3);Other symptoms and signs involving cognitive function;Unsteadiness on feet (R26.81)   Activity Tolerance Patient tolerated treatment well   Patient Left in bed;with call bell/phone within reach;with bed alarm set   Nurse Communication Mobility status        Time: 8953-8886 OT Time Calculation (min): 27 min  Charges: OT General Charges $OT Visit: 1 Visit OT Treatments $Self Care/Home Management : 8-22 mins    Delanna JINNY Lesches, OTR/L 09/01/2024, 1:04 PM

## 2024-09-01 NOTE — Progress Notes (Signed)
 "                                                                                                                                                                                                          Daily Progress Note   Patient Name: Richard Sandoval       Date: 09/01/2024 DOB: 1945-11-17  Age: 79 y.o. MRN#: [id number] Attending Physician: Perri DELENA Meliton Mickey., * Primary Care Physician: [name], MD Admit Date: 08/19/2024  Reason for Consultation/Follow-up: Establishing goals of care agitation/encephalopathy management, feeding decisions, disposition planning   Subjective:  Awake alert, resting in bed, no acute distress, not agitated at present.   Length of Stay: 13  Current Medications: Scheduled Meds:   carbidopa -levodopa   1 tablet Oral 3 times per day   Chlorhexidine  Gluconate Cloth  6 each Topical Daily   feeding supplement  237 mL Oral BID BM   melatonin  3 mg Oral QHS   mouth rinse  15 mL Mouth Rinse 4 times per day   QUEtiapine   12.5 mg Oral QHS   rivaroxaban   20 mg Oral Q supper   tamsulosin   0.4 mg Oral Daily    Continuous Infusions:   PRN Meds: acetaminophen  **OR** acetaminophen , haloperidol  lactate, sorbitol   Physical Exam         Awake alert No distress Not agitated Regular work of breathing Appears with generalized deconditioning.  No edema S 1 S 2  Vital Signs: BP 117/61 (BP Location: Left Arm)   Pulse 72   Temp 98.4 F (36.9 C)   Resp 19   Wt 120 kg   SpO2 96%   BMI 37.96 kg/m  SpO2: SpO2: 96 % O2 Device: O2 Device: Room Air O2 Flow Rate:    Intake/output summary:  Intake/Output Summary (Last 24 hours) at 09/01/2024 1057 Last data filed at 09/01/2024 0500 Gross per 24 hour  Intake 170 ml  Output 650 ml  Net -480 ml   LBM: Last BM Date : 08/28/24 Baseline Weight: Weight: 134.3 kg Most recent weight: Weight: 120 kg       Palliative Assessment/Data:      Patient Active Problem List   Diagnosis Date Noted   Altered mental  status 08/24/2024   Multifocal pneumonia 08/20/2024   Sepsis (HCC) 08/19/2024   Parkinson's disease (HCC) 08/19/2024   Hypothermia 08/19/2024   Acute metabolic encephalopathy 08/19/2024   History of pulmonary embolism 08/19/2024   Thrombocytopenia 08/19/2024   Chronic combined systolic and diastolic heart failure (HCC) 09/06/2021   CAD in native artery 09/06/2021  History of colonic polyps 03/04/2019   Lumbar stenosis with neurogenic claudication 12/07/2017   Right heart failure (HCC) 08/27/2017   Pulmonary hypertension (HCC) 06/04/2017   GERD (gastroesophageal reflux disease) 02/21/2016   Bilateral pulmonary embolism (HCC) 02/20/2016   Hypokalemia 01/25/2014   Pancreatitis, gallstone 01/25/2014   Morbid obesity (HCC)    Pancreatitis 01/05/2014   Abnormal transaminases 01/05/2014   Dyspnea 12/15/2013   Upper airway cough syndrome 09/27/2013   Cellulitis and abscess 05/10/2012   Morbid (severe) obesity due to excess calories (HCC) 09/24/2006   DEPRESSIVE DISORDER, NOS 09/24/2006   BACK PAIN, LOW 09/24/2006   OSA (obstructive sleep apnea) 09/24/2006    Palliative Care Assessment & Plan   Patient Profile: Assessment  79 year old male with Parkinson disease, prior PE/DVT, chronic HFrEF, OSA, and recent influenza pneumonia, now with multifactorial encephalopathy superimposed on probable Parkinson disease-related dementia, manifested by agitation, functional decline, and inability to maintain nutrition independently. Course complicated by hypothermia, persistent cognitive impairment despite treatment of presumed infection, EEG with generalized cerebral dysfunction, and progressive decline reported by family predating this admission. Overall prognosis is guarded with high risk for continued cognitive and functional deterioration.  Palliative Care consulted for symptom management, goals of care clarification, and support with complex decision-making, including antipsychotic use and  feeding considerations.  Plan 1. Goals of Care / Decision-Making  Patient lacks medical decision-making capacity. Surrogate decision-maker: wife. Current goals emphasize continued full scope care, with preference for skilled nursing facility placement rather than return home. TOC working with wife to facilitate a safe disposition.   PMT will continue to follow to clarify expectations regarding trajectory, feeding tube considerations, and long-term goals.  2. Agitation / Encephalopathy    olanzapine  (Zyprexa ) has been d/c. Dopamine D2 receptor blockade may worsen parkinsonism and motor symptoms; generally not recommended in Parkinson disease with dementia.  Remains on quetiapine  (Seroquel ): Lower D2 affinity; better tolerated in Parkinson disease when antipsychotic therapy is necessary for agitation or psychosis.    Haloperidol  PRN for severe agitation threatening patient or staff safety only; use sparingly given risk of extrapyramidal symptoms and worsening parkinsonism. Continue nonpharmacologic measures: reorientation, sleep-wake cycle support, minimizing restraints and deliriogenic medications.  3. Nutrition / Feeding  NG tube removed on 2/2. Nutrition recommends Cortrak reinsertion due to poor oral intake and inability to self-feed.  Wife prefers continued careful hand-assisted feeding at this time. Reviewed risks and benefits of feeding tube reinsertion with wife, including aspiration risk, discomfort, and limited impact on cognitive recovery. Support comfort-focused oral intake, meticulous oral care, and reassess feeding decisions as goals evolve.  4. Prognosis  Not immediately terminal, but prognosis remains guarded given underlying neurodegenerative disease, encephalopathy, and functional dependence. High likelihood of continued decline and recurrent hospitalizations despite current interventions.   5. Disposition  Plan for SNF placement per wifes preference. TOC  assisting with placement; PMT to remain involved for ongoing symptom management and goals-of-care discussions.   Goals of Care and Additional Recommendations: Limitations on Scope of Treatment: Full Scope Treatment  Code Status:    Code Status Orders  (From admission, onward)           Start     Ordered   08/19/24 2300  Full code  Continuous       Question:  By:  Answer:  Consent: discussion documented in EHR   08/19/24 2300           Code Status History     Date Active Date Inactive Code Status Order ID Comments  User Context   09/11/2021 1015 09/11/2021 1911 Full Code [id number]  Wendel Lurena POUR, MD Inpatient   12/07/2017 1752 12/11/2017 1906 Full Code [id number]  Colon Shove, MD Inpatient   02/20/2016 2324 02/24/2016 1723 Full Code [id number]  Starleen Meter, MD ED   01/25/2014 1516 01/27/2014 1355 Full Code [id number]  Eletha Boas, MD Inpatient   01/21/2014 1425 01/25/2014 1516 Full Code [id number]  Madelyne Owen LABOR, MD Inpatient   01/05/2014 1835 01/11/2014 1751 Full Code [id number]  Madelyne Owen LABOR, MD Inpatient   05/10/2012 2125 05/12/2012 1531 Full Code [id number]  Gatha Arnette BRAVO, RN Inpatient       Prognosis:  Unable to determine  Discharge Planning: Skilled Nursing Facility for rehab with Palliative care service follow-up  Care plan was discussed with  IDT  Thank you for allowing the Palliative Medicine Team to assist in the care of this patient.  I personally spent a total of 25 minutes in the care of the patient today including preparing to see the patient, getting/reviewing separately obtained history, performing a medically appropriate exam/evaluation, counseling and educating, referring and communicating with other health care professionals, and documenting clinical information in the EHR.     Greater than 50%  of this time was spent counseling and coordinating care related to the above assessment and plan.  Lonia Serve, MD  Please contact Palliative Medicine  Team phone at [phone] for questions and concerns.       "

## 2024-09-01 NOTE — Progress Notes (Signed)
 " PROGRESS NOTE    Richard Sandoval  FMW:[id number] DOB: 12/27/1945 DOA: 08/19/2024 PCP: [name], MD  Chief Complaint  Patient presents with   Altered Mental Status    Brief Narrative:   This is Richard Sandoval 79 year old male with Parkinson disease, history of PE/DVT, chronic HFrEF, gallstone pancreatitis, sleep apnea brought to the hospital on 1/23 for forgetfulness, increased trouble with walking, generalized weakness progressing over 2 weeks.  His wife noted poor oral intake over the past several weeks as well.  The patient had influenza pneumonia about 3 weeks prior and has been treated with antibiotics.   In ED: Temperature noted to be 90 degrees rectally. CT scan of the chest revealed multiple focus of ground glass infiltrates throughout both lungs with bronchial wall thickening consistent with bronchitis, fatty infiltration of the pancreas, moderate bilateral pleural effusions CT head and MRI were unrevealing   He was started on empiric antibiotics for pneumonia but remained encephalopathic.    1/25: Cefepime  changed to ceftriaxone  as the patient's encephalopathy was ongoing 1/26: Core track placed and tube feeds started as he remained encephalopathic and unable to eat 1/28 and neurology consult was requested for persistent encephalopathy.  This revealed that he had at least 2 months of cognitive decline prior to coming to the hospital.   EEG revealed generalized cerebral dysfunction/encephalopathy 2/3 becoming agitated- antipsychotics started after speaking with family- wife would prefer he go to SNF as opposed to returning home    Assessment & Plan:   Principal Problem:   Multifocal pneumonia Active Problems:   OSA (obstructive sleep apnea)   Pancreatitis   Chronic combined systolic and diastolic heart failure (HCC)   CAD in native artery   Sepsis (HCC)   Parkinson's disease (HCC)   Hypothermia   Acute metabolic encephalopathy   History of pulmonary embolism    Thrombocytopenia   Altered mental status  Goals of care Appreciate palliative assistance  Multifocal pneumonia with sepsis and hypothermia - CT chest with multifocal pneumonia vs aspiration with associated bronchial wall thickening c/w bronchitis - Completed 7 days of antibiotics   Acute metabolic encephalopathy Dementia  - seen by neurology earlier during this course, thought to have multifactorial encephalopathy related t infection, fevere, cefepime , baseline cognitive impairment.  Recommending sinemet  and outpatient neurology follow up with Dr. [name]  Neurology signed off on 1/30.  - MRI 1/30 without acute or recent infarction, age commensurate cerebral volume loss - delirium precautions - appreciate palliative recs for agitation/encephalopathy   Nutrition NG tube removed Follow PO intake, seen eating breakfast this AM   Atrial flutter - Noted on telemetry during this hospital stay   - Rate controlled - Continue Xarelto  which he takes for h/o PE/DVT     Fever  - noted earlier in hospitalization  - will continue to monitor - fever curve appears to have improved on abx   Chronic combined systolic and diastolic heart failure - Noted to have bilateral pleural effusions on imaging when he was admitted - Last 2D echo from 06/27/2024 revealed an EF of 55 to 60% with grade 2 diastolic dysfunction - lasix  is currently on hold, will evaluate for resumption (per med rec, doesn't appear this was filled)   Pancreatitis Unclear clinical significance, CT at presentation with possible early acute pancreatitis Lipase not consistent with this  CAD in native artery - Cardiac cath from 2023 revealed mild nonobstructive stenosis    Parkinson's disease - Continue Sinemet  - PT eval shows he is Keanan Melander total assist -  family is opting for SNF   History of PE/DVT - Can continue Xarelto    Acute thrombocytopenia - platelets low when admitted - will continue repeat labs intermittently - improving as  of 1/31  OSA (obstructive sleep apnea) - Continue to attempt CPAP at night     DVT prophylaxis: xarelto  Code Status: full Family Communication: none Disposition:   Status is: Inpatient Remains inpatient appropriate because: need for continued inpt care, safe d/c plan   Consultants:  Palliative care  Procedures:  none  Antimicrobials:  Anti-infectives (From admission, onward)    Start     Dose/Rate Route Frequency Ordered Stop   08/24/24 2000  azithromycin  (ZITHROMAX ) tablet 500 mg        500 mg Per Tube Every 24 hours 08/24/24 1137 08/25/24 2035   08/21/24 2000  doxycycline  (VIBRA -TABS) tablet 100 mg  Status:  Discontinued        100 mg Oral Every 12 hours 08/21/24 0916 08/21/24 1427   08/21/24 2000  doxycycline  (VIBRAMYCIN ) 100 mg in sodium chloride  0.9 % 250 mL IVPB  Status:  Discontinued        100 mg 125 mL/hr over 120 Minutes Intravenous Every 12 hours 08/21/24 1427 08/24/24 1137   08/21/24 1000  cefTRIAXone  (ROCEPHIN ) 2 g in sodium chloride  0.9 % 100 mL IVPB        2 g 200 mL/hr over 30 Minutes Intravenous Daily 08/21/24 0935 08/25/24 1058   08/20/24 1200  vancomycin  (VANCOCIN ) 2,250 mg in sodium chloride  0.9 % 500 mL IVPB  Status:  Discontinued        2,250 mg 261.3 mL/hr over 120 Minutes Intravenous Every 24 hours 08/19/24 2343 08/20/24 1031   08/20/24 0800  doxycycline  (VIBRAMYCIN ) 100 mg in sodium chloride  0.9 % 250 mL IVPB  Status:  Discontinued        100 mg 125 mL/hr over 120 Minutes Intravenous Every 12 hours 08/19/24 2306 08/21/24 0916   08/20/24 0400  ceFEPIme  (MAXIPIME ) 2 g in sodium chloride  0.9 % 100 mL IVPB  Status:  Discontinued        2 g 200 mL/hr over 30 Minutes Intravenous Every 8 hours 08/19/24 2321 08/21/24 0935   08/19/24 1915  aztreonam (AZACTAM) 2 g in sodium chloride  0.9 % 100 mL IVPB  Status:  Discontinued        2 g 200 mL/hr over 30 Minutes Intravenous  Once 08/19/24 1900 08/19/24 1904   08/19/24 1915  metroNIDAZOLE  (FLAGYL ) IVPB 500  mg        500 mg 100 mL/hr over 60 Minutes Intravenous  Once 08/19/24 1900 08/19/24 2039   08/19/24 1915  vancomycin  (VANCOCIN ) IVPB 1000 mg/200 mL premix        1,000 mg 200 mL/hr over 60 Minutes Intravenous  Once 08/19/24 1900 08/19/24 2222   08/19/24 1915  ceFEPIme  (MAXIPIME ) 2 g in sodium chloride  0.9 % 100 mL IVPB        2 g 200 mL/hr over 30 Minutes Intravenous  Once 08/19/24 1905 08/19/24 1954       Subjective:  No complaints this morning.   Objective: Vitals:   08/31/24 0511 08/31/24 2029 09/01/24 0540 09/01/24 0635  BP: (!) 148/45 111/65 117/61   Pulse: 82 72 72   Resp: 17 18 19    Temp: 99.4 F (37.4 C) 98.4 F (36.9 C)    TempSrc: Oral     SpO2:  97% 96%   Weight:    120 kg  Intake/Output Summary (Last 24 hours) at 09/01/2024 1251 Last data filed at 09/01/2024 1100 Gross per 24 hour  Intake 410 ml  Output 650 ml  Net -240 ml   Filed Weights   08/30/24 0405 08/31/24 0500 09/01/24 0635  Weight: 131.6 kg 124.7 kg 120 kg    Examination:  General: No acute distress. Eating breakfast. Cardiovascular: RRR Lungs: unlabored Abdomen: Soft, nontender, nondistended Neurological: pleasantly confused. Moves all extremities 4 with equal strength. Cranial nerves II through XII grossly intact. Extremities: No clubbing or cyanosis. No edema.   Data Reviewed: I have personally reviewed following labs and imaging studies  CBC: Recent Labs  Lab 08/26/24 0709 08/27/24 0652 09/01/24 0601  WBC 4.8 6.1 5.1  NEUTROABS  --   --  2.5  HGB 13.3 13.3 13.8  HCT 42.0 42.7 43.1  MCV 92.3 93.8 91.9  PLT 104* 128* 256    Basic Metabolic Panel: Recent Labs  Lab 08/26/24 0709 08/27/24 0652 08/29/24 0505 09/01/24 0601  NA 142 140 139 143  K 4.2 4.5 3.9 3.9  CL 105 104 102 107  CO2 28 28 26 25   GLUCOSE 116* 100* 94 98  BUN 23 22 27* 31*  CREATININE 0.94 0.90 1.03 1.08  CALCIUM  8.9 9.2 9.3 9.3  MG 2.2  --   --  2.5*  PHOS 3.1  --   --  3.0    GFR: Estimated  Creatinine Clearance: 74.4 mL/min (by C-G formula based on SCr of 1.08 mg/dL).  Liver Function Tests: Recent Labs  Lab 08/26/24 0709 08/27/24 0652 08/29/24 0505 09/01/24 0601  AST 33 36 32 34  ALT 18 17 11 9   ALKPHOS 76 76 76 75  BILITOT 1.3* 1.6* 1.7* 1.9*  PROT 6.7 6.9 6.8 6.8  ALBUMIN  3.2* 3.3* 3.4* 3.5    CBG: Recent Labs  Lab 08/29/24 2335 08/30/24 0345 08/30/24 0746 08/30/24 1121 08/30/24 1615  GLUCAP 98 101* 96 106* 108*     Recent Results (from the past 240 hours)  Culture, blood (Routine X 2) w Reflex to ID Panel     Status: None   Collection Time: 08/23/24 10:40 AM   Specimen: BLOOD RIGHT HAND  Result Value Ref Range Status   Specimen Description   Final    BLOOD RIGHT HAND Performed at [facility], [address], [place], [place] [address]    Special Requests   Final    BOTTLES DRAWN AEROBIC ONLY Blood Culture results may not be optimal due to an inadequate volume of blood received in culture bottles Performed at [facility], [address], [place], [place] [address]    Culture   Final    NO GROWTH 5 DAYS Performed at [facility], [address] [place], [place] [address]    Report Status 08/28/2024 FINAL  Final  Culture, blood (Routine X 2) w Reflex to ID Panel     Status: None   Collection Time: 08/23/24 10:43 AM   Specimen: BLOOD LEFT HAND  Result Value Ref Range Status   Specimen Description   Final    BLOOD LEFT HAND Performed at [facility], [address], [place], [place] [address]    Special Requests   Final    BOTTLES DRAWN AEROBIC ONLY Blood Culture results may not be optimal due to an inadequate volume of blood received in culture bottles Performed at [facility], [address] [place], [place] [address]  Culture   Final    NO GROWTH 5 DAYS Performed at [facility], [address] [place], [place] [address]    Report Status 08/28/2024 FINAL  Final  Resp  panel by RT-PCR (RSV, Flu Paloma Grange&B, Covid) Anterior Nasal Swab     Status: None   Collection Time: 08/23/24  5:23 PM   Specimen: Anterior Nasal Swab  Result Value Ref Range Status   SARS Coronavirus 2 by RT PCR NEGATIVE NEGATIVE Final    Comment: (NOTE) SARS-CoV-2 target nucleic acids are NOT DETECTED.  The SARS-CoV-2 RNA is generally detectable in upper respiratory specimens during the acute phase of infection. The lowest concentration of SARS-CoV-2 viral copies this assay can detect is 138 copies/mL. Elice Crigger negative result does not preclude SARS-Cov-2 infection and should not be used as the sole basis for treatment or other patient management decisions. Joe Tanney negative result may occur with  improper specimen collection/handling, submission of specimen other than nasopharyngeal swab, presence of viral mutation(s) within the areas targeted by this assay, and inadequate number of viral copies(<138 copies/mL). Dorie Ohms negative result must be combined with clinical observations, patient history, and epidemiological information. The expected result is Negative.  Fact Sheet for Patients:  bloggercourse.com  Fact Sheet for Healthcare Providers:  seriousbroker.it  This test is no t yet approved or cleared by the [place]  FDA and  has been authorized for detection and/or diagnosis of SARS-CoV-2 by FDA under an Emergency Use Authorization (EUA). This EUA will remain  in effect (meaning this test can be used) for the duration of the COVID-19 declaration under Section 564(b)(1) of the Act, 21 U.S.C.section 360bbb-3(b)(1), unless the authorization is terminated  or revoked sooner.       Influenza Evangelia Whitaker by PCR NEGATIVE NEGATIVE Final   Influenza B by PCR NEGATIVE NEGATIVE Final    Comment: (NOTE) The Xpert Xpress SARS-CoV-2/FLU/RSV plus assay is intended as an aid in the diagnosis of influenza from Nasopharyngeal swab specimens and should not be used as Al Bracewell  sole basis for treatment. Nasal washings and aspirates are unacceptable for Xpert Xpress SARS-CoV-2/FLU/RSV testing.  Fact Sheet for Patients: bloggercourse.com  Fact Sheet for Healthcare Providers: seriousbroker.it  This test is not yet approved or cleared by the [place]  FDA and has been authorized for detection and/or diagnosis of SARS-CoV-2 by FDA under an Emergency Use Authorization (EUA). This EUA will remain in effect (meaning this test can be used) for the duration of the COVID-19 declaration under Section 564(b)(1) of the Act, 21 U.S.C. section 360bbb-3(b)(1), unless the authorization is terminated or revoked.     Resp Syncytial Virus by PCR NEGATIVE NEGATIVE Final    Comment: (NOTE) Fact Sheet for Patients: bloggercourse.com  Fact Sheet for Healthcare Providers: seriousbroker.it  This test is not yet approved or cleared by the [place]  FDA and has been authorized for detection and/or diagnosis of SARS-CoV-2 by FDA under an Emergency Use Authorization (EUA). This EUA will remain in effect (meaning this test can be used) for the duration of the COVID-19 declaration under Section 564(b)(1) of the Act, 21 U.S.C. section 360bbb-3(b)(1), unless the authorization is terminated or revoked.  Performed at [facility], [address] [place], [place] [address]          Radiology Studies: No results found.      Scheduled Meds:  carbidopa -levodopa   1 tablet Oral 3 times per day   Chlorhexidine  Gluconate Cloth  6 each Topical Daily   feeding supplement  237  mL Oral BID BM   melatonin  3 mg Oral QHS   mouth rinse  15 mL Mouth Rinse 4 times per day   QUEtiapine   12.5 mg Oral QHS   rivaroxaban   20 mg Oral Q supper   tamsulosin   0.4 mg Oral Daily   Continuous Infusions:   LOS: 13 days    Time spent: over 30 min     Meliton Monte, MD [facility]   To contact the attending provider between 7A-7P or the covering provider during after hours 7P-7A, please log into the web site [email or web address] and access using universal [place] password for that web site. If you do not have the password, please call the hospital operator.  09/01/2024, 12:51 PM    "

## 2024-09-01 NOTE — Progress Notes (Signed)
 Noted increasing confusion throughout the day.  Attempted to get OOB without assistance, multiple times found towards foot of bed.  Screaming aloud please help me, they are leaving me, help me to get into the car  Removed clothing multiple times, removed Tele monitoring device and pulling at foley catheter.  PRN administered per MD orders, will follow closely, increase effective rounding.  Safety measures remain in place, call bell in reach.

## 2024-09-02 LAB — CBC WITH DIFFERENTIAL/PLATELET
Abs Immature Granulocytes: 0.02 10*3/uL (ref 0.00–0.07)
Basophils Absolute: 0.1 10*3/uL (ref 0.0–0.1)
Basophils Relative: 2 %
Eosinophils Absolute: 0.2 10*3/uL (ref 0.0–0.5)
Eosinophils Relative: 4 %
HCT: 44.3 % (ref 39.0–52.0)
Hemoglobin: 14.1 g/dL (ref 13.0–17.0)
Immature Granulocytes: 0 %
Lymphocytes Relative: 32 %
Lymphs Abs: 1.5 10*3/uL (ref 0.7–4.0)
MCH: 29.6 pg (ref 26.0–34.0)
MCHC: 31.8 g/dL (ref 30.0–36.0)
MCV: 92.9 fL (ref 80.0–100.0)
Monocytes Absolute: 0.4 10*3/uL (ref 0.1–1.0)
Monocytes Relative: 9 %
Neutro Abs: 2.6 10*3/uL (ref 1.7–7.7)
Neutrophils Relative %: 53 %
Platelets: 267 10*3/uL (ref 150–400)
RBC: 4.77 MIL/uL (ref 4.22–5.81)
RDW: 14.1 % (ref 11.5–15.5)
WBC: 4.8 10*3/uL (ref 4.0–10.5)
nRBC: 0 % (ref 0.0–0.2)

## 2024-09-02 LAB — COMPREHENSIVE METABOLIC PANEL WITH GFR
ALT: 14 U/L (ref 0–44)
AST: 35 U/L (ref 15–41)
Albumin: 3.4 g/dL — ABNORMAL LOW (ref 3.5–5.0)
Alkaline Phosphatase: 74 U/L (ref 38–126)
Anion gap: 10 (ref 5–15)
BUN: 31 mg/dL — ABNORMAL HIGH (ref 8–23)
CO2: 25 mmol/L (ref 22–32)
Calcium: 9.2 mg/dL (ref 8.9–10.3)
Chloride: 107 mmol/L (ref 98–111)
Creatinine, Ser: 1.05 mg/dL (ref 0.61–1.24)
GFR, Estimated: >60 mL/min
Glucose, Bld: 109 mg/dL — ABNORMAL HIGH (ref 70–99)
Potassium: 3.8 mmol/L (ref 3.5–5.1)
Sodium: 142 mmol/L (ref 135–145)
Total Bilirubin: 1.6 mg/dL — ABNORMAL HIGH (ref 0.0–1.2)
Total Protein: 7.1 g/dL (ref 6.5–8.1)

## 2024-09-02 LAB — PHOSPHORUS: Phosphorus: 3 mg/dL (ref 2.5–4.6)

## 2024-09-02 LAB — MAGNESIUM: Magnesium: 2.8 mg/dL — ABNORMAL HIGH (ref 1.7–2.4)

## 2024-09-02 NOTE — Progress Notes (Signed)
 " PROGRESS NOTE    Richard Sandoval  FMW:[id number] DOB: Aug 27, 1945 DOA: 08/19/2024 PCP: [name], MD  Chief Complaint  Patient presents with   Altered Mental Status    Brief Narrative:   This is Richard Sandoval 79 year old male with Parkinson disease, history of PE/DVT, chronic HFrEF, gallstone pancreatitis, sleep apnea brought to the hospital on 1/23 for forgetfulness, increased trouble with walking, generalized weakness progressing over 2 weeks.  His wife noted poor oral intake over the past several weeks as well.  The patient had influenza pneumonia about 3 weeks prior and has been treated with antibiotics.   In ED: Temperature noted to be 90 degrees rectally. CT scan of the chest revealed multiple focus of ground glass infiltrates throughout both lungs with bronchial wall thickening consistent with bronchitis, fatty infiltration of the pancreas, moderate bilateral pleural effusions CT head and MRI were unrevealing   He was started on empiric antibiotics for pneumonia but remained encephalopathic.    1/25: Cefepime  changed to ceftriaxone  as the patient's encephalopathy was ongoing 1/26: Core track placed and tube feeds started as he remained encephalopathic and unable to eat 1/28 and neurology consult was requested for persistent encephalopathy.  This revealed that he had at least 2 months of cognitive decline prior to coming to the hospital.   EEG revealed generalized cerebral dysfunction/encephalopathy 2/3 becoming agitated- antipsychotics started after speaking with family- wife would prefer he go to SNF as opposed to returning home    Assessment & Plan:   Principal Problem:   Multifocal pneumonia Active Problems:   OSA (obstructive sleep apnea)   Pancreatitis   Chronic combined systolic and diastolic heart failure (HCC)   CAD in native artery   Sepsis (HCC)   Parkinson's disease (HCC)   Hypothermia   Acute metabolic encephalopathy   History of pulmonary embolism    Thrombocytopenia   Altered mental status  Goals of care Appreciate palliative assistance  Multifocal pneumonia with sepsis and hypothermia - CT chest with multifocal pneumonia vs aspiration with associated bronchial wall thickening c/w bronchitis - Completed 7 days of antibiotics   Acute metabolic encephalopathy Dementia  - seen by neurology earlier during this course, thought to have multifactorial encephalopathy related t infection, fevere, cefepime , baseline cognitive impairment.  Recommending sinemet  and outpatient neurology follow up with Dr. [name]  Neurology signed off on 1/30.  - MRI 1/30 without acute or recent infarction, age commensurate cerebral volume loss - delirium precautions - appreciate palliative recs for agitation/encephalopathy   Nutrition NG tube removed Follow PO intake, seen eating breakfast this AM   Atrial flutter - Noted on telemetry during this hospital stay   - Rate controlled - Continue Xarelto  which he takes for h/o PE/DVT     Fever  - noted earlier in hospitalization  - will continue to monitor - fever curve appears to have improved on abx   Chronic combined systolic and diastolic heart failure - Noted to have bilateral pleural effusions on imaging when he was admitted - Last 2D echo from 06/27/2024 revealed an EF of 55 to 60% with grade 2 diastolic dysfunction - lasix  is currently on hold, will evaluate for resumption (per med rec, doesn't appear this was filled)   Pancreatitis Unclear clinical significance, CT at presentation with possible early acute pancreatitis Lipase not consistent with this  CAD in native artery - Cardiac cath from 2023 revealed mild nonobstructive stenosis    Parkinson's disease - Continue Sinemet  - PT eval shows he is [name] total assist -  family is opting for SNF   History of PE/DVT - Can continue Xarelto    Acute thrombocytopenia - platelets low when admitted - will continue repeat labs intermittently - improving as  of 1/31  OSA (obstructive sleep apnea) - Continue to attempt CPAP at night  Discussing SNF plans with TOC      DVT prophylaxis: xarelto  Code Status: full Family Communication: none Disposition:   Status is: Inpatient Remains inpatient appropriate because: need for continued inpt care, safe d/c plan   Consultants:  Palliative care  Procedures:  none  Antimicrobials:  Anti-infectives (From admission, onward)    Start     Dose/Rate Route Frequency Ordered Stop   08/24/24 2000  azithromycin  (ZITHROMAX ) tablet 500 mg        500 mg Per Tube Every 24 hours 08/24/24 1137 08/25/24 2035   08/21/24 2000  doxycycline  (VIBRA -TABS) tablet 100 mg  Status:  Discontinued        100 mg Oral Every 12 hours 08/21/24 0916 08/21/24 1427   08/21/24 2000  doxycycline  (VIBRAMYCIN ) 100 mg in sodium chloride  0.9 % 250 mL IVPB  Status:  Discontinued        100 mg 125 mL/hr over 120 Minutes Intravenous Every 12 hours 08/21/24 1427 08/24/24 1137   08/21/24 1000  cefTRIAXone  (ROCEPHIN ) 2 g in sodium chloride  0.9 % 100 mL IVPB        2 g 200 mL/hr over 30 Minutes Intravenous Daily 08/21/24 0935 08/25/24 1058   08/20/24 1200  vancomycin  (VANCOCIN ) 2,250 mg in sodium chloride  0.9 % 500 mL IVPB  Status:  Discontinued        2,250 mg 261.3 mL/hr over 120 Minutes Intravenous Every 24 hours 08/19/24 2343 08/20/24 1031   08/20/24 0800  doxycycline  (VIBRAMYCIN ) 100 mg in sodium chloride  0.9 % 250 mL IVPB  Status:  Discontinued        100 mg 125 mL/hr over 120 Minutes Intravenous Every 12 hours 08/19/24 2306 08/21/24 0916   08/20/24 0400  ceFEPIme  (MAXIPIME ) 2 g in sodium chloride  0.9 % 100 mL IVPB  Status:  Discontinued        2 g 200 mL/hr over 30 Minutes Intravenous Every 8 hours 08/19/24 2321 08/21/24 0935   08/19/24 1915  aztreonam (AZACTAM) 2 g in sodium chloride  0.9 % 100 mL IVPB  Status:  Discontinued        2 g 200 mL/hr over 30 Minutes Intravenous  Once 08/19/24 1900 08/19/24 1904   08/19/24 1915   metroNIDAZOLE  (FLAGYL ) IVPB 500 mg        500 mg 100 mL/hr over 60 Minutes Intravenous  Once 08/19/24 1900 08/19/24 2039   08/19/24 1915  vancomycin  (VANCOCIN ) IVPB 1000 mg/200 mL premix        1,000 mg 200 mL/hr over 60 Minutes Intravenous  Once 08/19/24 1900 08/19/24 2222   08/19/24 1915  ceFEPIme  (MAXIPIME ) 2 g in sodium chloride  0.9 % 100 mL IVPB        2 g 200 mL/hr over 30 Minutes Intravenous  Once 08/19/24 1905 08/19/24 1954       Subjective:  No complaints  Objective: Vitals:   09/02/24 0500 09/02/24 0529 09/02/24 1025 09/02/24 1251  BP:  97/67  (!) 99/52  Pulse:  69  65  Resp:  18  18  Temp:  98.1 F (36.7 C)  98.4 F (36.9 C)  TempSrc:  Axillary    SpO2:  97%  99%  Weight: 123.1 kg  Height:   5' 10 (1.778 m)     Intake/Output Summary (Last 24 hours) at 09/02/2024 1349 Last data filed at 09/02/2024 1108 Gross per 24 hour  Intake --  Output 800 ml  Net -800 ml   Filed Weights   08/31/24 0500 09/01/24 0635 09/02/24 0500  Weight: 124.7 kg 120 kg 123.1 kg    Examination:  General: No acute distress. Cardiovascular: RRR Lungs: unlabored Neurological: Awake. Moves all extremities 4 with equal strength. Cranial nerves II through XII grossly intact. Extremities: No clubbing or cyanosis. No edema.   Data Reviewed: I have personally reviewed following labs and imaging studies  CBC: Recent Labs  Lab 08/27/24 0652 09/01/24 0601 09/02/24 0738  WBC 6.1 5.1 4.8  NEUTROABS  --  2.5 2.6  HGB 13.3 13.8 14.1  HCT 42.7 43.1 44.3  MCV 93.8 91.9 92.9  PLT 128* 256 267    Basic Metabolic Panel: Recent Labs  Lab 08/27/24 0652 08/29/24 0505 09/01/24 0601 09/02/24 0738  NA 140 139 143 142  K 4.5 3.9 3.9 3.8  CL 104 102 107 107  CO2 28 26 25 25   GLUCOSE 100* 94 98 109*  BUN 22 27* 31* 31*  CREATININE 0.90 1.03 1.08 1.05  CALCIUM  9.2 9.3 9.3 9.2  MG  --   --  2.5* 2.8*  PHOS  --   --  3.0 3.0    GFR: Estimated Creatinine Clearance: 77.5 mL/min (by  C-G formula based on SCr of 1.05 mg/dL).  Liver Function Tests: Recent Labs  Lab 08/27/24 0652 08/29/24 0505 09/01/24 0601 09/02/24 0738  AST 36 32 34 35  ALT 17 11 9 14   ALKPHOS 76 76 75 74  BILITOT 1.6* 1.7* 1.9* 1.6*  PROT 6.9 6.8 6.8 7.1  ALBUMIN  3.3* 3.4* 3.5 3.4*    CBG: Recent Labs  Lab 08/29/24 2335 08/30/24 0345 08/30/24 0746 08/30/24 1121 08/30/24 1615  GLUCAP 98 101* 96 106* 108*     Recent Results (from the past 240 hours)  Resp panel by RT-PCR (RSV, Flu Johna Kearl&B, Covid) Anterior Nasal Swab     Status: None   Collection Time: 08/23/24  5:23 PM   Specimen: Anterior Nasal Swab  Result Value Ref Range Status   SARS Coronavirus 2 by RT PCR NEGATIVE NEGATIVE Final    Comment: (NOTE) SARS-CoV-2 target nucleic acids are NOT DETECTED.  The SARS-CoV-2 RNA is generally detectable in upper respiratory specimens during the acute phase of infection. The lowest concentration of SARS-CoV-2 viral copies this assay can detect is 138 copies/mL. Britney Captain negative result does not preclude SARS-Cov-2 infection and should not be used as the sole basis for treatment or other patient management decisions. Gerard Bonus negative result may occur with  improper specimen collection/handling, submission of specimen other than nasopharyngeal swab, presence of viral mutation(s) within the areas targeted by this assay, and inadequate number of viral copies(<138 copies/mL). Lavonne Cass negative result must be combined with clinical observations, patient history, and epidemiological information. The expected result is Negative.  Fact Sheet for Patients:  bloggercourse.com  Fact Sheet for Healthcare Providers:  seriousbroker.it  This test is no t yet approved or cleared by the [place]  FDA and  has been authorized for detection and/or diagnosis of SARS-CoV-2 by FDA under an Emergency Use Authorization (EUA). This EUA will remain  in effect (meaning this test  can be used) for the duration of the COVID-19 declaration under Section 564(b)(1) of the Act, 21 U.S.C.section 360bbb-3(b)(1), unless the authorization is terminated  or revoked sooner.       Influenza Jnya Brossard by PCR NEGATIVE NEGATIVE Final   Influenza B by PCR NEGATIVE NEGATIVE Final    Comment: (NOTE) The Xpert Xpress SARS-CoV-2/FLU/RSV plus assay is intended as an aid in the diagnosis of influenza from Nasopharyngeal swab specimens and should not be used as Cleda Imel sole basis for treatment. Nasal washings and aspirates are unacceptable for Xpert Xpress SARS-CoV-2/FLU/RSV testing.  Fact Sheet for Patients: bloggercourse.com  Fact Sheet for Healthcare Providers: seriousbroker.it  This test is not yet approved or cleared by the [place]  FDA and has been authorized for detection and/or diagnosis of SARS-CoV-2 by FDA under an Emergency Use Authorization (EUA). This EUA will remain in effect (meaning this test can be used) for the duration of the COVID-19 declaration under Section 564(b)(1) of the Act, 21 U.S.C. section 360bbb-3(b)(1), unless the authorization is terminated or revoked.     Resp Syncytial Virus by PCR NEGATIVE NEGATIVE Final    Comment: (NOTE) Fact Sheet for Patients: bloggercourse.com  Fact Sheet for Healthcare Providers: seriousbroker.it  This test is not yet approved or cleared by the [place]  FDA and has been authorized for detection and/or diagnosis of SARS-CoV-2 by FDA under an Emergency Use Authorization (EUA). This EUA will remain in effect (meaning this test can be used) for the duration of the COVID-19 declaration under Section 564(b)(1) of the Act, 21 U.S.C. section 360bbb-3(b)(1), unless the authorization is terminated or revoked.  Performed at [facility], [address], [place], [place] [address]          Radiology  Studies: No results found.      Scheduled Meds:  carbidopa -levodopa   1 tablet Oral 3 times per day   Chlorhexidine  Gluconate Cloth  6 each Topical Daily   feeding supplement  237 mL Oral BID BM   melatonin  3 mg Oral QHS   mouth rinse  15 mL Mouth Rinse 4 times per day   QUEtiapine   12.5 mg Oral QHS   rivaroxaban   20 mg Oral Q supper   tamsulosin   0.4 mg Oral Daily   Continuous Infusions:   LOS: 14 days    Time spent: over 30 min     Meliton Monte, MD [facility]   To contact the attending provider between 7A-7P or the covering provider during after hours 7P-7A, please log into the web site [email or web address] and access using universal [place] password for that web site. If you do not have the password, please call the hospital operator.  09/02/2024, 1:49 PM    "

## 2024-09-02 NOTE — Progress Notes (Signed)
" °   09/02/24 2312  BiPAP/CPAP/SIPAP  BiPAP/CPAP/SIPAP Pt Type Adult  BiPAP/CPAP/SIPAP Resmed  Mask Type Full face mask  Dentures removed? Not applicable  Mask Size Large  Respiratory Rate 20 breaths/min  EPAP 10 cmH2O  FiO2 (%) 21 %  Patient Home Machine No  Patient Home Mask No  Patient Home Tubing No  Auto Titrate No  Device Plugged into RED Power Outlet Yes    "

## 2024-09-02 NOTE — Plan of Care (Signed)

## 2024-09-02 NOTE — TOC Progression Note (Signed)
 Transition of Care [facility]) - Progression Note    Patient Details  Name: Richard Sandoval MRN: [id number] Date of Birth: 06-Aug-1945  [facility] CM/SW Contact  Heather DELENA Saltness, LCSW Phone Number: 09/02/2024, 2:06 PM  Clinical Narrative:    CSW spoke with pt's spouse, Nicoli Nardozzi [phone], via phone call to discuss SNF bed availability and obtain facility preference. CSW provided pt's spouse with list of facilities with available beds, including name of facility, location, and Medicare Star-Ratings. Pt's spouse reports she is leaning towards [facility], however is trying to schedule a tour of facility prior to making final decision. TOC will continue to follow.  Medicare Star-Ratings  [facility] [address] [place], [place] [address] [phone] Overall rating ? Much below average  [facility] [address] [place], [place] [address] [phone] Overall rating ?? Much below average  [facility] [address] [place], [place] [address] [phone] Overall rating ????? Much below average   Expected Discharge Plan: Skilled Nursing Facility Barriers to Discharge: Continued Medical Work up   Expected Discharge Plan and Services In-house Referral: NA Discharge Planning Services: CM Consult Post Acute Care Choice: Durable Medical Equipment Living arrangements for the past 2 months: Single Family Home                 DME Arranged: N/A DME Agency: NA       HH Arranged: NA HH Agency: NA         Social Drivers of Health (SDOH) Interventions SDOH Screenings   Food Insecurity: Patient Unable To Answer (08/20/2024)  Housing: Patient Unable To Answer (08/20/2024)  Transportation Needs: Patient Unable To Answer (08/20/2024)  Utilities: Patient Unable To Answer (08/20/2024)  Social Connections: Patient Unable To Answer (08/20/2024)  Tobacco Use:  Low Risk (08/19/2024)    Readmission Risk Interventions    08/22/2024    3:18 PM  Readmission Risk Prevention Plan  Post Dischage Appt Complete  Medication Screening Complete  Transportation Screening Complete   Signed: Heather Saltness, MSW, LCSW Clinical Social Worker Inpatient Care Management 09/02/2024 2:10 PM

## 2024-09-02 NOTE — Plan of Care (Signed)
   Problem: Education: Goal: Knowledge of General Education information will improve Description: Including pain rating scale, medication(s)/side effects and non-pharmacologic comfort measures Outcome: Not Progressing

## 2024-09-02 NOTE — Progress Notes (Signed)
 "                                                                                                                                                                                                          Daily Progress Note   Patient Name: Richard Sandoval       Date: 09/02/2024 DOB: 1946-02-17  Age: 79 y.o. MRN#: [id number] Attending Physician: Perri DELENA Meliton Mickey., * Primary Care Physician: [name], MD Admit Date: 08/19/2024  Reason for Consultation/Follow-up: Establishing goals of care agitation/encephalopathy management, feeding decisions, disposition planning   Subjective:  Awake alert, resting in bed, no acute distress, not agitated at present. PT OT continuing their efforts, episodic confusion/agitation continues.   Length of Stay: 14  Current Medications: Scheduled Meds:   carbidopa -levodopa   1 tablet Oral 3 times per day   Chlorhexidine  Gluconate Cloth  6 each Topical Daily   feeding supplement  237 mL Oral BID BM   melatonin  3 mg Oral QHS   mouth rinse  15 mL Mouth Rinse 4 times per day   QUEtiapine   12.5 mg Oral QHS   rivaroxaban   20 mg Oral Q supper   tamsulosin   0.4 mg Oral Daily    Continuous Infusions:   PRN Meds: acetaminophen  **OR** acetaminophen , haloperidol  lactate, sorbitol   Physical Exam         Awake alert No distress Not agitated Regular work of breathing Appears with generalized weakness Has foley No edema S 1 S 2  Vital Signs: BP (!) 99/52 (BP Location: Left Arm)   Pulse 65   Temp 98.4 F (36.9 C)   Resp 18   Ht 5' 10 (1.778 m)   Wt 123.1 kg   SpO2 99%   BMI 38.94 kg/m  SpO2: SpO2: 99 % O2 Device: O2 Device: Room Air O2 Flow Rate:    Intake/output summary:  Intake/Output Summary (Last 24 hours) at 09/02/2024 1302 Last data filed at 09/02/2024 1108 Gross per 24 hour  Intake --  Output 800 ml  Net -800 ml   LBM: Last BM Date : 09/01/24 Baseline Weight: Weight: 134.3 kg Most recent weight: Weight: 123.1 kg        Palliative Assessment/Data:      Patient Active Problem List   Diagnosis Date Noted   Altered mental status 08/24/2024   Multifocal pneumonia 08/20/2024   Sepsis (HCC) 08/19/2024   Parkinson's disease (HCC) 08/19/2024   Hypothermia 08/19/2024   Acute metabolic encephalopathy 08/19/2024   History of pulmonary embolism 08/19/2024   Thrombocytopenia 08/19/2024   Chronic  combined systolic and diastolic heart failure (HCC) 09/06/2021   CAD in native artery 09/06/2021   History of colonic polyps 03/04/2019   Lumbar stenosis with neurogenic claudication 12/07/2017   Right heart failure (HCC) 08/27/2017   Pulmonary hypertension (HCC) 06/04/2017   GERD (gastroesophageal reflux disease) 02/21/2016   Bilateral pulmonary embolism (HCC) 02/20/2016   Hypokalemia 01/25/2014   Pancreatitis, gallstone 01/25/2014   Morbid obesity (HCC)    Pancreatitis 01/05/2014   Abnormal transaminases 01/05/2014   Dyspnea 12/15/2013   Upper airway cough syndrome 09/27/2013   Cellulitis and abscess 05/10/2012   Morbid (severe) obesity due to excess calories (HCC) 09/24/2006   DEPRESSIVE DISORDER, NOS 09/24/2006   BACK PAIN, LOW 09/24/2006   OSA (obstructive sleep apnea) 09/24/2006    Palliative Care Assessment & Plan   Patient Profile: Assessment  79 year old male with Parkinson disease, prior PE/DVT, chronic HFrEF, OSA, and recent influenza pneumonia, now with multifactorial encephalopathy superimposed on probable Parkinson disease-related dementia, manifested by agitation, functional decline, and inability to maintain nutrition independently. Course complicated by hypothermia, persistent cognitive impairment despite treatment of presumed infection, EEG with generalized cerebral dysfunction, and progressive decline reported by family predating this admission. Overall prognosis is guarded with high risk for continued cognitive and functional deterioration.  Palliative Care  consulted for symptom management, goals of care clarification, and support with complex decision-making, including antipsychotic use and feeding considerations.  Plan 1. Goals of Care / Medical Decision-Making  The patient does not have capacity to participate in complex medical decisions. His wife is serving as social research officer, government. At this time, goals remain oriented toward ongoing medical treatment, with a preference for discharge to a skilled nursing facility rather than return home. Transition of Care is coordinating with the wife to support a safe and appropriate disposition.  Palliative Medicine will continue longitudinal involvement to help set expectations regarding anticipated clinical course, nutrition options (including feeding tube considerations), and evolving long-term goals.  2. Agitation / Encephalopathy  Olanzapine  has been discontinued due to concern for dopamine D2 receptor antagonism and potential exacerbation of parkinsonian motor symptoms; this agent is generally avoided in Parkinson disease with dementia.  The patient remains on quetiapine , which has lower D2 receptor affinity and is typically better tolerated when antipsychotic treatment is required in Parkinson disease.  Haloperidol  may be used only on an as-needed basis for severe agitation posing immediate risk to the patient or staff, with cautious and limited use given the risk of extrapyramidal side effects and worsening parkinsonism.  Continue emphasis on non-pharmacologic delirium precautions, including frequent reorientation, sleep-wake cycle preservation, avoidance of restraints when possible, and minimization of deliriogenic medications.  3. Nutrition / Feeding  The nasogastric tube was removed on 2/2. Given ongoing poor oral intake and inability to self-feed, Nutrition has recommended Cortrak tube reinsertion.  The patients wife currently prefers continued careful hand-assisted oral feeding. Risks and  benefits of feeding tube reinsertion were reviewed, including aspiration risk, procedural discomfort, and limited likelihood of meaningful cognitive or functional improvement.  Plan to support comfort-focused oral intake as tolerated, with close attention to oral hygiene, and to revisit feeding decisions as goals of care evolve.  4. Prognosis  The patient is not felt to be imminently terminal; however, prognosis remains guarded in the context of progressive neurodegenerative disease, persistent encephalopathy, and significant functional dependence. There is a high likelihood of further decline and recurrent hospitalizations despite ongoing medical interventions.  5. Disposition  Plan remains discharge to a skilled nursing facility in accordance with the  wifes preference. Transition of Care is assisting with placement. Palliative Medicine will continue to follow for symptom management and ongoing goals-of-care support.  Goals of Care and Additional Recommendations: Limitations on Scope of Treatment: Full Scope Treatment  Code Status:    Code Status Orders  (From admission, onward)           Start     Ordered   08/19/24 2300  Full code  Continuous       Question:  By:  Answer:  Consent: discussion documented in EHR   08/19/24 2300           Code Status History     Date Active Date Inactive Code Status Order ID Comments User Context   09/11/2021 1015 09/11/2021 1911 Full Code [id number]  Wendel Lurena POUR, MD Inpatient   12/07/2017 1752 12/11/2017 1906 Full Code [id number]  Colon Shove, MD Inpatient   02/20/2016 2324 02/24/2016 1723 Full Code [id number]  Starleen Meter, MD ED   01/25/2014 1516 01/27/2014 1355 Full Code [id number]  Eletha Boas, MD Inpatient   01/21/2014 1425 01/25/2014 1516 Full Code [id number]  Madelyne Owen LABOR, MD Inpatient   01/05/2014 1835 01/11/2014 1751 Full Code [id number]  Madelyne Owen LABOR, MD Inpatient   05/10/2012 2125 05/12/2012 1531 Full Code [id number]  Gatha Arnette BRAVO, RN Inpatient       Prognosis:  Unable to determine  Discharge Planning: Skilled Nursing Facility for rehab with Palliative care service follow-up  Care plan was discussed with  IDT  Thank you for allowing the Palliative Medicine Team to assist in the care of this patient.  I personally spent a total of 25 minutes in the care of the patient today including preparing to see the patient, getting/reviewing separately obtained history, performing a medically appropriate exam/evaluation, counseling and educating, referring and communicating with other health care professionals, and documenting clinical information in the EHR.     Greater than 50%  of this time was spent counseling and coordinating care related to the above assessment and plan.  Lonia Serve, MD  Please contact Palliative Medicine Team phone at [phone] for questions and concerns.       "

## 2024-12-08 ENCOUNTER — Ambulatory Visit: Admitting: Neurology

## 2024-12-22 ENCOUNTER — Ambulatory Visit: Admitting: Neurology
